# Patient Record
Sex: Female | Born: 1935 | Hispanic: No | Smoking: Never smoker
Health system: Southern US, Community
[De-identification: ages and names within clinical notes are randomized; demographics above are authoritative.]

## PROBLEM LIST (undated history)

## (undated) DIAGNOSIS — T82198A Other mechanical complication of other cardiac electronic device, initial encounter: Secondary | ICD-10-CM

## (undated) DIAGNOSIS — M1990 Unspecified osteoarthritis, unspecified site: Secondary | ICD-10-CM

## (undated) DIAGNOSIS — E785 Hyperlipidemia, unspecified: Secondary | ICD-10-CM

## (undated) DIAGNOSIS — I482 Chronic atrial fibrillation, unspecified: Secondary | ICD-10-CM

## (undated) DIAGNOSIS — Z95810 Presence of automatic (implantable) cardiac defibrillator: Secondary | ICD-10-CM

## (undated) DIAGNOSIS — I428 Other cardiomyopathies: Secondary | ICD-10-CM

## (undated) DIAGNOSIS — I2510 Atherosclerotic heart disease of native coronary artery without angina pectoris: Secondary | ICD-10-CM

## (undated) DIAGNOSIS — I509 Heart failure, unspecified: Secondary | ICD-10-CM

## (undated) DIAGNOSIS — T7840XA Allergy, unspecified, initial encounter: Secondary | ICD-10-CM

## (undated) HISTORY — DX: Presence of automatic (implantable) cardiac defibrillator: Z95.810

## (undated) HISTORY — DX: Hyperlipidemia, unspecified: E78.5

## (undated) HISTORY — PX: OTHER SURGICAL HISTORY: SHX169

## (undated) HISTORY — DX: Allergy, unspecified, initial encounter: T78.40XA

## (undated) HISTORY — DX: Heart failure, unspecified: I50.9

## (undated) HISTORY — DX: Other mechanical complication of other cardiac electronic device, initial encounter: T82.198A

## (undated) HISTORY — DX: Chronic atrial fibrillation, unspecified: I48.20

## (undated) HISTORY — PX: REPLACEMENT TOTAL KNEE: SUR1224

## (undated) HISTORY — DX: Other cardiomyopathies: I42.8

## (undated) HISTORY — DX: Unspecified osteoarthritis, unspecified site: M19.90

## (undated) HISTORY — DX: Atherosclerotic heart disease of native coronary artery without angina pectoris: I25.10

---

## 2005-12-31 ENCOUNTER — Other Ambulatory Visit: Payer: Self-pay

## 2005-12-31 ENCOUNTER — Inpatient Hospital Stay: Payer: Self-pay | Admitting: Internal Medicine

## 2006-01-04 ENCOUNTER — Ambulatory Visit: Payer: Self-pay | Admitting: Internal Medicine

## 2006-01-04 ENCOUNTER — Inpatient Hospital Stay (HOSPITAL_COMMUNITY): Admission: AD | Admit: 2006-01-04 | Discharge: 2006-01-06 | Payer: Self-pay | Admitting: Internal Medicine

## 2006-01-05 HISTORY — PX: OTHER SURGICAL HISTORY: SHX169

## 2006-01-29 ENCOUNTER — Ambulatory Visit: Payer: Self-pay

## 2006-03-28 ENCOUNTER — Other Ambulatory Visit: Payer: Self-pay

## 2006-03-28 ENCOUNTER — Emergency Department: Payer: Self-pay | Admitting: Emergency Medicine

## 2006-04-17 ENCOUNTER — Ambulatory Visit: Payer: Self-pay | Admitting: Internal Medicine

## 2006-07-31 ENCOUNTER — Ambulatory Visit: Payer: Self-pay | Admitting: Internal Medicine

## 2006-10-30 ENCOUNTER — Ambulatory Visit: Payer: Self-pay | Admitting: Internal Medicine

## 2007-01-29 ENCOUNTER — Ambulatory Visit: Payer: Self-pay | Admitting: Internal Medicine

## 2007-04-30 ENCOUNTER — Ambulatory Visit: Payer: Self-pay | Admitting: Internal Medicine

## 2007-06-06 ENCOUNTER — Ambulatory Visit: Payer: Self-pay | Admitting: Internal Medicine

## 2007-06-25 ENCOUNTER — Ambulatory Visit: Payer: Self-pay | Admitting: Gastroenterology

## 2007-07-30 ENCOUNTER — Ambulatory Visit: Payer: Self-pay | Admitting: Internal Medicine

## 2007-07-31 ENCOUNTER — Ambulatory Visit: Payer: Self-pay | Admitting: General Surgery

## 2007-08-08 ENCOUNTER — Ambulatory Visit: Payer: Self-pay | Admitting: Cardiology

## 2007-08-14 ENCOUNTER — Inpatient Hospital Stay: Payer: Self-pay | Admitting: General Surgery

## 2007-10-29 ENCOUNTER — Ambulatory Visit: Payer: Self-pay | Admitting: Internal Medicine

## 2008-01-01 ENCOUNTER — Inpatient Hospital Stay: Payer: Self-pay | Admitting: Cardiology

## 2008-01-01 ENCOUNTER — Other Ambulatory Visit: Payer: Self-pay

## 2008-01-28 ENCOUNTER — Ambulatory Visit: Payer: Self-pay | Admitting: Internal Medicine

## 2008-04-27 ENCOUNTER — Ambulatory Visit: Payer: Self-pay | Admitting: Internal Medicine

## 2008-08-07 ENCOUNTER — Ambulatory Visit: Payer: Self-pay | Admitting: Internal Medicine

## 2008-11-03 ENCOUNTER — Ambulatory Visit: Payer: Self-pay | Admitting: Internal Medicine

## 2008-11-03 ENCOUNTER — Ambulatory Visit: Payer: Self-pay | Admitting: Unknown Physician Specialty

## 2008-11-24 ENCOUNTER — Encounter: Payer: Self-pay | Admitting: Internal Medicine

## 2009-02-02 ENCOUNTER — Ambulatory Visit: Payer: Self-pay | Admitting: Internal Medicine

## 2009-02-09 ENCOUNTER — Ambulatory Visit: Payer: Self-pay | Admitting: General Practice

## 2009-02-12 ENCOUNTER — Encounter: Payer: Self-pay | Admitting: Internal Medicine

## 2009-02-24 ENCOUNTER — Ambulatory Visit: Payer: Self-pay | Admitting: General Practice

## 2009-05-10 ENCOUNTER — Ambulatory Visit: Payer: Self-pay | Admitting: Internal Medicine

## 2009-05-10 DIAGNOSIS — I4891 Unspecified atrial fibrillation: Secondary | ICD-10-CM | POA: Insufficient documentation

## 2009-05-10 DIAGNOSIS — I420 Dilated cardiomyopathy: Secondary | ICD-10-CM

## 2009-05-10 DIAGNOSIS — Z95810 Presence of automatic (implantable) cardiac defibrillator: Secondary | ICD-10-CM | POA: Insufficient documentation

## 2009-05-10 DIAGNOSIS — I5022 Chronic systolic (congestive) heart failure: Secondary | ICD-10-CM

## 2009-05-10 HISTORY — DX: Presence of automatic (implantable) cardiac defibrillator: Z95.810

## 2009-08-10 ENCOUNTER — Encounter: Payer: Self-pay | Admitting: Internal Medicine

## 2009-08-10 ENCOUNTER — Ambulatory Visit: Payer: Self-pay | Admitting: Internal Medicine

## 2009-08-23 ENCOUNTER — Encounter: Payer: Self-pay | Admitting: Internal Medicine

## 2009-11-09 ENCOUNTER — Ambulatory Visit: Payer: Self-pay | Admitting: Internal Medicine

## 2009-11-23 ENCOUNTER — Encounter: Payer: Self-pay | Admitting: Internal Medicine

## 2010-02-08 ENCOUNTER — Ambulatory Visit: Payer: Self-pay | Admitting: Internal Medicine

## 2010-02-24 ENCOUNTER — Encounter: Payer: Self-pay | Admitting: Internal Medicine

## 2010-05-13 ENCOUNTER — Ambulatory Visit: Payer: Self-pay | Admitting: Internal Medicine

## 2010-05-13 ENCOUNTER — Encounter: Payer: Self-pay | Admitting: Internal Medicine

## 2010-08-11 ENCOUNTER — Ambulatory Visit: Payer: Self-pay | Admitting: Internal Medicine

## 2010-09-06 ENCOUNTER — Encounter (INDEPENDENT_AMBULATORY_CARE_PROVIDER_SITE_OTHER): Payer: Self-pay | Admitting: *Deleted

## 2010-09-08 ENCOUNTER — Ambulatory Visit
Admission: RE | Admit: 2010-09-08 | Discharge: 2010-09-08 | Payer: Self-pay | Source: Home / Self Care | Attending: Internal Medicine | Admitting: Internal Medicine

## 2010-09-09 ENCOUNTER — Encounter: Payer: Self-pay | Admitting: Internal Medicine

## 2010-09-27 NOTE — Cardiovascular Report (Signed)
Summary: Office Visit Remote   Office Visit Remote   Imported By: Roderic Ovens 11/25/2009 12:03:00  _____________________________________________________________________  External Attachment:    Type:   Image     Comment:   External Document

## 2010-09-27 NOTE — Letter (Signed)
Summary: Remote Device Check  [facility], Main Office  [address]   [place], [place] [address]   Phone: [phone]  Fax: [phone]     February 24, 2010 MRN: [id number]   Barbara Blackburn [address] [place], [place]  [address]   Dear [name],   Your remote transmission was recieved and reviewed by your physician.  All diagnostics were within normal limits for you.   __X____Your next office visit is scheduled for:  September 2011 with Dr [name].                             Please call our office to schedule an appointment.    Sincerely,  Vella Kohler

## 2010-09-27 NOTE — Cardiovascular Report (Signed)
Summary: Office Visit Remote   Office Visit Remote   Imported By: Roderic Ovens 02/25/2010 16:34:06  _____________________________________________________________________  External Attachment:    Type:   Image     Comment:   External Document

## 2010-09-27 NOTE — Letter (Signed)
Summary: Remote Device Check  [facility], Main Office  [address]   [place], [place] [address]   Phone: [phone]  Fax: [phone]     November 23, 2009 MRN: [id number]   Barbara Blackburn [address] [place], [place]  [address]   Dear [name],   Your remote transmission was recieved and reviewed by your physician.  All diagnostics were within normal limits for you.  __X___Your next transmission is scheduled for:   February 08, 2010.  Please transmit at any time this day.  If you have a wireless device your transmission will be sent automatically.      Sincerely,  Proofreader

## 2010-09-27 NOTE — Assessment & Plan Note (Signed)
Summary: 1 yr   Visit Type:  Follow-up Primary Provider:  Loraine Leriche  Crissman,M.D.  CC:  c/o feel very weak.[name]  History of Present Illness: [name] returns today for ICD followup.  She is a 77 yo woman with an ICM/DCM and EF 25%.  She is s/p PCI stent.  She underwent prophylactic ICD implant in 2007.  She has ongoing pain in her lower back.  She denies c/p, palpitations and sob.  She has had no intercurrent ICD therapies.  She is following a low sodium diet.  Current Medications (verified): 1)  Metformin Hcl 500 Mg Tabs (Metformin Hcl) .... Three Times A Day 2)  Centrum Silver  Tabs (Multiple Vitamins-Minerals) .... Once Daily 3)  Crestor 10 Mg Tabs (Rosuvastatin Calcium) .... Take One Tablet By Mouth Daily. 4)  Exforge 10-320 Mg Tabs (Amlodipine Besylate-Valsartan) .... Once Daily 5)  Coumadin 2 Mg Tabs (Warfarin Sodium) .... Take Tues and Thurs. 6)  Fish Oil 1000 Mg Caps (Omega-3 Fatty Acids) .[name].. 1 Daily 7)  Lopressor 100 Mg Tabs (Metoprolol Tartrate) .... Take 1 By Mouth Two Times A Day 8)  Aspirin 81 Mg Tbec (Aspirin) .... Take One Tablet By Mouth Daily 9)  Hydrochlorothiazide 25 Mg Tabs (Hydrochlorothiazide) .... Take One Tablet By Mouth Daily. 10)  Klor-Con 20 Meq Pack (Potassium Chloride) .... Take 1 By Mouth Once Daily 11)  Coumadin 4 Mg Tabs (Warfarin Sodium) .... Mon. Wed. Fri. Sat. and Sunday  Allergies (verified): 1)  ! * Clonazepam 2)  ! Prozac 3)  ! Lisinopril 4)  ! * Ambien  Past History:  Past Medical History: Last updated: 05/10/2009 1. Medtronic dual-chamber ICD (01/05/2006) 2. Nonischemic cardiomyopathy.  3. Coronary artery disease, now on Plavix.  4. Chronic atrial fibrillation, on Coumadin.  5. Congestive heart failure class II.  6. Severe degenerative joint disease. 7. Dyslipidemia  Review of Systems  The patient denies chest pain, syncope, dyspnea on exertion, and peripheral edema.    Vital Signs:  Patient profile:   77 year old female Height:       62 inches Weight:      202 pounds BMI:     37 .08 Pulse rate:   64 / minute BP sitting:   124 / 76  (left arm) Cuff size:   large  Vitals Entered By: Bishop Dublin, CMA (May 13, 2010 10:01 AM)  Physical Exam  General:  Elderly appearing woman NAD Head:  normocephalic and atraumatic Eyes:  PERRLA/EOM intact; conjunctiva and lids normal. Mouth:  Teeth, gums and palate normal. Oral mucosa normal. Neck:  Neck supple, no JVD. No masses, thyromegaly or abnormal cervical nodes. Chest Wall:  Well healed ICD incision. Lungs:  Clear bilaterally with no wheezes, rales, or rhonchi or increased work of breathing. Heart:  IRIR with normal S1 and S2.  PMI is enlarged and laterally displaced.  No murmurs. Abdomen:  Bowel sounds positive; abdomen soft and non-tender without masses, organomegaly, or hernias noted. No hepatosplenomegaly. Msk:  Back normal, normal gait. Muscle strength and tone decreased in the lower extremities. Pulses:  pulses normal in all 4 extremities Extremities:  No clubbing or cyanosis.  No peripheral edema. Neurologic:  Alert and oriented x 3.    ICD Specifications Following MD:  Lewayne Bunting, MD     ICD Vendor:  Medtronic     ICD Model Number:  7278     ICD Serial Number:  [id number] ICD DOI:  01/05/2006     ICD Implanting MD:  Lewayne Bunting,  MD  Lead 1:    Location: RA     DOI: 01/05/2006     Model #: 2595     Serial #: [id number]     Status: active Lead 2:    Location: RV     DOI: 01/05/2006     Model #: [id number]     Serial #: [id number]     Status: active  Indications::  NICM   ICD Follow Up Remote Check?  No Battery Voltage:  2.65 V     Charge Time:  11.08 seconds     Underlying rhythm:  A-fib ICD Dependent:  No       ICD Device Measurements Atrium:  Amplitude: 0.5 mV, Impedance: 424 ohms,  Right Ventricle:  Amplitude: 3.7 mV, Impedance: 544 ohms, Threshold: 1.0 V at 0.1 msec Shock Impedance: 44/52 ohms   Episodes Percent Mode Switch:  100%     Coumadin:   Yes Shock:  0     ATP:  0     Nonsustained:  0     Atrial Pacing:  0.2%     Ventricular Pacing:  9.0%  Brady Parameters Mode DDI     Lower Rate Limit:  50      Tachy Zones VF:  200     VT:  250     VT1:  171     Next Remote Date:  08/11/2010     Next Cardiology Appt Due:  04/29/2011 Tech Comments:  No parameter changes.  A-fib chronic, + coumadin.  R-waves chronic @ 3.40mV.  6949 lead stable, SIC  0.  Updated letter and magnet given to the patient.  Alert tones demonstrated.   [facility]link transmissions every 3 months.  ROV 1 year with Dr. [name] in [place]. Altha Harm, LPN  May 13, 2010 10:24 AM  MD Comments:  Agree with above.  Impression & Recommendations:  Problem # 1:  AUTOMATIC IMPLANTABLE CARDIAC DEFIBRILLATOR SITU (ICD-V45.02) Her device is working normally and she has had no ICD therapies.  Will follow.  Problem # 2:  CHRONIC SYSTOLIC HEART FAILURE (ICD-428.22) Her symptoms remain class 2.  Continue meds as below and maintain a low sodium diet. Her updated medication list for this problem includes:    Exforge 10-320 Mg Tabs (Amlodipine besylate-valsartan) ..... Once daily    Coumadin 2 Mg Tabs (Warfarin sodium) .[name]... Take tues and thurs.    Lopressor 100 Mg Tabs (Metoprolol tartrate) .[name]... Take 1 by mouth two times a day    Aspirin 81 Mg Tbec (Aspirin) .[name]... Take one tablet by mouth daily    Hydrochlorothiazide 25 Mg Tabs (Hydrochlorothiazide) .[name]... Take one tablet by mouth daily.    Coumadin 4 Mg Tabs (Warfarin sodium) ..... Mon. wed. fri. sat. and sunday  Problem # 3:  CARDIOMYOPATHY, ISCHEMIC (ICD-414.8) She denies anginal symptoms.  Continue meds as below. Her updated medication list for this problem includes:    Exforge 10-320 Mg Tabs (Amlodipine besylate-valsartan) ..... Once daily    Coumadin 2 Mg Tabs (Warfarin sodium) .[name]... Take tues and thurs.    Lopressor 100 Mg Tabs (Metoprolol tartrate) .[name]... Take 1 by mouth two times a day    Aspirin 81 Mg Tbec (Aspirin)  .[name]... Take one tablet by mouth daily    Hydrochlorothiazide 25 Mg Tabs (Hydrochlorothiazide) .[name]... Take one tablet by mouth daily.    Coumadin 4 Mg Tabs (Warfarin sodium) ..... Mon. wed. fri. sat. and sunday

## 2010-09-29 NOTE — Cardiovascular Report (Signed)
Summary: Office Visit Remote   Office Visit Remote   Imported By: Roderic Ovens 09/09/2010 10:53:04  _____________________________________________________________________  External Attachment:    Type:   Image     Comment:   External Document

## 2010-09-29 NOTE — Letter (Signed)
Summary: Remote Device Check  [facility], Main Office  [address]   [place], [place] [address]   Phone: [phone]  Fax: [phone]     September 06, 2010 MRN: [id number]   Barbara Blackburn [address] [place], [place]  [address]   Dear [name],   Your remote transmission was recieved and reviewed by your physician.  All diagnostics were within normal limits for you.  __X___Your next transmission is scheduled for:  09-08-10 .  Please transmit at any time this day.  If you have a wireless device your transmission will be sent automatically.   Sincerely,  Vella Kohler

## 2010-10-09 ENCOUNTER — Encounter (INDEPENDENT_AMBULATORY_CARE_PROVIDER_SITE_OTHER): Payer: Self-pay | Admitting: *Deleted

## 2010-10-17 ENCOUNTER — Encounter: Payer: Self-pay | Admitting: Internal Medicine

## 2010-10-17 ENCOUNTER — Encounter (INDEPENDENT_AMBULATORY_CARE_PROVIDER_SITE_OTHER): Payer: Self-pay | Admitting: *Deleted

## 2010-10-17 ENCOUNTER — Encounter (INDEPENDENT_AMBULATORY_CARE_PROVIDER_SITE_OTHER): Payer: Self-pay

## 2010-10-17 DIAGNOSIS — I428 Other cardiomyopathies: Secondary | ICD-10-CM

## 2010-10-19 ENCOUNTER — Telehealth: Payer: Self-pay | Admitting: Internal Medicine

## 2010-10-19 NOTE — Cardiovascular Report (Signed)
Summary: Office Visit Remote   Office Visit Remote   Imported By: Roderic Ovens 10/11/2010 15:09:28  _____________________________________________________________________  External Attachment:    Type:   Image     Comment:   External Document

## 2010-10-19 NOTE — Letter (Signed)
Summary: Remote Device Check  [facility], Main Office  [address]   [place], [place] [address]   Phone: [phone]  Fax: [phone]     October 09, 2010 MRN: [id number]   Barbara Blackburn [address] [place], [place]  [address]   Dear [name],   Your remote transmission was recieved and reviewed by your physician.  All diagnostics were within normal limits for you.  __X___Your next transmission is scheduled for: 10-13-2010.  Please transmit at any time this day.  If you have a wireless device your transmission will be sent automatically.      Sincerely,  Vella Kohler

## 2010-10-25 NOTE — Letter (Signed)
Summary: Device-Delinquent Phone [facility], [facility], Main Office  [address]   [place], [place] [address]   Phone: [phone]  Fax: [phone]     October 17, 2010 MRN: [id number]   Barbara Blackburn [address] [place], [place]  [address]   Dear [name],  According to our records, you were scheduled for a device phone transmission on 10-13-2010.     We did not receive any results from this check.  If you transmitted on your scheduled day, please call us to help troubleshoot your system.  If you forgot to send your transmission, please send one upon receipt of this letter.  Thank you,   Architectural technologist [facility]

## 2010-10-25 NOTE — Progress Notes (Signed)
Summary: question about transmission  Phone Note Call from Patient Call back at Home Phone [phone]   Caller: Patient Summary of Call: Question about the letter regarding transmission Initial call taken by: Judie Grieve,  October 19, 2010 2:31 PM  Follow-up for Phone Call        spoke w/pt about carelink transmissions. pt's transmission was received. device at [facility].  will schedule ov with gt in [place] office and call pt with appt. Vella Kohler  October 19, 2010 3:20 PM

## 2010-11-08 NOTE — Cardiovascular Report (Signed)
Summary: Office Visit   Office Visit   Imported By: Roderic Ovens 11/04/2010 15:01:12  _____________________________________________________________________  External Attachment:    Type:   Image     Comment:   External Document

## 2010-11-22 ENCOUNTER — Encounter: Payer: Self-pay | Admitting: Internal Medicine

## 2010-11-22 ENCOUNTER — Encounter: Payer: Self-pay | Admitting: *Deleted

## 2010-11-22 ENCOUNTER — Ambulatory Visit (INDEPENDENT_AMBULATORY_CARE_PROVIDER_SITE_OTHER): Payer: PRIVATE HEALTH INSURANCE | Admitting: Internal Medicine

## 2010-11-22 DIAGNOSIS — I2589 Other forms of chronic ischemic heart disease: Secondary | ICD-10-CM

## 2010-11-22 DIAGNOSIS — I428 Other cardiomyopathies: Secondary | ICD-10-CM

## 2010-11-22 DIAGNOSIS — I5022 Chronic systolic (congestive) heart failure: Secondary | ICD-10-CM

## 2010-11-22 DIAGNOSIS — Z95810 Presence of automatic (implantable) cardiac defibrillator: Secondary | ICD-10-CM

## 2010-11-22 DIAGNOSIS — I4891 Unspecified atrial fibrillation: Secondary | ICD-10-CM

## 2010-11-22 MED ORDER — METOPROLOL TARTRATE 100 MG PO TABS
100.0000 mg | ORAL_TABLET | Freq: Two times a day (BID) | ORAL | Status: DC
Start: 2010-11-22 — End: 2012-05-14

## 2010-11-22 NOTE — Progress Notes (Signed)
HPI [name] returns today for ICD followup. She is a 77 yo woman with an ICM/DCM and EF 25%. She is s/p PCI stent. She underwent prophylactic ICD implant in 2007.  She denies c/p, palpitations and sob. She has had no intercurrent ICD therapies. She is following a low sodium diet. She has reached ERI.  Allergies  Allergen Reactions  . Clonazepam   . Fluoxetine Hcl   . Lisinopril   . Zolpidem Tartrate      Current Outpatient Prescriptions  Medication Sig Dispense Refill  . amLODipine-valsartan (EXFORGE) 10-320 MG per tablet Take 1 tablet by mouth daily.        [name] aspirin 81 MG EC tablet Take 81 mg by mouth daily.        . fish oil-omega-3 fatty acids 1000 MG capsule Take 1 g by mouth daily.        . furosemide (LASIX) 40 MG tablet Take 40 mg by mouth 1 dose over 46 hours.        . Multiple Vitamins-Minerals (CENTRUM SILVER ULTRA WOMENS PO) Take 1 tablet by mouth daily.       . potassium chloride SA (K-DUR,KLOR-CON) 20 MEQ tablet Take 20 mEq by mouth daily.        . rosuvastatin (CRESTOR) 10 MG tablet Take 10 mg by mouth daily.        [name] warfarin (COUMADIN) 2 MG tablet Take 2 mg by mouth as directed.        . warfarin (COUMADIN) 4 MG tablet Take 4 mg by mouth as directed.        [name] DISCONTD: metoprolol (LOPRESSOR) 100 MG tablet Take 100 mg by mouth 2 (two) times daily.       . hydrochlorothiazide 25 MG tablet Take 25 mg by mouth daily.       [name] DISCONTD: metFORMIN (GLUCOPHAGE) 500 MG tablet Take 500 mg by mouth 3 (three) times daily.          Past Medical History  Diagnosis Date  . Nonischemic cardiomyopathy   . Coronary artery disease   . Chronic atrial fibrillation   . CHF (congestive heart failure)     class 2  . Degenerative joint disease     severe  . Dyslipidemia     ROS:   All systems reviewed and negative except as noted in the HPI.   Past Surgical History  Procedure Date  . Medtronic dual-chamber icd 01/05/2006  . Pci stent      Family History  Problem  Relation Age of Onset  . Heart attack Father      History   Social History  . Marital Status: Married    Spouse Name: N/A    Number of Children: N/A  . Years of Education: N/A   Occupational History  . Not on file.   Social History Main Topics  . Smoking status: Never Smoker   . Smokeless tobacco: Never Used  . Alcohol Use: No  . Drug Use: No  . Sexually Active: Not on file   Other Topics Concern  . Not on file   Social History Narrative  . No narrative on file     BP 140/80  Pulse 65  Ht 5\' 2"  (1.575 m)  Wt 197 lb (89.359 kg)  BMI 36.03 kg/m2  Physical Exam:  Well appearing NAD HEENT: Unremarkable Neck:  No JVD, no thyromegally Lymphatics:  No adenopathy Back:  No CVA tenderness Lungs:  Clear. Well healed ICD  incision.  HEART:  Regular rate rhythm, no murmurs, no rubs, no clicks Abd:  Flat, positive bowel sounds, no organomegally, no rebound, no guarding Ext:  2 plus pulses, no edema, no cyanosis, no clubbing Skin:  No rashes no nodules Neuro:  CN II through XII intact, motor grossly intact  DEVICE  Normal device function.  See PaceArt for details.ERI.   Assess/Plan:

## 2010-11-22 NOTE — Patient Instructions (Addendum)
Pt was notified that her letter stated Pacemaker Insertion, however, she understands that this is an ICD changeout.

## 2010-11-22 NOTE — Assessment & Plan Note (Signed)
Her symptoms remain class 2. She will continue her current meds and maintain a low sodium diet.  

## 2010-11-22 NOTE — Assessment & Plan Note (Signed)
Her device has reached ERI. I have asked her to return for ICD generator change. This will be scheduled in the next few weeks. As she has a rate/sense PPM lead in place, I plan to use this if working properly in place of the rate/sense portion of her [phone] lead which has an increased risk of breaking. It is also likely that he vein will be occluded.

## 2010-11-22 NOTE — Assessment & Plan Note (Signed)
Her ventricular rates appear to be well controlled. She will continue her current meds.

## 2010-11-29 ENCOUNTER — Encounter: Payer: Self-pay | Admitting: *Deleted

## 2010-12-19 ENCOUNTER — Other Ambulatory Visit: Payer: Self-pay | Admitting: Internal Medicine

## 2010-12-19 DIAGNOSIS — Z01810 Encounter for preprocedural cardiovascular examination: Secondary | ICD-10-CM

## 2010-12-20 ENCOUNTER — Other Ambulatory Visit (INDEPENDENT_AMBULATORY_CARE_PROVIDER_SITE_OTHER): Payer: PRIVATE HEALTH INSURANCE | Admitting: *Deleted

## 2010-12-20 DIAGNOSIS — Z01810 Encounter for preprocedural cardiovascular examination: Secondary | ICD-10-CM

## 2010-12-20 DIAGNOSIS — I509 Heart failure, unspecified: Secondary | ICD-10-CM

## 2010-12-20 LAB — BASIC METABOLIC PANEL
BUN: 12 mg/dL (ref 6–23)
Calcium: 10 mg/dL (ref 8.4–10.5)
Chloride: 104 mEq/L (ref 96–112)
Sodium: 142 mEq/L (ref 135–145)

## 2010-12-21 LAB — CBC WITH DIFFERENTIAL/PLATELET
Basophils Relative: 1 % (ref 0–1)
Eosinophils Absolute: 0.1 10*3/uL (ref 0.0–0.7)
Eosinophils Relative: 1 % (ref 0–5)
HCT: 48.1 % — ABNORMAL HIGH (ref 36.0–46.0)
Lymphocytes Relative: 26 % (ref 12–46)
MCH: 29.5 pg (ref 26.0–34.0)
MCHC: 33.1 g/dL (ref 30.0–36.0)
MCV: 89.2 fL (ref 78.0–100.0)
Neutro Abs: 5.7 10*3/uL (ref 1.7–7.7)
Neutrophils Relative %: 66 % (ref 43–77)
RBC: 5.39 MIL/uL — ABNORMAL HIGH (ref 3.87–5.11)

## 2010-12-21 LAB — APTT: aPTT: 32 seconds (ref 24–37)

## 2010-12-22 ENCOUNTER — Ambulatory Visit (HOSPITAL_COMMUNITY)
Admission: RE | Admit: 2010-12-22 | Discharge: 2010-12-22 | Disposition: A | Discharge status: Home / Self Care | Payer: Medicare Other | Source: Ambulatory Visit | Attending: Internal Medicine | Admitting: Internal Medicine

## 2010-12-22 DIAGNOSIS — I509 Heart failure, unspecified: Secondary | ICD-10-CM | POA: Insufficient documentation

## 2010-12-22 DIAGNOSIS — I4891 Unspecified atrial fibrillation: Secondary | ICD-10-CM | POA: Insufficient documentation

## 2010-12-22 DIAGNOSIS — Z4502 Encounter for adjustment and management of automatic implantable cardiac defibrillator: Secondary | ICD-10-CM | POA: Insufficient documentation

## 2010-12-22 DIAGNOSIS — I2589 Other forms of chronic ischemic heart disease: Secondary | ICD-10-CM

## 2010-12-22 LAB — PROTIME-INR: INR: 1.7 — ABNORMAL HIGH (ref 0.00–1.49)

## 2010-12-22 LAB — SURGICAL PCR SCREEN: MRSA, PCR: NEGATIVE

## 2010-12-26 NOTE — Op Note (Signed)
NAME:  LOREN, VICENS             ACCOUNT NO.:  [id number]  MEDICAL RECORD NO.:  [id number]           PATIENT TYPE:  O  LOCATION:  [facility]                         FACILITY:  [facility]  PHYSICIAN:  Doylene Canning. [name], MD    DATE OF BIRTH:  May 18, 1929  DATE OF PROCEDURE:  12/22/2010 DATE OF DISCHARGE:  12/22/2010                              OPERATIVE REPORT   PROCEDURE PERFORMED:  Removal of previously implanted dual-chamber ICD which had reached ERI and insertion of a new dual-chamber ICD with capping of the rate sense portion of the initial ICD lead and utilization of the previously placed RV pacing lead for rate sensing of the device.  INTRODUCTION:  The patient is a 77 year old woman with ischemic cardiomyopathy and congestive heart failure and atrial fibrillation who is status post ICD insertion.  Her device reached ERI.  She is now referred for ICD generator change.  PROCEDURE:  After informed consent was obtained, the patient was takento the diagnostic EP lab in a fasting state.  After usual preparation and draping, intravenous fentanyl and midazolam was given for sedation. A 30 mL of lidocaine was infiltrated into the left infraclavicular region.  A 6-cm incision was carried out over this region. Electrocautery was utilized to dissect down to the ICD pocket.  After the pocket was entered, electrocautery was utilized to free up the dense fibrous adhesions surrounding the generator and its leads.  At this point, the device was removed with gentle traction.  The leads were evaluated.  The defibrillator lead which was a [phone] Medtronic lead was working normally.  The atrial lead was also working satisfactorily, although the patient's underlying rhythm was atrial fib.  The previously implanted rate sense ventricular lead which was a Medtronic [id number] pass fixation lead was found to have R-waves of approximately 4 mV but stable pacing thresholds and impedances.  This lead was selected to be  used as the rate sense portion of the defibrillator lead and the 6949 rate sense portion was capped.  At this point, the new Medtronic dual-chamber ICD, serial number [id number] was connected to the atrial lead.  The rate sense lead and the SVC and RV coils on the old [phone] defibrillator lead. The patient's INR was found to be less than 2 and for this reason, the device was not tested for defibrillation threshold out of concern that we might shock the patient back into sinus rhythm and put her at risk for thromboembolic event in the setting of her subtherapeutic INR.  At this point, the pocket was irrigated and electrocautery was utilized to assure hemostasis and the  incision was closed with 2-0 and 3-0 Vicryl. Benzoin and Steri-Strips were painted on the skin.  Pressure dressings were applied and the patient was returned to her room in satisfactory condition.  COMPLICATIONS:  There were no immediate procedure complications.  RESULTS:  Demonstrate successful removal of previously implanted dual- chamber ICD insertion of a new dual-chamber device with the old rate sense pacing lead utilized as part of the defibrillator rate sense and circuit.     Doylene Canning. [name], MD     GWT/MEDQ  D:  12/22/2010  T:  12/22/2010  Job:  [id number]  Electronically Signed by Lewayne Bunting MD on 12/26/2010 08:00:04 AM

## 2011-01-02 ENCOUNTER — Ambulatory Visit (INDEPENDENT_AMBULATORY_CARE_PROVIDER_SITE_OTHER): Payer: Medicare Other | Admitting: *Deleted

## 2011-01-02 DIAGNOSIS — I428 Other cardiomyopathies: Secondary | ICD-10-CM

## 2011-01-02 DIAGNOSIS — I4891 Unspecified atrial fibrillation: Secondary | ICD-10-CM

## 2011-01-02 DIAGNOSIS — Z95810 Presence of automatic (implantable) cardiac defibrillator: Secondary | ICD-10-CM

## 2011-01-10 NOTE — Progress Notes (Signed)
[facility] OFFICE NOTE   Barbara Blackburn, EIBEN                      MRN:          [id number]  DATE:04/27/2008                            DOB:          [birth year]    [name] returns today for followup.  She is a very pleasant elderly  woman with the history of nonischemic cardiomyopathy, also with coronary  artery disease who underwent recent stenting of her coronary artery back  in May.  She returns today for followup.  In the interim, she has been  stable.  She has very minimal amounts of chest pain.  She denies  palpitations, but does feel dyspnea with exertion which has been fairly  chronic for her.  A 2D echo demonstrated her EF was now 40%.  This has  improved from her EF of 25% previously.  The patient does have problems  with arthritis and states that most of her limitation is based on pain  at her legs and back.   CURRENT MEDICATIONS:  1. Metformin 500 t.i.d.  2. Multiple vitamins.  3. Calcium.  4. Crestor 10 a day.  5. Coumadin 5 a day.  6. Fish oil supplements.  7. Lopressor 100 twice a day.  8. Plavix 75 a day.  9. Aspirin 81 mg daily.   PHYSICAL EXAMINATION:  GENERAL:  She is a pleasant well-appearing  elderly woman, in no distress.  VITAL SIGNS:  Blood pressure was 160/90, the pulse 70 and irregular,  respirations were 18, the weight was not recorded.  NECK:  No jugular venous distention.  LUNGS:  Clear bilaterally to auscultation.  No wheezes, rales, or  rhonchi are present.  There is no increased work of breathing.  CARDIOVASCULAR:  An irregular rate and rhythm with normal S1 and S2.  ABDOMEN:  Soft and nontender.  EXTREMITIES:  No peripheral edema.   Interrogation of her defibrillator demonstrates a Medtronic maximal, the  fibrillation waves were less than a mV, the R-waves were 3.7 mV  (unchanged from prior), impedance is 432 in the A, 552 in the V.  The  threshold with  volt of 0.2 in the ventricle.  The battery voltage was  3.02 volts.  She was 80% V pacing.  Her 6949 lead was functioning  normally.   IMPRESSION:  1. Nonischemic cardiomyopathy.  2. Coronary artery disease, now on Plavix.  3. Chronic atrial fibrillation, on Coumadin.  4. Congestive heart failure class II.  5. Severe degenerative joint disease.   DISCUSSION:  Ms. Schlauch is stable today, though I am a little concerned  about bleeding risk on the combination of aspirin, Plavix, and Coumadin,  but said she has had no active bleeding.  She does note that she had  recently undergone a resection of a polyp without complication.  I have  recommended her that she follow back up with [place] in 1 year.  She will  continue on [facility] followup.     Doylene Canning. [name], MD  Electronically Signed    GWT/MedQ  DD: 04/27/2008  DT: 04/28/2008  Job #: [id number]   cc:   Marcina Millard, MD  Vonita Moss,  M.D. 

## 2011-01-10 NOTE — Assessment & Plan Note (Signed)
[facility]                         ELECTROPHYSIOLOGY OFFICE NOTE   Barbara, Blackburn                      MRN:          [id number]  DATE:04/30/2007                            DOB:          Dec 08, 1928    Barbara Blackburn returns today for followup.  She is a very pleasant woman  with a history of nonischemic cardiomyopathy and syncope, symptomatic  tachy-brady syndrome, who is status post ICD insertion, now in for over  a year and a half.  She returns today for followup.  Her dizziness has  not been a problem.  She denies chest pain.  She denies shortness of  breath.  She does note some weakness.  She has lost some weight in the  last year and notes that her device has migrated slightly.   PHYSICAL EXAMINATION:  GENERAL:  She is a pleasant, well-appearing woman  in no distress.  VITAL SIGNS:  Blood pressure was 138/82, pulse 71 and regular,  respirations 18, weight 142 pounds.  NECK:  Revealed no jugular venous distention.  LUNGS:  Clear bilaterally to auscultation.  No wheezes, rales, or  rhonchi were present.  CARDIOVASCULAR:  Regular rate and rhythm, with normal S1 and S2.  CHEST:  Examination of her defibrillator site showed that it was healed  nicely.  There were no problems.  EXTREMITIES:  Demonstrated no edema.   Interrogation of the defibrillator demonstrates a Medtronic Maximo, with  P and R waves of 1 and 3, the impedance 456 in the atrium, 584 in the  ventricle, the threshold was 1 V at 0.2 in the ventricle, and the  underlying atrial rhythm was atrial fibrillation.  She was programmed  DDI at 50.  She does have Coumadin on.  She is 30% V-paced.  She does  have a 6949 lead which is working normally.   IMPRESSION:  1. Nonischemic cardiomyopathy.  2. Congestive heart failure.  3. Atrial fibrillation.  4. Hypertension.  5. Dyslipidemia.   DISCUSSION:  Overall, Barbara Blackburn is stable.  I have encouraged that she  restart her walking  program.  I will plan to see her back in the office  in one year in our [place] location.     Doylene Canning. [name], MD  Electronically Signed   GWT/MedQ  DD: 04/30/2007  DT: 04/30/2007  Job #: [id number]   cc:   Vonita Moss, M.D.

## 2011-01-13 NOTE — Assessment & Plan Note (Signed)
[facility]                           ELECTROPHYSIOLOGY OFFICE NOTE   Barbara Blackburn, Barbara Blackburn                      MRN:          [id number]  DATE:04/17/2006                            DOB:          Jan 03, 1929    Barbara Blackburn returns today for followup.  She is a very pleasant elderly  woman with a history of symptomatic tachy-brady syndrome as well as a  history of syncope and a nonischemic cardiomyopathy with an EF of 25%.  She  is status post ICD insertion back in May.  She denies chest pain or  shortness of breath.  She has been having problems with some dizziness and  diagnosed with an inner ear problem.   PHYSICAL EXAMINATION:  NECK:  Revealed no jugular venous distention.  LUNGS:  Clear bilaterally to auscultation.  CARDIOVASCULAR:  Reveals an irregular rate and rhythm with normal S1 and S2.  EXTREMITIES:  Demonstrated no edema.   Interrogation of her defibrillator demonstrates a Medtronic Maximo dual  chamber device with fibrillation waves of a mV and R waves of 5.  The  patient impedance was 520 in the atrium, 672 in the ventricle.  The  ventricular threshold was a volt of 0.2.  The battery voltage was 3.19  volts.  Today, we have turned her from DDD to DDI and decreased her output  down to 2.5 at 0.4 in the ventricle and turned her rate down to 50 to  minimize ventricular pacing.   IMPRESSION:  1. Nonischemic cardiomyopathy.  2. Atrial fibrillation.  3. Bradycardia.   DISCUSSION:  Overall, Barbara Blackburn is stable today.  She has requested some  help with sleep and I have given her a 5 mg prescription of Ambien with no  refills.   We will plan to see her back in our [facility] for ICD followup.                                   Doylene Canning. [name], MD   GWT/MedQ  DD:  04/17/2006  DT:  04/17/2006  Job #:  [id number]   cc:   Vonita Moss, MD  Jamse Mead, MD

## 2011-01-13 NOTE — Op Note (Signed)
NAME:  Barbara Blackburn, Barbara Blackburn             ACCOUNT NO.:  [id number]   MEDICAL RECORD NO.:  [id number]          PATIENT TYPE:  INP   LOCATION:  2037                         FACILITY:  [facility]   PHYSICIAN:  Doylene Canning. [name], M.D.  DATE OF BIRTH:  1928/11/17   DATE OF PROCEDURE:  01/05/2006  DATE OF DISCHARGE:                                 OPERATIVE REPORT   PROCEDURE PERFORMED:  Implantation of a dual-chamber ICD.   INDICATIONS:  Atrial fibrillation and bradycardia, syncope and nonischemic  cardiomyopathy, EF of 25%.   INTRODUCTION:  The patient is a 77 year old woman with a history of  bradycardia and atrial fibrillation and nonischemic cardiomyopathy with  congestive heart failure (class II) who is admitted to the hospital after  having recurrent syncopal episodes with her being on a monitor.  She is now  referred for ICD implantation.   PROCEDURE:  After informed consent was obtained, the patient was taken to  the diagnostic EP lab in a fasting state.  After the usual preparation and  draping, intravenous fentanyl and midazolam were given for sedation.  30 mL  of lidocaine was infiltrated over the left infraclavicular region.  A 7 cm  incision was carried out over this region.  Electrocautery was utilized to  dissect down to the fascial plane.  The left subclavian vein was punctured  x2 and the Medtronic model 6949, 65 cm active fixation defibrillation lead,  serial number [id number], was advanced into the right ventricle.  The  Medtronic model 5076, 52 cm active fixation pacing lead, serial number  [id number], was advanced to the right atrium.  Mapping was carried out in  the right ventricle and the final site on the RV septum, the R-waves  measured 8 millivolts and the pacing threshold was 0.4 volts at 0.5  milliseconds with a pacing impedance of 166 ohms.  With the ventricular lead  actively fixed, 10 volt pacing did not stimulate the diaphragm.  Attention  was then turned to placement  of the atrial lead which was placed in the  lateral wall of the right atrium where fibrillation waves measured 2  millivolts and the pacing threshold in the AOO mode was 930 ohms.  With both  atrial and ventricular leads in satisfactory position, they were secured to  the subpectoralis fascia with a silk suture.  The sewing sleeve was also  secured with a silk suture.  Electrocautery was then utilized to make a  subcutaneous pocket.  At this point, the old pacemaker pocket was entered  and the generator was removed gentle traction and the old ventricular pacing  lead was capped.  The device was secured with silk.  The new Medtronic  Maximow DR was connected to the atrial and defibrillation lead, serial  number was [id number], and the device was placed back in the subcutaneous  pocket and secured with a silk suture.  At this point, defibrillation  threshold testing was carried out.   After the patient was deeply sedated with fentanyl and Versed, VF was  induced with a T-wave shock.  15 joules shock was subsequently delivered  which terminated  ventricular ablation.  Atrial fibrillation was maintained.  Five minutes was allowed to elapse and a second VF test carried out.  Again,  VF was induced with a T-wave shock and, again, a 15 joules shock was  delivered which terminated ventricular fibrillation and restored atrial  fibrillation.  No additional defibrillation threshold test was out and the  incision was closed with a layer of 2-0 Vicryl followed by a layer 3-0  Vicryl followed by a layer of 4-0 Vicryl.  Benzoin and Steri-Strips were  applied, and a pressure dressing was placed.  The patient returned to her  room in satisfactory condition.   COMPLICATIONS:  There were no immediate procedure complications.   RESULTS:  Demonstrate successful implantation of a Medtronic dual-chamber  ICD in a patient with nonischemic cardiomyopathy, syncope, bradycardia  status post pacemaker  insertion.           ______________________________  Doylene Canning. [name], M.D.     GWT/MEDQ  D:  01/05/2006  T:  01/05/2006  Job:  [id number]

## 2011-01-13 NOTE — Discharge Summary (Signed)
NAME:  Barbara Blackburn, [name]             ACCOUNT NO.:  [id number]   MEDICAL RECORD NO.:  [id number]          PATIENT TYPE:  INP   LOCATION:  2037                         FACILITY:  [facility]   PHYSICIAN:  Franklin Bing, M.D. [facility] OF BIRTH:  [birth year]   DATE OF ADMISSION:  01/04/2006  DATE OF DISCHARGE:  01/06/2006                                 DISCHARGE SUMMARY   DISCHARGE DIAGNOSES:  1.  Status post syncopal episode in the setting of nonischemic      cardiomyopathy with an ejection fraction of 20% and rate-controlled      atrial fibrillation.  2.  Urinary tract infection, treated with Cipro.   PROCEDURE THIS ADMISSION:  By Dr. Lewayne Bunting with implantation of dual-  chamber ICD on Jan 05, 2006, using a Medtronic, model 6949, 65 cm active  fixation defibrillator lead, serial #[id number], into the right ventricle.  The Medtronic, model 5076, 52 cm active fixation pacing lead, serial  #[id number], was advanced to the right atrium.  Medtronic MAXIMO BR was  connected to the defibrillation lead, serial [id number].   PAST MEDICAL HISTORY:  1.  Syncope.  2.  Ischemic cardiomyopathy with an EF of 20%.  3.  Atrial fibrillation.  4.  Tachy-brady syndrome.  5.  Diabetes.  6.  Nonobstructive coronary artery disease by cath.  7.  Dyslipidemia.  8.  Hypertension.  9.  TIA in 2001.   Barbara Blackburn is a 77 year old Caucasian female with history as stated above,  status post permanent pacemaker, who was transferred from [facility]  secondary to syncope and nonischemic cardiomyopathy. Apparently Barbara Blackburn  was in church and slumped over with loss of consciousness. EMS was notified  and the patient was taken to [facility] emergency  department where she was treated with Dilantin and amiodarone. EKG there  showed ST-segment depression in V4  and V6. The patient ruled out for MI. CT  and carotid ultrasound were performed and both were negative. Cardiac  catheterization was performed on May 10th revealing normal coronary arteries  with an EF of 20%. The patient was transferred to [facility] for EP  evaluation and ICD placement. The patient is seen by Dr. Lewayne Bunting on Jan 04, 2006, with plans for ICD implant on the following day. The patient was  taken to the cath lab on Jan 05, 2006, tolerated the procedure without  complications. Dr. Dietrich Pates was in to see the patient on the following day.  The patient noted to have a UTI, begun treatment with Cipro.  Pacer site  benign. The patient was ventricular pacing. Blood pressure stable at 125/60.  The patient was afebrile. Portable chest x-ray showed no pulmonary embolism.  Chest x-ray showed no pneumothorax. The patient is being discharged home to  follow up  with Dr. [name] on August 21st at 10:30. The patient is to follow  up with the Pacemaker/ICD clinic on May 30th at 9:20. The patient is to  follow up with her primary care physician in [place] as previously  scheduled. The patient instructed not to drive until seen by Dr. [name] on  August 21st. The patient given post pacemaker/defibrillator instructions. No  driving for three months. Tylenol #3 p.r.n. incisional pain.   MEDICATIONS AT DISCHARGE:  1.  Diovan/hydrochlorothiazide 160/2.5 mg daily.  2.  Norvasc 5 mg daily.  3.  Metformin 500 mg daily.  4.  Digitek 0.25 mg daily.  5.  Coumadin 4 mg daily.  6.  Cellcept 20 mg at bedtime.  7.  Toprol-XL 100 mg daily.  8.  Cipro 500 mg p.o. b.i.d. times three days.   Duration of discharge encounter 35 minutes.      Dorian Pod, NP      Franklin Bing, M.D. [facility]  Electronically Signed    MB/MEDQ  D:  02/05/2006  T:  02/05/2006  Job:  [id number]   cc:   Vonita Moss, M.D.  [place], [place]

## 2011-01-13 NOTE — H&P (Signed)
NAME:  Barbara Blackburn, Barbara Blackburn NO.:  [id number]   MEDICAL RECORD NO.:  [id number]          PATIENT TYPE:  INP   LOCATION:  2037                         FACILITY:  [facility]   PHYSICIAN:  Carolanne Grumbling, M.D.    DATE OF BIRTH:  02/24/1929   DATE OF ADMISSION:  01/04/2006  DATE OF DISCHARGE:                                HISTORY & PHYSICAL   PRIMARY CARE PHYSICIAN:  Dr. Dossie Arbour, Jay.   REASON FOR ADMISSION:  A 77 year old, white female with prior history of  atrial fibrillation and tachybrady syndrome, status post pacemaker  placement, who is transferred from [facility] secondary to syncope  and nonischemic cardiomyopathy.   PROBLEM LIST:  1.  Syncope.  2.  Nonischemic cardiomyopathy.  On Jan 04, 2006, cardiac catheterization      with left main 10%, 30%  proximal, 50% mid.  Left circumflex 10% mid and      RCA normal.  EF 20%.  3.  Hypertension.  4.  Hyperlipidemia.  5.  Type 2 diabetes mellitus.  6.  Chronic atrial fibrillation on chronic anticoagulation.  7.  History of mini-stroke in 2001.  8.  Status post hysterectomy with bilateral oophorectomy.  9.  Tachybrady syndrome, status post permanent pacemaker placement.   HISTORY OF PRESENT ILLNESS:  A 77 year old white female with prior history  of atrial fibrillation on chronic anticoagulation and tachybrady syndrome,  status post permanent pacemaker implant.  She was in her usual state of  health until Dec 31, 2005, when she was at church and slumped over with  loss of consciousness.  EMS was called and she was noted to have agonal  respirations with seizure-like activity which improved after 4 mg of IV  Ativan.  She initially was only responsive to sternal rub, but by the time  she presented to the ED, she became more responsive.  In the [place] ED,  she was treated with Dilantin and amiodarone.  An ECG there showed ST-  segment depression in V4-V6.  She was admitted to [facility] and  ruled out  for MI.  A CT and carotid ultrasound were performed and negative.  Cardiac catheterization was performed this morning, May 10, revealing normal  coronary arteries with an EF of approximately 20%.  Decision was made to  transfer to [facility] for EP evaluation and ICD placement.   ALLERGIES:  PROZAC causes nightmares.   MEDICATIONS:  1.  Norvasc 5 mg daily.  2.  Diovan 160 mg daily.  3.  Hydrochlorothiazide 12.5 mg daily.  4.  Lanoxin 0.25 mg daily.  5.  Metformin 500 mg daily.  6.  Zocor 20 mg nightly.  7.  Toprol XL 100 mg daily.  8.  Aspirin 81 mg daily.  9.  Sliding scale insulin a.c.  10. Coumadin which is currently on hold.   FAMILY HISTORY:  Mother died of cancer at age 42.  Father died of coronary  artery disease at 35.  She has two sisters both with hyperlipidemia.   SOCIAL HISTORY:  She lives in [place] by herself.  She is retired.  She is  widowed.  She never smoked cigarettes or used alcohol or drugs.  She does  not routinely exercise.   REVIEW OF SYSTEMS:  Positive for syncope and diabetes.  All other systems  reviewed negative.   PHYSICAL EXAMINATION:  VITAL SIGNS:  Temperature 97.0, heart rate 53,  respirations 18, blood pressure 156/76.  GENERAL:  A pleasant, white female in no acute distress.  Awake, alert and  oriented x3.  NECK:  Normal carotid upstrokes.  No bruits or JVD.  LUNGS:  Respirations regular and unlabored.  Clear to auscultation.  CARDIAC:  Regular S1, S2.  No S3, S4 or murmurs.  ABDOMEN:  Round, soft, nontender, nondistended.  Bowel sounds presents x4.  EXTREMITIES:  Warm, dry and pink.  No clubbing, cyanosis or edema.  Dorsalis  pedis and posterior tibial pulses 1+ bilaterally.   LABORATORY DATA AND X-RAY FINDINGS:  Chest x-ray on May 6, showed  cardiomegaly with clear lung fields.  Head CT on May 7, showed no acute  intracranial abnormality.  Carotid ultrasound on May 7, showed no  hemodynamically significant stenosis.  EKG shows paced rhythm  at 61.   Lab work with hemoglobin 14.1, hematocrit 41.4, WBC 9.4, platelets 199.  Sodium 141, potassium 3.2, chloride 105, CO2 29, BUN 12, creatinine 0.7,  glucose 113.  Total bilirubin 0.6, Alkaline phosphatase 76, AST 47, ALT 66,  total protein 6.4, albumin 3.4.  PT 20.3, INR 1.6.   ASSESSMENT/PLAN:  1.  Syncope/nonischemic cardiomyopathy.  The patient had a normal      catheterization with reportedly abnormal ejection fraction of      approximately 20%.  She was transferred here for evaluation with plans      for implantable cardioverter-defibrillator placement in the morning with      Dr. [name].  Her Coumadin is on hold.  Will check an international      normalized ratio in the morning.  She appears euvolemic on exam.  Will      continue beta-blocker and ARB.  2.  Hypertension, slightly elevated.  Continue home medicines and follow      trend.  Titrated as necessary.  3.  Hyperlipidemia.  Will check fasting lipids.  She had liver function      tests at the outside hospital which were      normal.  Continue Statin therapy.  4.  Atrial fibrillation.  She is rate controlled/paced.  Continue beta      blocker digoxin.  We will hold Coumadin.  5.  Type 2 diabetes mellitus.  Hold metformin continue.  Continue sliding      scale insulin.      Ok Anis, NP      Carolanne Grumbling, M.D.  Electronically Signed    CRB/MEDQ  D:  01/04/2006  T:  01/04/2006  Job:  [id number]

## 2011-05-08 ENCOUNTER — Encounter: Payer: Self-pay | Admitting: Internal Medicine

## 2011-05-12 ENCOUNTER — Encounter: Payer: Self-pay | Admitting: Internal Medicine

## 2011-05-12 ENCOUNTER — Ambulatory Visit (INDEPENDENT_AMBULATORY_CARE_PROVIDER_SITE_OTHER): Payer: Medicare Other | Admitting: Internal Medicine

## 2011-05-12 DIAGNOSIS — Z95810 Presence of automatic (implantable) cardiac defibrillator: Secondary | ICD-10-CM

## 2011-05-12 DIAGNOSIS — I2589 Other forms of chronic ischemic heart disease: Secondary | ICD-10-CM

## 2011-05-12 DIAGNOSIS — I4891 Unspecified atrial fibrillation: Secondary | ICD-10-CM

## 2011-05-12 DIAGNOSIS — I5022 Chronic systolic (congestive) heart failure: Secondary | ICD-10-CM

## 2011-05-12 LAB — ICD DEVICE OBSERVATION
AL AMPLITUDE: 0.8 mv
AL IMPEDENCE ICD: 456 Ohm
BATTERY VOLTAGE: 3.1986 V
BRDY-0004RV: 120 {beats}/min
PACEART VT: 0
RV LEAD AMPLITUDE: 3.1 mv
TOT-0002: 0
TOT-0006: 20120426000000
TZAT-0001ATACH: 3
TZAT-0002FASTVT: NEGATIVE
TZAT-0004SLOWVT: 8
TZAT-0011SLOWVT: 10 ms
TZAT-0011SLOWVT: 10 ms
TZAT-0012ATACH: 150 ms
TZAT-0012SLOWVT: 200 ms
TZAT-0012SLOWVT: 200 ms
TZAT-0013SLOWVT: 2
TZAT-0018ATACH: NEGATIVE
TZAT-0018ATACH: NEGATIVE
TZAT-0018FASTVT: NEGATIVE
TZAT-0018SLOWVT: NEGATIVE
TZAT-0019ATACH: 6 V
TZAT-0020ATACH: 1.5 ms
TZAT-0020ATACH: 1.5 ms
TZAT-0020ATACH: 1.5 ms
TZAT-0020SLOWVT: 1.5 ms
TZAT-0020SLOWVT: 1.5 ms
TZON-0003ATACH: 350 ms
TZON-0003VSLOWVT: 380 ms
TZON-0004VSLOWVT: 28
TZON-0005SLOWVT: 12
TZST-0001ATACH: 4
TZST-0001ATACH: 6
TZST-0001FASTVT: 2
TZST-0001FASTVT: 3
TZST-0001SLOWVT: 3
TZST-0001SLOWVT: 5
TZST-0001SLOWVT: 6
TZST-0002ATACH: NEGATIVE
TZST-0002FASTVT: NEGATIVE
TZST-0002FASTVT: NEGATIVE
TZST-0002FASTVT: NEGATIVE
VENTRICULAR PACING ICD: 34.05 pct
VF: 0

## 2011-05-12 NOTE — Progress Notes (Signed)
HPI Barbara Blackburn returns today for followup. She is a 77 yo woman with a h/o Non-ischemic CM, Class 2 CHF, atrial fibrillation, and is s/p ICD implant. She had her generator changed several months ago. She denies c/p and sob but does have intermittant peripheral edema. She notes an "ache" under her left breast. No/mimimal peripheral edema. She denies syncope. She remains sedentary and is not exercising despite my recommendation.  Allergies  Allergen Reactions  . Clonazepam   . Fluoxetine Hcl   . Lisinopril   . Zolpidem Tartrate      Current Outpatient Prescriptions  Medication Sig Dispense Refill  . amLODipine-valsartan (EXFORGE) 10-320 MG per tablet Take 1 tablet by mouth daily.        [name] aspirin 81 MG EC tablet Take 81 mg by mouth daily.        . Calcium Carbonate-Vitamin D (CALCIUM-VITAMIN D) 500-200 MG-UNIT per tablet Take 1 tablet by mouth 2 (two) times daily with a meal.        . Cholecalciferol (VITAMIN D3) 2000 UNITS TABS Take 1 tablet by mouth daily.        . fish oil-omega-3 fatty acids 1000 MG capsule Take 1 g by mouth daily.        . furosemide (LASIX) 40 MG tablet Take 40 mg by mouth 1 dose over 46 hours.        . metoprolol (LOPRESSOR) 100 MG tablet Take 1 tablet (100 mg total) by mouth 2 (two) times daily.  30 tablet  6  . Multiple Vitamins-Minerals (CENTRUM SILVER ULTRA WOMENS PO) Take 1 tablet by mouth daily.       . potassium chloride SA (K-DUR,KLOR-CON) 20 MEQ tablet Take 20 mEq by mouth daily.        [name] spironolactone (ALDACTONE) 25 MG tablet Take 25 mg by mouth daily.        [name] warfarin (COUMADIN) 2 MG tablet Take 2 mg by mouth as directed.        . warfarin (COUMADIN) 4 MG tablet Take 4 mg by mouth as directed.           Past Medical History  Diagnosis Date  . Nonischemic cardiomyopathy   . Coronary artery disease   . Chronic atrial fibrillation   . CHF (congestive heart failure)     class 2  . Degenerative joint disease     severe  . Dyslipidemia      ROS:   All systems reviewed and negative except as noted in the HPI.   Past Surgical History  Procedure Date  . Medtronic dual-chamber icd 01/05/2006  . Pci stent      Family History  Problem Relation Age of Onset  . Heart attack Father      History   Social History  . Marital Status: Married    Spouse Name: N/A    Number of Children: N/A  . Years of Education: N/A   Occupational History  . Not on file.   Social History Main Topics  . Smoking status: Never Smoker   . Smokeless tobacco: Never Used  . Alcohol Use: No  . Drug Use: No  . Sexually Active: Not on file   Other Topics Concern  . Not on file   Social History Narrative  . No narrative on file     BP 147/69  Pulse 73  Ht 5\' 2"  (1.575 m)  Wt 194 lb (87.998 kg)  BMI 35.48 kg/m2  Physical Exam:  Well appearing  obese elderly woman NAD HEENT: Unremarkable Neck:  No JVD, no thyromegally Lymphatics:  No adenopathy Back:  No CVA tenderness Lungs:  Clear. Well healed ICD incision. HEART:  Iregular rate rhythm, no murmurs, no rubs, no clicks Abd:  Obes, soft, positive bowel sounds, no organomegally, no rebound, no guarding Ext:  2 plus pulses, no edema, no cyanosis, no clubbing Skin:  No rashes no nodules Neuro:  CN II through XII intact, motor grossly intact  DEVICE  Normal device function.  See PaceArt for details.   Assess/Plan:

## 2011-05-12 NOTE — Assessment & Plan Note (Signed)
Her ventricular rate is well controlled. She will continue her current meds. She does not feel palpitations.

## 2011-05-12 NOTE — Patient Instructions (Signed)
Your physician recommends that you schedule a follow-up appointment in: 1 year with Dr. Kennon Rounds 08/10/11

## 2011-05-12 NOTE — Assessment & Plan Note (Signed)
She has class 2 CHF symptoms. She will continue her current meds and maintain a low sodium diet.

## 2011-05-12 NOTE — Assessment & Plan Note (Signed)
Her device is working normally. Will recheck in several months. 

## 2011-08-02 ENCOUNTER — Telehealth: Payer: Self-pay | Admitting: Internal Medicine

## 2011-08-02 NOTE — Telephone Encounter (Signed)
Per Dr [name] May proceed with surgery  Low-moderate risk  Faxed to Dr Ernest Pine [phone]

## 2011-08-02 NOTE — Telephone Encounter (Signed)
New Msg: Pt calling wanting [place] to fax surgical clearance for pt knee replacement surgery on 08/28/11 to Dr. Ernest Pine at [facility] in [place]. Please return pt call to discuss further.   [phone]--UJ. [employer]'s fax number.

## 2011-08-10 ENCOUNTER — Encounter: Payer: Medicare Other | Admitting: *Deleted

## 2011-08-14 ENCOUNTER — Ambulatory Visit: Payer: Self-pay | Admitting: General Practice

## 2011-08-14 ENCOUNTER — Encounter: Payer: Self-pay | Admitting: *Deleted

## 2011-08-17 ENCOUNTER — Other Ambulatory Visit: Payer: Self-pay | Admitting: Internal Medicine

## 2011-08-17 ENCOUNTER — Ambulatory Visit (INDEPENDENT_AMBULATORY_CARE_PROVIDER_SITE_OTHER): Payer: Medicare Other | Admitting: *Deleted

## 2011-08-17 ENCOUNTER — Encounter: Payer: Self-pay | Admitting: Internal Medicine

## 2011-08-17 DIAGNOSIS — I2589 Other forms of chronic ischemic heart disease: Secondary | ICD-10-CM

## 2011-08-17 DIAGNOSIS — I5022 Chronic systolic (congestive) heart failure: Secondary | ICD-10-CM

## 2011-08-24 LAB — REMOTE ICD DEVICE
AL AMPLITUDE: 0.4 mv
ATRIAL PACING ICD: 2.62 pct
BRDY-0002RV: 50 {beats}/min
BRDY-0004RV: 120 {beats}/min
BRDY-0004RV: 120 {beats}/min
CHARGE TIME: 8.718 s
CHARGE TIME: 8.718 s
FVT: 0
PACEART VT: 0
RV LEAD AMPLITUDE: 1.3 mv
RV LEAD IMPEDENCE ICD: 551 Ohm
RV LEAD THRESHOLD: 1.375 V
TOT-0001: 0
TOT-0001: 0
TZAT-0001ATACH: 1
TZAT-0001ATACH: 1
TZAT-0001ATACH: 2
TZAT-0001ATACH: 3
TZAT-0001FASTVT: 1
TZAT-0001FASTVT: 1
TZAT-0001SLOWVT: 1
TZAT-0001SLOWVT: 2
TZAT-0002ATACH: NEGATIVE
TZAT-0002FASTVT: NEGATIVE
TZAT-0004SLOWVT: 8
TZAT-0004SLOWVT: 8
TZAT-0005SLOWVT: 88 pct
TZAT-0011SLOWVT: 10 ms
TZAT-0011SLOWVT: 10 ms
TZAT-0012ATACH: 150 ms
TZAT-0012ATACH: 150 ms
TZAT-0012ATACH: 150 ms
TZAT-0012ATACH: 150 ms
TZAT-0012FASTVT: 200 ms
TZAT-0012SLOWVT: 200 ms
TZAT-0012SLOWVT: 200 ms
TZAT-0013SLOWVT: 2
TZAT-0013SLOWVT: 2
TZAT-0013SLOWVT: 2
TZAT-0013SLOWVT: 2
TZAT-0018ATACH: NEGATIVE
TZAT-0018ATACH: NEGATIVE
TZAT-0018ATACH: NEGATIVE
TZAT-0018ATACH: NEGATIVE
TZAT-0018FASTVT: NEGATIVE
TZAT-0018SLOWVT: NEGATIVE
TZAT-0018SLOWVT: NEGATIVE
TZAT-0018SLOWVT: NEGATIVE
TZAT-0019ATACH: 6 V
TZAT-0019ATACH: 6 V
TZAT-0019ATACH: 6 V
TZAT-0019SLOWVT: 8 V
TZAT-0019SLOWVT: 8 V
TZAT-0020ATACH: 1.5 ms
TZAT-0020ATACH: 1.5 ms
TZAT-0020ATACH: 1.5 ms
TZAT-0020SLOWVT: 1.5 ms
TZAT-0020SLOWVT: 1.5 ms
TZON-0003VSLOWVT: 380 ms
TZON-0004SLOWVT: 24
TZON-0004VSLOWVT: 28
TZON-0005SLOWVT: 12
TZON-0005SLOWVT: 12
TZST-0001ATACH: 5
TZST-0001ATACH: 6
TZST-0001FASTVT: 2
TZST-0001FASTVT: 4
TZST-0001FASTVT: 4
TZST-0001FASTVT: 5
TZST-0001FASTVT: 6
TZST-0001FASTVT: 6
TZST-0001SLOWVT: 4
TZST-0002ATACH: NEGATIVE
TZST-0002ATACH: NEGATIVE
TZST-0002ATACH: NEGATIVE
TZST-0002FASTVT: NEGATIVE
TZST-0002FASTVT: NEGATIVE
TZST-0002FASTVT: NEGATIVE
TZST-0002FASTVT: NEGATIVE
TZST-0002FASTVT: NEGATIVE
TZST-0003SLOWVT: 15 J
TZST-0003SLOWVT: 15 J
TZST-0003SLOWVT: 25 J
VENTRICULAR PACING ICD: 41.11 pct
VF: 0

## 2011-08-25 NOTE — Progress Notes (Signed)
ICD remote with ICM 

## 2011-08-28 ENCOUNTER — Inpatient Hospital Stay: Payer: Self-pay | Admitting: General Practice

## 2011-08-29 LAB — BASIC METABOLIC PANEL
Anion Gap: 11 (ref 7–16)
BUN: 15 mg/dL (ref 7–18)
Calcium, Total: 8.4 mg/dL — ABNORMAL LOW (ref 8.5–10.1)
Chloride: 109 mmol/L — ABNORMAL HIGH (ref 98–107)
Co2: 24 mmol/L (ref 21–32)
Creatinine: 0.71 mg/dL (ref 0.60–1.30)
EGFR (African American): >60

## 2011-08-29 LAB — PROTIME-INR: INR: 1.2

## 2011-08-29 LAB — PLATELET COUNT: Platelet: 140 10*3/uL — ABNORMAL LOW (ref 150–440)

## 2011-08-30 LAB — URINALYSIS, COMPLETE
Bilirubin,UR: NEGATIVE
Ph: 5 (ref 4.5–8.0)
Protein: NEGATIVE
Squamous Epithelial: 43

## 2011-08-30 LAB — HEMOGLOBIN: HGB: 12 g/dL (ref 12.0–16.0)

## 2011-08-30 LAB — PLATELET COUNT: Platelet: 172 10*3/uL (ref 150–440)

## 2011-08-30 LAB — BASIC METABOLIC PANEL
BUN: 12 mg/dL (ref 7–18)
Calcium, Total: 8.7 mg/dL (ref 8.5–10.1)
Chloride: 108 mmol/L — ABNORMAL HIGH (ref 98–107)
Co2: 24 mmol/L (ref 21–32)
EGFR (African American): >60
Glucose: 116 mg/dL — ABNORMAL HIGH (ref 65–99)
Osmolality: 282 (ref 275–301)
Potassium: 4.3 mmol/L (ref 3.5–5.1)

## 2011-08-30 LAB — PROTIME-INR: Prothrombin Time: 19 secs — ABNORMAL HIGH (ref 11.5–14.7)

## 2011-08-31 LAB — PROTIME-INR: INR: 2.2

## 2011-09-10 ENCOUNTER — Emergency Department: Payer: Self-pay | Admitting: Emergency Medicine

## 2011-09-13 ENCOUNTER — Encounter: Payer: Self-pay | Admitting: *Deleted

## 2011-10-09 ENCOUNTER — Ambulatory Visit: Payer: Self-pay

## 2011-11-23 ENCOUNTER — Ambulatory Visit (INDEPENDENT_AMBULATORY_CARE_PROVIDER_SITE_OTHER): Payer: Medicare Other | Admitting: *Deleted

## 2011-11-23 ENCOUNTER — Encounter: Payer: Self-pay | Admitting: Internal Medicine

## 2011-11-23 DIAGNOSIS — I428 Other cardiomyopathies: Secondary | ICD-10-CM

## 2011-11-23 DIAGNOSIS — I4891 Unspecified atrial fibrillation: Secondary | ICD-10-CM

## 2011-11-23 DIAGNOSIS — I5022 Chronic systolic (congestive) heart failure: Secondary | ICD-10-CM

## 2011-11-28 LAB — REMOTE ICD DEVICE
AL AMPLITUDE: 0.5 mv
ATRIAL PACING ICD: 4.11 pct
BATTERY VOLTAGE: 3.1713 V
CHARGE TIME: 8.718 s
DEV-0020ICD: NEGATIVE
FVT: 0
TOT-0006: 20120426000000
TZAT-0001ATACH: 2
TZAT-0002ATACH: NEGATIVE
TZAT-0002FASTVT: NEGATIVE
TZAT-0004SLOWVT: 8
TZAT-0004SLOWVT: 8
TZAT-0005SLOWVT: 91 pct
TZAT-0011SLOWVT: 10 ms
TZAT-0012ATACH: 150 ms
TZAT-0012ATACH: 150 ms
TZAT-0012SLOWVT: 200 ms
TZAT-0013SLOWVT: 2
TZAT-0018ATACH: NEGATIVE
TZAT-0018SLOWVT: NEGATIVE
TZAT-0019ATACH: 6 V
TZAT-0019FASTVT: 8 V
TZAT-0019SLOWVT: 8 V
TZAT-0019SLOWVT: 8 V
TZAT-0020ATACH: 1.5 ms
TZAT-0020FASTVT: 1.5 ms
TZAT-0020SLOWVT: 1.5 ms
TZAT-0020SLOWVT: 1.5 ms
TZON-0003ATACH: 350 ms
TZON-0004SLOWVT: 24
TZON-0004VSLOWVT: 28
TZST-0001ATACH: 4
TZST-0001ATACH: 5
TZST-0001ATACH: 6
TZST-0001FASTVT: 2
TZST-0001FASTVT: 3
TZST-0001FASTVT: 5
TZST-0001FASTVT: 6
TZST-0001SLOWVT: 6
TZST-0002ATACH: NEGATIVE
TZST-0002ATACH: NEGATIVE
TZST-0002FASTVT: NEGATIVE
TZST-0002FASTVT: NEGATIVE
TZST-0002FASTVT: NEGATIVE
TZST-0003SLOWVT: 35 J
VF: 0

## 2011-11-29 NOTE — Progress Notes (Signed)
Remote icd check  W/icm

## 2011-12-06 ENCOUNTER — Encounter: Payer: Self-pay | Admitting: *Deleted

## 2012-03-01 ENCOUNTER — Ambulatory Visit (INDEPENDENT_AMBULATORY_CARE_PROVIDER_SITE_OTHER): Payer: Medicare Other | Admitting: *Deleted

## 2012-03-01 ENCOUNTER — Encounter: Payer: Self-pay | Admitting: Internal Medicine

## 2012-03-01 DIAGNOSIS — I5022 Chronic systolic (congestive) heart failure: Secondary | ICD-10-CM

## 2012-03-01 DIAGNOSIS — Z95810 Presence of automatic (implantable) cardiac defibrillator: Secondary | ICD-10-CM

## 2012-03-01 LAB — REMOTE ICD DEVICE
AL AMPLITUDE: 0.25 mv
AL IMPEDENCE ICD: 399 Ohm
ATRIAL PACING ICD: 8.21 pct
BRDY-0002RV: 50 {beats}/min
BRDY-0004RV: 120 {beats}/min
PACEART VT: 0
RV LEAD AMPLITUDE: 2.125 mv
RV LEAD THRESHOLD: 1 V
TOT-0002: 0
TOT-0006: 20120426000000
TZAT-0001SLOWVT: 1
TZAT-0002ATACH: NEGATIVE
TZAT-0004SLOWVT: 8
TZAT-0004SLOWVT: 8
TZAT-0011SLOWVT: 10 ms
TZAT-0012ATACH: 150 ms
TZAT-0012ATACH: 150 ms
TZAT-0012ATACH: 150 ms
TZAT-0012SLOWVT: 200 ms
TZAT-0012SLOWVT: 200 ms
TZAT-0013SLOWVT: 2
TZAT-0018ATACH: NEGATIVE
TZAT-0018ATACH: NEGATIVE
TZAT-0018ATACH: NEGATIVE
TZAT-0018SLOWVT: NEGATIVE
TZAT-0019ATACH: 6 V
TZAT-0019ATACH: 6 V
TZAT-0019FASTVT: 8 V
TZAT-0019SLOWVT: 8 V
TZAT-0020ATACH: 1.5 ms
TZAT-0020ATACH: 1.5 ms
TZON-0003ATACH: 350 ms
TZON-0003SLOWVT: 350 ms
TZST-0001ATACH: 5
TZST-0001FASTVT: 2
TZST-0001FASTVT: 3
TZST-0001FASTVT: 5
TZST-0001SLOWVT: 3
TZST-0001SLOWVT: 4
TZST-0001SLOWVT: 5
TZST-0002ATACH: NEGATIVE
TZST-0002FASTVT: NEGATIVE
TZST-0003SLOWVT: 15 J
TZST-0003SLOWVT: 25 J
VENTRICULAR PACING ICD: 51.01 pct

## 2012-03-22 ENCOUNTER — Encounter: Payer: Self-pay | Admitting: *Deleted

## 2012-05-14 ENCOUNTER — Encounter: Payer: Self-pay | Admitting: *Deleted

## 2012-05-14 ENCOUNTER — Ambulatory Visit (INDEPENDENT_AMBULATORY_CARE_PROVIDER_SITE_OTHER): Payer: Medicare Other | Admitting: Internal Medicine

## 2012-05-14 ENCOUNTER — Encounter: Payer: Self-pay | Admitting: Internal Medicine

## 2012-05-14 VITALS — BP 134/82 | HR 76 | Ht 62.0 in | Wt 174.5 lb

## 2012-05-14 DIAGNOSIS — Z95810 Presence of automatic (implantable) cardiac defibrillator: Secondary | ICD-10-CM

## 2012-05-14 DIAGNOSIS — I5022 Chronic systolic (congestive) heart failure: Secondary | ICD-10-CM

## 2012-05-14 DIAGNOSIS — I2589 Other forms of chronic ischemic heart disease: Secondary | ICD-10-CM

## 2012-05-14 DIAGNOSIS — I4891 Unspecified atrial fibrillation: Secondary | ICD-10-CM

## 2012-05-14 LAB — ICD DEVICE OBSERVATION
AL AMPLITUDE: 0.3 mv
ATRIAL PACING ICD: 5.7 pct
BATTERY VOLTAGE: 3.14 V
BRDY-0002RV: 50 {beats}/min
DEV-0020ICD: NEGATIVE
RV LEAD AMPLITUDE: 2.5 mv
TZAT-0001ATACH: 1
TZAT-0002ATACH: NEGATIVE
TZAT-0002ATACH: NEGATIVE
TZAT-0005SLOWVT: 88 pct
TZAT-0005SLOWVT: 91 pct
TZAT-0011SLOWVT: 10 ms
TZAT-0011SLOWVT: 10 ms
TZAT-0012ATACH: 150 ms
TZAT-0012SLOWVT: 200 ms
TZAT-0012SLOWVT: 200 ms
TZAT-0013SLOWVT: 2
TZAT-0018ATACH: NEGATIVE
TZAT-0018SLOWVT: NEGATIVE
TZAT-0018SLOWVT: NEGATIVE
TZAT-0019ATACH: 6 V
TZAT-0019ATACH: 6 V
TZAT-0019FASTVT: 8 V
TZAT-0020ATACH: 1.5 ms
TZAT-0020FASTVT: 1.5 ms
TZON-0003SLOWVT: 350 ms
TZON-0004SLOWVT: 24
TZON-0005SLOWVT: 12
TZST-0001ATACH: 4
TZST-0001ATACH: 5
TZST-0001FASTVT: 2
TZST-0001FASTVT: 3
TZST-0001SLOWVT: 4
TZST-0001SLOWVT: 6
TZST-0002ATACH: NEGATIVE
TZST-0002FASTVT: NEGATIVE
TZST-0002FASTVT: NEGATIVE
TZST-0002FASTVT: NEGATIVE
TZST-0003SLOWVT: 35 J
TZST-0003SLOWVT: 35 J

## 2012-05-14 MED ORDER — METOPROLOL TARTRATE 50 MG PO TABS: 50.0000 mg | ORAL_TABLET | Freq: Two times a day (BID) | ORAL | Status: DC

## 2012-05-14 NOTE — Assessment & Plan Note (Signed)
Her heart failure symptoms appear to be class II. It's unclear to me whether her fatigue is more related to medications or due to heart failure. I've asked the patient to reduce her salt intake. She will continue her diuretic therapy. At this time I will not increase her dose of Lasix but might consider doing so in the future.

## 2012-05-14 NOTE — Patient Instructions (Addendum)
Your physician wants you to follow-up in: 6 months with Dr [name].  You will receive a reminder letter in the mail two months in advance. If you don't receive a letter, please call our office to schedule the follow-up appointment.  Remote monitoring is used to monitor your Pacemaker of ICD from home. This monitoring reduces the number of office visits required to check your device to one time per year. It allows [place] to keep an eye on the functioning of your device to ensure it is working properly. You are scheduled for a device check from home on 08/19/12. You may send your transmission at any time that day. If you have a wireless device, the transmission will be sent automatically. After your physician reviews your transmission, you will receive a postcard with your next transmission date.  Your physician has recommended you make the following change in your medication: DECREASE your Metoprolol to 50mg  twice daily

## 2012-05-14 NOTE — Progress Notes (Signed)
HPI Barbara Blackburn returns today for followup. She is a very pleasant 77 year old woman with a history of chronic atrial fibrillation, symptomatic bradycardia, status post ICD insertion, systolic heart failure, and obesity. The patient has felt poorly. She complains of fatigue and weakness. She denies sodium indiscretion. She does have hypertension. Her blood pressure however has been fairly well-controlled. She notes some mild peripheral edema. She has shortness of breath with exertion. She denies noncompliance. Allergies  Allergen Reactions  . Clonazepam   . Fluoxetine Hcl   . Lisinopril   . Zolpidem Tartrate      Current Outpatient Prescriptions  Medication Sig Dispense Refill  . amLODipine (NORVASC) 10 MG tablet Take 10 mg by mouth daily.      [name] aspirin 81 MG EC tablet Take 81 mg by mouth daily.        . Calcium Carbonate-Vitamin D (CALCIUM-VITAMIN D) 500-200 MG-UNIT per tablet Take 1 tablet by mouth 2 (two) times daily with a meal.        . Cholecalciferol (VITAMIN D3) 2000 UNITS TABS Take 1 tablet by mouth daily.        . furosemide (LASIX) 40 MG tablet Take 40 mg by mouth 1 dose over 46 hours.        [name] glucose blood test strip 1 each by Other route as needed. Use as instructed      . Lancets MISC by Does not apply route every other day.      . losartan (COZAAR) 100 MG tablet Take 100 mg by mouth daily.      . metoprolol (LOPRESSOR) 50 MG tablet Take 1 tablet (50 mg total) by mouth 2 (two) times daily.  30 tablet  6  . Multiple Vitamins-Minerals (CENTRUM SILVER ULTRA WOMENS PO) Take 1 tablet by mouth daily.       . potassium chloride SA (K-DUR,KLOR-CON) 20 MEQ tablet Take 20 mEq by mouth daily.        . rosuvastatin (CRESTOR) 10 MG tablet Take 10 mg by mouth daily.      [name] spironolactone (ALDACTONE) 25 MG tablet Take 25 mg by mouth daily.        [name] warfarin (COUMADIN) 2 MG tablet Take 2 mg by mouth as directed.        . warfarin (COUMADIN) 4 MG tablet Take 4 mg by mouth as directed.         [name] DISCONTD: metoprolol (LOPRESSOR) 100 MG tablet Take 1 tablet (100 mg total) by mouth 2 (two) times daily.  30 tablet  6     Past Medical History  Diagnosis Date  . Nonischemic cardiomyopathy   . Coronary artery disease   . Chronic atrial fibrillation   . CHF (congestive heart failure)     class 2  . Degenerative joint disease     severe  . Dyslipidemia     ROS:   All systems reviewed and negative except as noted in the HPI.   Past Surgical History  Procedure Date  . Medtronic dual-chamber icd 01/05/2006    generator change April 2012  . Pci stent   . Replacement total knee      Family History  Problem Relation Age of Onset  . Heart attack Father      History   Social History  . Marital Status: Married    Spouse Name: N/A    Number of Children: N/A  . Years of Education: N/A   Occupational History  . Not on file.  Social History Main Topics  . Smoking status: Never Smoker   . Smokeless tobacco: Never Used  . Alcohol Use: No  . Drug Use: No  . Sexually Active: Not on file   Other Topics Concern  . Not on file   Social History Narrative  . No narrative on file     BP 134/82  Pulse 76  Ht 5\' 2"  (1.575 m)  Wt 174 lb 8 oz (79.153 kg)  BMI 31.92 kg/m2  Physical Exam:  Well appearing elderly woman, obese, NAD HEENT: Unremarkable Neck:  No JVD, no thyromegally Lungs:  Clear except for rare basilar rales. No wheezes or rhonchi. No increased work of breathing. HEART:  IRegular rate rhythm, no murmurs, no rubs, no clicks Abd:  soft, positive bowel sounds, no organomegally, no rebound, no guarding Ext:  2 plus pulses, no edema, no cyanosis, no clubbing Skin:  No rashes no nodules Neuro:  CN II through XII intact, motor grossly intact  DEVICE  Normal device function.  See PaceArt for details.   Assess/Plan:

## 2012-05-14 NOTE — Assessment & Plan Note (Signed)
Her ventricular rate appears to be well-controlled. Perhaps too well controlled. She is pacing in the ventricle approximately 45% of the time. We will reduce her dose of beta blocker today.

## 2012-05-14 NOTE — Assessment & Plan Note (Signed)
Her ICD is working normally. She has had no sustained ventricular arrhythmias. Her fluid index is fairly well compensated.

## 2012-08-19 ENCOUNTER — Encounter: Payer: Self-pay | Admitting: Internal Medicine

## 2012-08-19 ENCOUNTER — Ambulatory Visit (INDEPENDENT_AMBULATORY_CARE_PROVIDER_SITE_OTHER): Payer: Medicare Other | Admitting: *Deleted

## 2012-08-19 DIAGNOSIS — I2589 Other forms of chronic ischemic heart disease: Secondary | ICD-10-CM

## 2012-08-19 DIAGNOSIS — I5022 Chronic systolic (congestive) heart failure: Secondary | ICD-10-CM

## 2012-08-19 DIAGNOSIS — Z95810 Presence of automatic (implantable) cardiac defibrillator: Secondary | ICD-10-CM

## 2012-08-26 LAB — REMOTE ICD DEVICE
BRDY-0002RV: 50 {beats}/min
BRDY-0004RV: 120 {beats}/min
DEV-0020ICD: NEGATIVE
FVT: 0
PACEART VT: 0
RV LEAD AMPLITUDE: 3.1 mv
TOT-0001: 0
TZAT-0001ATACH: 3
TZAT-0001FASTVT: 1
TZAT-0002ATACH: NEGATIVE
TZAT-0002ATACH: NEGATIVE
TZAT-0002FASTVT: NEGATIVE
TZAT-0012ATACH: 150 ms
TZAT-0012ATACH: 150 ms
TZAT-0012FASTVT: 200 ms
TZAT-0012SLOWVT: 200 ms
TZAT-0018ATACH: NEGATIVE
TZAT-0018SLOWVT: NEGATIVE
TZAT-0018SLOWVT: NEGATIVE
TZAT-0019ATACH: 6 V
TZAT-0019ATACH: 6 V
TZAT-0019SLOWVT: 8 V
TZAT-0019SLOWVT: 8 V
TZAT-0020ATACH: 1.5 ms
TZAT-0020ATACH: 1.5 ms
TZAT-0020FASTVT: 1.5 ms
TZAT-0020SLOWVT: 1.5 ms
TZAT-0020SLOWVT: 1.5 ms
TZON-0003VSLOWVT: 380 ms
TZST-0001ATACH: 5
TZST-0001ATACH: 6
TZST-0001FASTVT: 4
TZST-0001FASTVT: 5
TZST-0001SLOWVT: 5
TZST-0002ATACH: NEGATIVE
TZST-0002ATACH: NEGATIVE
TZST-0002FASTVT: NEGATIVE
TZST-0002FASTVT: NEGATIVE
TZST-0002FASTVT: NEGATIVE
TZST-0003SLOWVT: 15 J
TZST-0003SLOWVT: 35 J

## 2012-08-30 ENCOUNTER — Encounter: Payer: Self-pay | Admitting: *Deleted

## 2012-11-05 ENCOUNTER — Encounter: Payer: Self-pay | Admitting: Internal Medicine

## 2012-11-05 ENCOUNTER — Ambulatory Visit (INDEPENDENT_AMBULATORY_CARE_PROVIDER_SITE_OTHER): Payer: Medicare Other | Admitting: Internal Medicine

## 2012-11-05 VITALS — BP 118/76 | HR 77 | Ht 61.0 in | Wt 187.8 lb

## 2012-11-05 DIAGNOSIS — I2589 Other forms of chronic ischemic heart disease: Secondary | ICD-10-CM

## 2012-11-05 DIAGNOSIS — I5022 Chronic systolic (congestive) heart failure: Secondary | ICD-10-CM

## 2012-11-05 DIAGNOSIS — I4892 Unspecified atrial flutter: Secondary | ICD-10-CM

## 2012-11-05 DIAGNOSIS — I4891 Unspecified atrial fibrillation: Secondary | ICD-10-CM

## 2012-11-05 DIAGNOSIS — Z95810 Presence of automatic (implantable) cardiac defibrillator: Secondary | ICD-10-CM

## 2012-11-05 LAB — ICD DEVICE OBSERVATION
AL AMPLITUDE: 0.625 mv
AL IMPEDENCE ICD: 418 Ohm
ATRIAL PACING ICD: 6.01 pct
BATTERY VOLTAGE: 3.1099 V
CHARGE TIME: 9.309 s
RV LEAD IMPEDENCE ICD: 589 Ohm
RV LEAD THRESHOLD: 0.875 V
TOT-0002: 0
TOT-0006: 20120426000000
TZAT-0001ATACH: 1
TZAT-0002ATACH: NEGATIVE
TZAT-0002ATACH: NEGATIVE
TZAT-0005SLOWVT: 88 pct
TZAT-0005SLOWVT: 91 pct
TZAT-0011SLOWVT: 10 ms
TZAT-0011SLOWVT: 10 ms
TZAT-0012SLOWVT: 200 ms
TZAT-0012SLOWVT: 200 ms
TZAT-0013SLOWVT: 2
TZAT-0013SLOWVT: 2
TZAT-0018ATACH: NEGATIVE
TZAT-0018SLOWVT: NEGATIVE
TZAT-0018SLOWVT: NEGATIVE
TZAT-0019ATACH: 6 V
TZAT-0019ATACH: 6 V
TZAT-0019FASTVT: 8 V
TZAT-0020ATACH: 1.5 ms
TZAT-0020FASTVT: 1.5 ms
TZON-0003SLOWVT: 350 ms
TZON-0004SLOWVT: 24
TZON-0005SLOWVT: 12
TZST-0001ATACH: 5
TZST-0001FASTVT: 3
TZST-0001SLOWVT: 4
TZST-0001SLOWVT: 6
TZST-0002ATACH: NEGATIVE
TZST-0002FASTVT: NEGATIVE
TZST-0002FASTVT: NEGATIVE
TZST-0002FASTVT: NEGATIVE
TZST-0003SLOWVT: 35 J
VENTRICULAR PACING ICD: 52.33 pct
VF: 0

## 2012-11-05 MED ORDER — AMLODIPINE BESYLATE 5 MG PO TABS: 5.0000 mg | ORAL_TABLET | Freq: Every day | ORAL | Status: DC

## 2012-11-05 NOTE — Assessment & Plan Note (Signed)
Her ventricular rate is well controlled. No change in medical therapy at this point.

## 2012-11-05 NOTE — Assessment & Plan Note (Signed)
Her heart failure symptoms are class II. She will continue her current medications except I've asked her to decrease her dose of amlodipine to 5 mg daily.

## 2012-11-05 NOTE — Progress Notes (Signed)
HPI  Mrs. Croweder returns today for followup. She is a very pleasant 79 year old woman with an ischemic cardiomyopathy, chronic systolic heart failure, chronic atrial fibrillation, and bradycardia, status post dual-chamber ICD implantation. When I saw her last several months ago, she was feeling fatigued and weak and we have her decrease her dose of beta blocker. This may have helped a little bit. She admits to being sedentary but she does not like to get out in cold weather. She denies chest pain or peripheral edema. She does have chronic shortness of breath with exertion. No syncope or ICD shock. Allergies  Allergen Reactions  . Clonazepam   . Fluoxetine Hcl   . Lisinopril   . Zolpidem Tartrate      Current Outpatient Prescriptions  Medication Sig Dispense Refill  . amLODipine (NORVASC) 10 MG tablet Take 10 mg by mouth daily.      [name] aspirin 81 MG EC tablet Take 81 mg by mouth daily.        . Calcium Carbonate-Vitamin D (CALCIUM-VITAMIN D) 500-200 MG-UNIT per tablet Take 1 tablet by mouth 2 (two) times daily with a meal.        . Cholecalciferol (VITAMIN D3) 2000 UNITS TABS Take 1 tablet by mouth daily.        . furosemide (LASIX) 40 MG tablet Take 40 mg by mouth 1 dose over 46 hours.        [name] glucose blood test strip 1 each by Other route as needed. Use as instructed      . Lancets MISC by Does not apply route every other day.      . losartan (COZAAR) 100 MG tablet Take 100 mg by mouth daily.      . metoprolol (LOPRESSOR) 50 MG tablet Take 1 tablet (50 mg total) by mouth 2 (two) times daily.  30 tablet  6  . Multiple Vitamins-Minerals (CENTRUM SILVER ULTRA WOMENS PO) Take 1 tablet by mouth daily.       . rosuvastatin (CRESTOR) 10 MG tablet Take 10 mg by mouth daily.      [name] spironolactone (ALDACTONE) 25 MG tablet Take 25 mg by mouth daily.        [name] warfarin (COUMADIN) 2 MG tablet Take 2 mg by mouth as directed.        . warfarin (COUMADIN) 4 MG tablet Take 4 mg by mouth as directed.          No current facility-administered medications for this visit.     Past Medical History  Diagnosis Date  . Nonischemic cardiomyopathy   . Coronary artery disease   . Chronic atrial fibrillation   . CHF (congestive heart failure)     class 2  . Degenerative joint disease     severe  . Dyslipidemia     ROS:   All systems reviewed and negative except as noted in the HPI.   Past Surgical History  Procedure Laterality Date  . Medtronic dual-chamber icd  01/05/2006    generator change April 2012  . Pci stent    . Replacement total knee       Family History  Problem Relation Age of Onset  . Heart attack Father      History   Social History  . Marital Status: Married    Spouse Name: N/A    Number of Children: N/A  . Years of Education: N/A   Occupational History  . Not on file.   Social History Main Topics  .  Smoking status: Never Smoker   . Smokeless tobacco: Never Used  . Alcohol Use: No  . Drug Use: No  . Sexually Active: Not on file   Other Topics Concern  . Not on file   Social History Narrative  . No narrative on file     BP 118/76  Pulse 77  Ht 5\' 1"  (1.549 m)  Wt 187 lb 12 oz (85.163 kg)  BMI 35.49 kg/m2  Physical Exam:  Well appearing 79 year old woman,NAD HEENT: Unremarkable Neck:  No JVD, no thyromegally Lungs:  Clear with no wheezes, rales, or rhonchi. HEART:  Regular rate rhythm, no murmurs, no rubs, no clicks Abd:  soft, positive bowel sounds, no organomegally, no rebound, no guarding Ext:  2 plus pulses, no edema, no cyanosis, no clubbing Skin:  No rashes no nodules Neuro:  CN II through XII intact, motor grossly intact  EKG - atrial fibrillation with ventricular pacing.  DEVICE  Normal device function.  See PaceArt for details.   Assess/Plan:

## 2012-11-05 NOTE — Patient Instructions (Addendum)
Follow up with Dr. [name] in 1 year. Need to send in carelink on February 10, 2013.   Need to decrease the amlodipine to 5 mg one tablet daily.

## 2012-11-05 NOTE — Assessment & Plan Note (Signed)
Her Medtronic dual-chamber ICD is working normally. We'll plan to recheck in several months. I note that she is pacing in the ventricle approximately 50% of the time.

## 2013-02-10 ENCOUNTER — Ambulatory Visit (INDEPENDENT_AMBULATORY_CARE_PROVIDER_SITE_OTHER): Payer: Medicare Other | Admitting: *Deleted

## 2013-02-10 ENCOUNTER — Encounter: Payer: Self-pay | Admitting: Internal Medicine

## 2013-02-10 DIAGNOSIS — I5022 Chronic systolic (congestive) heart failure: Secondary | ICD-10-CM

## 2013-02-10 DIAGNOSIS — Z95810 Presence of automatic (implantable) cardiac defibrillator: Secondary | ICD-10-CM

## 2013-02-10 DIAGNOSIS — I2589 Other forms of chronic ischemic heart disease: Secondary | ICD-10-CM

## 2013-02-25 LAB — REMOTE ICD DEVICE
AL IMPEDENCE ICD: 418 Ohm
BATTERY VOLTAGE: 3.0963 V
DEV-0020ICD: NEGATIVE
FVT: 0
PACEART VT: 0
RV LEAD THRESHOLD: 0.875 V
TOT-0006: 20120426000000
TZAT-0001ATACH: 1
TZAT-0001ATACH: 2
TZAT-0001ATACH: 3
TZAT-0001FASTVT: 1
TZAT-0001SLOWVT: 1
TZAT-0001SLOWVT: 2
TZAT-0002ATACH: NEGATIVE
TZAT-0002FASTVT: NEGATIVE
TZAT-0012ATACH: 150 ms
TZAT-0012ATACH: 150 ms
TZAT-0013SLOWVT: 2
TZAT-0013SLOWVT: 2
TZAT-0018ATACH: NEGATIVE
TZAT-0018ATACH: NEGATIVE
TZAT-0018ATACH: NEGATIVE
TZAT-0018FASTVT: NEGATIVE
TZAT-0018SLOWVT: NEGATIVE
TZAT-0018SLOWVT: NEGATIVE
TZAT-0019ATACH: 6 V
TZAT-0020ATACH: 1.5 ms
TZAT-0020SLOWVT: 1.5 ms
TZAT-0020SLOWVT: 1.5 ms
TZON-0004SLOWVT: 24
TZON-0004VSLOWVT: 28
TZON-0005SLOWVT: 12
TZST-0001ATACH: 4
TZST-0001ATACH: 5
TZST-0001ATACH: 6
TZST-0001FASTVT: 3
TZST-0001FASTVT: 4
TZST-0001SLOWVT: 3
TZST-0001SLOWVT: 5
TZST-0001SLOWVT: 6
TZST-0002ATACH: NEGATIVE
TZST-0002ATACH: NEGATIVE
TZST-0002ATACH: NEGATIVE
TZST-0002FASTVT: NEGATIVE
TZST-0002FASTVT: NEGATIVE
TZST-0003SLOWVT: 25 J
TZST-0003SLOWVT: 35 J
VENTRICULAR PACING ICD: 46.76 pct
VF: 0

## 2013-03-04 ENCOUNTER — Encounter: Payer: Self-pay | Admitting: *Deleted

## 2013-05-26 ENCOUNTER — Ambulatory Visit (INDEPENDENT_AMBULATORY_CARE_PROVIDER_SITE_OTHER): Payer: Medicare Other | Admitting: *Deleted

## 2013-05-26 DIAGNOSIS — I4891 Unspecified atrial fibrillation: Secondary | ICD-10-CM

## 2013-05-26 DIAGNOSIS — I5022 Chronic systolic (congestive) heart failure: Secondary | ICD-10-CM

## 2013-05-26 LAB — REMOTE ICD DEVICE
AL AMPLITUDE: 0.9 mv
AL IMPEDENCE ICD: 456 Ohm
ATRIAL PACING ICD: 1.56 pct
BRDY-0002RV: 50 {beats}/min
RV LEAD IMPEDENCE ICD: 551 Ohm
TOT-0001: 0
TOT-0006: 20120426000000
TZAT-0002ATACH: NEGATIVE
TZAT-0002ATACH: NEGATIVE
TZAT-0004SLOWVT: 8
TZAT-0004SLOWVT: 8
TZAT-0005SLOWVT: 88 pct
TZAT-0005SLOWVT: 91 pct
TZAT-0011SLOWVT: 10 ms
TZAT-0011SLOWVT: 10 ms
TZAT-0012ATACH: 150 ms
TZAT-0012FASTVT: 200 ms
TZAT-0012SLOWVT: 200 ms
TZAT-0012SLOWVT: 200 ms
TZAT-0019ATACH: 6 V
TZAT-0019ATACH: 6 V
TZAT-0019FASTVT: 8 V
TZAT-0019SLOWVT: 8 V
TZAT-0020ATACH: 1.5 ms
TZAT-0020ATACH: 1.5 ms
TZAT-0020FASTVT: 1.5 ms
TZON-0003ATACH: 350 ms
TZON-0003SLOWVT: 350 ms
TZON-0003VSLOWVT: 380 ms
TZST-0001FASTVT: 2
TZST-0001FASTVT: 5
TZST-0001SLOWVT: 4
TZST-0002FASTVT: NEGATIVE
TZST-0002FASTVT: NEGATIVE
TZST-0002FASTVT: NEGATIVE
TZST-0003SLOWVT: 15 J
TZST-0003SLOWVT: 35 J
VENTRICULAR PACING ICD: 43.72 pct
VF: 0

## 2013-05-27 ENCOUNTER — Encounter: Payer: Self-pay | Admitting: *Deleted

## 2013-06-04 ENCOUNTER — Encounter: Payer: Self-pay | Admitting: Internal Medicine

## 2013-08-25 ENCOUNTER — Encounter: Payer: Medicare Other | Admitting: *Deleted

## 2013-09-04 ENCOUNTER — Encounter: Payer: Self-pay | Admitting: *Deleted

## 2013-10-27 ENCOUNTER — Other Ambulatory Visit: Payer: Self-pay | Admitting: Internal Medicine

## 2013-11-11 ENCOUNTER — Ambulatory Visit (INDEPENDENT_AMBULATORY_CARE_PROVIDER_SITE_OTHER): Payer: Medicare Other | Admitting: Internal Medicine

## 2013-11-11 ENCOUNTER — Encounter: Payer: Self-pay | Admitting: Internal Medicine

## 2013-11-11 VITALS — BP 151/89 | HR 75 | Ht 62.0 in | Wt 142.8 lb

## 2013-11-11 DIAGNOSIS — I5022 Chronic systolic (congestive) heart failure: Secondary | ICD-10-CM

## 2013-11-11 DIAGNOSIS — T82198A Other mechanical complication of other cardiac electronic device, initial encounter: Secondary | ICD-10-CM

## 2013-11-11 DIAGNOSIS — I4891 Unspecified atrial fibrillation: Secondary | ICD-10-CM

## 2013-11-11 HISTORY — DX: Other mechanical complication of other cardiac electronic device, initial encounter: T82.198A

## 2013-11-11 LAB — MDC_IDC_ENUM_SESS_TYPE_INCLINIC
Brady Statistic AP VP Percent: 1.76 %
Brady Statistic AS VS Percent: 59.66 %
Brady Statistic RV Percent Paced: 40.07 %
Date Time Interrogation Session: 20150317112135
HIGH POWER IMPEDANCE MEASURED VALUE: 171 Ohm
HIGH POWER IMPEDANCE MEASURED VALUE: 47 Ohm
HIGH POWER IMPEDANCE MEASURED VALUE: 62 Ohm
HighPow Impedance: 475 Ohm
Lead Channel Impedance Value: 551 Ohm
Lead Channel Pacing Threshold Pulse Width: 0.4 ms
Lead Channel Sensing Intrinsic Amplitude: 2.25 mV
MDC IDC MSMT BATTERY VOLTAGE: 3.04 V
MDC IDC MSMT LEADCHNL RA IMPEDANCE VALUE: 456 Ohm
MDC IDC MSMT LEADCHNL RA SENSING INTR AMPL: 0.625 mV
MDC IDC MSMT LEADCHNL RV PACING THRESHOLD AMPLITUDE: 1 V
MDC IDC SET LEADCHNL RA PACING AMPLITUDE: 2.75 V
MDC IDC SET LEADCHNL RV PACING AMPLITUDE: 2.5 V
MDC IDC SET LEADCHNL RV PACING PULSEWIDTH: 0.4 ms
MDC IDC SET LEADCHNL RV SENSING SENSITIVITY: 0.3 mV
MDC IDC SET ZONE DETECTION INTERVAL: 300 ms
MDC IDC SET ZONE DETECTION INTERVAL: 380 ms
MDC IDC STAT BRADY AP VS PERCENT: 0.28 %
MDC IDC STAT BRADY AS VP PERCENT: 38.31 %
MDC IDC STAT BRADY RA PERCENT PACED: 2.04 %
Zone Setting Detection Interval: 350 ms
Zone Setting Detection Interval: 350 ms

## 2013-11-11 NOTE — Patient Instructions (Addendum)
Your physician has recommended you make the following change in your medication:  Stop Aspirin Take Lasix twice a day for 7 days then back to normal   Your physician recommends that you have lab work today: BMP  Magnesium   Remote monitoring is used to monitor your Pacemaker of ICD from home. This monitoring reduces the number of office visits required to check your device to one time per year. It allows [place] to keep an eye on the functioning of your device to ensure it is working properly. You are scheduled for a device check from home on 11/12/13. You may send your transmission at any time that day. If you have a wireless device, the transmission will be sent automatically. After your physician reviews your transmission, you will receive a postcard with your next transmission date.  Call in 1 week with update on dizziness   Your physician wants you to follow-up in: 1 year with Dr. Caryl Comes. You will receive a reminder letter in the mail two months in advance. If you don't receive a letter, please call our office to schedule the follow-up appointment.

## 2013-11-11 NOTE — Progress Notes (Signed)
Patient Care Team: Golden Pop, MD as PCP - General (Unknown Physician Specialty)   HPI  Barbara Blackburn is a 80 y.o. female  Seen in followup for a ICD implanted in the setting of non ischemic cardiomyopathy  and prior congestive heart failure. It was implanted in the wake of a syncopal episode. Her ejection fraction had been noted to be 20%.  She also received a defibrillator with a 6949-lead  Device generator replacement in 2012 she underwent "pirating" of her rate sense pacing lead and capping of the rate sense portion of her ICD lead  She has complaints of fatigue  She also has a history of orthostatic intolerance particularly with standing abruptly. There've been a variety of adjustments to her medications of late.  Past Medical History  Diagnosis Date  . Nonischemic cardiomyopathy   . Coronary artery disease   . Chronic atrial fibrillation   . CHF (congestive heart failure)     class 2  . Degenerative joint disease     severe  . Dyslipidemia     Past Surgical History  Procedure Laterality Date  . Medtronic dual-chamber icd  01/05/2006    generator change April 2012  . Pci stent    . Replacement total knee      Current Outpatient Prescriptions  Medication Sig Dispense Refill  . Acetaminophen (TYLENOL PO) Take by mouth as needed.      [name] amLODipine (NORVASC) 5 MG tablet TAKE 1 TABLET (5 MG TOTAL) BY MOUTH DAILY.  30 tablet  11  . aspirin 81 MG EC tablet Take 81 mg by mouth daily.        . furosemide (LASIX) 40 MG tablet Take 40 mg by mouth 1 dose over 46 hours.        [name] glucose blood test strip 1 each by Other route as needed. Use as instructed      . Lancets MISC by Does not apply route every other day.      . losartan (COZAAR) 50 MG tablet Take 50 mg by mouth daily.      . metoprolol succinate (TOPROL-XL) 100 MG 24 hr tablet Take 100 mg by mouth daily. Take with or immediately following a meal.      . Naproxen Sodium (ALEVE) 220 MG CAPS Take by mouth at  bedtime.      . rosuvastatin (CRESTOR) 10 MG tablet Take 10 mg by mouth daily.      [name] spironolactone (ALDACTONE) 25 MG tablet Take 25 mg by mouth daily.        [name] warfarin (COUMADIN) 2 MG tablet Take 2 mg by mouth as directed.        . warfarin (COUMADIN) 4 MG tablet Take 4 mg by mouth as directed.         No current facility-administered medications for this visit.    Allergies  Allergen Reactions  . Clonazepam   . Fluoxetine Hcl   . Lisinopril   . Zolpidem Tartrate     Review of Systems negative except from HPI and PMH  Physical Exam BP 151/89  Pulse 75  Ht 5\' 2"  (1.575 m)  Wt 142 lb 12 oz (64.751 kg)  BMI 26.10 kg/m2 Well developed and nourished in no acute distress HENT normal Neck supple with JVP 6-8 Clear Regular rate and rhythm, no murmurs or gallops Abd-soft with active BS No Clubbing cyanosis edema Skin-warm and dry A & Oriented  Grossly normal sensory and motor function  Assessment and  Plan  Positive optivol  Nonischemic cardiomyopathy  Congestive heart failure-chronic systolic  Syncope  ICD for secondary prevention  6949-lead with capping of the rate sense portion  The patient's device was interrogated.  The information was reviewed. No changes were made in the programming.     Orthostatic intolerance  She complains of significant fatigue. I wonder whether this is related to her metoprolol. Will anticipate switching of her carvedilol.  However, her optivol is markedly increased over the last number of months. This is in the absence of overt volume overload; hence we will undertake a one-week diuretic crease trial 40 daily to twice a day and see what her optivol does  and see if it makes her symptom  Better.  I worry that this may aggravate orthostatic intolerance which is noted objectively with a 30 beat heart rate increase today.  After that, I would anticipate undertaking a trial to change her beta blocker and see if this is contributing to  her symptoms.

## 2013-11-11 NOTE — Addendum Note (Signed)
Addended by: Tracie Harrier on: 11/11/2013 12:32 PM   Modules accepted: Orders, Medications

## 2013-11-12 LAB — MAGNESIUM: Magnesium: 2.3 mg/dL (ref 1.6–2.6)

## 2013-11-12 LAB — BASIC METABOLIC PANEL
BUN/Creatinine Ratio: 39 — ABNORMAL HIGH (ref 11–26)
BUN: 37 mg/dL — ABNORMAL HIGH (ref 8–27)
CALCIUM: 9.7 mg/dL (ref 8.7–10.3)
CO2: 20 mmol/L (ref 18–29)
Chloride: 103 mmol/L (ref 97–108)
Creatinine, Ser: 0.96 mg/dL (ref 0.57–1.00)
GFR calc Af Amer: 64 mL/min/{1.73_m2} (ref >59)
GFR calc non Af Amer: 55 mL/min/{1.73_m2} — ABNORMAL LOW (ref >59)
Glucose: 85 mg/dL (ref 65–99)
POTASSIUM: 4.6 mmol/L (ref 3.5–5.2)
SODIUM: 143 mmol/L (ref 134–144)

## 2013-11-18 ENCOUNTER — Telehealth: Payer: Self-pay

## 2013-11-18 NOTE — Telephone Encounter (Signed)
Pt called stating that she was instructed to call Dr. Caryl Comes in a week and let him know how she is doing. Pt reports that her dizziness has greatly improved and she just wanted to let him know.  Pt to call w/ further questions or concerns.

## 2013-12-22 ENCOUNTER — Telehealth: Payer: Self-pay | Admitting: *Deleted

## 2013-12-22 DIAGNOSIS — I5022 Chronic systolic (congestive) heart failure: Secondary | ICD-10-CM

## 2013-12-22 NOTE — Telephone Encounter (Signed)
Message copied by Tracie Harrier on Mon Dec 22, 2013  3:54 PM ------      Message from: Deboraha Sprang      Created: Mon Dec 22, 2013 11:26 AM       Please Inform Patient that Bun Cr are elevated  We whould recheck BMET at this point to see if mild abnormality is stable or has worsened      Thanks       ------

## 2013-12-24 ENCOUNTER — Ambulatory Visit (INDEPENDENT_AMBULATORY_CARE_PROVIDER_SITE_OTHER): Payer: Medicare Other

## 2013-12-24 ENCOUNTER — Other Ambulatory Visit: Payer: Self-pay | Admitting: Internal Medicine

## 2013-12-24 DIAGNOSIS — I5022 Chronic systolic (congestive) heart failure: Secondary | ICD-10-CM

## 2014-02-12 ENCOUNTER — Ambulatory Visit (INDEPENDENT_AMBULATORY_CARE_PROVIDER_SITE_OTHER): Payer: Medicare Other | Admitting: *Deleted

## 2014-02-12 ENCOUNTER — Encounter: Payer: Self-pay | Admitting: Internal Medicine

## 2014-02-12 DIAGNOSIS — Z95810 Presence of automatic (implantable) cardiac defibrillator: Secondary | ICD-10-CM

## 2014-02-12 DIAGNOSIS — I2589 Other forms of chronic ischemic heart disease: Secondary | ICD-10-CM

## 2014-02-12 DIAGNOSIS — I5022 Chronic systolic (congestive) heart failure: Secondary | ICD-10-CM

## 2014-02-12 NOTE — Progress Notes (Signed)
Remote ICD transmission.   

## 2014-02-18 LAB — MDC_IDC_ENUM_SESS_TYPE_REMOTE
Battery Voltage: 3.01 V
Brady Statistic RA Percent Paced: 3.95 %
Date Time Interrogation Session: 20150618131348
HIGH POWER IMPEDANCE MEASURED VALUE: 456 Ohm
HighPow Impedance: 190 Ohm
HighPow Impedance: 45 Ohm
HighPow Impedance: 61 Ohm
Lead Channel Impedance Value: 532 Ohm
Lead Channel Sensing Intrinsic Amplitude: 0.75 mV
Lead Channel Sensing Intrinsic Amplitude: 0.75 mV
Lead Channel Sensing Intrinsic Amplitude: 1.125 mV
Lead Channel Setting Pacing Pulse Width: 0.4 ms
MDC IDC MSMT LEADCHNL RA IMPEDANCE VALUE: 456 Ohm
MDC IDC MSMT LEADCHNL RV PACING THRESHOLD AMPLITUDE: 1.25 V
MDC IDC MSMT LEADCHNL RV PACING THRESHOLD PULSEWIDTH: 0.4 ms
MDC IDC MSMT LEADCHNL RV SENSING INTR AMPL: 1.125 mV
MDC IDC SET LEADCHNL RA PACING AMPLITUDE: 2.75 V
MDC IDC SET LEADCHNL RV PACING AMPLITUDE: 2.75 V
MDC IDC SET LEADCHNL RV SENSING SENSITIVITY: 0.3 mV
MDC IDC SET ZONE DETECTION INTERVAL: 380 ms
MDC IDC STAT BRADY AP VP PERCENT: 3.51 %
MDC IDC STAT BRADY AP VS PERCENT: 0.44 %
MDC IDC STAT BRADY AS VP PERCENT: 43.57 %
MDC IDC STAT BRADY AS VS PERCENT: 52.48 %
MDC IDC STAT BRADY RV PERCENT PACED: 47.07 %
Zone Setting Detection Interval: 300 ms
Zone Setting Detection Interval: 350 ms
Zone Setting Detection Interval: 350 ms

## 2014-02-24 ENCOUNTER — Encounter: Payer: Self-pay | Admitting: Cardiology

## 2014-02-26 LAB — BASIC METABOLIC PANEL
BUN / CREAT RATIO: 37 — AB (ref 11–26)
BUN: 39 mg/dL — AB (ref 8–27)
CHLORIDE: 97 mmol/L (ref 97–108)
CO2: 27 mmol/L (ref 18–29)
Calcium: 10.1 mg/dL (ref 8.7–10.3)
Creatinine, Ser: 1.05 mg/dL — ABNORMAL HIGH (ref 0.57–1.00)
GFR calc Af Amer: 57 mL/min/{1.73_m2} — ABNORMAL LOW (ref >59)
GFR calc non Af Amer: 50 mL/min/{1.73_m2} — ABNORMAL LOW (ref >59)
Glucose: 89 mg/dL (ref 65–99)
POTASSIUM: 5 mmol/L (ref 3.5–5.2)
SODIUM: 141 mmol/L (ref 134–144)

## 2014-05-06 DIAGNOSIS — M1990 Unspecified osteoarthritis, unspecified site: Secondary | ICD-10-CM | POA: Insufficient documentation

## 2014-05-19 ENCOUNTER — Telehealth: Payer: Self-pay | Admitting: Cardiology

## 2014-05-19 ENCOUNTER — Encounter: Payer: Self-pay | Admitting: Internal Medicine

## 2014-05-19 ENCOUNTER — Ambulatory Visit (INDEPENDENT_AMBULATORY_CARE_PROVIDER_SITE_OTHER): Payer: Medicare Other | Admitting: *Deleted

## 2014-05-19 DIAGNOSIS — I2589 Other forms of chronic ischemic heart disease: Secondary | ICD-10-CM

## 2014-05-19 DIAGNOSIS — I5022 Chronic systolic (congestive) heart failure: Secondary | ICD-10-CM

## 2014-05-19 NOTE — Progress Notes (Signed)
Remote ICD transmission.   

## 2014-05-19 NOTE — Telephone Encounter (Signed)
Spoke with pt and reminded pt of remote transmission that is due today. Pt verbalized understanding.   

## 2014-05-20 LAB — MDC_IDC_ENUM_SESS_TYPE_REMOTE
Battery Voltage: 3.01 V
Brady Statistic AP VP Percent: 5.4 %
Brady Statistic AS VS Percent: 46.72 %
Brady Statistic RA Percent Paced: 6.18 %
Date Time Interrogation Session: 20150922165622
HIGH POWER IMPEDANCE MEASURED VALUE: 42 Ohm
HIGH POWER IMPEDANCE MEASURED VALUE: 56 Ohm
HighPow Impedance: 171 Ohm
HighPow Impedance: 475 Ohm
Lead Channel Impedance Value: 551 Ohm
Lead Channel Pacing Threshold Amplitude: 0.875 V
Lead Channel Sensing Intrinsic Amplitude: 0.375 mV
Lead Channel Sensing Intrinsic Amplitude: 0.375 mV
Lead Channel Sensing Intrinsic Amplitude: 2 mV
Lead Channel Sensing Intrinsic Amplitude: 2 mV
Lead Channel Setting Pacing Amplitude: 2.75 V
MDC IDC MSMT LEADCHNL RA IMPEDANCE VALUE: 456 Ohm
MDC IDC MSMT LEADCHNL RV PACING THRESHOLD PULSEWIDTH: 0.4 ms
MDC IDC SET LEADCHNL RV PACING AMPLITUDE: 2.5 V
MDC IDC SET LEADCHNL RV PACING PULSEWIDTH: 0.4 ms
MDC IDC SET LEADCHNL RV SENSING SENSITIVITY: 0.3 mV
MDC IDC SET ZONE DETECTION INTERVAL: 350 ms
MDC IDC SET ZONE DETECTION INTERVAL: 380 ms
MDC IDC STAT BRADY AP VS PERCENT: 0.78 %
MDC IDC STAT BRADY AS VP PERCENT: 47.11 %
MDC IDC STAT BRADY RV PERCENT PACED: 52.51 %
Zone Setting Detection Interval: 300 ms
Zone Setting Detection Interval: 350 ms

## 2014-08-19 ENCOUNTER — Encounter: Payer: Self-pay | Admitting: Internal Medicine

## 2014-08-19 ENCOUNTER — Ambulatory Visit (INDEPENDENT_AMBULATORY_CARE_PROVIDER_SITE_OTHER): Payer: Medicare Other | Admitting: *Deleted

## 2014-08-19 DIAGNOSIS — I4891 Unspecified atrial fibrillation: Secondary | ICD-10-CM

## 2014-08-19 DIAGNOSIS — I5022 Chronic systolic (congestive) heart failure: Secondary | ICD-10-CM

## 2014-08-19 LAB — MDC_IDC_ENUM_SESS_TYPE_REMOTE
Brady Statistic AP VS Percent: 1.26 %
Brady Statistic AS VP Percent: 32.51 %
Brady Statistic AS VS Percent: 59.91 %
Brady Statistic RV Percent Paced: 38.83 %
Date Time Interrogation Session: 20151223153959
HIGH POWER IMPEDANCE MEASURED VALUE: 46 Ohm
HIGH POWER IMPEDANCE MEASURED VALUE: 475 Ohm
HighPow Impedance: 60 Ohm
Lead Channel Impedance Value: 456 Ohm
Lead Channel Sensing Intrinsic Amplitude: 0.5 mV
Lead Channel Sensing Intrinsic Amplitude: 0.5 mV
Lead Channel Sensing Intrinsic Amplitude: 2.5 mV
Lead Channel Sensing Intrinsic Amplitude: 2.5 mV
Lead Channel Setting Pacing Amplitude: 2.75 V
MDC IDC MSMT BATTERY VOLTAGE: 2.99 V
MDC IDC MSMT LEADCHNL RV IMPEDANCE VALUE: 589 Ohm
MDC IDC MSMT LEADCHNL RV PACING THRESHOLD AMPLITUDE: 1 V
MDC IDC MSMT LEADCHNL RV PACING THRESHOLD PULSEWIDTH: 0.4 ms
MDC IDC SET LEADCHNL RV PACING AMPLITUDE: 2.5 V
MDC IDC SET LEADCHNL RV PACING PULSEWIDTH: 0.4 ms
MDC IDC SET LEADCHNL RV SENSING SENSITIVITY: 0.3 mV
MDC IDC SET ZONE DETECTION INTERVAL: 380 ms
MDC IDC STAT BRADY AP VP PERCENT: 6.32 %
MDC IDC STAT BRADY RA PERCENT PACED: 7.57 %
Zone Setting Detection Interval: 300 ms
Zone Setting Detection Interval: 350 ms
Zone Setting Detection Interval: 350 ms

## 2014-08-19 NOTE — Progress Notes (Signed)
Remote ICD transmission.   

## 2014-08-25 ENCOUNTER — Encounter: Payer: Self-pay | Admitting: Cardiology

## 2014-09-01 DIAGNOSIS — I4891 Unspecified atrial fibrillation: Secondary | ICD-10-CM | POA: Diagnosis not present

## 2014-09-29 DIAGNOSIS — I4891 Unspecified atrial fibrillation: Secondary | ICD-10-CM | POA: Diagnosis not present

## 2014-10-09 DIAGNOSIS — E785 Hyperlipidemia, unspecified: Secondary | ICD-10-CM | POA: Diagnosis not present

## 2014-10-09 DIAGNOSIS — E119 Type 2 diabetes mellitus without complications: Secondary | ICD-10-CM | POA: Diagnosis not present

## 2014-10-09 DIAGNOSIS — N183 Chronic kidney disease, stage 3 (moderate): Secondary | ICD-10-CM | POA: Diagnosis not present

## 2014-10-09 DIAGNOSIS — I129 Hypertensive chronic kidney disease with stage 1 through stage 4 chronic kidney disease, or unspecified chronic kidney disease: Secondary | ICD-10-CM | POA: Diagnosis not present

## 2014-10-09 DIAGNOSIS — I259 Chronic ischemic heart disease, unspecified: Secondary | ICD-10-CM | POA: Diagnosis not present

## 2014-10-11 ENCOUNTER — Other Ambulatory Visit: Payer: Self-pay | Admitting: Internal Medicine

## 2014-10-12 NOTE — Telephone Encounter (Signed)
PATIENT NEEDS FOLLOW UP WITH DR. Lovena Le. LAST SEEN IN [facility] OF 2015

## 2014-10-27 DIAGNOSIS — I482 Chronic atrial fibrillation: Secondary | ICD-10-CM | POA: Diagnosis not present

## 2014-10-27 DIAGNOSIS — I10 Essential (primary) hypertension: Secondary | ICD-10-CM | POA: Diagnosis not present

## 2014-10-27 DIAGNOSIS — I4891 Unspecified atrial fibrillation: Secondary | ICD-10-CM | POA: Diagnosis not present

## 2014-10-27 DIAGNOSIS — I2510 Atherosclerotic heart disease of native coronary artery without angina pectoris: Secondary | ICD-10-CM | POA: Diagnosis not present

## 2014-10-27 DIAGNOSIS — I5022 Chronic systolic (congestive) heart failure: Secondary | ICD-10-CM | POA: Diagnosis not present

## 2014-11-10 ENCOUNTER — Encounter: Payer: Self-pay | Admitting: Internal Medicine

## 2014-11-10 ENCOUNTER — Ambulatory Visit (INDEPENDENT_AMBULATORY_CARE_PROVIDER_SITE_OTHER): Payer: Medicare Other | Admitting: Internal Medicine

## 2014-11-10 VITALS — BP 148/78 | HR 73 | Ht 62.0 in | Wt 159.5 lb

## 2014-11-10 DIAGNOSIS — I4891 Unspecified atrial fibrillation: Secondary | ICD-10-CM | POA: Diagnosis not present

## 2014-11-10 LAB — MDC_IDC_ENUM_SESS_TYPE_INCLINIC
Brady Statistic AP VP Percent: 4.65 %
Brady Statistic AP VS Percent: 0.81 %
Brady Statistic AS VS Percent: 57.38 %
Brady Statistic RA Percent Paced: 5.46 %
Date Time Interrogation Session: 20160315101013
HIGH POWER IMPEDANCE MEASURED VALUE: 190 Ohm
HighPow Impedance: 456 Ohm
HighPow Impedance: 47 Ohm
HighPow Impedance: 61 Ohm
Lead Channel Impedance Value: 456 Ohm
Lead Channel Impedance Value: 551 Ohm
Lead Channel Pacing Threshold Pulse Width: 0.4 ms
Lead Channel Sensing Intrinsic Amplitude: 0.375 mV
Lead Channel Sensing Intrinsic Amplitude: 0.5 mV
Lead Channel Sensing Intrinsic Amplitude: 2 mV
Lead Channel Sensing Intrinsic Amplitude: 2.75 mV
Lead Channel Setting Pacing Amplitude: 2.5 V
Lead Channel Setting Sensing Sensitivity: 0.3 mV
MDC IDC MSMT BATTERY VOLTAGE: 2.97 V
MDC IDC MSMT LEADCHNL RV PACING THRESHOLD AMPLITUDE: 1.125 V
MDC IDC SET LEADCHNL RA PACING AMPLITUDE: 2.75 V
MDC IDC SET LEADCHNL RV PACING PULSEWIDTH: 0.4 ms
MDC IDC SET ZONE DETECTION INTERVAL: 350 ms
MDC IDC STAT BRADY AS VP PERCENT: 37.16 %
MDC IDC STAT BRADY RV PERCENT PACED: 41.81 %
Zone Setting Detection Interval: 300 ms
Zone Setting Detection Interval: 350 ms
Zone Setting Detection Interval: 380 ms

## 2014-11-10 NOTE — Patient Instructions (Signed)
Remote monitoring is used to monitor your Pacemaker of ICD from home. This monitoring reduces the number of office visits required to check your device to one time per year. It allows [place] to keep an eye on the functioning of your device to ensure it is working properly. You are scheduled for a device check from home on 02/09/15. You may send your transmission at any time that day. If you have a wireless device, the transmission will be sent automatically. After your physician reviews your transmission, you will receive a postcard with your next transmission date.  Your physician wants you to follow-up in: 1 year with Dr. Caryl Comes. You will receive a reminder letter in the mail two months in advance. If you don't receive a letter, please call our office to schedule the follow-up appointment.

## 2014-11-10 NOTE — Progress Notes (Signed)
Patient Care Team: Guadalupe Maple, MD as PCP - General (Unknown Physician Specialty)   HPI  Barbara Blackburn is a 81 y.o. female Seen in followup for a ICD implanted in the setting of  Non ischemic and  ischemic cardiomyopathy with prior STENTING  and prior congestive heart failure. This was done by way of upgrade It was implanted in the wake of a syncopal episode. Her ejection fraction had been noted to be 20%. Myoview per [facility]) notes 2/14>>EF 50% w inferior scar  She also received a defibrillator with a 6949-lead  Device generator replacement in 2012 she underwent "pirating" of her rate sense pacing lead and capping of the rate sense portion of her ICD lead  She has complaints of fatigue  She also has a history of orthostatic intolerance particularly with standing abruptly. There've been a variety of adjustments to her medications of late.  She has hx of Atrial fibrillation now permanent, with prior stroke treated with warfarin  Past Medical History  Diagnosis Date  . Nonischemic cardiomyopathy   . Coronary artery disease   . Chronic atrial fibrillation   . CHF (congestive heart failure)     class 2  . Degenerative joint disease     severe  . Dyslipidemia   . 6949-lead 11/11/2013  . ICD-Medtronic 05/10/2009    Qualifier: Diagnosis of  By: Lovena Le, MD, [facility], Binnie Kand     Past Surgical History  Procedure Laterality Date  . Medtronic dual-chamber icd  01/05/2006    generator change April 2012  . Pci stent    . Replacement total knee      Current Outpatient Prescriptions  Medication Sig Dispense Refill  . Acetaminophen (TYLENOL PO) Take by mouth as needed.    [name] amLODipine (NORVASC) 5 MG tablet TAKE 1 TABLET (5 MG TOTAL) BY MOUTH DAILY. 30 tablet 1  . furosemide (LASIX) 40 MG tablet Take 40 mg by mouth 1 dose over 46 hours.      [name] glucose blood test strip 1 each by Other route as needed. Use as instructed    . Lancets MISC by Does not apply route every  other day.    . losartan (COZAAR) 50 MG tablet Take 50 mg by mouth daily.    . metoprolol succinate (TOPROL-XL) 100 MG 24 hr tablet Take 100 mg by mouth daily. Take with or immediately following a meal.    . Naproxen Sodium (ALEVE) 220 MG CAPS Take by mouth at bedtime.    . Probiotic Product (ALIGN) 4 MG CAPS Take by mouth daily.    . rosuvastatin (CRESTOR) 10 MG tablet Take 10 mg by mouth daily.    [name] spironolactone (ALDACTONE) 25 MG tablet Take 25 mg by mouth daily.      [name] warfarin (COUMADIN) 2 MG tablet Take 2 mg by mouth as directed.      . warfarin (COUMADIN) 4 MG tablet Take 4 mg by mouth as directed.       No current facility-administered medications for this visit.    Allergies  Allergen Reactions  . Clonazepam   . Fluoxetine Hcl   . Lisinopril   . Zolpidem Tartrate     Review of Systems negative except from HPI and PMH  Physical Exam BP 148/78 mmHg  Pulse 73  Ht 5\' 2"  (1.575 m)  Wt 159 lb 8 oz (72.349 kg)  BMI 29.17 kg/m2 Well developed and nourished in no acute distress HENT normal Neck supple  with JVP 6-8 Clear Regular rate and rhythm, no murmurs or gallops Abd-soft with active BS No Clubbing cyanosis edema Skin-warm and dry A & Oriented  Grossly normal sensory and motor function     Assessment and  Plan  Nonischemic cardiomyopathy  Congestive heart failure-chronic systolic  Syncope  ICD for secondary prevention  6949-lead with capping of the rate sense portion  The patient's device was interrogated.  The information was reviewed. No changes were made in the programming.    Orthostatic intolerance  She complains of significant fatigue. I wonder whether this is related to her metoprolol. Will anticipate switching of her carvedilol.  However, her optivol is markedly increased over the last number of months. This is in the absence of overt volume overload; hence we will undertake a one-week diuretic crease trial 40 daily to twice a day and see what her  optivol does  and see if it makes her symptom  Better.  I worry that this may aggravate orthostatic intolerance which is noted objectively with a 30 beat heart rate increase today.  After that, I would anticipate undertaking a trial to change her beta blocker and see if this is contributing to her symptoms.

## 2014-11-24 DIAGNOSIS — I4891 Unspecified atrial fibrillation: Secondary | ICD-10-CM | POA: Diagnosis not present

## 2014-12-06 ENCOUNTER — Other Ambulatory Visit: Payer: Self-pay | Admitting: Adult Health

## 2014-12-20 NOTE — Op Note (Signed)
PATIENT NAME:  Barbara Blackburn, Barbara Blackburn MR#:  [id number] DATE OF BIRTH:  1928/09/24  DATE OF PROCEDURE:  08/28/2011  PREOPERATIVE DIAGNOSIS: Degenerative arthrosis of the left knee.   POSTOPERATIVE DIAGNOSIS: Degenerative arthrosis of the left knee.   PROCEDURE PERFORMED: Left total knee arthroplasty using computer-assisted navigation.   SURGEON: Laurice Record. Holley Bouche., MD   ASSISTANT: Vance Peper, PA-C (required to maintain retraction throughout the procedure)   ANESTHESIA: Femoral nerve block and spinal.   ESTIMATED BLOOD LOSS: 150 mL.   FLUIDS REPLACED: 1500 mL of Crystalloid.   TOURNIQUET TIME: 77 minutes.   DRAINS: Two medium drains to reinfusion system.   SOFT TISSUE RELEASES: Anterior cruciate ligament, posterior cruciate ligament, deep medial collateral ligament, and patellofemoral ligament.   IMPLANTS UTILIZED: DePuy PFC Sigma size 3 posterior stabilized femoral component (cemented), size 2.5 MBT tibial component (cemented), 32 mm three peg oval dome patella (cemented), and 10 mm stabilized rotating platform polyethylene insert. Gentamicin bone cement was utilized.   INDICATIONS FOR SURGERY: The patient is an 81 year old female who has been seen for complaints of progressive left knee pain. X-rays demonstrated significant degenerative changes in tricompartmental fashion. After discussion of the risks and benefits of surgical intervention, the patient expressed her understanding of the risks and benefits and agreed with plans for surgical intervention.   PROCEDURE IN DETAIL: The patient was brought in the operating room and, after adequate femoral nerve block and spinal anesthesia was achieved, a tourniquet was placed on the patient's upper left thigh. The patient's left knee and leg were cleaned and prepped with alcohol and DuraPrep and draped in the usual sterile fashion. A "time-out" was performed as per usual protocol. The left lower extremity was exsanguinated using an Esmarch and the  tourniquet was inflated to 300 mmHg. Anterior longitudinal incision was made followed by a standard mid vastus approach. A moderate effusion was evacuated. The deep fibers of the medial collateral ligament were elevated in a subperiosteal fashion off the medial flare of the tibia so as to maintain a continuous soft tissue sleeve. The patella was subluxed laterally and the patellofemoral ligament was incised. Inspection of the knee demonstrated significant degenerative changes involving both the medial and lateral compartments as well as changes of the patellofemoral articulation. Prominent osteophytes were debrided using a rongeur. Anterior and posterior cruciate ligaments were excised. Two 4.0 mm Schanz pins were inserted into the femur and into the tibia for attachment of the array of spheres used for computer-assisted navigation. Hip center was identified using circumduction technique. Distal landmarks, proximal tibia, and distal femur were mapped using the computer. Distal femoral cutting guide was positioned using computer-assisted navigation so as to achieve a 5 degree distal valgus cut. Cut was performed and verified using the computer. The distal femur was sized and it was felt that size 3 femoral component was appropriate. Size 3 cutting guide was positioned using computer-assisted navigation and the anterior cut was performed and verified using the computer. This was followed by completion of the posterior and chamfer cuts. Femoral cutting guide for central box was then positioned and central box cut was performed.   Attention was then directed to the proximal tibia. Medial and lateral menisci were excised. The extramedullary tibial cutting guide was positioned using computer-assisted navigation so as to achieve 0 degree varus valgus alignment and 0 degree posterior slope. Cut was performed and verified using the computer. Proximal tibia was sized and it was felt that a size 2.5 tibial tray was  appropriate. Tibial  and femoral trials were inserted followed by insertion of a 10 mm polyethylene insert. Excellent mediolateral soft tissue balancing was appreciated both in full extension and in 90 degrees of flexion. Finally, the patella was cut and prepared so as to accommodate a 32 mm three peg oval dome patella. Patellar trial was placed and the knee was placed through a range of motion with excellent patellar tracking appreciated.   The femoral trial was removed. Central post hole for the tibial component was reamed followed by insertion of a keel punch. Femoral trials were then removed. Cut surfaces of bone were irrigated with copious amounts of normal saline with antibiotic solution using pulsatile lavage and then suctioned dry. Polymethyl methacrylate cement with gentamicin was mixed in the usual fashion using a vacuum mixer. Cement was applied to the cut surface of the proximal tibia as well as along the undersurface of a size 2.5 MBT tibial component. Tibial component was positioned and impacted into place. Excess cement was removed using freer elevators. Cement was then applied to the cut surfaces of femur as well as along the posterior flanges of size 3 posterior stabilized femoral component. Femoral component was positioned and impacted into place. Excess cement was removed using freer elevators. A 10 mm polyethylene trial was inserted and the knee was brought into full extension with steady axial compression applied. Finally, cement was applied to the backside of a 32 mm three peg oval dome patella and the patellar component was positioned and patellar clamp applied. Excess cement was removed using freer elevators.   After adequate curing of cement, the tourniquet was deflated after total tourniquet time of 77 minutes. Hemostasis was achieved using electrocautery. The knee was irrigated with copious amounts of normal saline with antibiotic solution using pulsatile lavage and then suctioned dry.  The knee was checked for any residual cement debris. 30 mL of 0.25% Marcaine with epinephrine was injected along the posterior capsule. A 10 mm stabilized rotating platform polyethylene insert was inserted and the knee was placed through a range of motion. Excellent mediolateral soft tissue balancing was appreciated both in full extension and in 90 degrees of flexion. Two medium drains were placed in the wound bed and brought out through a separate stab incision to be attached to a reinfusion system. The medial parapatellar portion of the incision was reapproximated using interrupted sutures of #1 Vicryl. Subcutaneous tissue was approximated in layers using first #0 Vicryl followed by #2-0 Vicryl. Skin was closed with skin staples. Sterile dressing was applied.   The patient tolerated the procedure well. She was transported to the recovery room in stable condition.   ____________________________ Laurice Record. Holley Bouche., MD [email or web address]:[id number] D: 08/29/2011 13:01:23 ET T: 08/30/2011 10:33:41 ET JOB#: [id number]  cc: Jeneen Rinks P. Holley Bouche., MD, <Dictator> Laurice Record Holley Bouche MD ELECTRONICALLY SIGNED 08/30/2011 17:24

## 2014-12-20 NOTE — Discharge Summary (Signed)
PATIENT NAME:  Barbara Blackburn, Barbara Blackburn MR#:  [id number] DATE OF BIRTH:  [birth year]  DATE OF ADMISSION:  08/28/2011 DATE OF DISCHARGE:  09/01/2011  ADMITTING DIAGNOSIS: Degenerative arthrosis of left knee.   DISCHARGE DIAGNOSES:  1. Degenerative arthrosis of left knee.  2. Urinary tract infection treated with Levaquin.   HISTORY: Patient is an 81 year old female who has been followed at [facility] for progression of left knee pain. The pain was noted to be aggravated with weight-bearing activities. She had localized most of the pain along the medial aspect of the knee. She had been having some difficulty with extending the knee. The patient states that the pain had increased to the point that she was having difficulty with ambulation. She states that she had not really noticed any swelling of the knee. She did not see any significant improvement following cortisone injections performed this past April. She also saw very little improvement with her condition following the Synvisc injections. She did try to modify her activities of daily living. The patient states that the pain increased to the point that it was significantly interfering with her activities of daily living. X-rays taken in the clinic showed narrowing of the medial cartilage space with associated varus alignment. Osteophyte formation was noted. She was noted to have subchondral sclerosis as well. After discussion of the risks and benefits of surgical intervention, the patient expressed her understanding of the risks and benefits and agreed for plans for surgical intervention.   PROCEDURE: Left total knee arthroplasty using computer-assisted navigation.   ANESTHESIA: Femoral nerve block with spinal.   SOFT TISSUE RELEASES: Anterior cruciate ligament, posterior cruciate ligament, deep and medial collateral ligaments as well as the patellofemoral ligament.    IMPLANTS UTILIZED: DePuy PFC Sigma size 3 posterior stabilized femoral component  (cemented), size 2.5 MBT tibial component (cemented), 32 mm three pegged oval dome patella (cemented), and a 10 mm stabilized rotating platform polyethylene insert. Gentamicin bone cement was utilized.   HOSPITAL COURSE: Patient tolerated the procedure very well. She had no complications. She was then taken to PAC-U where she was stabilized then transferred to the orthopedic floor. She began receiving anticoagulation therapy of Lovenox 30 mg subcutaneous every 12 hours per anesthesia and pharmacy protocol. She also was started on Coumadin 10 mg. Her INR was checked on a daily basis. Once it became therapeutic at 2.2 the Lovenox was discontinued. She was placed back on her normal dose of Coumadin. Her INR upon discharge was 2.4. Patient was also fitted with the AV-I compression foot pumps bilaterally. These are set at 130 mmHg. She was fitted with TED stockings bilaterally. These were allowed to be removed one hour per eight hour shift. The left one was applied on day two once the Hemovac was removed and the dressing change. She has had no evidence of any deep venous thromboses. Calves have been nontender. Negative Homans sign. She has had no chest pain or any shortness of breath.   Patient's vital signs have been stable. She has been afebrile. Hemodynamically she was stable. No transfusions were given other than the Autovac transfusions given the first six hours postoperatively. She did have urinalysis performed due to a little bit of confusion on day one and subsequently a urinalysis was done with culture sensitivities. This showed 3+ leukocyte esterase and 1+ bacteria. She was placed on Levaquin. The confusion seemed to clear and she has had no other problems.   Patient began receiving physical therapy day one for gait training and  transfers. Upon being discharged was ambulating greater than 100 feet. Was able to go up and down four sets of steps. Was independent with bed to chair transfers. Occupational  therapy was also initiated on day one for activities of daily living and assistive devices.   Patient's IV, Foley and Hemovac were discontinued on day two along with a dressing change. The Polar Care was reapplied to the surgical leg maintaining a temperature of 40 to 50 degrees Fahrenheit. The wound has been free of any drainage or any signs of infection.   DISPOSITION: Patient is being discharged to home in improved stable condition.   DISCHARGE INSTRUCTIONS:  1. She may weight bear as tolerated.  2. Continue with TED stockings. These are to be worn during the day but may be removed at night.  3. Continue Polar Care maintaining a temperature of 40 to 50 degrees Fahrenheit.  4. She is to continue using a walker until cleared by physical therapy to go to a quad cane.  5. She will receive home health physical therapy.  6. She is placed on a regular diet.  7. She is to follow up in the clinic in two weeks. In the meantime physical therapy will change the dressing as needed.  8. She is to resume her regular medication that she was on prior to admission. She was given a prescription for oxycodone 5 to 10 mg every 4 to 6 hours p.r.n. for pain as well as Ultram 1 to 2 tablets every 4 to 6 hours p.r.n. for pain. She is to continue with her Coumadin that she was having at home. No Lovenox was sent home with the patient. She was given a prescription for a bedside commode and patient states that she did have a walker at home and did not a prescription for this.   PAST MEDICAL HISTORY:  1. History of atrial fibrillation. 2. Mini strokes. 3. Coronary artery disease.   4. Cardiomyopathy.  5. Colon cancer.  6. Defibrillator. 7. Depression. 8. Diabetes. 9. Hypertension. 10. Myocardial infarct.   ____________________________ Vance Peper, PA jrw:cms D: 09/01/2011 08:10:56 ET T: 09/04/2011 09:40:41 ET JOB#: [id number]  cc: Vance Peper, PA, <Dictator> JON WOLFE PA ELECTRONICALLY SIGNED 09/05/2011 7:43

## 2014-12-22 DIAGNOSIS — I4891 Unspecified atrial fibrillation: Secondary | ICD-10-CM | POA: Diagnosis not present

## 2014-12-28 DIAGNOSIS — Z08 Encounter for follow-up examination after completed treatment for malignant neoplasm: Secondary | ICD-10-CM | POA: Diagnosis not present

## 2014-12-28 DIAGNOSIS — Z1283 Encounter for screening for malignant neoplasm of skin: Secondary | ICD-10-CM | POA: Diagnosis not present

## 2014-12-28 DIAGNOSIS — L570 Actinic keratosis: Secondary | ICD-10-CM | POA: Diagnosis not present

## 2014-12-28 DIAGNOSIS — Z85828 Personal history of other malignant neoplasm of skin: Secondary | ICD-10-CM | POA: Diagnosis not present

## 2014-12-28 DIAGNOSIS — C4441 Basal cell carcinoma of skin of scalp and neck: Secondary | ICD-10-CM | POA: Diagnosis not present

## 2014-12-28 DIAGNOSIS — C44319 Basal cell carcinoma of skin of other parts of face: Secondary | ICD-10-CM | POA: Diagnosis not present

## 2014-12-28 DIAGNOSIS — D485 Neoplasm of uncertain behavior of skin: Secondary | ICD-10-CM | POA: Diagnosis not present

## 2015-01-07 DIAGNOSIS — E119 Type 2 diabetes mellitus without complications: Secondary | ICD-10-CM | POA: Diagnosis not present

## 2015-01-07 DIAGNOSIS — H2513 Age-related nuclear cataract, bilateral: Secondary | ICD-10-CM | POA: Diagnosis not present

## 2015-01-07 LAB — HM DIABETES EYE EXAM

## 2015-01-13 DIAGNOSIS — C44319 Basal cell carcinoma of skin of other parts of face: Secondary | ICD-10-CM | POA: Diagnosis not present

## 2015-01-13 DIAGNOSIS — C4491 Basal cell carcinoma of skin, unspecified: Secondary | ICD-10-CM | POA: Diagnosis not present

## 2015-01-19 DIAGNOSIS — I482 Chronic atrial fibrillation: Secondary | ICD-10-CM | POA: Diagnosis not present

## 2015-02-02 DIAGNOSIS — I482 Chronic atrial fibrillation: Secondary | ICD-10-CM | POA: Diagnosis not present

## 2015-02-09 ENCOUNTER — Ambulatory Visit (INDEPENDENT_AMBULATORY_CARE_PROVIDER_SITE_OTHER): Payer: Medicare Other | Admitting: *Deleted

## 2015-02-09 DIAGNOSIS — I4891 Unspecified atrial fibrillation: Secondary | ICD-10-CM | POA: Diagnosis not present

## 2015-02-09 DIAGNOSIS — I5022 Chronic systolic (congestive) heart failure: Secondary | ICD-10-CM | POA: Diagnosis not present

## 2015-02-09 NOTE — Progress Notes (Signed)
Remote ICD transmission.   

## 2015-02-10 DIAGNOSIS — C44319 Basal cell carcinoma of skin of other parts of face: Secondary | ICD-10-CM | POA: Diagnosis not present

## 2015-02-13 LAB — CUP PACEART REMOTE DEVICE CHECK
Brady Statistic AP VP Percent: 6.29 %
Brady Statistic AP VS Percent: 1.16 %
Brady Statistic AS VP Percent: 41.03 %
Brady Statistic RA Percent Paced: 7.45 %
Brady Statistic RV Percent Paced: 47.32 %
HIGH POWER IMPEDANCE MEASURED VALUE: 47 Ohm
HighPow Impedance: 475 Ohm
HighPow Impedance: 61 Ohm
Lead Channel Impedance Value: 418 Ohm
Lead Channel Pacing Threshold Amplitude: 1.375 V
Lead Channel Pacing Threshold Pulse Width: 0.4 ms
Lead Channel Sensing Intrinsic Amplitude: 3.25 mV
Lead Channel Sensing Intrinsic Amplitude: 3.25 mV
Lead Channel Setting Pacing Amplitude: 2.75 V
Lead Channel Setting Pacing Pulse Width: 0.4 ms
Lead Channel Setting Sensing Sensitivity: 0.3 mV
MDC IDC MSMT BATTERY VOLTAGE: 2.93 V
MDC IDC MSMT LEADCHNL RA SENSING INTR AMPL: 0.375 mV
MDC IDC MSMT LEADCHNL RA SENSING INTR AMPL: 0.375 mV
MDC IDC MSMT LEADCHNL RV IMPEDANCE VALUE: 551 Ohm
MDC IDC SESS DTM: 20160614052207
MDC IDC SET LEADCHNL RV PACING AMPLITUDE: 2.75 V
MDC IDC SET ZONE DETECTION INTERVAL: 300 ms
MDC IDC STAT BRADY AS VS PERCENT: 51.52 %
Zone Setting Detection Interval: 350 ms
Zone Setting Detection Interval: 350 ms
Zone Setting Detection Interval: 380 ms

## 2015-02-15 ENCOUNTER — Ambulatory Visit (INDEPENDENT_AMBULATORY_CARE_PROVIDER_SITE_OTHER): Payer: Medicare Other | Admitting: Unknown Physician Specialty

## 2015-02-15 ENCOUNTER — Encounter: Payer: Self-pay | Admitting: Unknown Physician Specialty

## 2015-02-15 VITALS — BP 152/79 | HR 77 | Temp 98.2°F | Ht 60.1 in | Wt 163.6 lb

## 2015-02-15 DIAGNOSIS — Z9181 History of falling: Secondary | ICD-10-CM | POA: Diagnosis not present

## 2015-02-15 DIAGNOSIS — I10 Essential (primary) hypertension: Secondary | ICD-10-CM | POA: Diagnosis not present

## 2015-02-15 DIAGNOSIS — R5382 Chronic fatigue, unspecified: Secondary | ICD-10-CM

## 2015-02-15 DIAGNOSIS — E7800 Pure hypercholesterolemia, unspecified: Secondary | ICD-10-CM | POA: Insufficient documentation

## 2015-02-15 NOTE — Progress Notes (Signed)
BP 152/79 mmHg  Pulse 77  Temp(Src) 98.2 F (36.8 C)  Ht 5' 0.1" (1.527 m)  Wt 163 lb 9.6 oz (74.208 kg)  BMI 31.83 kg/m2  SpO2 96%  LMP  (LMP Unknown)   Subjective:    Patient ID: Barbara Blackburn, female    DOB: [birth year], 81 y.o.   MRN: [id number]  HPI: Barbara Blackburn is a 81 y.o. female  Chief Complaint  Patient presents with  . Hyperlipidemia  . Hypertension   1.  HYPERTENSION / [place] Patient is requesting refill(s). Satisfied with current treatment?  yes  H6  Duration of hypertension:  chronic  H4  BP monitoring frequency:  occasional  H5  BP range:  120s / 70s H3 BP medication side effects:  no P1 Duration of hyperlipidemia:  chronic  H4  Cholesterol medication side effects:  no P1  Cholesterol supplements:  none  P1 Medication compliance:  excellent compliance  P1  Aspirin:  Not taking aspirin on coumadin  Recent stressors:  no  H6   Recurrent headaches:  no  R10 Visual changes:  no  R2  Palpitations:  no  R4  Dyspnea:  no  R5  Chest pain:  yes  R4 has defibrillator Lower extremity edema:  yes  R4 , after long periods of standing Dizzy/lightheaded:   no  R10   2.  CORONARY ARTERY DISEASE Per Dr. Josefa Half Angina frequency:  none H5 Chest pain:  yes  R4 with defibrillator Aggravating factors:  none H7 Edema:  yes  H8 after standing long periods Orthopnea:  no  H8 Paroxysmal nocturnal dyspnea:  no  H8 Dyspnea on exertion:  no  H8 Pneumovax:  Up to date  P1 Followed by cardiology  Relevant past medical, surgical, family and social history reviewed and updated as indicated. Interim medical history since our last visit reviewed. Allergies and medications reviewed and updated.  Review of Systems  Constitutional:       "I don't have energy to do anything"    Per HPI unless specifically indicated above     Objective:    BP 152/79 mmHg  Pulse 77  Temp(Src) 98.2 F (36.8 C)  Ht 5' 0.1" (1.527 m)  Wt 163 lb 9.6 oz (74.208 kg)  BMI  31.83 kg/m2  SpO2 96%  LMP  (LMP Unknown)  Wt Readings from Last 3 Encounters:  02/15/15 163 lb 9.6 oz (74.208 kg)  11/10/14 159 lb 8 oz (72.349 kg)  11/11/13 142 lb 12 oz (64.751 kg)    Physical Exam  Constitutional: She is oriented to person, place, and time. She appears well-developed and well-nourished. No distress.  HENT:  Head: Normocephalic and atraumatic.  Eyes: Conjunctivae and lids are normal. Right eye exhibits no discharge. Left eye exhibits no discharge. No scleral icterus.  Cardiovascular: Normal rate and regular rhythm.   Pulmonary/Chest: Effort normal. No respiratory distress.  Abdominal: Normal appearance. There is no splenomegaly or hepatomegaly. There is no tenderness.  Musculoskeletal: Normal range of motion.  Neurological: She is alert and oriented to person, place, and time.  Skin: Skin is intact. No rash noted. No pallor.  Psychiatric: She has a normal mood and affect. Her behavior is normal. Judgment and thought content normal.       Assessment & Plan:   Problem List Items Addressed This Visit      Cardiovascular and Mediastinum   Essential hypertension, benign - Primary   Relevant Orders   Lipid Panel w/o Chol/HDL Ratio  Comprehensive metabolic panel     Other   Hypercholesterolemia   Relevant Orders   Lipid Panel w/o Chol/HDL Ratio    Other Visit Diagnoses    Chronic fatigue        this has been chronic and not new    Risk for falls        Information given.  Daughter lives next door.  Does not want anyone to come to the house for PT        Follow up plan: Return in about 3 months (around 05/18/2015) for medicare pe.

## 2015-02-15 NOTE — Patient Instructions (Signed)

## 2015-02-16 ENCOUNTER — Telehealth: Payer: Self-pay

## 2015-02-16 LAB — COMPREHENSIVE METABOLIC PANEL
ALT: 12 IU/L (ref 0–32)
AST: 23 IU/L (ref 0–40)
Albumin/Globulin Ratio: 2.4 (ref 1.1–2.5)
Albumin: 4.4 g/dL (ref 3.5–4.7)
Alkaline Phosphatase: 78 IU/L (ref 39–117)
BUN / CREAT RATIO: 28 — AB (ref 11–26)
BUN: 24 mg/dL (ref 8–27)
Bilirubin Total: 0.6 mg/dL (ref 0.0–1.2)
CALCIUM: 9.1 mg/dL (ref 8.7–10.3)
CO2: 24 mmol/L (ref 18–29)
Chloride: 101 mmol/L (ref 97–108)
Creatinine, Ser: 0.87 mg/dL (ref 0.57–1.00)
GFR calc Af Amer: 71 mL/min/{1.73_m2} (ref >59)
GFR calc non Af Amer: 62 mL/min/{1.73_m2} (ref >59)
Globulin, Total: 1.8 g/dL (ref 1.5–4.5)
Glucose: 102 mg/dL — ABNORMAL HIGH (ref 65–99)
POTASSIUM: 4.3 mmol/L (ref 3.5–5.2)
SODIUM: 143 mmol/L (ref 134–144)
Total Protein: 6.2 g/dL (ref 6.0–8.5)

## 2015-02-16 LAB — LIPID PANEL W/O CHOL/HDL RATIO
CHOLESTEROL TOTAL: 146 mg/dL (ref 100–199)
HDL: 59 mg/dL (ref >39)
LDL Calculated: 60 mg/dL (ref 0–99)
Triglycerides: 136 mg/dL (ref 0–149)
VLDL Cholesterol Cal: 27 mg/dL (ref 5–40)

## 2015-02-16 NOTE — Telephone Encounter (Signed)
Patient notified of results.

## 2015-02-16 NOTE — Telephone Encounter (Signed)
-----   Message from Kathrine Haddock, NP sent at 02/16/2015  9:21 AM EDT ----- Please call and let her know all her labs look really good.

## 2015-03-02 ENCOUNTER — Other Ambulatory Visit: Payer: Self-pay | Admitting: Unknown Physician Specialty

## 2015-03-02 DIAGNOSIS — I482 Chronic atrial fibrillation: Secondary | ICD-10-CM | POA: Diagnosis not present

## 2015-03-05 ENCOUNTER — Encounter: Payer: Self-pay | Admitting: Cardiology

## 2015-03-05 ENCOUNTER — Encounter: Payer: Self-pay | Admitting: Internal Medicine

## 2015-03-11 ENCOUNTER — Encounter: Payer: Self-pay | Admitting: Internal Medicine

## 2015-03-30 ENCOUNTER — Other Ambulatory Visit: Payer: Self-pay | Admitting: Internal Medicine

## 2015-03-30 DIAGNOSIS — I482 Chronic atrial fibrillation: Secondary | ICD-10-CM | POA: Diagnosis not present

## 2015-04-05 DIAGNOSIS — D485 Neoplasm of uncertain behavior of skin: Secondary | ICD-10-CM | POA: Diagnosis not present

## 2015-04-05 DIAGNOSIS — L98499 Non-pressure chronic ulcer of skin of other sites with unspecified severity: Secondary | ICD-10-CM | POA: Diagnosis not present

## 2015-04-16 DIAGNOSIS — L988 Other specified disorders of the skin and subcutaneous tissue: Secondary | ICD-10-CM | POA: Diagnosis not present

## 2015-04-20 DIAGNOSIS — I482 Chronic atrial fibrillation: Secondary | ICD-10-CM | POA: Diagnosis not present

## 2015-05-11 ENCOUNTER — Encounter: Payer: Self-pay | Admitting: Internal Medicine

## 2015-05-11 ENCOUNTER — Ambulatory Visit (INDEPENDENT_AMBULATORY_CARE_PROVIDER_SITE_OTHER): Payer: Medicare Other | Admitting: *Deleted

## 2015-05-11 DIAGNOSIS — I5022 Chronic systolic (congestive) heart failure: Secondary | ICD-10-CM

## 2015-05-11 DIAGNOSIS — I4891 Unspecified atrial fibrillation: Secondary | ICD-10-CM

## 2015-05-11 NOTE — Progress Notes (Signed)
Remote ICD transmission.   

## 2015-05-18 DIAGNOSIS — L821 Other seborrheic keratosis: Secondary | ICD-10-CM | POA: Diagnosis not present

## 2015-05-18 DIAGNOSIS — L988 Other specified disorders of the skin and subcutaneous tissue: Secondary | ICD-10-CM | POA: Diagnosis not present

## 2015-05-19 DIAGNOSIS — I10 Essential (primary) hypertension: Secondary | ICD-10-CM | POA: Diagnosis not present

## 2015-05-19 DIAGNOSIS — I482 Chronic atrial fibrillation: Secondary | ICD-10-CM | POA: Diagnosis not present

## 2015-05-19 DIAGNOSIS — I495 Sick sinus syndrome: Secondary | ICD-10-CM | POA: Diagnosis not present

## 2015-05-19 DIAGNOSIS — I2510 Atherosclerotic heart disease of native coronary artery without angina pectoris: Secondary | ICD-10-CM | POA: Diagnosis not present

## 2015-05-19 DIAGNOSIS — I259 Chronic ischemic heart disease, unspecified: Secondary | ICD-10-CM | POA: Diagnosis not present

## 2015-05-20 LAB — CUP PACEART REMOTE DEVICE CHECK
Battery Voltage: 2.88 V
Brady Statistic AS VP Percent: 45.22 %
Brady Statistic RA Percent Paced: 10.94 %
Date Time Interrogation Session: 20160913062828
HIGH POWER IMPEDANCE MEASURED VALUE: 456 Ohm
HIGH POWER IMPEDANCE MEASURED VALUE: 50 Ohm
HighPow Impedance: 63 Ohm
Implantable Lead Location: 753859
Implantable Lead Location: 753860
Implantable Lead Model: 5076
Lead Channel Impedance Value: 456 Ohm
Lead Channel Impedance Value: 551 Ohm
Lead Channel Pacing Threshold Amplitude: 0.875 V
Lead Channel Sensing Intrinsic Amplitude: 0.5 mV
Lead Channel Sensing Intrinsic Amplitude: 0.5 mV
Lead Channel Sensing Intrinsic Amplitude: 1.375 mV
Lead Channel Setting Sensing Sensitivity: 0.3 mV
MDC IDC LEAD IMPLANT DT: 20070511
MDC IDC LEAD IMPLANT DT: 20070511
MDC IDC LEAD IMPLANT DT: 20070511
MDC IDC LEAD LOCATION: 753860
MDC IDC LEAD MODEL: 6949
MDC IDC MSMT LEADCHNL RV PACING THRESHOLD PULSEWIDTH: 0.4 ms
MDC IDC MSMT LEADCHNL RV SENSING INTR AMPL: 1.375 mV
MDC IDC SET LEADCHNL RA PACING AMPLITUDE: 2.75 V
MDC IDC SET LEADCHNL RV PACING AMPLITUDE: 2.5 V
MDC IDC SET LEADCHNL RV PACING PULSEWIDTH: 0.4 ms
MDC IDC SET ZONE DETECTION INTERVAL: 300 ms
MDC IDC SET ZONE DETECTION INTERVAL: 350 ms
MDC IDC SET ZONE DETECTION INTERVAL: 350 ms
MDC IDC STAT BRADY AP VP PERCENT: 9.37 %
MDC IDC STAT BRADY AP VS PERCENT: 1.58 %
MDC IDC STAT BRADY AS VS PERCENT: 43.83 %
MDC IDC STAT BRADY RV PERCENT PACED: 54.59 %
Zone Setting Detection Interval: 380 ms

## 2015-05-24 ENCOUNTER — Encounter: Payer: Self-pay | Admitting: Unknown Physician Specialty

## 2015-05-24 ENCOUNTER — Ambulatory Visit (INDEPENDENT_AMBULATORY_CARE_PROVIDER_SITE_OTHER): Payer: Medicare Other | Admitting: Unknown Physician Specialty

## 2015-05-24 VITALS — BP 139/85 | HR 83 | Temp 97.6°F | Ht 60.6 in | Wt 166.2 lb

## 2015-05-24 DIAGNOSIS — E7800 Pure hypercholesterolemia, unspecified: Secondary | ICD-10-CM

## 2015-05-24 DIAGNOSIS — Z23 Encounter for immunization: Secondary | ICD-10-CM | POA: Diagnosis not present

## 2015-05-24 DIAGNOSIS — I10 Essential (primary) hypertension: Secondary | ICD-10-CM | POA: Diagnosis not present

## 2015-05-24 DIAGNOSIS — Z0000 Encounter for general adult medical examination without abnormal findings: Secondary | ICD-10-CM

## 2015-05-24 MED ORDER — ROSUVASTATIN CALCIUM 10 MG PO TABS: 10.0000 mg | ORAL_TABLET | Freq: Every day | ORAL | Status: DC

## 2015-05-24 NOTE — Assessment & Plan Note (Signed)
Stable, continue present medications.   

## 2015-05-24 NOTE — Progress Notes (Signed)
BP 139/85 mmHg  Pulse 83  Temp(Src) 97.6 F (36.4 C)  Ht 5' 0.6" (1.539 m)  Wt 166 lb 3.2 oz (75.388 kg)  BMI 31.83 kg/m2  SpO2 96%   Subjective:    Patient ID: Barbara Blackburn, female    DOB: 03-28-1929, 81 y.o.   MRN: [id number]  HPI: Barbara Blackburn is a 81 y.o. female  Chief Complaint  Patient presents with  . Annual Exam  . Medication Refill    pt states she needs crestor refilled, last time 15 tablets were dispensed and she would like to have 30   Medicare wellness Relevant past medical, surgical, family and social history reviewed and updated as indicated. Interim medical history since our last visit reviewed. Allergies and medications reviewed and updated.  See functional status, depression screen, and fall's risk assessment  under the appropriate section.    Eye exam through optometry this year  Pt is able to recall items, recognize clock face, recognize time and do a 3 item recall.    Hypertension This is a new problem. The current episode started today. The problem is controlled. Pertinent negatives include no anxiety, blurred vision, chest pain, headaches, malaise/fatigue, neck pain, orthopnea, palpitations, peripheral edema, PND, shortness of breath or sweats. There are no known risk factors for coronary artery disease. Past treatments include nothing. There are no compliance problems.  Hypertensive end-organ damage includes CAD/MI.  Hyperlipidemia This is a chronic problem. This is a new diagnosis. The problem is controlled. Recent lipid tests were reviewed and are normal. Pertinent negatives include no chest pain, myalgias or shortness of breath. There are no compliance problems.       Relevant past medical, surgical, family and social history reviewed and updated as indicated. Interim medical history since our last visit reviewed. Allergies and medications reviewed and updated.  Review of Systems  Constitutional: Negative for malaise/fatigue.  Eyes:  Negative for blurred vision.  Respiratory: Negative for shortness of breath.   Cardiovascular: Negative for chest pain, palpitations, orthopnea and PND.  Musculoskeletal: Negative for myalgias and neck pain.  Neurological: Negative for headaches.    Per HPI unless specifically indicated above     Objective:    BP 139/85 mmHg  Pulse 83  Temp(Src) 97.6 F (36.4 C)  Ht 5' 0.6" (1.539 m)  Wt 166 lb 3.2 oz (75.388 kg)  BMI 31.83 kg/m2  SpO2 96%  Wt Readings from Last 3 Encounters:  05/24/15 166 lb 3.2 oz (75.388 kg)  02/15/15 163 lb 9.6 oz (74.208 kg)  11/10/14 159 lb 8 oz (72.349 kg)    Physical Exam  Constitutional: She is oriented to person, place, and time. She appears well-developed and well-nourished. No distress.  HENT:  Head: Normocephalic and atraumatic.  Eyes: Conjunctivae and lids are normal. Right eye exhibits no discharge. Left eye exhibits no discharge. No scleral icterus.  Neck: Normal range of motion. Neck supple.  Cardiovascular: Normal rate, regular rhythm and normal heart sounds.   Pulmonary/Chest: Effort normal and breath sounds normal. No respiratory distress.  Abdominal: Normal appearance. There is no splenomegaly or hepatomegaly.  Musculoskeletal: Normal range of motion.  Neurological: She is alert and oriented to person, place, and time.  Skin: Skin is intact. No rash noted. No pallor.  Psychiatric: She has a normal mood and affect. Her behavior is normal. Judgment and thought content normal.   Does not want to life support but does want to be a full code.    Results for  orders placed or performed in visit on 02/15/15  Lipid Panel w/o Chol/HDL Ratio  Result Value Ref Range   Cholesterol, Total 146 100 - 199 mg/dL   Triglycerides 136 0 - 149 mg/dL   HDL 59 >39 mg/dL   VLDL Cholesterol Cal 27 5 - 40 mg/dL   LDL Calculated 60 0 - 99 mg/dL  Comprehensive metabolic panel  Result Value Ref Range   Glucose 102 (H) 65 - 99 mg/dL   BUN 24 8 - 27 mg/dL    Creatinine, Ser 0.87 0.57 - 1.00 mg/dL   GFR calc non Af Amer 62 >59 mL/min/1.73   GFR calc Af Amer 71 >59 mL/min/1.73   BUN/Creatinine Ratio 28 (H) 11 - 26   Sodium 143 134 - 144 mmol/L   Potassium 4.3 3.5 - 5.2 mmol/L   Chloride 101 97 - 108 mmol/L   CO2 24 18 - 29 mmol/L   Calcium 9.1 8.7 - 10.3 mg/dL   Total Protein 6.2 6.0 - 8.5 g/dL   Albumin 4.4 3.5 - 4.7 g/dL   Globulin, Total 1.8 1.5 - 4.5 g/dL   Albumin/Globulin Ratio 2.4 1.1 - 2.5   Bilirubin Total 0.6 0.0 - 1.2 mg/dL   Alkaline Phosphatase 78 39 - 117 IU/L   AST 23 0 - 40 IU/L   ALT 12 0 - 32 IU/L  HM DIABETES EYE EXAM  Result Value Ref Range   HM Diabetic Eye Exam No Retinopathy No Retinopathy      Assessment & Plan:   Problem List Items Addressed This Visit      Unprioritized   Essential hypertension, benign    Stable, continue present medications.       Relevant Medications   rosuvastatin (CRESTOR) 10 MG tablet   Hypercholesterolemia    Stable, continue present medications.       Relevant Medications   rosuvastatin (CRESTOR) 10 MG tablet    Other Visit Diagnoses    Immunization due    -  Primary    Relevant Orders    Flu Vaccine QUAD 36+ mos PF IM (Fluarix & Fluzone Quad PF) (Completed)    Annual physical exam           Information given on advance directive.    Follow up plan: Return in about 3 months (around 08/23/2015).

## 2015-05-24 NOTE — Patient Instructions (Signed)
Advance Directive Advance directives are the legal documents that allow you to make choices about your health care and medical treatment if you cannot speak for yourself. Advance directives are a way for you to communicate your wishes to family, friends, and health care providers. The specified people can then convey your decisions about end-of-life care to avoid confusion if you should become unable to communicate. Ideally, the process of discussing and writing advance directives should happen over time rather than making decisions all at once. Advance directives can be modified as your situation changes, and you can change your mind at any time, even after you have signed the advance directives. Each state has its own laws regarding advance directives. You may want to check with your health care provider, attorney, or state representative about the law in your state. Below are some examples of advance directives. LIVING WILL A living will is a set of instructions documenting your wishes about medical care when you cannot care for yourself. It is used if you become:  Terminally ill.  Incapacitated.  Unable to communicate.  Unable to make decisions. Items to consider in your living will include:  The use or non-use of life-sustaining equipment, such as dialysis machines and breathing machines (ventilators).  A do not resuscitate (DNR) order, which is the instruction not to use cardiopulmonary resuscitation (CPR) if breathing or heartbeat stops.  Tube feeding.  Withholding of food and fluids.  Comfort (palliative) care when the goal becomes comfort rather than a cure.  Organ and tissue donation. A living will does not give instructions about distribution of your money and property if you should pass away. It is advisable to seek the expert advice of a lawyer in drawing up a will regarding your possessions. Decisions about taxes, beneficiaries, and asset distribution will be legally binding.  This process can relieve your family and friends of any burdens surrounding disputes or questions that may come up about the allocation of your assets. DO NOT RESUSCITATE (DNR) A do not resuscitate (DNR) order is a request to not have CPR in the event that your heart stops beating or you stop breathing. Unless given other instructions, a health care provider will try to help any patient whose heart has stopped or who has stopped breathing.  HEALTH CARE PROXY AND DURABLE POWER OF ATTORNEY FOR HEALTH CARE A health care proxy is a person (agent) appointed to make medical decisions for you if you cannot. Generally, people choose someone they know well and trust to represent their preferences when they can no longer do so. You should be sure to ask this person for agreement to act as your agent. An agent may have to exercise judgment in the event of a medical decision for which your wishes are not known. The durable power of attorney for health care is the legal document that names your health care proxy. Once written, it should be:  Signed.  Notarized.  Dated.  Copied.  Witnessed.  Incorporated into your medical record. You may also want to appoint someone to manage your financial affairs if you cannot. This is called a durable power of attorney for finances. It is a separate legal document from the durable power of attorney for health care. You may choose the same person or someone different from your health care proxy to act as your agent in financial matters. Document Released: 11/21/2007 Document Revised: 08/19/2013 Document Reviewed: 01/01/2013 ExitCare Patient Information 2015 [employer]. This information is not intended to replace advice   to you by your health care provider. Make sure you discuss any questions you have with your health care provider.  

## 2015-05-25 ENCOUNTER — Other Ambulatory Visit: Payer: Self-pay | Admitting: Family Medicine

## 2015-05-25 NOTE — Telephone Encounter (Signed)
Your patient 

## 2015-06-10 ENCOUNTER — Encounter: Payer: Self-pay | Admitting: Cardiology

## 2015-06-17 DIAGNOSIS — I482 Chronic atrial fibrillation: Secondary | ICD-10-CM | POA: Diagnosis not present

## 2015-07-19 ENCOUNTER — Other Ambulatory Visit: Payer: Self-pay | Admitting: Internal Medicine

## 2015-07-19 ENCOUNTER — Telehealth: Payer: Self-pay | Admitting: Internal Medicine

## 2015-07-19 NOTE — Telephone Encounter (Signed)
New Message  Pt daughter calling to speak w/ RN- pt c/o of Weakness and dizziness- progressively worse. Please call back and discuss.

## 2015-07-19 NOTE — Telephone Encounter (Signed)
I spoke with the patient. She reports symptoms of generalized weakness. She reports that she gets dizzy when she gets up in the mornings, but she has problems with vertigo. She reports weakness in her legs, but she does sit quite a bit per her report. The patient has reported this to her PCP back in September, but I am not clear on what the PCP's response to this was. She sees Dr. Josefa Half as her primary cardiologist. She does not check her BP at home. I advised her I am unclear as to what causes her weakness, but this could be medication induced. I have asked her to follow up with her PCP/ Dr. Josefa Half for continued weakness.

## 2015-07-20 DIAGNOSIS — I482 Chronic atrial fibrillation: Secondary | ICD-10-CM | POA: Diagnosis not present

## 2015-08-10 ENCOUNTER — Ambulatory Visit (INDEPENDENT_AMBULATORY_CARE_PROVIDER_SITE_OTHER): Payer: Medicare Other | Admitting: *Deleted

## 2015-08-10 DIAGNOSIS — I5022 Chronic systolic (congestive) heart failure: Secondary | ICD-10-CM | POA: Diagnosis not present

## 2015-08-10 DIAGNOSIS — I4891 Unspecified atrial fibrillation: Secondary | ICD-10-CM

## 2015-08-10 NOTE — Progress Notes (Signed)
Remote ICD transmission.   

## 2015-08-12 DIAGNOSIS — M7542 Impingement syndrome of left shoulder: Secondary | ICD-10-CM | POA: Diagnosis not present

## 2015-08-12 DIAGNOSIS — Z96652 Presence of left artificial knee joint: Secondary | ICD-10-CM | POA: Diagnosis not present

## 2015-08-14 LAB — CUP PACEART REMOTE DEVICE CHECK
Brady Statistic AP VS Percent: 1.86 %
Brady Statistic AS VP Percent: 48.92 %
HIGH POWER IMPEDANCE MEASURED VALUE: 456 Ohm
HighPow Impedance: 47 Ohm
HighPow Impedance: 60 Ohm
Implantable Lead Implant Date: 20070511
Implantable Lead Location: 753860
Implantable Lead Model: 5092
Lead Channel Impedance Value: 418 Ohm
Lead Channel Pacing Threshold Amplitude: 0.875 V
Lead Channel Pacing Threshold Pulse Width: 0.4 ms
Lead Channel Setting Pacing Amplitude: 2.5 V
Lead Channel Setting Pacing Amplitude: 2.75 V
Lead Channel Setting Pacing Pulse Width: 0.4 ms
Lead Channel Setting Sensing Sensitivity: 0.3 mV
MDC IDC LEAD IMPLANT DT: 20070511
MDC IDC LEAD IMPLANT DT: 20070511
MDC IDC LEAD LOCATION: 753859
MDC IDC LEAD LOCATION: 753860
MDC IDC LEAD MODEL: 6949
MDC IDC MSMT BATTERY VOLTAGE: 2.79 V
MDC IDC MSMT LEADCHNL RA SENSING INTR AMPL: 0.375 mV
MDC IDC MSMT LEADCHNL RA SENSING INTR AMPL: 0.375 mV
MDC IDC MSMT LEADCHNL RV IMPEDANCE VALUE: 551 Ohm
MDC IDC MSMT LEADCHNL RV SENSING INTR AMPL: 2 mV
MDC IDC MSMT LEADCHNL RV SENSING INTR AMPL: 2 mV
MDC IDC SESS DTM: 20161213072709
MDC IDC STAT BRADY AP VP PERCENT: 12.15 %
MDC IDC STAT BRADY AS VS PERCENT: 37.07 %
MDC IDC STAT BRADY RA PERCENT PACED: 14.01 %
MDC IDC STAT BRADY RV PERCENT PACED: 61.07 %

## 2015-08-17 DIAGNOSIS — I482 Chronic atrial fibrillation: Secondary | ICD-10-CM | POA: Diagnosis not present

## 2015-08-18 ENCOUNTER — Encounter: Payer: Self-pay | Admitting: Cardiology

## 2015-09-01 ENCOUNTER — Ambulatory Visit (INDEPENDENT_AMBULATORY_CARE_PROVIDER_SITE_OTHER): Payer: Medicare Other | Admitting: Unknown Physician Specialty

## 2015-09-01 ENCOUNTER — Encounter: Payer: Self-pay | Admitting: Unknown Physician Specialty

## 2015-09-01 VITALS — BP 146/81 | HR 73 | Temp 98.2°F | Ht 59.5 in | Wt 176.2 lb

## 2015-09-01 DIAGNOSIS — E7800 Pure hypercholesterolemia, unspecified: Secondary | ICD-10-CM

## 2015-09-01 DIAGNOSIS — I10 Essential (primary) hypertension: Secondary | ICD-10-CM

## 2015-09-01 LAB — LIPID PANEL PICCOLO, WAIVED
Chol/HDL Ratio Piccolo,Waive: 2.6 mg/dL
Cholesterol Piccolo, Waived: 145 mg/dL (ref <200)
HDL CHOL PICCOLO, WAIVED: 57 mg/dL — AB (ref >59)
LDL CHOL CALC PICCOLO WAIVED: 53 mg/dL (ref <100)
Triglycerides Piccolo,Waived: 182 mg/dL — ABNORMAL HIGH (ref <150)
VLDL CHOL CALC PICCOLO,WAIVE: 36 mg/dL — AB (ref <30)

## 2015-09-01 LAB — MICROALBUMIN, URINE WAIVED
Creatinine, Urine Waived: 100 mg/dL (ref 10–300)
MICROALB, UR WAIVED: 150 mg/L — AB (ref 0–19)

## 2015-09-01 NOTE — Assessment & Plan Note (Signed)
Stable, continue present medications.   

## 2015-09-01 NOTE — Progress Notes (Signed)
   BP 146/81 mmHg  Pulse 73  Temp(Src) 98.2 F (36.8 C)  Ht 4' 11.5" (1.511 m)  Wt 176 lb 3.2 oz (79.924 kg)  BMI 35.01 kg/m2  SpO2 94%  LMP  (LMP Unknown)   Subjective:    Patient ID: Barbara Blackburn, female    DOB: 1929-01-20, 82 y.o.   MRN: [id number]  HPI: Barbara Blackburn is a 82 y.o. female  Chief Complaint  Patient presents with  . Hypertension  . Hyperlipidemia   Hypertension Using medications without difficulty Average home BPs   No problems or lightheadedness No chest pain with exertion or shortness of breath No Edema   Hyperlipidemia Using medications without problems: No Muscle aches  Diet compliance: Exercise:   Relevant past medical, surgical, family and social history reviewed and updated as indicated. Interim medical history since our last visit reviewed. Allergies and medications reviewed and updated.  Review of Systems  Constitutional: Positive for fatigue.  HENT: Negative.   Eyes: Negative.   Respiratory: Negative.   Cardiovascular: Negative.   Gastrointestinal: Negative.   Endocrine: Negative.   Genitourinary: Negative.   Musculoskeletal: Negative.   Skin: Negative.   Allergic/Immunologic: Negative.   Neurological: Negative.   Hematological: Negative.   Psychiatric/Behavioral: Negative.     Per HPI unless specifically indicated above     Objective:    BP 146/81 mmHg  Pulse 73  Temp(Src) 98.2 F (36.8 C)  Ht 4' 11.5" (1.511 m)  Wt 176 lb 3.2 oz (79.924 kg)  BMI 35.01 kg/m2  SpO2 94%  LMP  (LMP Unknown)  Wt Readings from Last 3 Encounters:  09/01/15 176 lb 3.2 oz (79.924 kg)  05/24/15 166 lb 3.2 oz (75.388 kg)  02/15/15 163 lb 9.6 oz (74.208 kg)    Physical Exam  Constitutional: She is oriented to person, place, and time. She appears well-developed and well-nourished.  HENT:  Head: Normocephalic and atraumatic.  Eyes: Pupils are equal, round, and reactive to light. Scleral icterus is present.  Neck: Normal range of  motion. Neck supple. Carotid bruit is not present.  Cardiovascular: Normal rate, regular rhythm and normal heart sounds.  Exam reveals no gallop and no friction rub.   No murmur heard. Pulmonary/Chest: Effort normal and breath sounds normal. No respiratory distress. She has no wheezes. She has no rales.  Genitourinary: There is breast discharge.  Musculoskeletal: Normal range of motion.  Neurological: She is alert and oriented to person, place, and time.  Skin: Skin is warm, dry and intact. No rash noted.  Psychiatric: She has a normal mood and affect. Her speech is normal and behavior is normal. Judgment and thought content normal. Cognition and memory are normal.      Assessment & Plan:   Problem List Items Addressed This Visit      Unprioritized   Essential hypertension, benign - Primary    Stable, continue present medications.        Relevant Medications   warfarin (COUMADIN) 5 MG tablet   Other Relevant Orders   Comprehensive metabolic panel   Microalbumin, Urine Waived   Hypercholesterolemia    Stable. Check Lipid panel      Relevant Medications   warfarin (COUMADIN) 5 MG tablet   Other Relevant Orders   Lipid Panel Piccolo, Waived       Follow up plan: Return in about 6 months (around 02/29/2016) for physical.

## 2015-09-01 NOTE — Assessment & Plan Note (Signed)
Stable. Check Lipid panel

## 2015-09-02 LAB — COMPREHENSIVE METABOLIC PANEL
ALT: 14 IU/L (ref 0–32)
AST: 22 IU/L (ref 0–40)
Albumin/Globulin Ratio: 2.2 (ref 1.1–2.5)
Albumin: 4.3 g/dL (ref 3.5–4.7)
Alkaline Phosphatase: 86 IU/L (ref 39–117)
BUN/Creatinine Ratio: 21 (ref 11–26)
BUN: 17 mg/dL (ref 8–27)
Bilirubin Total: 1 mg/dL (ref 0.0–1.2)
CALCIUM: 9.4 mg/dL (ref 8.7–10.3)
CO2: 23 mmol/L (ref 18–29)
Chloride: 102 mmol/L (ref 96–106)
Creatinine, Ser: 0.8 mg/dL (ref 0.57–1.00)
GFR, EST AFRICAN AMERICAN: 78 mL/min/{1.73_m2} (ref >59)
GFR, EST NON AFRICAN AMERICAN: 68 mL/min/{1.73_m2} (ref >59)
GLUCOSE: 101 mg/dL — AB (ref 65–99)
Globulin, Total: 2 g/dL (ref 1.5–4.5)
POTASSIUM: 4 mmol/L (ref 3.5–5.2)
Sodium: 142 mmol/L (ref 134–144)
TOTAL PROTEIN: 6.3 g/dL (ref 6.0–8.5)

## 2015-09-12 ENCOUNTER — Other Ambulatory Visit: Payer: Self-pay | Admitting: Internal Medicine

## 2015-09-12 ENCOUNTER — Other Ambulatory Visit: Payer: Self-pay | Admitting: Unknown Physician Specialty

## 2015-09-14 DIAGNOSIS — I482 Chronic atrial fibrillation: Secondary | ICD-10-CM | POA: Diagnosis not present

## 2015-10-12 DIAGNOSIS — I482 Chronic atrial fibrillation: Secondary | ICD-10-CM | POA: Diagnosis not present

## 2015-11-09 ENCOUNTER — Ambulatory Visit (INDEPENDENT_AMBULATORY_CARE_PROVIDER_SITE_OTHER): Payer: Medicare Other | Admitting: Internal Medicine

## 2015-11-09 ENCOUNTER — Other Ambulatory Visit
Admission: RE | Admit: 2015-11-09 | Discharge: 2015-11-09 | Disposition: A | Discharge status: Home / Self Care | Payer: Medicare Other | Source: Ambulatory Visit | Attending: Internal Medicine | Admitting: Internal Medicine

## 2015-11-09 ENCOUNTER — Encounter: Payer: Self-pay | Admitting: Internal Medicine

## 2015-11-09 VITALS — BP 132/82 | HR 69 | Ht 61.0 in | Wt 177.0 lb

## 2015-11-09 DIAGNOSIS — R5383 Other fatigue: Secondary | ICD-10-CM | POA: Diagnosis not present

## 2015-11-09 DIAGNOSIS — I4891 Unspecified atrial fibrillation: Secondary | ICD-10-CM

## 2015-11-09 DIAGNOSIS — Z95810 Presence of automatic (implantable) cardiac defibrillator: Secondary | ICD-10-CM

## 2015-11-09 DIAGNOSIS — I5022 Chronic systolic (congestive) heart failure: Secondary | ICD-10-CM | POA: Diagnosis not present

## 2015-11-09 DIAGNOSIS — I10 Essential (primary) hypertension: Secondary | ICD-10-CM

## 2015-11-09 DIAGNOSIS — I110 Hypertensive heart disease with heart failure: Secondary | ICD-10-CM | POA: Insufficient documentation

## 2015-11-09 LAB — BASIC METABOLIC PANEL
ANION GAP: 6 (ref 5–15)
BUN: 31 mg/dL — ABNORMAL HIGH (ref 6–20)
CALCIUM: 9.3 mg/dL (ref 8.9–10.3)
CO2: 25 mmol/L (ref 22–32)
Chloride: 108 mmol/L (ref 101–111)
Creatinine, Ser: 0.83 mg/dL (ref 0.44–1.00)
GLUCOSE: 100 mg/dL — AB (ref 65–99)
POTASSIUM: 4 mmol/L (ref 3.5–5.1)
SODIUM: 139 mmol/L (ref 135–145)

## 2015-11-09 LAB — CBC
HEMATOCRIT: 45.8 % (ref 35.0–47.0)
HEMOGLOBIN: 15.7 g/dL (ref 12.0–16.0)
MCH: 30.3 pg (ref 26.0–34.0)
MCHC: 34.2 g/dL (ref 32.0–36.0)
MCV: 88.4 fL (ref 80.0–100.0)
Platelets: 153 10*3/uL (ref 150–440)
RBC: 5.19 MIL/uL (ref 3.80–5.20)
RDW: 14.9 % — AB (ref 11.5–14.5)
WBC: 8.6 10*3/uL (ref 3.6–11.0)

## 2015-11-09 LAB — SEDIMENTATION RATE: SED RATE: 7 mm/h (ref 0–30)

## 2015-11-09 LAB — TSH: TSH: 3.329 u[IU]/mL (ref 0.350–4.500)

## 2015-11-09 MED ORDER — METOPROLOL SUCCINATE ER 50 MG PO TB24: 50.0000 mg | ORAL_TABLET | Freq: Every day | ORAL | Status: DC

## 2015-11-09 NOTE — Patient Instructions (Signed)
Medication Instructions:  DECREASE metoprolol to 50mg  daily  Labwork: BMET, TSH, CBC, sed rate  Testing/Procedures: Remote monitoring is used to monitor your Pacemaker of ICD from home. This monitoring reduces the number of office visits required to check your device to one time per year. It allows [place] to keep an eye on the functioning of your device to ensure it is working properly. You are scheduled for a device check from home on June 13. You may send your transmission at any time that day. If you have a wireless device, the transmission will be sent automatically. After your physician reviews your transmission, you will receive a postcard with your next transmission date.  Your physician has requested that you have an echocardiogram. Echocardiography is a painless test that uses sound waves to create images of your heart. It provides your doctor with information about the size and shape of your heart and how well your heart's chambers and valves are working. This procedure takes approximately one hour. There are no restrictions for this procedure.    Follow-Up: Your physician wants you to follow-up in: one year with Dr. Caryl Comes. You will receive a reminder letter in the mail two months in advance. If you don't receive a letter, please call our office to schedule the follow-up appointment.   Any Other Special Instructions Will Be Listed Below (If Applicable).     If you need a refill on your cardiac medications before your next appointment, please call your pharmacy.

## 2015-11-09 NOTE — Progress Notes (Signed)
Patient Care Team: Kathrine Haddock, NP as PCP - General (Nurse Practitioner)   HPI  Barbara Blackburn is a 82 y.o. female Seen in followup for a dual chamber ICD implanted in the setting of  Non ischemic and  ischemic cardiomyopathy with prior STENTING  and prior congestive heart failure.  It was implanted in the wake of a syncopal episode. Her ejection fraction had been noted to be 20%. Myoview per Rob Hickman [facility]) notes 2/14>>EF 50% w inferior scar  She had  received a defibrillator with a 6949-lead  Device generator replacement in 2012 she underwent "pirating" of her rate sense pacing lead and capping of the rate sense portion of her ICD lead  She has complaints of fatigue and increasingly poor exercise tolerance  She also has a history of orthostatic intolerance particularly with standing abruptly. There've been a variety of adjustments to her medications of late.  She has hx of Atrial fibrillation now permanent, with prior stroke>> treated with warfarin  Past Medical History  Diagnosis Date  . Nonischemic cardiomyopathy ([place])   . Coronary artery disease   . Chronic atrial fibrillation ([place])   . CHF (congestive heart failure) (HCC)     class 2  . Degenerative joint disease     severe  . Dyslipidemia   . 6949-lead 11/11/2013  . ICD-Medtronic 05/10/2009    Qualifier: Diagnosis of  By: Lovena Le, MD, [facility], Binnie Kand     Past Surgical History  Procedure Laterality Date  . Medtronic dual-chamber icd  01/05/2006    generator change April 2012  . Pci stent    . Replacement total knee      Current Outpatient Prescriptions  Medication Sig Dispense Refill  . ibuprofen (ADVIL,MOTRIN) 800 MG tablet Take 800 mg by mouth every 8 (eight) hours as needed.    [name] amLODipine (NORVASC) 5 MG tablet TAKE 1 TABLET (5 MG TOTAL) BY MOUTH DAILY. 30 tablet 3  . furosemide (LASIX) 40 MG tablet Take 40 mg by mouth 1 dose over 46 hours.      [name] losartan (COZAAR) 50 MG tablet TAKE 1 TABLET BY MOUTH  EVERY DAY 30 tablet 3  . Naproxen Sodium (ALEVE) 220 MG CAPS Take by mouth at bedtime.    . Probiotic Product (ALIGN) 4 MG CAPS Take by mouth daily.    . rosuvastatin (CRESTOR) 10 MG tablet Take 1 tablet (10 mg total) by mouth daily. 30 tablet 3  . spironolactone (ALDACTONE) 25 MG tablet Take 25 mg by mouth daily.      [name] warfarin (COUMADIN) 5 MG tablet Take 5 mg by mouth See admin instructions.  6  . [DISCONTINUED] metoprolol succinate (TOPROL-XL) 100 MG 24 hr tablet TAKE 1 TABLET BY MOUTH EVERY DAY 30 tablet 2   No current facility-administered medications for this visit.    Allergies  Allergen Reactions  . Clonazepam   . Fluoxetine Hcl Other (See Comments)    nightmares  . Lisinopril   . Zolpidem Tartrate Other (See Comments)    Ambien    Review of Systems negative except from HPI and PMH  Physical Exam BP 132/82 mmHg  Pulse 69  Ht 5\' 1"  (1.549 m)  Wt 177 lb (80.287 kg)  BMI 33.46 kg/m2  LMP  (LMP Unknown) Well developed and nourished in no acute distress HENT normal Neck supple with JVP 6-8 Clear Regular rate and rhythm, no murmurs or gallops Abd-soft with active BS No Clubbing cyanosis edema Skin-warm and dry  A & Oriented  Grossly normal sensory and motor function she is sitting in a wheelchair   ECG demonstrates atrial fibrillation 69 Intervals-/10/43 T-wave inversions inferiorly and V2-V3  Assessment and  Plan  Nonischemic cardiomyopathy  Atrial fibrillation permanent  Congestive heart failure-chronic systolic  Syncope  ICD for secondary prevention  6949-lead with capping of the rate sense portion  The patient's device was interrogated.  The information was reviewed. No changes were made in the programming.    Orthostatic intolerance    Will check BMET on high risk med--aldactone and renal funciton on Rivaroxaban   With her fatigue, we'll also check a TSH and a CBC. I'm concerned about her shoulder discomfort which I suspect arthritis but given her  age we'll check a sedimentation rate to exclude polymyalgia.  Given the fatigue will have her undertake a exclusion trial by decreasing her metoprolol succinate 100--50 mg to cc Dr. AP in a couple of weeks.  We'll arrange a 2-D echocardiogram.  She is pacing 50% of the time and this has been fairly even. Recent data from [facility] clinic suggests that pacing more than 20% of the time can be associated with cardiomyopathy.

## 2015-11-10 LAB — CUP PACEART INCLINIC DEVICE CHECK
Battery Voltage: 2.71 V
Brady Statistic AP VP Percent: 11.07 %
Brady Statistic AP VS Percent: 1.72 %
Brady Statistic AS VP Percent: 46.02 %
Brady Statistic AS VS Percent: 41.19 %
Brady Statistic RA Percent Paced: 12.79 %
Brady Statistic RV Percent Paced: 57.09 %
Date Time Interrogation Session: 20170314144144
HighPow Impedance: 456 Ohm
HighPow Impedance: 46 Ohm
HighPow Impedance: 58 Ohm
Implantable Lead Implant Date: 20070511
Implantable Lead Implant Date: 20070511
Implantable Lead Implant Date: 20070511
Implantable Lead Location: 753859
Implantable Lead Location: 753860
Implantable Lead Location: 753860
Implantable Lead Model: 5076
Implantable Lead Model: 5092
Implantable Lead Model: 6949
Lead Channel Impedance Value: 418 Ohm
Lead Channel Impedance Value: 551 Ohm
Lead Channel Pacing Threshold Amplitude: 1 V
Lead Channel Pacing Threshold Pulse Width: 0.4 ms
Lead Channel Sensing Intrinsic Amplitude: 0.375 mV
Lead Channel Sensing Intrinsic Amplitude: 2.75 mV
Lead Channel Setting Pacing Amplitude: 2.5 V
Lead Channel Setting Pacing Pulse Width: 0.4 ms
Lead Channel Setting Sensing Sensitivity: 0.3 mV

## 2015-11-11 ENCOUNTER — Encounter: Payer: Medicare Other | Admitting: Internal Medicine

## 2015-11-11 DIAGNOSIS — I482 Chronic atrial fibrillation: Secondary | ICD-10-CM | POA: Diagnosis not present

## 2015-11-23 ENCOUNTER — Encounter: Payer: Self-pay | Admitting: Internal Medicine

## 2015-11-30 ENCOUNTER — Ambulatory Visit (INDEPENDENT_AMBULATORY_CARE_PROVIDER_SITE_OTHER): Payer: Medicare Other

## 2015-11-30 ENCOUNTER — Other Ambulatory Visit: Payer: Self-pay

## 2015-11-30 DIAGNOSIS — I4891 Unspecified atrial fibrillation: Secondary | ICD-10-CM | POA: Diagnosis not present

## 2015-11-30 DIAGNOSIS — I10 Essential (primary) hypertension: Secondary | ICD-10-CM

## 2015-11-30 DIAGNOSIS — I2510 Atherosclerotic heart disease of native coronary artery without angina pectoris: Secondary | ICD-10-CM | POA: Diagnosis not present

## 2015-11-30 DIAGNOSIS — I259 Chronic ischemic heart disease, unspecified: Secondary | ICD-10-CM | POA: Diagnosis not present

## 2015-11-30 DIAGNOSIS — I5022 Chronic systolic (congestive) heart failure: Secondary | ICD-10-CM | POA: Diagnosis not present

## 2015-11-30 DIAGNOSIS — R5383 Other fatigue: Secondary | ICD-10-CM

## 2015-11-30 DIAGNOSIS — I480 Paroxysmal atrial fibrillation: Secondary | ICD-10-CM | POA: Diagnosis not present

## 2015-12-09 DIAGNOSIS — I482 Chronic atrial fibrillation: Secondary | ICD-10-CM | POA: Diagnosis not present

## 2015-12-14 ENCOUNTER — Other Ambulatory Visit: Payer: Self-pay | Admitting: *Deleted

## 2015-12-14 MED ORDER — AMLODIPINE BESYLATE 5 MG PO TABS: ORAL_TABLET | ORAL | Status: DC

## 2016-01-06 ENCOUNTER — Ambulatory Visit (INDEPENDENT_AMBULATORY_CARE_PROVIDER_SITE_OTHER): Payer: Medicare Other

## 2016-01-06 ENCOUNTER — Encounter: Payer: Self-pay | Admitting: *Deleted

## 2016-01-06 ENCOUNTER — Ambulatory Visit
Admission: EM | Admit: 2016-01-06 | Discharge: 2016-01-06 | Disposition: A | Discharge status: Home / Self Care | Payer: Medicare Other | Attending: Family Medicine | Admitting: Family Medicine

## 2016-01-06 DIAGNOSIS — R0602 Shortness of breath: Secondary | ICD-10-CM

## 2016-01-06 DIAGNOSIS — J029 Acute pharyngitis, unspecified: Secondary | ICD-10-CM

## 2016-01-06 DIAGNOSIS — R05 Cough: Secondary | ICD-10-CM | POA: Diagnosis not present

## 2016-01-06 DIAGNOSIS — I482 Chronic atrial fibrillation: Secondary | ICD-10-CM | POA: Diagnosis not present

## 2016-01-06 DIAGNOSIS — J209 Acute bronchitis, unspecified: Secondary | ICD-10-CM

## 2016-01-06 LAB — RAPID STREP SCREEN (MED CTR MEBANE ONLY): Streptococcus, Group A Screen (Direct): NEGATIVE

## 2016-01-06 MED ORDER — IPRATROPIUM-ALBUTEROL 0.5-2.5 (3) MG/3ML IN SOLN
3.0000 mL | Freq: Once | RESPIRATORY_TRACT | Status: AC
  Administered 2016-01-06: 3 mL via RESPIRATORY_TRACT

## 2016-01-06 MED ORDER — BENZONATATE 200 MG PO CAPS: 200.0000 mg | ORAL_CAPSULE | Freq: Three times a day (TID) | ORAL | Status: DC | PRN

## 2016-01-06 MED ORDER — CEFUROXIME AXETIL 500 MG PO TABS: 500.0000 mg | ORAL_TABLET | Freq: Two times a day (BID) | ORAL | Status: DC

## 2016-01-06 MED ORDER — ALBUTEROL SULFATE HFA 108 (90 BASE) MCG/ACT IN AERS: 2.0000 | INHALATION_SPRAY | RESPIRATORY_TRACT | Status: DC | PRN

## 2016-01-06 NOTE — ED Provider Notes (Signed)
CSN: [id number]     Arrival date & time 01/06/16  0844 History   First MD Initiated Contact with Patient 01/06/16 0915    Nurses notes were reviewed. Chief Complaint  Patient presents with  . Cough  . Fever  . Sore Throat  . Generalized Body Aches  . Weakness    Patient is brought here by her daughter because of congestion and coughing and some shortness of breath. She states that she started complaining of a sore throat on Monday. Monday they were outside and she was washing obtained down over our the daughter thought that may just been allergies to the pollen. However distally started developing cough and this morning noticed that she was markedly congested and one the patient didn't acknowledge any wheezing the daughter states she heard wheezing in her lungs this morning and she brought her here to be seen and evaluated. No fever. Patient reports occasional tinge chest pain but she trips from coming the defibrillator and pacemaker area but she states she has a regular basis and discuss that with her cardiologist. She's had the pacemaker and defibrillator evaluated by cardiologist in the last 6 months and things are functioning well. According to the daughter returned off the pacemaker part of the machine not abrupt battery since she stays in chronic A. Fib.  Past history is quite extensive she has cardiomyopathy coronary artery disease A. fib and CHF as well as discharge joint disease. She's had a's office in the well no significant family medical history pertinent today's visit at this time. She never smoked and she is allergic to lisinopril, zolpidem fluoxetine and clonazepam.    (Consider location/radiation/quality/duration/timing/severity/associated sxs/prior Treatment) Patient is a 82 y.o. female presenting with cough, fever, pharyngitis, and weakness. The history is provided by the patient and a relative. No language interpreter was used.  Cough Cough characteristics:   Non-productive Severity:  Moderate Onset quality:  Sudden Duration:  2 days Timing:  Constant Chronicity:  New Smoker: no   Context: upper respiratory infection   Relieved by:  Nothing Worsened by:  Activity Associated symptoms: chest pain, fever, shortness of breath and wheezing   Fever Associated symptoms: chest pain and cough   Sore Throat Associated symptoms include chest pain and shortness of breath.  Weakness Associated symptoms include chest pain and shortness of breath.    Past Medical History  Diagnosis Date  . Nonischemic cardiomyopathy ([place])   . Coronary artery disease   . Chronic atrial fibrillation ([place])   . CHF (congestive heart failure) (HCC)     class 2  . Degenerative joint disease     severe  . Dyslipidemia   . 6949-lead 11/11/2013  . ICD-Medtronic 05/10/2009    Qualifier: Diagnosis of  By: Lovena Le, MD, [facility], Binnie Kand    Past Surgical History  Procedure Laterality Date  . Medtronic dual-chamber icd  01/05/2006    generator change April 2012  . Pci stent    . Replacement total knee     Family History  Problem Relation Age of Onset  . Heart attack Father   . Cancer Mother     colon  . Hypertension Sister   . Aneurysm Sister   . Aneurysm Maternal Grandmother   . Cancer Sister     rectal   Social History  Substance Use Topics  . Smoking status: Never Smoker   . Smokeless tobacco: Never Used  . Alcohol Use: No   OB History    No data available  Review of Systems  Constitutional: Positive for fever.  Respiratory: Positive for cough, shortness of breath and wheezing.   Cardiovascular: Positive for chest pain.  Neurological: Positive for weakness.    Allergies  Clonazepam; Fluoxetine hcl; Lisinopril; and Zolpidem tartrate  Home Medications   Prior to Admission medications   Medication Sig Start Date End Date Taking? Authorizing Provider  amLODipine (NORVASC) 5 MG tablet TAKE 1 TABLET (5 MG TOTAL) BY MOUTH DAILY. 12/14/15  Yes  Deboraha Sprang, MD  furosemide (LASIX) 40 MG tablet Take 40 mg by mouth 1 dose over 46 hours.     Yes Historical Provider, MD  ibuprofen (ADVIL,MOTRIN) 800 MG tablet Take 800 mg by mouth every 8 (eight) hours as needed.   Yes Historical Provider, MD  losartan (COZAAR) 50 MG tablet TAKE 1 TABLET BY MOUTH EVERY DAY 09/13/15  Yes Deboraha Sprang, MD  metoprolol succinate (TOPROL-XL) 50 MG 24 hr tablet Take 1 tablet (50 mg total) by mouth daily. Take with or immediately following a meal. 11/09/15  Yes Deboraha Sprang, MD  Probiotic Product (ALIGN) 4 MG CAPS Take by mouth daily.   Yes Historical Provider, MD  rosuvastatin (CRESTOR) 10 MG tablet Take 1 tablet (10 mg total) by mouth daily. 05/24/15  Yes Kathrine Haddock, NP  spironolactone (ALDACTONE) 25 MG tablet Take 25 mg by mouth daily.     Yes Historical Provider, MD  warfarin (COUMADIN) 5 MG tablet Take 5 mg by mouth See admin instructions. 06/20/15  Yes Historical Provider, MD  albuterol (PROVENTIL HFA;VENTOLIN HFA) 108 (90 Base) MCG/ACT inhaler Inhale 2 puffs into the lungs every 4 (four) hours as needed for wheezing or shortness of breath. 01/06/16   Frederich Cha, MD  benzonatate (TESSALON) 200 MG capsule Take 1 capsule (200 mg total) by mouth 3 (three) times daily as needed for cough. 01/06/16   Frederich Cha, MD  cefUROXime (CEFTIN) 500 MG tablet Take 1 tablet (500 mg total) by mouth 2 (two) times daily. 01/06/16   Frederich Cha, MD  Naproxen Sodium (ALEVE) 220 MG CAPS Take by mouth at bedtime.    Historical Provider, MD   Meds Ordered and Administered this Visit   Medications  ipratropium-albuterol (DUONEB) 0.5-2.5 (3) MG/3ML nebulizer solution 3 mL (3 mLs Nebulization Given 01/06/16 1008)    BP 152/55 mmHg  Pulse 84  Temp(Src) 99.1 F (37.3 C)  Resp 18  Ht 5\' 2"  (1.575 m)  Wt 176 lb (79.833 kg)  BMI 32.18 kg/m2  SpO2 97%  LMP  (LMP Unknown) No data found.   Physical Exam  Constitutional: She is oriented to person, place, and time. She appears  well-developed and well-nourished.  HENT:  Head: Normocephalic and atraumatic.  Eyes: Conjunctivae are normal. Pupils are equal, round, and reactive to light.  Neck: Normal range of motion. Neck supple.  Cardiovascular: Regular rhythm and normal heart sounds.   Pulmonary/Chest: She has wheezes.  Musculoskeletal: Normal range of motion.  Patient uses a wheelchair relation  Neurological: She is oriented to person, place, and time.  Skin: Skin is warm.  Psychiatric: She has a normal mood and affect.  Vitals reviewed.   ED Course  Procedures (including critical care time)  Labs Review Labs Reviewed  RAPID STREP SCREEN (NOT AT [facility])  CULTURE, GROUP A STREP [facility])    Imaging Review Dg Chest 2 View  01/06/2016  CLINICAL DATA:  Productive cough for 1 week, fatigue, shortness of breath with exertion EXAM: CHEST  2 VIEW COMPARISON:  Portable chest x-ray of 03/28/2006 FINDINGS: No active infiltrate or effusion is seen. The heart is mildly enlarged and stable. AICD leads remain. There are degenerative changes throughout the thoracic spine diffusely. IMPRESSION: Stable mild cardiomegaly with AICD leads. No definite active process. Electronically Signed   By: Ivar Drape M.D.   On: 01/06/2016 10:02     Visual Acuity Review  Right Eye Distance:   Left Eye Distance:   Bilateral Distance:    Right Eye Near:   Left Eye Near:    Bilateral Near:      Results for orders placed or performed during the hospital encounter of 01/06/16  Rapid strep screen  Result Value Ref Range   Streptococcus, Group A Screen (Direct) NEGATIVE NEGATIVE     MDM   1. Acute pharyngitis, unspecified etiology   2. Acute bronchitis with bronchospasm   3. SOB (shortness of breath)    Chest x-ray was negative for acute changes. Patient is coughing will place and Tessalon Perles 200 mg 2 times a day and Ceftin 500 mg 1 tablet twice a day for infection and use albuterol inhaler. Patient has a history of A. fib but  because particular rate stable nothing else be done as far as that is concerned. Should be noted that patient does have pulse ox of 97 on presentation is still at 97 now despite DuoNeb treatment.. Will start patient if the shortness of breath gets worse she'll need to go and be seen and evaluated in the ED of her choice. Otherwise follow-up PCP provider in 3-7 days as needed. At this point time patient stable she appears to be in no distress and wheezing his diminished since this morning in the husband about the same in the office.      Note: This dictation was prepared with Dragon dictation along with smaller phrase technology. Any transcriptional errors that result from this process are unintentional.    Frederich Cha, MD 01/06/16 1036

## 2016-01-06 NOTE — Discharge Instructions (Signed)
Acute Bronchitis Bronchitis is when the airways that extend from the windpipe into the lungs get red, puffy, and painful (inflamed). Bronchitis often causes thick spit (mucus) to develop. This leads to a cough. A cough is the most common symptom of bronchitis. In acute bronchitis, the condition usually begins suddenly and goes away over time (usually in 2 weeks). Smoking, allergies, and asthma can make bronchitis worse. Repeated episodes of bronchitis may cause more lung problems. HOME CARE  Rest.  Drink enough fluids to keep your pee (urine) clear or pale yellow (unless you need to limit fluids as told by your doctor).  Only take over-the-counter or prescription medicines as told by your doctor.  Avoid smoking and secondhand smoke. These can make bronchitis worse. If you are a smoker, think about using nicotine gum or skin patches. Quitting smoking will help your lungs heal faster.  Reduce the chance of getting bronchitis again by:  Washing your hands often.  Avoiding people with cold symptoms.  Trying not to touch your hands to your mouth, nose, or eyes.  Follow up with your doctor as told. GET HELP IF: Your symptoms do not improve after 1 week of treatment. Symptoms include:  Cough.  Fever.  Coughing up thick spit.  Body aches.  Chest congestion.  Chills.  Shortness of breath.  Sore throat. GET HELP RIGHT AWAY IF:   You have an increased fever.  You have chills.  You have severe shortness of breath.  You have bloody thick spit (sputum).  You throw up (vomit) often.  You lose too much body fluid (dehydration).  You have a severe headache.  You faint. MAKE SURE YOU:   Understand these instructions.  Will watch your condition.  Will get help right away if you are not doing well or get worse.   This information is not intended to replace advice given to you by your health care provider. Make sure you discuss any questions you have with your health care  provider.   Document Released: 01/31/2008 Document Revised: 04/16/2013 Document Reviewed: 02/04/2013 Elsevier Interactive Patient Education 2016 Else[employer] Inc[employer]  Upper Respiratory Infection, Adult Most upper respiratory infections (URIs) are caused by a virus. A URI affects the nose, throat, and upper air passages. The most common type of URI is often called "the common cold." HOME CARE   Take medicines only as told by your doctor.  Gargle warm saltwater or take cough drops to comfort your throat as told by your doctor.  Use a warm mist humidifier or inhale steam from a shower to increase air moisture. This may make it easier to breathe.  Drink enough fluid to keep your pee (urine) clear or pale yellow.  Eat soups and other clear broths.  Have a healthy diet.  Rest as needed.  Go back to work when your fever is gone or your doctor says it is okay.  You may need to stay home longer to avoid giving your URI to others.  You can also wear a face mask and wash your hands often to prevent spread of the virus.  Use your inhaler more if you have asthma.  Do not use any tobacco products, including cigarettes, chewing tobacco, or electronic cigarettes. If you need help quitting, ask your doctor. GET HELP IF:  You are getting worse, not better.  Your symptoms are not helped by medicine.  You have chills.  You are getting more short of breath.  You have brown or red mucus.  You have  yellow or brown discharge from your nose.  You have pain in your face, especially when you bend forward.  You have a fever.  You have puffy (swollen) neck glands.  You have pain while swallowing.  You have white areas in the back of your throat. GET HELP RIGHT AWAY IF:   You have very bad or constant:  Headache.  Ear pain.  Pain in your forehead, behind your eyes, and over your cheekbones (sinus pain).  Chest pain.  You have long-lasting (chronic) lung disease and any of the  following:  Wheezing.  Long-lasting cough.  Coughing up blood.  A change in your usual mucus.  You have a stiff neck.  You have changes in your:  Vision.  Hearing.  Thinking.  Mood. MAKE SURE YOU:   Understand these instructions.  Will watch your condition.  Will get help right away if you are not doing well or get worse.   This information is not intended to replace advice given to you by your health care provider. Make sure you discuss any questions you have with your health care provider.   Document Released: 01/31/2008 Document Revised: 12/29/2014 Document Reviewed: 11/19/2013 Elsevier Interactive Patient Education 2016 Elsevier Inc.  Sore Throat A sore throat is a painful, burning, sore, or scratchy feeling of the throat. There may be pain or tenderness when swallowing or talking. You may have other symptoms with a sore throat. These include coughing, sneezing, fever, or a swollen neck. A sore throat is often the first sign of another sickness. These sicknesses may include a cold, flu, strep throat, or an infection called mono. Most sore throats go away without medical treatment.  HOME CARE   Only take medicine as told by your doctor.  Drink enough fluids to keep your pee (urine) clear or pale yellow.  Rest as needed.  Try using throat sprays, lozenges, or suck on hard candy (if older than 4 years or as told).  Sip warm liquids, such as broth, herbal tea, or warm water with honey. Try sucking on frozen ice pops or drinking cold liquids.  Rinse the mouth (gargle) with salt water. Mix 1 teaspoon salt with 8 ounces of water.  Do not smoke. Avoid being around others when they are smoking.  Put a humidifier in your bedroom at night to moisten the air. You can also turn on a hot shower and sit in the bathroom for 5-10 minutes. Be sure the bathroom door is closed. GET HELP RIGHT AWAY IF:   You have trouble breathing.  You cannot swallow fluids, soft foods, or  your spit (saliva).  You have more puffiness (swelling) in the throat.  Your sore throat does not get better in 7 days.  You feel sick to your stomach (nauseous) and throw up (vomit).  You have a fever or lasting symptoms for more than 2-3 days.  You have a fever and your symptoms suddenly get worse. MAKE SURE YOU:   Understand these instructions.  Will watch your condition.  Will get help right away if you are not doing well or get worse.   This information is not intended to replace advice given to you by your health care provider. Make sure you discuss any questions you have with your health care provider.   Document Released: 05/23/2008 Document Revised: 05/08/2012 Document Reviewed: 04/21/2012 Elsevier Interactive Patient Education [employer].

## 2016-01-06 NOTE — ED Notes (Signed)
Productive cough, fever, sore throat, body aches, weakness, onset yesterday.

## 2016-01-07 ENCOUNTER — Encounter: Payer: Self-pay | Admitting: Unknown Physician Specialty

## 2016-01-07 ENCOUNTER — Emergency Department
Admission: EM | Admit: 2016-01-07 | Discharge: 2016-01-07 | Disposition: A | Discharge status: Home / Self Care | Payer: Medicare Other | Attending: Emergency Medicine | Admitting: Emergency Medicine

## 2016-01-07 ENCOUNTER — Emergency Department: Payer: Medicare Other

## 2016-01-07 ENCOUNTER — Encounter: Payer: Self-pay | Admitting: Emergency Medicine

## 2016-01-07 ENCOUNTER — Ambulatory Visit (INDEPENDENT_AMBULATORY_CARE_PROVIDER_SITE_OTHER): Payer: Medicare Other | Admitting: Unknown Physician Specialty

## 2016-01-07 VITALS — BP 128/75 | HR 78 | Temp 98.2°F | Ht 61.0 in | Wt 181.8 lb

## 2016-01-07 DIAGNOSIS — Z7901 Long term (current) use of anticoagulants: Secondary | ICD-10-CM | POA: Insufficient documentation

## 2016-01-07 DIAGNOSIS — I2510 Atherosclerotic heart disease of native coronary artery without angina pectoris: Secondary | ICD-10-CM | POA: Insufficient documentation

## 2016-01-07 DIAGNOSIS — I4891 Unspecified atrial fibrillation: Secondary | ICD-10-CM

## 2016-01-07 DIAGNOSIS — I482 Chronic atrial fibrillation: Secondary | ICD-10-CM | POA: Insufficient documentation

## 2016-01-07 DIAGNOSIS — I255 Ischemic cardiomyopathy: Secondary | ICD-10-CM | POA: Diagnosis not present

## 2016-01-07 DIAGNOSIS — I5022 Chronic systolic (congestive) heart failure: Secondary | ICD-10-CM | POA: Diagnosis not present

## 2016-01-07 DIAGNOSIS — M1990 Unspecified osteoarthritis, unspecified site: Secondary | ICD-10-CM | POA: Insufficient documentation

## 2016-01-07 DIAGNOSIS — R0602 Shortness of breath: Secondary | ICD-10-CM | POA: Diagnosis not present

## 2016-01-07 DIAGNOSIS — Z79899 Other long term (current) drug therapy: Secondary | ICD-10-CM | POA: Diagnosis not present

## 2016-01-07 DIAGNOSIS — R05 Cough: Secondary | ICD-10-CM | POA: Diagnosis not present

## 2016-01-07 DIAGNOSIS — I110 Hypertensive heart disease with heart failure: Secondary | ICD-10-CM | POA: Insufficient documentation

## 2016-01-07 DIAGNOSIS — Z7952 Long term (current) use of systemic steroids: Secondary | ICD-10-CM | POA: Insufficient documentation

## 2016-01-07 LAB — PROTIME-INR
INR: 1.98
Prothrombin Time: 22.4 seconds — ABNORMAL HIGH (ref 11.4–15.0)

## 2016-01-07 LAB — CBC
HCT: 40.5 % (ref 35.0–47.0)
Hemoglobin: 14.3 g/dL (ref 12.0–16.0)
MCH: 30.5 pg (ref 26.0–34.0)
MCHC: 35.2 g/dL (ref 32.0–36.0)
MCV: 86.7 fL (ref 80.0–100.0)
PLATELETS: 142 10*3/uL — AB (ref 150–440)
RBC: 4.67 MIL/uL (ref 3.80–5.20)
RDW: 14.9 % — AB (ref 11.5–14.5)
WBC: 6 10*3/uL (ref 3.6–11.0)

## 2016-01-07 LAB — BASIC METABOLIC PANEL
Anion gap: 10 (ref 5–15)
BUN: 22 mg/dL — AB (ref 6–20)
CALCIUM: 9 mg/dL (ref 8.9–10.3)
CHLORIDE: 104 mmol/L (ref 101–111)
CO2: 27 mmol/L (ref 22–32)
CREATININE: 0.79 mg/dL (ref 0.44–1.00)
GFR calc non Af Amer: >60 mL/min (ref >60)
Glucose, Bld: 118 mg/dL — ABNORMAL HIGH (ref 65–99)
Potassium: 2.9 mmol/L — CL (ref 3.5–5.1)
SODIUM: 141 mmol/L (ref 135–145)

## 2016-01-07 LAB — TROPONIN I: TROPONIN I: 0.03 ng/mL (ref <0.031)

## 2016-01-07 LAB — BRAIN NATRIURETIC PEPTIDE: B NATRIURETIC PEPTIDE 5: 218 pg/mL — AB (ref 0.0–100.0)

## 2016-01-07 MED ORDER — SPACER/AERO CHAMBER MOUTHPIECE MISC: Status: DC

## 2016-01-07 MED ORDER — POTASSIUM CHLORIDE CRYS ER 20 MEQ PO TBCR
40.0000 meq | EXTENDED_RELEASE_TABLET | Freq: Once | ORAL | Status: AC
  Administered 2016-01-07: 40 meq via ORAL
  Filled 2016-01-07: qty 2

## 2016-01-07 MED ORDER — POTASSIUM CHLORIDE ER 10 MEQ PO TBCR: 10.0000 meq | EXTENDED_RELEASE_TABLET | Freq: Every day | ORAL | Status: DC

## 2016-01-07 MED ORDER — OPTICHAMBER DIAMOND MISC
Status: AC
  Administered 2016-01-07: 1
  Filled 2016-01-07: qty 1

## 2016-01-07 MED ORDER — PREDNISONE 20 MG PO TABS: 40.0000 mg | ORAL_TABLET | Freq: Every day | ORAL | Status: DC

## 2016-01-07 MED ORDER — OPTICHAMBER DIAMOND MISC
1.0000 | Freq: Once | Status: AC
  Administered 2016-01-07: 1

## 2016-01-07 NOTE — ED Provider Notes (Signed)
[facility] Emergency Department Provider Note    ____________________________________________  Time seen: ~1825  I have reviewed the triage vital signs and the nursing notes.   HISTORY  Chief Complaint Shortness of Breath   History limited by: Not Limited   HPI Barbara Blackburn is a 82 y.o. female who presents to the emergency department today from primary care doctor's office because of concerns for shortness of breath. Patient states that the shortness of breath has gotten worse over the course of the past 4 days. She does describe requiring more pillows than normal at night. Additionally she has had some increased swelling in her legs. Patient states she does have a history of heart failure. She has had a cough. This is been productive of clear phlegm. She is nondistended blood in them. She denies any associated chest pain. She has not had any fevers. She saw urgent care yesterday and had a negative x-ray. They put her on antibiotics and breathing treatments.   Past Medical History  Diagnosis Date  . Nonischemic cardiomyopathy ([place])   . Coronary artery disease   . Chronic atrial fibrillation ([place])   . CHF (congestive heart failure) ([facility])     class 2  . Degenerative joint disease     severe  . Dyslipidemia   . 6949-lead 11/11/2013  . ICD-Medtronic 05/10/2009    Qualifier: Diagnosis of  By: Lovena Le, MD, Martyn Malay     Patient Active Problem List   Diagnosis Date Noted  . SOB (shortness of breath) 01/07/2016  . Essential hypertension, benign 02/15/2015  . Hypercholesterolemia 02/15/2015  . Arthritis, degenerative 05/06/2014  . 6949-lead 11/11/2013  . CARDIOMYOPATHY, ISCHEMIC 05/10/2009  . ATRIAL FIBRILLATION 05/10/2009  . Chronic systolic heart failure ([place]) 05/10/2009  . ICD-Medtronic 05/10/2009  . Automatic implantable cardioverter-defibrillator in situ 05/10/2009    Past Surgical History  Procedure Laterality Date  . Medtronic  dual-chamber icd  01/05/2006    generator change April 2012  . Pci stent    . Replacement total knee      Current Outpatient Rx  Name  Route  Sig  Dispense  Refill  . albuterol (PROVENTIL HFA;VENTOLIN HFA) 108 (90 Base) MCG/ACT inhaler   Inhalation   Inhale 2 puffs into the lungs every 4 (four) hours as needed for wheezing or shortness of breath.   1 Inhaler   0   . amLODipine (NORVASC) 5 MG tablet      TAKE 1 TABLET (5 MG TOTAL) BY MOUTH DAILY.   30 tablet   3   . benzonatate (TESSALON) 200 MG capsule   Oral   Take 1 capsule (200 mg total) by mouth 3 (three) times daily as needed for cough.   20 capsule   0   . cefUROXime (CEFTIN) 500 MG tablet   Oral   Take 1 tablet (500 mg total) by mouth 2 (two) times daily.   20 tablet   0   . furosemide (LASIX) 40 MG tablet   Oral   Take 40 mg by mouth 1 dose over 46 hours.           [name] ibuprofen (ADVIL,MOTRIN) 800 MG tablet   Oral   Take 800 mg by mouth every 8 (eight) hours as needed.         [name] losartan (COZAAR) 50 MG tablet      TAKE 1 TABLET BY MOUTH EVERY DAY   30 tablet   3   . metoprolol succinate (TOPROL-XL)  50 MG 24 hr tablet   Oral   Take 1 tablet (50 mg total) by mouth daily. Take with or immediately following a meal.   30 tablet   11   . Naproxen Sodium (ALEVE) 220 MG CAPS   Oral   Take by mouth at bedtime.         . predniSONE (DELTASONE) 20 MG tablet   Oral   Take 2 tablets (40 mg total) by mouth daily with breakfast.   10 tablet   0   . Probiotic Product (ALIGN) 4 MG CAPS   Oral   Take by mouth daily.         . rosuvastatin (CRESTOR) 10 MG tablet   Oral   Take 1 tablet (10 mg total) by mouth daily.   30 tablet   3   . Spacer/Aero Chamber [employer]      Use on inhaler.  OK to use available brand   1 each   0   . spironolactone (ALDACTONE) 25 MG tablet   Oral   Take 25 mg by mouth daily.           [name] warfarin (COUMADIN) 5 MG tablet   Oral   Take 5 mg by mouth See admin  instructions.      6     Allergies Clonazepam; Fluoxetine hcl; Lisinopril; and Zolpidem tartrate  Family History  Problem Relation Age of Onset  . Heart attack Father   . Cancer Mother     colon  . Hypertension Sister   . Aneurysm Sister   . Aneurysm Maternal Grandmother   . Cancer Sister     rectal    Social History Social History  Substance Use Topics  . Smoking status: Never Smoker   . Smokeless tobacco: Never Used  . Alcohol Use: No    Review of Systems  Constitutional: Negative for fever. Cardiovascular: Negative for chest pain. Respiratory: Positive for shortness of breath. Gastrointestinal: Negative for abdominal pain, vomiting and diarrhea. Neurological: Negative for headaches, focal weakness or numbness.  10-point ROS otherwise negative.  ____________________________________________   PHYSICAL EXAM:  VITAL SIGNS: ED Triage Vitals  Enc Vitals Group     BP 01/07/16 1729 119/84 mmHg     Pulse Rate 01/07/16 1729 74     Resp 01/07/16 1729 20     Temp 01/07/16 1729 98.3 F (36.8 C)     Temp Source 01/07/16 1729 Oral     SpO2 01/07/16 1729 95 %     Weight 01/07/16 1729 181 lb (82.101 kg)     Height 01/07/16 1729 5\' 1"  (1.549 m)   Constitutional: Alert and oriented. Well appearing and in no distress. Eyes: Conjunctivae are normal. PERRL. Normal extraocular movements. ENT   Head: Normocephalic and atraumatic.   Nose: No congestion/rhinnorhea.   Mouth/Throat: Mucous membranes are moist.   Neck: No stridor. Hematological/Lymphatic/Immunilogical: No cervical lymphadenopathy. Cardiovascular: Irregularly regular rhythm.  No murmurs, rubs, or gallops. Respiratory: Slightly increased work of breathing. Prolonged expiratory phase. Diffuse bilateral wheezing. No crackles appreciated. Gastrointestinal: Soft and nontender. No distention. Genitourinary: Deferred Musculoskeletal: Normal range of motion in all extremities. No joint effusions.  1+  pitting edema bilateral lower extremities. Neurologic:  Normal speech and language. No gross focal neurologic deficits are appreciated.  Skin:  Skin is warm, dry and intact. No rash noted. Psychiatric: Mood and affect are normal. Speech and behavior are normal. Patient exhibits appropriate insight and judgment.  ____________________________________________    LABS (pertinent  positives/negatives)  Labs Reviewed  BASIC METABOLIC PANEL - Abnormal; Notable for the following:    Potassium 2.9 (*)    Glucose, Bld 118 (*)    BUN 22 (*)    All other components within normal limits  CBC - Abnormal; Notable for the following:    RDW 14.9 (*)    Platelets 142 (*)    All other components within normal limits  PROTIME-INR - Abnormal; Notable for the following:    Prothrombin Time 22.4 (*)    All other components within normal limits  BRAIN NATRIURETIC PEPTIDE - Abnormal; Notable for the following:    B Natriuretic Peptide 218.0 (*)    All other components within normal limits  TROPONIN I    ____________________________________________   EKG  I, Nance Pear, attending physician, personally viewed and interpreted this EKG  EKG Time: 1728 Rate: 78 Rhythm: atrial fibrillation with occasional PVC and paced complex Axis: normal Intervals: qtc 449 QRS: q waves V1, V2, V3 ST changes: no st elevation Impression: abnormal ekg  ____________________________________________    RADIOLOGY  CXR IMPRESSION: Stable cardiomegaly and mild right lung scarring. No acute findings.  ____________________________________________   PROCEDURES  Procedure(s) performed: None  Critical Care performed: No  ____________________________________________   INITIAL IMPRESSION / ASSESSMENT AND PLAN / ED COURSE  Pertinent labs & imaging results that were available during my care of the patient were reviewed by me and considered in my medical decision making (see chart for details).  Patient  presented to the emergency department today from a primary care doctor's office because of concerns for shortness breath. Does appear that this is been getting worse over the past 4-5 days. On exam here patient does have a prolonged expiratory phase. There is some swelling and orthopnea reported. Will add on a BNP. Chest x-ray today does not show any concerning findings. EKG without concerning findings.  ----------------------------------------- 8:06 PM on 01/07/2016 -----------------------------------------  Potassium was low and patient was given potassium here. BNP was mildly elevated. No edema seen on the chest x-ray. The patient was stood up and ambulated. The patient maintained his sats in the mid to high 90s during ambulation. Patient states she would like to go home. At this point I think this is reasonable given lack of overly concerning findings. I discussed that they could increase the Lasix dose for the next couple of days. I did however discuss the fact that this might drive the potassium lower. Will give the patient prescription for potassium supplements. Additionally will give patient information on potassium-rich foods. Instructed patient follow-up with primary care early next week. ____________________________________________   FINAL CLINICAL IMPRESSION(S) / ED DIAGNOSES  Final diagnoses:  Shortness of breath     Nance Pear, MD 01/07/16 2007

## 2016-01-07 NOTE — Assessment & Plan Note (Addendum)
Well controlled but needs to r/o acute on chronic systolic heart failure

## 2016-01-07 NOTE — Discharge Instructions (Signed)
Please seek medical attention for any high fevers, chest pain, shortness of breath, change in behavior, persistent vomiting, bloody stool or any other new or concerning symptoms.   Shortness of Breath Shortness of breath means you have trouble breathing. Shortness of breath needs medical care right away. HOME CARE   Do not smoke.  Avoid being around chemicals or things (paint fumes, dust) that may bother your breathing.  Rest as needed. Slowly begin your normal activities.  Only take medicines as told by your doctor.  Keep all doctor visits as told. GET HELP RIGHT AWAY IF:   Your shortness of breath gets worse.  You feel lightheaded, pass out (faint), or have a cough that is not helped by medicine.  You cough up blood.  You have pain with breathing.  You have pain in your chest, arms, shoulders, or belly (abdomen).  You have a fever.  You cannot walk up stairs or exercise the way you normally do.  You do not get better in the time expected.  You have a hard time doing normal activities even with rest.  You have problems with your medicines.  You have any new symptoms. MAKE SURE YOU:  Understand these instructions.  Will watch your condition.  Will get help right away if you are not doing well or get worse.   This information is not intended to replace advice given to you by your health care provider. Make sure you discuss any questions you have with your health care provider.   Document Released: 01/31/2008 Document Revised: 08/19/2013 Document Reviewed: 10/30/2011 Elsevier Interactive Patient Education 2016 [place]. Potassium Content of Foods Potassium is a mineral found in many foods and drinks. It helps keep fluids and minerals balanced in your body and affects how steadily your heart beats. Potassium also helps control your blood pressure and keep your muscles and nervous system healthy. Certain health conditions and medicines may change the balance of  potassium in your body. When this happens, you can help balance your level of potassium through the foods that you do or do not eat. Your health care provider or dietitian may recommend an amount of potassium that you should have each day. The following lists of foods provide the amount of potassium (in parentheses) per serving in each item. HIGH IN POTASSIUM  The following foods and beverages have 200 mg or more of potassium per serving:  Apricots, 2 raw or 5 dry (200 mg).  Artichoke, 1 medium (345 mg).  Avocado, raw,  each (245 mg).  Banana, 1 medium (425 mg).  Beans, lima, or baked beans, canned,  cup (280 mg).  Beans, white, canned,  cup (595 mg).  Beef roast, 3 oz (320 mg).  Beef, ground, 3 oz (270 mg).  Beets, raw or cooked,  cup (260 mg).  Bran muffin, 2 oz (300 mg).  Broccoli,  cup (230 mg).  Brussels sprouts,  cup (250 mg).  Cantaloupe,  cup (215 mg).  Cereal, 100% bran,  cup (200-400 mg).  Cheeseburger, single, fast food, 1 each (225-400 mg).  Chicken, 3 oz (220 mg).  Clams, canned, 3 oz (535 mg).  Crab, 3 oz (225 mg).  Dates, 5 each (270 mg).  Dried beans and peas,  cup (300-475 mg).  Figs, dried, 2 each (260 mg).  Fish: halibut, tuna, cod, snapper, 3 oz (480 mg).  Fish: salmon, haddock, swordfish, perch, 3 oz (300 mg).  Fish, tuna, canned 3 oz (200 mg).  [place] fries, fast food, 3  oz (470 mg).  Granola with fruit and nuts,  cup (200 mg).  Grapefruit juice,  cup (200 mg).  Greens, beet,  cup (655 mg).  Honeydew melon,  cup (200 mg).  Kale, raw, 1 cup (300 mg).  Kiwi, 1 medium (240 mg).  Kohlrabi, rutabaga, parsnips,  cup (280 mg).  Lentils,  cup (365 mg).  Mango, 1 each (325 mg).  Milk, chocolate, 1 cup (420 mg).  Milk: nonfat, low-fat, whole, buttermilk, 1 cup (350-380 mg).  Molasses, 1 Tbsp (295 mg).  Mushrooms,  cup (280) mg.  Nectarine, 1 each (275 mg).  Nuts: almonds, peanuts, hazelnuts, [place], cashew,  mixed, 1 oz (200 mg).  Nuts, pistachios, 1 oz (295 mg).  Orange, 1 each (240 mg).  Orange juice,  cup (235 mg).  Papaya, medium,  fruit (390 mg).  Peanut butter, chunky, 2 Tbsp (240 mg).  Peanut butter, smooth, 2 Tbsp (210 mg).  Pear, 1 medium (200 mg).  Pomegranate, 1 whole (400 mg).  Pomegranate juice,  cup (215 mg).  Pork, 3 oz (350 mg).  Potato chips, salted, 1 oz (465 mg).  Potato, baked with skin, 1 medium (925 mg).  Potatoes, boiled,  cup (255 mg).  Potatoes, mashed,  cup (330 mg).  Prune juice,  cup (370 mg).  Prunes, 5 each (305 mg).  Pudding, chocolate,  cup (230 mg).  Pumpkin, canned,  cup (250 mg).  Raisins, seedless,  cup (270 mg).  Seeds, sunflower or pumpkin, 1 oz (240 mg).  Soy milk, 1 cup (300 mg).  Spinach,  cup (420 mg).  Spinach, canned,  cup (370 mg).  Sweet potato, baked with skin, 1 medium (450 mg).  Swiss chard,  cup (480 mg).  Tomato or vegetable juice,  cup (275 mg).  Tomato sauce or puree,  cup (400-550 mg).  Tomato, raw, 1 medium (290 mg).  Tomatoes, canned,  cup (200-300 mg).  [place], 3 oz (250 mg).  Wheat germ, 1 oz (250 mg).  Winter squash,  cup (250 mg).  Yogurt, plain or fruited, 6 oz (260-435 mg).  Zucchini,  cup (220 mg). MODERATE IN POTASSIUM The following foods and beverages have 50-200 mg of potassium per serving:  Apple, 1 each (150 mg).  Apple juice,  cup (150 mg).  Applesauce,  cup (90 mg).  Apricot nectar,  cup (140 mg).  Asparagus, small spears,  cup or 6 spears (155 mg).  Bagel, cinnamon raisin, 1 each (130 mg).  Bagel, egg or plain, 4 in., 1 each (70 mg).  Beans, green,  cup (90 mg).  Beans, yellow,  cup (190 mg).  Beer, regular, 12 oz (100 mg).  Beets, canned,  cup (125 mg).  Blackberries,  cup (115 mg).  Blueberries,  cup (60 mg).  Bread, whole wheat, 1 slice (70 mg).  Broccoli, raw,  cup (145 mg).  Cabbage,  cup (150 mg).  Carrots, cooked  or raw,  cup (180 mg).  Cauliflower, raw,  cup (150 mg).  Celery, raw,  cup (155 mg).  Cereal, bran flakes, cup (120-150 mg).  Cheese, cottage,  cup (110 mg).  Cherries, 10 each (150 mg).  Chocolate, 1 oz bar (165 mg).  Coffee, brewed 6 oz (90 mg).  Corn,  cup or 1 ear (195 mg).  Cucumbers,  cup (80 mg).  Egg, large, 1 each (60 mg).  Eggplant,  cup (60 mg).  Endive, raw, cup (80 mg).  English muffin, 1 each (65 mg).  Fish, orange roughy, 3 oz (150 mg).  Frankfurter, beef or pork, 1 each (75 mg).  Fruit cocktail,  cup (115 mg).  Grape juice,  cup (170 mg).  Grapefruit,  fruit (175 mg).  Grapes,  cup (155 mg).  Greens: kale, turnip, collard,  cup (110-150 mg).  Ice cream or frozen yogurt, chocolate,  cup (175 mg).  Ice cream or frozen yogurt, vanilla,  cup (120-150 mg).  Lemons, limes, 1 each (80 mg).  Lettuce, all types, 1 cup (100 mg).  Mixed vegetables,  cup (150 mg).  Mushrooms, raw,  cup (110 mg).  Nuts: walnuts, pecans, or macadamia, 1 oz (125 mg).  Oatmeal,  cup (80 mg).  Okra,  cup (110 mg).  Onions, raw,  cup (120 mg).  Peach, 1 each (185 mg).  Peaches, canned,  cup (120 mg).  Pears, canned,  cup (120 mg).  Peas, green, frozen,  cup (90 mg).  Peppers, green,  cup (130 mg).  Peppers, red,  cup (160 mg).  Pineapple juice,  cup (165 mg).  Pineapple, fresh or canned,  cup (100 mg).  Plums, 1 each (105 mg).  Pudding, vanilla,  cup (150 mg).  Raspberries,  cup (90 mg).  Rhubarb,  cup (115 mg).  Rice, wild,  cup (80 mg).  Shrimp, 3 oz (155 mg).  Spinach, raw, 1 cup (170 mg).  Strawberries,  cup (125 mg).  Summer squash  cup (175-200 mg).  Swiss chard, raw, 1 cup (135 mg).  Tangerines, 1 each (140 mg).  Tea, brewed, 6 oz (65 mg).  Turnips,  cup (140 mg).  Watermelon,  cup (85 mg).  Wine, red, table, 5 oz (180 mg).  Wine, white, table, 5 oz (100 mg). LOW IN POTASSIUM The  following foods and beverages have less than 50 mg of potassium per serving.  Bread, white, 1 slice (30 mg).  Carbonated beverages, 12 oz (less than 5 mg).  Cheese, 1 oz (20-30 mg).  Cranberries,  cup (45 mg).  Cranberry juice cocktail,  cup (20 mg).  Fats and oils, 1 Tbsp (less than 5 mg).  Hummus, 1 Tbsp (32 mg).  Nectar: papaya, mango, or pear,  cup (35 mg).  Rice, white or brown,  cup (50 mg).  Spaghetti or macaroni,  cup cooked (30 mg).  Tortilla, flour or corn, 1 each (50 mg).  Waffle, 4 in., 1 each (50 mg).  Water chestnuts,  cup (40 mg).   This information is not intended to replace advice given to you by your health care provider. Make sure you discuss any questions you have with your health care provider.   Document Released: 03/28/2005 Document Revised: 08/19/2013 Document Reviewed: 07/11/2013 Elsevier Interactive Patient Education [employer].

## 2016-01-07 NOTE — ED Notes (Signed)
Patient seen at [facility] yesterday and diagnosed with Bronchitis, per patient daughter today her Shortness of breath got worse today and when she tried to take her cough medication she started coughing so had she vomited.

## 2016-01-07 NOTE — ED Notes (Signed)
RN had to break potassium tablets in half and give to patient in apple sauce. Patient was able to get down 3 of the tablets but was unable to get down the last piece of tablet.

## 2016-01-07 NOTE — ED Notes (Signed)
Pt provided education on how to use MDI spacer with her albuterol inhaler.

## 2016-01-07 NOTE — ED Notes (Signed)
Report given to David, RN.

## 2016-01-07 NOTE — Assessment & Plan Note (Signed)
Pt with significant SOB with ambulation and deoxygenation to mid 80's. Unable to walk a couple of steps without struggle.  Discussion with family.  I recommend ER evaluation.  I put in orders for BNP and CBC which I will cancel.  I will leave the orders in for spacer and Prednisone in case pt decides AMA to not be seen in the ER.

## 2016-01-07 NOTE — Progress Notes (Signed)
BP 128/75 mmHg  Pulse 78  Temp(Src) 98.2 F (36.8 C)  Ht 5\' 1"  (1.549 m)  Wt 181 lb 12.8 oz (82.464 kg)  BMI 34.37 kg/m2  SpO2 94%  LMP  (LMP Unknown)   Subjective:    Patient ID: Barbara Blackburn, female    DOB: 13-Jul-1929, 82 y.o.   MRN: [id number]  HPI: Barbara Blackburn is a 82 y.o. female  Chief Complaint  Patient presents with  . Follow-up    pt was seen at urgent care yesterday   Went to urgent care yesterday following 4 days of respiratory symptoms.  I reviewed these notes.  She was given Ceftin, had a normal chest x-ray, and given an inhaler that she is having trouble using.  She has an inhaler and unable to use it.  They called EMS today due to such a bad cough and given a nebulizer treatment which helped quite a bit and also given one at urgent care.    Her daughter is concerned about a humidifier at home full of mold.  She does have a new humidifier.    Relevant past medical, surgical, family and social history reviewed and updated as indicated. Interim medical history since our last visit reviewed. Allergies and medications reviewed and updated.  Review of Systems  Constitutional: Positive for activity change and fatigue.  HENT: Positive for congestion.   Respiratory: Positive for cough, choking, chest tightness, shortness of breath and wheezing.   Gastrointestinal: Negative.   Genitourinary: Negative.   Neurological: Positive for weakness and light-headedness.  Psychiatric/Behavioral: Negative.     Per HPI unless specifically indicated above     Objective:    BP 128/75 mmHg  Pulse 78  Temp(Src) 98.2 F (36.8 C)  Ht 5\' 1"  (1.549 m)  Wt 181 lb 12.8 oz (82.464 kg)  BMI 34.37 kg/m2  SpO2 94%  LMP  (LMP Unknown)  Wt Readings from Last 3 Encounters:  01/07/16 181 lb 12.8 oz (82.464 kg)  01/06/16 176 lb (79.833 kg)  11/09/15 177 lb (80.287 kg)    Physical Exam  Constitutional: She is oriented to person, place, and time. She appears well-developed  and well-nourished. She is cooperative. She has a sickly appearance. She appears ill. No distress.  HENT:  Head: Normocephalic and atraumatic.  Eyes: Conjunctivae and lids are normal. Right eye exhibits no discharge. Left eye exhibits no discharge. No scleral icterus.  Neck: Normal range of motion. Neck supple. No JVD present. Carotid bruit is not present.  Cardiovascular: An irregularly irregular rhythm present.  Goood rate control a-fib  Pulmonary/Chest: Effort normal. She has decreased breath sounds in the right lower field and the left lower field. She has wheezes in the right lower field and the left lower field. She has rhonchi in the right lower field and the left lower field.  Abdominal: Normal appearance. There is no splenomegaly or hepatomegaly.  Musculoskeletal: Normal range of motion.  Neurological: She is alert and oriented to person, place, and time.  Skin: Skin is warm, dry and intact. No rash noted. No pallor.  Psychiatric: She has a normal mood and affect. Her behavior is normal. Judgment and thought content normal.       Assessment & Plan:   Problem List Items Addressed This Visit      Unprioritized   ATRIAL FIBRILLATION    Well controlled but needs to r/o acute on chronic systolic heart failure      SOB (shortness of breath) - Primary  Pt with significant SOB with ambulation and deoxygenation to mid 80's. Unable to walk a couple of steps without struggle.  Discussion with family.  I recommend ER evaluation.  I put in orders for BNP and CBC which I will cancel.  I will leave the orders in for spacer and Prednisone in case pt decides AMA to not be seen in the ER.       Relevant Orders   B Nat Peptide   CBC with Differential/Platelet      Called ER and gave a report  Follow up plan: Return for In the ER.

## 2016-01-07 NOTE — ED Notes (Signed)
Barbara Balboa, MD with [facility] to give patient a space for her MDI. Patient to be instructed on use and it is to be sent home with per. Order to be entered and carried by nursing staff,

## 2016-01-07 NOTE — ED Notes (Signed)
Patient to ER for c/o shortness of breath. Was diagnosed with acute bronchitis yesterday at urgent care yesterday. Called EMS today, received breathing treatment. Saw PCP earlier today and was sent here for further eval.

## 2016-01-08 ENCOUNTER — Other Ambulatory Visit: Payer: Self-pay | Admitting: Internal Medicine

## 2016-01-08 LAB — CULTURE, GROUP A STREP (THRC)

## 2016-01-10 ENCOUNTER — Encounter: Payer: Self-pay | Admitting: Unknown Physician Specialty

## 2016-01-10 ENCOUNTER — Ambulatory Visit (INDEPENDENT_AMBULATORY_CARE_PROVIDER_SITE_OTHER): Payer: Medicare Other | Admitting: Unknown Physician Specialty

## 2016-01-10 ENCOUNTER — Telehealth: Payer: Self-pay | Admitting: Unknown Physician Specialty

## 2016-01-10 VITALS — BP 150/71 | HR 66 | Temp 97.9°F | Ht 61.0 in | Wt 176.8 lb

## 2016-01-10 DIAGNOSIS — E876 Hypokalemia: Secondary | ICD-10-CM | POA: Diagnosis not present

## 2016-01-10 DIAGNOSIS — Z66 Do not resuscitate: Secondary | ICD-10-CM

## 2016-01-10 DIAGNOSIS — R0602 Shortness of breath: Secondary | ICD-10-CM

## 2016-01-10 DIAGNOSIS — I5022 Chronic systolic (congestive) heart failure: Secondary | ICD-10-CM

## 2016-01-10 MED ORDER — ALBUTEROL SULFATE (2.5 MG/3ML) 0.083% IN NEBU: 2.5000 mg | INHALATION_SOLUTION | RESPIRATORY_TRACT | Status: DC | PRN

## 2016-01-10 MED ORDER — POTASSIUM CHLORIDE 20 MEQ PO PACK: 20.0000 meq | PACK | Freq: Every day | ORAL | Status: DC

## 2016-01-10 NOTE — Assessment & Plan Note (Addendum)
They would like a second opinion and will refer to [facility] cardiology.  Continue 40 mg of Lasix daily.

## 2016-01-10 NOTE — Assessment & Plan Note (Addendum)
Discussed with pt and family.  She is a DNR.

## 2016-01-10 NOTE — Telephone Encounter (Signed)
Patient scheduled at 11 today

## 2016-01-10 NOTE — Telephone Encounter (Signed)
Pt's daughter called stating pt needs to be seen for ER FU today.  No openings today so scheduled for tomorrow.  Are we able to work her in today anywhere?  Please call daughter.

## 2016-01-10 NOTE — Assessment & Plan Note (Addendum)
Seems to be improving.  Continue present treatment.  Continue nebulizer prn

## 2016-01-10 NOTE — Progress Notes (Signed)
BP 150/71 mmHg  Pulse 66  Temp(Src) 97.9 F (36.6 C)  Ht 5\' 1"  (1.549 m)  Wt 176 lb 12.8 oz (80.196 kg)  BMI 33.42 kg/m2  SpO2 95%  LMP  (LMP Unknown)   Subjective:    Patient ID: Barbara Blackburn, female    DOB: 1929-02-03, 82 y.o.   MRN: [id number]  HPI: Barbara Blackburn is a 82 y.o. female  Chief Complaint  Patient presents with  . ER follow up    pt was at ER for SOB on Friday  . Hemorrhoids    pt states her hemorrhoids have been bleeding a little   SOB Went to the ER Friday at my recommendation.  She was sent home without any really cange in treatment besides treating hypokalemia of 2.9  Some discrepency of Lasix which she is taking 40 mg daily but according to prescription should only be taking .5/day but last note from Dr. Josefa Half I just reviewed suggest he knows she is taking it daily.  .  Sounds like she has been on 40 mg all along.  GFR is greater than 40 mg.  She is using a nebulizer every 4 hours.    Discussed long term care planning.  She and her family wish her to be a DNR.    Relevant past medical, surgical, family and social history reviewed and updated as indicated. Interim medical history since our last visit reviewed. Allergies and medications reviewed and updated.  Review of Systems  Per HPI unless specifically indicated above     Objective:    BP 150/71 mmHg  Pulse 66  Temp(Src) 97.9 F (36.6 C)  Ht 5\' 1"  (1.549 m)  Wt 176 lb 12.8 oz (80.196 kg)  BMI 33.42 kg/m2  SpO2 95%  LMP  (LMP Unknown)  Wt Readings from Last 3 Encounters:  01/10/16 176 lb 12.8 oz (80.196 kg)  01/07/16 181 lb (82.101 kg)  01/07/16 181 lb 12.8 oz (82.464 kg)    Physical Exam  Constitutional: She is oriented to person, place, and time. She appears well-developed and well-nourished. No distress.  HENT:  Head: Normocephalic and atraumatic.  Eyes: Conjunctivae and lids are normal. Right eye exhibits no discharge. Left eye exhibits no discharge. No scleral icterus.   Neck: Normal range of motion. Neck supple. No JVD present. Carotid bruit is not present.  Cardiovascular: Normal rate, regular rhythm and normal heart sounds.   Pulmonary/Chest: Effort normal. She has decreased breath sounds in the right lower field and the left lower field. She has wheezes in the left lower field.  Abdominal: Normal appearance. There is no splenomegaly or hepatomegaly.  Musculoskeletal: Normal range of motion.  Neurological: She is alert and oriented to person, place, and time.  Skin: Skin is warm, dry and intact. No rash noted. No pallor.  Psychiatric: She has a normal mood and affect. Her behavior is normal. Judgment and thought content normal.    Results for orders placed or performed during the hospital encounter of [id number]  Basic metabolic panel  Result Value Ref Range   Sodium 141 135 - 145 mmol/L   Potassium 2.9 (LL) 3.5 - 5.1 mmol/L   Chloride 104 101 - 111 mmol/L   CO2 27 22 - 32 mmol/L   Glucose, Bld 118 (H) 65 - 99 mg/dL   BUN 22 (H) 6 - 20 mg/dL   Creatinine, Ser 0.79 0.44 - 1.00 mg/dL   Calcium 9.0 8.9 - 10.3 mg/dL   GFR calc non  Af Amer >60 >60 mL/min   GFR calc Af Amer >60 >60 mL/min   Anion gap 10 5 - 15  CBC  Result Value Ref Range   WBC 6.0 3.6 - 11.0 K/uL   RBC 4.67 3.80 - 5.20 MIL/uL   Hemoglobin 14.3 12.0 - 16.0 g/dL   HCT 40.5 35.0 - 47.0 %   MCV 86.7 80.0 - 100.0 fL   MCH 30.5 26.0 - 34.0 pg   MCHC 35.2 32.0 - 36.0 g/dL   RDW 14.9 (H) 11.5 - 14.5 %   Platelets 142 (L) 150 - 440 K/uL  Troponin I  Result Value Ref Range   Troponin I 0.03 <0.031 ng/mL  Protime-INR (order if Patient is taking Coumadin / Warfarin)  Result Value Ref Range   Prothrombin Time 22.4 (H) 11.4 - 15.0 seconds   INR 1.98   Brain natriuretic peptide  Result Value Ref Range   B Natriuretic Peptide 218.0 (H) 0.0 - 100.0 pg/mL      Assessment & Plan:   Problem List Items Addressed This Visit      Unprioritized   Chronic systolic heart failure ([place]) -  Primary    They would like a second opinion and will refer to [facility] cardiology.  Continue 40 mg of Lasix daily.        Relevant Orders   Ambulatory referral to Cardiology   DNR (do not resuscitate)    Discussed with pt and family.  She is a DNR.       SOB (shortness of breath)    Seems to be improving.  Continue present treatment.  Continue nebulizer prn       Other Visit Diagnoses    Hypokalemia        Not tolerating K Dur given at the hospital but doing OK with [facility].  Will continue this indefinately    Relevant Orders    Comprehensive metabolic panel        Follow up plan: Return in about 2 weeks (around 01/24/2016).

## 2016-01-11 ENCOUNTER — Telehealth: Payer: Self-pay

## 2016-01-11 ENCOUNTER — Ambulatory Visit: Payer: Medicare Other | Admitting: Unknown Physician Specialty

## 2016-01-11 LAB — COMPREHENSIVE METABOLIC PANEL
ALT: 19 IU/L (ref 0–32)
AST: 33 IU/L (ref 0–40)
Albumin/Globulin Ratio: 2.4 — ABNORMAL HIGH (ref 1.2–2.2)
Albumin: 4.4 g/dL (ref 3.5–4.7)
Alkaline Phosphatase: 69 IU/L (ref 39–117)
BUN/Creatinine Ratio: 30 — ABNORMAL HIGH (ref 12–28)
BUN: 21 mg/dL (ref 8–27)
Bilirubin Total: 0.8 mg/dL (ref 0.0–1.2)
CALCIUM: 9.3 mg/dL (ref 8.7–10.3)
CO2: 28 mmol/L (ref 18–29)
CREATININE: 0.69 mg/dL (ref 0.57–1.00)
Chloride: 98 mmol/L (ref 96–106)
GFR calc Af Amer: 92 mL/min/{1.73_m2} (ref >59)
GFR, EST NON AFRICAN AMERICAN: 80 mL/min/{1.73_m2} (ref >59)
GLUCOSE: 108 mg/dL — AB (ref 65–99)
Globulin, Total: 1.8 g/dL (ref 1.5–4.5)
Potassium: 3.7 mmol/L (ref 3.5–5.2)
Sodium: 142 mmol/L (ref 134–144)
Total Protein: 6.2 g/dL (ref 6.0–8.5)

## 2016-01-11 NOTE — Telephone Encounter (Signed)
I called and spoke to the referral intake lady with [facility]. She stated that the patient had to be terminally ill or have a diagnosis with a life expectancy of less than 12 months. I spoke with Malachy Mood about this and she stated that she did not believe this would be a good fit and suggested [facility]. I called the daughter and let her know all of this information and she stated that [facility] would be fine. I filled out and faxed the referral form to [facility].

## 2016-01-11 NOTE — Telephone Encounter (Signed)
Called and let patient's daughter know that Malachy Mood gave the OK for home health. I told the daughter that I was going to contact [facility] now.

## 2016-01-11 NOTE — Telephone Encounter (Signed)
-----   Message from Kathrine Haddock, NP sent at 01/11/2016 10:13 AM EDT ----- Yes, please see if home health can help her.  I'm not sure who [facility] is.  Home health agency? ----- Message -----    From: Moss Mc, CMA    Sent: 01/11/2016   9:44 AM      To: Kathrine Haddock, NP  Called and let daughter know about labs. She wanted to know if they could get somebody to come in temporarily to help the patient with medications. She stated that she mentioned [facility] to [place] at the appointment yesterday.

## 2016-01-21 ENCOUNTER — Ambulatory Visit: Payer: Medicare Other | Admitting: Cardiovascular Disease

## 2016-01-25 ENCOUNTER — Ambulatory Visit (INDEPENDENT_AMBULATORY_CARE_PROVIDER_SITE_OTHER): Payer: Medicare Other | Admitting: Unknown Physician Specialty

## 2016-01-25 ENCOUNTER — Encounter: Payer: Self-pay | Admitting: Unknown Physician Specialty

## 2016-01-25 ENCOUNTER — Telehealth: Payer: Self-pay

## 2016-01-25 VITALS — BP 122/85 | HR 86 | Temp 97.6°F | Ht 61.0 in | Wt 171.2 lb

## 2016-01-25 DIAGNOSIS — R0602 Shortness of breath: Secondary | ICD-10-CM | POA: Diagnosis not present

## 2016-01-25 DIAGNOSIS — R531 Weakness: Secondary | ICD-10-CM | POA: Diagnosis not present

## 2016-01-25 DIAGNOSIS — Z66 Do not resuscitate: Secondary | ICD-10-CM

## 2016-01-25 DIAGNOSIS — E7800 Pure hypercholesterolemia, unspecified: Secondary | ICD-10-CM | POA: Diagnosis not present

## 2016-01-25 DIAGNOSIS — E876 Hypokalemia: Secondary | ICD-10-CM | POA: Diagnosis not present

## 2016-01-25 MED ORDER — POTASSIUM BICARBONATE 25 MEQ PO TBEF: 25.0000 meq | EFFERVESCENT_TABLET | Freq: Every day | ORAL | Status: DC

## 2016-01-25 NOTE — Progress Notes (Signed)
BP 122/85 mmHg  Pulse 86  Temp(Src) 97.6 F (36.4 C)  Ht 5\' 1"  (1.549 m)  Wt 171 lb 3.2 oz (77.656 kg)  BMI 32.36 kg/m2  SpO2 98%  LMP  (LMP Unknown)   Subjective:    Patient ID: Barbara Blackburn, female    DOB: [birth year], 82 y.o.   MRN: [id number]  HPI: Barbara Blackburn is a 82 y.o. female  Chief Complaint  Patient presents with  . Follow-up    2 week f/u   SOB Pt is coughing.  This got better with decreasing nebulizer treatment.  She no longer has "a rattle."  She states she is continuing to feel fatigued.   States she is afraid she will fall.    Hypokalemia Needs to be checked today.    Hyperlipidemia Needs Crestor.  She is planning on seeing a new Cardiologist and has an appointment on 6/20.     Relevant past medical, surgical, family and social history reviewed and updated as indicated. Interim medical history since our last visit reviewed. Allergies and medications reviewed and updated.  Review of Systems  Per HPI unless specifically indicated above     Objective:    BP 122/85 mmHg  Pulse 86  Temp(Src) 97.6 F (36.4 C)  Ht 5\' 1"  (1.549 m)  Wt 171 lb 3.2 oz (77.656 kg)  BMI 32.36 kg/m2  SpO2 98%  LMP  (LMP Unknown)  Wt Readings from Last 3 Encounters:  01/25/16 171 lb 3.2 oz (77.656 kg)  01/10/16 176 lb 12.8 oz (80.196 kg)  01/07/16 181 lb (82.101 kg)    Physical Exam  Constitutional: She is oriented to person, place, and time. She appears well-developed and well-nourished. No distress.  HENT:  Head: Normocephalic and atraumatic.  Eyes: Conjunctivae and lids are normal. Right eye exhibits no discharge. Left eye exhibits no discharge. No scleral icterus.  Neck: Normal range of motion. Neck supple. No JVD present. Carotid bruit is not present.  Cardiovascular: S1 normal, S2 normal and normal heart sounds.  An irregularly irregular rhythm present.  Pulmonary/Chest: Effort normal and breath sounds normal.  Abdominal: Normal appearance. There is  no splenomegaly or hepatomegaly.  Musculoskeletal: Normal range of motion.  Neurological: She is alert and oriented to person, place, and time.  Skin: Skin is warm, dry and intact. No rash noted. No pallor.  Psychiatric: She has a normal mood and affect. Her behavior is normal. Judgment and thought content normal.    Results for orders placed or performed in visit on 01/10/16  Comprehensive metabolic panel  Result Value Ref Range   Glucose 108 (H) 65 - 99 mg/dL   BUN 21 8 - 27 mg/dL   Creatinine, Ser 0.69 0.57 - 1.00 mg/dL   GFR calc non Af Amer 80 >59 mL/min/1.73   GFR calc Af Amer 92 >59 mL/min/1.73   BUN/Creatinine Ratio 30 (H) 12 - 28   Sodium 142 134 - 144 mmol/L   Potassium 3.7 3.5 - 5.2 mmol/L   Chloride 98 96 - 106 mmol/L   CO2 28 18 - 29 mmol/L   Calcium 9.3 8.7 - 10.3 mg/dL   Total Protein 6.2 6.0 - 8.5 g/dL   Albumin 4.4 3.5 - 4.7 g/dL   Globulin, Total 1.8 1.5 - 4.5 g/dL   Albumin/Globulin Ratio 2.4 (H) 1.2 - 2.2   Bilirubin Total 0.8 0.0 - 1.2 mg/dL   Alkaline Phosphatase 69 39 - 117 IU/L   AST 33 0 - 40 IU/L  ALT 19 0 - 32 IU/L      Assessment & Plan:   Problem List Items Addressed This Visit      Unprioritized   DNR (do not resuscitate) - Primary    Information given      Relevant Orders   DNR (Do Not Resuscitate)   Hypercholesterolemia   Relevant Medications   rosuvastatin (CRESTOR) 20 MG tablet   Other Relevant Orders   Lipid Panel w/o Chol/HDL Ratio   SOB (shortness of breath)    Improved at rest      Relevant Orders   Ambulatory referral to Home Health   Weakness    Order home pt      Relevant Orders   Ambulatory referral to [facility]    Other Visit Diagnoses    Hypokalemia        Check CMP today    Relevant Medications    potassium bicarbonate (KLOR-CON/EF) 25 MEQ disintegrating tablet    Other Relevant Orders    Comprehensive metabolic panel        Follow up plan: Return in about 3 months (around 04/26/2016).

## 2016-01-25 NOTE — Assessment & Plan Note (Signed)
Information given

## 2016-01-25 NOTE — Assessment & Plan Note (Signed)
Improved at rest

## 2016-01-25 NOTE — Telephone Encounter (Signed)
I had filled out and faxed a referral form to [facility] for this patient. At her appointment today, patient's daughter stated that she had not heard anything. So I tried to call to find out about this referral but I did not get an answer and I left a voicemail for [facility] asking for her to please return my call.

## 2016-01-25 NOTE — Assessment & Plan Note (Signed)
Order home pt

## 2016-01-26 ENCOUNTER — Encounter: Payer: Self-pay | Admitting: Unknown Physician Specialty

## 2016-01-26 LAB — COMPREHENSIVE METABOLIC PANEL
ALK PHOS: 74 IU/L (ref 39–117)
ALT: 19 IU/L (ref 0–32)
AST: 23 IU/L (ref 0–40)
Albumin/Globulin Ratio: 2.1 (ref 1.2–2.2)
Albumin: 4.5 g/dL (ref 3.5–4.7)
BUN/Creatinine Ratio: 29 — ABNORMAL HIGH (ref 12–28)
BUN: 25 mg/dL (ref 8–27)
Bilirubin Total: 1.2 mg/dL (ref 0.0–1.2)
CALCIUM: 9.5 mg/dL (ref 8.7–10.3)
CO2: 20 mmol/L (ref 18–29)
CREATININE: 0.87 mg/dL (ref 0.57–1.00)
Chloride: 99 mmol/L (ref 96–106)
GFR calc Af Amer: 71 mL/min/{1.73_m2} (ref >59)
GFR, EST NON AFRICAN AMERICAN: 61 mL/min/{1.73_m2} (ref >59)
GLUCOSE: 98 mg/dL (ref 65–99)
Globulin, Total: 2.1 g/dL (ref 1.5–4.5)
Potassium: 5 mmol/L (ref 3.5–5.2)
SODIUM: 139 mmol/L (ref 134–144)
Total Protein: 6.6 g/dL (ref 6.0–8.5)

## 2016-01-26 LAB — LIPID PANEL W/O CHOL/HDL RATIO
CHOLESTEROL TOTAL: 147 mg/dL (ref 100–199)
HDL: 56 mg/dL (ref >39)
LDL CALC: 51 mg/dL (ref 0–99)
TRIGLYCERIDES: 201 mg/dL — AB (ref 0–149)
VLDL Cholesterol Cal: 40 mg/dL (ref 5–40)

## 2016-01-26 NOTE — Telephone Encounter (Signed)
Barbara Blackburn from [facility] returned my call. She stated the the nurse assigned to this patient was Sonda Rumble. She stated that [name] has been out for a week but she was going to call call her to see where she stands with this patient. She stated she would call me back and let me know. I told Barbara Blackburn that she could leave me a voicemail if I was not able to answer when she calls back.

## 2016-01-26 NOTE — Telephone Encounter (Signed)
Called and left [facility] Care with [facility] a voicemail asking for her to please return my call.

## 2016-01-27 ENCOUNTER — Other Ambulatory Visit: Payer: Self-pay | Admitting: *Deleted

## 2016-01-27 DIAGNOSIS — I5022 Chronic systolic (congestive) heart failure: Secondary | ICD-10-CM

## 2016-01-27 NOTE — Patient Outreach (Addendum)
[place]) Care Management  01/27/2016  Barbara Blackburn December 15, 1928 [id number]   Subjective: Telephone call to patient's home, spoke with patient, and HIPAA verified.   Patient states she is doing ok and is very hard of hearing.   Discussed [facility] Care Management services and patient requested RNCM to speak with her daughter Barbara Blackburn regarding services.  Patient gave [facility] verbal authorization to speak with daughter Barbara Blackburn) regarding healthcare needs as needed.  RNCM verified patient's daughters contact information.  Telephone call to patient's daughter Barbara Blackburn, spoke with [name], patient's name, date of birth, and address verified.  Discussed [facility] Care Management services and patient's daughter in agreement for patient to receive services.  Daughter states patient lives in a mother-in-law apartment attached to daughter's house.   States patient apartment is fully handicapped accessible and has a ramp to entry.   Patient has quad cane, rollator, ramp, bedside commode seat, rolling walker, grab bars in bathroom, and accessible shower.   Daughter states patient does not get in the shower because she is aware of falling.   States patient slide off the toilet approximately 3 months ago and bruised her knee.   Daughter states she is concerned patient is showing increased signs of forgetfulness, confusion, and mixing up her medications.  States patient has not been feeling well over the last couple of weeks, has increase shortness of breath, increase fatigue, decreased activity, has poor appetite,  and has lost approximately 10 pounds.   Daughter states she has started using a pill box for patient and is monitoring medication closely.  Daughter states she will assist patient as needed, wants [facility] Care Management to advise her on anything that she can assist with and  to improve patient's quality of life.  Patient's daughter in agreement to a referral to [place] for home safety evaluation, medication review, congestive heart failure disease education, congestive heart failure disease monitoring, and possible home care coordination.   Daughter given RNCM's contact number, [facility] Care Management main number, and 24 hour Nurse Advice line number for future reference.  Telephone call to primary care providers office Kathrine Haddock FNP), left HIPAA compliant voicemail message for [place] (Referral source) and requested call back for update on referral.    Objective: Per chart review: Patient has ED visit on 01/07/16 for shortness of breath.  Patient has a history of: Nonischemic cardiomyopathy, Coronary artery disease, Chronic atrial fibrillation, Essential hypertension benign, chronic systolic congestive heart failure, Dyslipidemia, Automatic implantable cardioverter-defibrillator in situand Degenerative joint disease.    Assessment: Received MD referral on 01/12/16.   Referral source: [place] at [facility] FNP's office.   Telephone screen completion with  patient and patient's daughter per patient's request.  Patient will receive [place] services.    No Telephonic RNCM services needed at this time.   Plan: RNCM will refer patient to [place] for home safety evaluation, medication review, congestive heart failure disease education, congestive heart failure disease monitoring, and possible home care coordination.      Steph Cheadle H. Annia Friendly, BSN, [place] Management [facility] Telephonic CM Phone: [phone] Fax: [phone]

## 2016-01-28 ENCOUNTER — Other Ambulatory Visit: Payer: Self-pay | Admitting: *Deleted

## 2016-01-28 DIAGNOSIS — M6281 Muscle weakness (generalized): Secondary | ICD-10-CM | POA: Diagnosis not present

## 2016-01-28 DIAGNOSIS — E7800 Pure hypercholesterolemia, unspecified: Secondary | ICD-10-CM | POA: Diagnosis not present

## 2016-01-28 DIAGNOSIS — Z7901 Long term (current) use of anticoagulants: Secondary | ICD-10-CM | POA: Diagnosis not present

## 2016-01-28 DIAGNOSIS — G8929 Other chronic pain: Secondary | ICD-10-CM | POA: Diagnosis not present

## 2016-01-28 DIAGNOSIS — M25552 Pain in left hip: Secondary | ICD-10-CM | POA: Diagnosis not present

## 2016-01-28 DIAGNOSIS — M549 Dorsalgia, unspecified: Secondary | ICD-10-CM | POA: Diagnosis not present

## 2016-01-28 NOTE — Patient Outreach (Addendum)
[facility] [facility]) Care Management  01/28/2016  Barbara Blackburn 08-14-1929 [id number]  Subjective: Received voicemail message from Doctor Phillips at [facility] ([place] office), states she is returning call and request call back.  Telephone call to provider's office, spoke with Colletta Maryland, and transferred to [employer].   Left HIPAA compliant voicemail for [place] and requested call back.  Telephone call to provider's office, spoke with Colletta Maryland, transferred to [place], and HIPAA verified.   RNCM advised of referral status and follow up plan.  Patient will continue to receive [place] Management services.   Objective: Per chart review: Patient has ED visit on 01/07/16 for shortness of breath. Patient has a history of: Nonischemic cardiomyopathy, Coronary artery disease, Chronic atrial fibrillation, Essential hypertension benign, chronic systolic congestive heart failure, Dyslipidemia, Automatic implantable cardioverter-defibrillator in situand Degenerative joint disease.Last office visit with primary provider Kathrine Haddock FNP on 01/25/16.    Assessment: Received MD referral on 01/12/16. Referral source: [place] at [facility] FNP's office. Telephone screen completion with patient and patient's daughter per patient's request. Patient will receive [place] services. No Telephonic RNCM services needed at this time.   Plan: RNCM has referred patient to [place] for home safety evaluation, medication review, congestive heart failure disease education, congestive heart failure disease monitoring, and possible home care coordination.    Lamarkus Nebel H. Annia Friendly, BSN, [place] Management [facility] Telephonic CM Phone: [phone] Fax: [phone]

## 2016-01-28 NOTE — Telephone Encounter (Signed)
Darlene from [facility] called, they have been in contact with patient, someone will be out for an initial assessment of patient within ten days.

## 2016-01-31 NOTE — Telephone Encounter (Signed)
Called patient's daughter and let her know about [facility]. She stated that they had already called but thanked me for calling her back about this.

## 2016-02-01 DIAGNOSIS — Z7901 Long term (current) use of anticoagulants: Secondary | ICD-10-CM | POA: Diagnosis not present

## 2016-02-01 DIAGNOSIS — E7800 Pure hypercholesterolemia, unspecified: Secondary | ICD-10-CM | POA: Diagnosis not present

## 2016-02-01 DIAGNOSIS — M6281 Muscle weakness (generalized): Secondary | ICD-10-CM | POA: Diagnosis not present

## 2016-02-01 DIAGNOSIS — G8929 Other chronic pain: Secondary | ICD-10-CM | POA: Diagnosis not present

## 2016-02-01 DIAGNOSIS — M549 Dorsalgia, unspecified: Secondary | ICD-10-CM | POA: Diagnosis not present

## 2016-02-01 DIAGNOSIS — M25552 Pain in left hip: Secondary | ICD-10-CM | POA: Diagnosis not present

## 2016-02-02 ENCOUNTER — Encounter: Payer: Self-pay | Admitting: *Deleted

## 2016-02-02 ENCOUNTER — Other Ambulatory Visit: Payer: Self-pay | Admitting: *Deleted

## 2016-02-02 NOTE — Patient Outreach (Signed)
[facility] [facility]) Care Management  02/02/2016  Barbara Blackburn Jan 26, 1929 [id number]  Subjective Initial telephone call to Barbara Blackburn , daughter of Barbara Blackburn, which has given verbal consent for [facility] to speak with her daughter on her behalf.  Barbara Blackburn discussed concern regarding Barbara Blackburn getting weaker, patient is currently  followed by [facility], home health physical therapy and request has been made for occupational therapy to follow and Barbara Blackburn states she can see some difference.  Barbara Blackburn discussed that she has recently hired someone to stay with her mother when they have to be away for the day to make sure she eats properly not just snacks.  Assessment Barbara Blackburn is agreeable to arranging a home visit for RN to assess for further needs.  Plan Initial home visit has been scheduled based on caregivers schedule need to meet patient at home in the next 2 weeks. Will send MD barrier letter of involvement and patient welcome letter.  Barbara Draft, RN, [place] Management [phone]- Mobile [phone]- Toll Free Main Office

## 2016-02-03 ENCOUNTER — Other Ambulatory Visit: Payer: Self-pay | Admitting: Unknown Physician Specialty

## 2016-02-03 DIAGNOSIS — M549 Dorsalgia, unspecified: Secondary | ICD-10-CM | POA: Diagnosis not present

## 2016-02-03 DIAGNOSIS — M25552 Pain in left hip: Secondary | ICD-10-CM | POA: Diagnosis not present

## 2016-02-03 DIAGNOSIS — E7800 Pure hypercholesterolemia, unspecified: Secondary | ICD-10-CM | POA: Diagnosis not present

## 2016-02-03 DIAGNOSIS — G8929 Other chronic pain: Secondary | ICD-10-CM | POA: Diagnosis not present

## 2016-02-03 DIAGNOSIS — M6281 Muscle weakness (generalized): Secondary | ICD-10-CM | POA: Diagnosis not present

## 2016-02-03 DIAGNOSIS — Z7901 Long term (current) use of anticoagulants: Secondary | ICD-10-CM | POA: Diagnosis not present

## 2016-02-08 ENCOUNTER — Ambulatory Visit (INDEPENDENT_AMBULATORY_CARE_PROVIDER_SITE_OTHER): Payer: Medicare Other | Admitting: *Deleted

## 2016-02-08 DIAGNOSIS — Z7901 Long term (current) use of anticoagulants: Secondary | ICD-10-CM | POA: Diagnosis not present

## 2016-02-08 DIAGNOSIS — Z95810 Presence of automatic (implantable) cardiac defibrillator: Secondary | ICD-10-CM

## 2016-02-08 DIAGNOSIS — M549 Dorsalgia, unspecified: Secondary | ICD-10-CM | POA: Diagnosis not present

## 2016-02-08 DIAGNOSIS — I5022 Chronic systolic (congestive) heart failure: Secondary | ICD-10-CM | POA: Diagnosis not present

## 2016-02-08 DIAGNOSIS — I4891 Unspecified atrial fibrillation: Secondary | ICD-10-CM

## 2016-02-08 DIAGNOSIS — E7800 Pure hypercholesterolemia, unspecified: Secondary | ICD-10-CM | POA: Diagnosis not present

## 2016-02-08 DIAGNOSIS — M25552 Pain in left hip: Secondary | ICD-10-CM | POA: Diagnosis not present

## 2016-02-08 DIAGNOSIS — G8929 Other chronic pain: Secondary | ICD-10-CM | POA: Diagnosis not present

## 2016-02-08 DIAGNOSIS — M6281 Muscle weakness (generalized): Secondary | ICD-10-CM | POA: Diagnosis not present

## 2016-02-08 NOTE — Progress Notes (Signed)
Remote ICD transmission.   

## 2016-02-11 DIAGNOSIS — G8929 Other chronic pain: Secondary | ICD-10-CM | POA: Diagnosis not present

## 2016-02-11 DIAGNOSIS — E7800 Pure hypercholesterolemia, unspecified: Secondary | ICD-10-CM | POA: Diagnosis not present

## 2016-02-11 DIAGNOSIS — Z7901 Long term (current) use of anticoagulants: Secondary | ICD-10-CM | POA: Diagnosis not present

## 2016-02-11 DIAGNOSIS — M25552 Pain in left hip: Secondary | ICD-10-CM | POA: Diagnosis not present

## 2016-02-11 DIAGNOSIS — M549 Dorsalgia, unspecified: Secondary | ICD-10-CM | POA: Diagnosis not present

## 2016-02-11 DIAGNOSIS — M6281 Muscle weakness (generalized): Secondary | ICD-10-CM | POA: Diagnosis not present

## 2016-02-14 ENCOUNTER — Encounter: Payer: Self-pay | Admitting: *Deleted

## 2016-02-14 ENCOUNTER — Other Ambulatory Visit: Payer: Self-pay | Admitting: *Deleted

## 2016-02-14 NOTE — Patient Outreach (Signed)
[place] [facility]) Care Management   02/14/2016  Barbara Blackburn 1929-07-23 [id number]  Barbara Blackburn is an 82 y.o. female  Subjective:  Barbara Blackburn report she is doing okay on today, except for her back discomfort, she discussed her visit to chiropractor today for back treatment and reports feeling some better since visit , discussed how she used to be more active but is now is unable to some of the things she did in the past.  Patient discussed that she receives home health physical therapy twice weekly and occupational therapy is begin services also through [facility] home health. Patient discussed her fear of falling and does not use her walk in shower. Barbara Blackburn daughter has arranged for patient to have personal care worker to stay with patient 3 hours 2 days a week.  Patient discussed her upcoming appointment with new cardiologist.   Objective: BP 112/60 mmHg  Pulse 62  Resp 18  SpO2 97%  LMP  (LMP Unknown) Patient resting in her recliner chair , with a  pillow around back of her neck.  Review of Systems  Constitutional: Negative.   HENT: Negative.   Eyes: Negative.   Respiratory: Negative.   Cardiovascular: Positive for leg swelling.       Slight ankle edmea  Gastrointestinal: Negative.   Genitourinary: Negative.   Musculoskeletal: Positive for back pain. Negative for falls.  Skin: Negative.   Neurological: Negative.        Patient reports vertigo episodes at times, denies today  Endo/Heme/Allergies: Negative.   Psychiatric/Behavioral: Positive for depression.       Reports memory problems at times    Physical Exam  Constitutional: She is oriented to person, place, and time. She appears well-developed and well-nourished.  Cardiovascular: Normal rate.   Respiratory: Effort normal.  GI: Soft.  Musculoskeletal:  Lower back discomfort, uses a cane  Neurological: She is alert and oriented to person, place, and time.  Skin: Skin is warm and dry.   Psychiatric: She has a normal mood and affect. Her behavior is normal. Judgment and thought content normal.    Encounter Medications:   Outpatient Encounter Prescriptions as of 02/14/2016  Medication Sig Note  . acetaminophen (TYLENOL) 500 MG tablet Take 500 mg by mouth every 6 (six) hours as needed.   [name] albuterol (PROVENTIL) (2.5 MG/3ML) 0.083% nebulizer solution Take 3 mLs (2.5 mg total) by nebulization every 4 (four) hours as needed for wheezing or shortness of breath.   [name] amLODipine (NORVASC) 5 MG tablet TAKE 1 TABLET (5 MG TOTAL) BY MOUTH DAILY.   [name] CRESTOR 20 MG tablet TAKE 1/2 TAB DAILY   . furosemide (LASIX) 40 MG tablet Take 40 mg by mouth 1 dose over 46 hours.     [name] ibuprofen (ADVIL,MOTRIN) 800 MG tablet Take 800 mg by mouth every 8 (eight) hours as needed. Reported on 01/10/2016   . lidocaine-prilocaine (EMLA) cream Apply 1 application topically as needed. To left shoulder   . losartan (COZAAR) 50 MG tablet TAKE 1 TABLET BY MOUTH EVERY DAY   . metoprolol succinate (TOPROL-XL) 50 MG 24 hr tablet Take 1 tablet (50 mg total) by mouth daily. Take with or immediately following a meal.   . potassium bicarbonate (KLOR-CON/EF) 25 MEQ disintegrating tablet Take 1 tablet (25 mEq total) by mouth daily.   . Probiotic Product (ALIGN) 4 MG CAPS Take by mouth daily.   [name] spironolactone (ALDACTONE) 25 MG tablet Take 25 mg by mouth daily.     [name]  warfarin (COUMADIN) 5 MG tablet Take 5 mg by mouth See admin instructions. 5 mg on Su, M, Tu, and W. 2.5 mg on Th, F, Sa 09/01/2015: Received from: External Pharmacy   No facility-administered encounter medications on file as of 02/14/2016.    Functional Status:   In your present state of health, do you have any difficulty performing the following activities: 02/14/2016 05/24/2015  Hearing? Tempie Donning  Vision? N N  Difficulty concentrating or making decisions? Tempie Donning  Walking or climbing stairs? Y N  Dressing or bathing? N N  Doing errands, shopping? Y N  Preparing  Food and eating ? Y -  Using the Toilet? N -  In the past six months, have you accidently leaked urine? Y -  Do you have problems with loss of bowel control? Y -  Managing your Medications? Y -  Managing your Finances? Y -  Housekeeping or managing your Housekeeping? Y -   Fall Risk  02/14/2016 01/27/2016 01/27/2016 05/24/2015 05/24/2015  Falls in the past year? Yes (No Data) Yes Yes Yes  Number falls in past yr: 1 (No Data) 1 1 1   Injury with Fall? Yes - Yes No No  Risk Factor Category  High Fall Risk - - - -  Risk for fall due to : History of fall(s) - History of fall(s) - -  Follow up Falls evaluation completed;Falls prevention discussed;Education provided - Falls prevention discussed;Education provided - -   Fall/Depression Screening:    PHQ 2/9 Scores 02/14/2016 05/24/2015 05/24/2015 02/15/2015  PHQ - 2 Score 4 2 2  0  PHQ- 9 Score 9 - 6 -    Assessment:  Initial home visit patient 's daughter Barbara Blackburn present.  Heart Failure: Patient will benefit from care management services for education and management of chronic disease.Patient currently does not weigh on a regular basis, has scales, available at home.  Hypertension- Patient's daughter prepares patient medication in a pill organizer, this system is working well for them, denied any difficulty with cost of purchasing medication. Patient has blood pressure monitor in home for use.  Falls- Will benefit from continued education and management of fall prevention , denies  recent fall, will benefit from occupational therapy, due to patient's fear of having a fall limiting her from using her shower.  Will continue to assess for further education and care management needs.  Plan:  RN care management will collaborate with patient/caregiver to establish patient centered  care goals. Will Provide education and management of heart failure. Provide and review EMMI handouts on Heart failure, being salt smart, weighing daily, and preventing  falls.   [facility] CM Care Plan Problem One        Most Recent Value   Care Plan Problem One  Knowledge Deficit related to heart failure   Role Documenting the Problem One  Care Management Coordinator   Care Plan for Problem One  Active   [facility] Long Term Goal (31-90 days)  Patient will verbalize increased knowledge related to Heart Failure education and management strategies in the next 90 days   [facility] Long Term Goal Start Date  02/14/16   Interventions for Problem One Long Term Goal  Educated on [facility] care managment role in providing education and management    [facility] CM Short Term Goal #1 (0-30 days)  Patient report weighing at least weekly in the next 31 days   [facility] CM Short Term Goal #1 Start Date  02/14/16   Interventions for Short Term Goal #  1  Educated on importance of tracking weighs, to identify small increments in weighs sooner, Reviewed EMMI hanout on weighing daily   [facility] CM Short Term Goal #2 (0-30 days)  Patient/caregiver will report monitoring blood pressure at least weekly in the next 30 days   [facility] CM Short Term Goal #2 Start Date  02/14/16   Interventions for Short Term Goal #2  Verified patient has blood pressure monitor at home, and importance of keeping a record and notifying MD of reading outside her normal readings    [facility] CM Short Term Goal #3 (0-30 days)  Patient will be able to state at least 2 symptoms of heart failure and action to take in the next 30 days   [facility] CM Short Term Goal #3 Start Date  02/14/16   Interventions for Short Tern Goal #3  Educated patient on heart failure zones,reviewed all zones, provided heart failure packet and magnet    [facility] CM Care Plan Problem Two        Most Recent Value   Care Plan Problem Two  fall prevention    Role Documenting the Problem Two  Care Management Coordinator   Care Plan for Problem Two  Active   Interventions for Problem Two Long Term Goal   Educated patient on general fall prevention measures, provided and reviewed EMMI handout on  preventing falls    [facility] Long Term Goal (31-90) days  Patient will report increased knowledge of fall prevention strategies in the next 31 days   [facility] Long Term Goal Start Date  02/14/16   [facility] CM Short Term Goal #1 (0-30 days)  Patient walk in home at least 2 days per week in the next 30 days   [facility] CM Short Term Goal #1 Start Date  02/14/16   Interventions for Short Term Goal #2   Educated patient on the importance of exercise to improve balance and stamina to decrease fall risk   [facility] CM Short Term Goal #2 (0-30 days)  Patient will report not having a fall in the next 30 days   [facility] CM Short Term Goal #2 Start Date  02/14/16   Interventions for Short Term Goal #2  Educated on importance of using assist device at all times to prevent falls.      [name], RN, [place] Management [phone]- Mobile [phone]- Toll Free Main Office

## 2016-02-15 ENCOUNTER — Ambulatory Visit (INDEPENDENT_AMBULATORY_CARE_PROVIDER_SITE_OTHER): Payer: Medicare Other | Admitting: Cardiology

## 2016-02-15 ENCOUNTER — Encounter: Payer: Self-pay | Admitting: Cardiology

## 2016-02-15 VITALS — BP 137/75 | HR 64 | Ht 62.0 in | Wt 173.0 lb

## 2016-02-15 DIAGNOSIS — I2510 Atherosclerotic heart disease of native coronary artery without angina pectoris: Secondary | ICD-10-CM

## 2016-02-15 DIAGNOSIS — I10 Essential (primary) hypertension: Secondary | ICD-10-CM | POA: Diagnosis not present

## 2016-02-15 DIAGNOSIS — I5022 Chronic systolic (congestive) heart failure: Secondary | ICD-10-CM

## 2016-02-15 DIAGNOSIS — I4891 Unspecified atrial fibrillation: Secondary | ICD-10-CM | POA: Diagnosis not present

## 2016-02-15 DIAGNOSIS — E785 Hyperlipidemia, unspecified: Secondary | ICD-10-CM

## 2016-02-15 LAB — CUP PACEART REMOTE DEVICE CHECK
Brady Statistic AP VS Percent: 0 %
Brady Statistic RA Percent Paced: 0 %
Brady Statistic RV Percent Paced: 29.6 %
HIGH POWER IMPEDANCE MEASURED VALUE: 456 Ohm
HIGH POWER IMPEDANCE MEASURED VALUE: 48 Ohm
HighPow Impedance: 58 Ohm
Implantable Lead Implant Date: 20070511
Implantable Lead Location: 753860
Implantable Lead Model: 5076
Implantable Lead Model: 5092
Lead Channel Impedance Value: 418 Ohm
Lead Channel Impedance Value: 589 Ohm
Lead Channel Sensing Intrinsic Amplitude: 0.25 mV
Lead Channel Sensing Intrinsic Amplitude: 0.25 mV
Lead Channel Sensing Intrinsic Amplitude: 3.25 mV
Lead Channel Sensing Intrinsic Amplitude: 3.25 mV
Lead Channel Setting Pacing Pulse Width: 0.4 ms
MDC IDC LEAD IMPLANT DT: 20070511
MDC IDC LEAD IMPLANT DT: 20070511
MDC IDC LEAD LOCATION: 753859
MDC IDC LEAD LOCATION: 753860
MDC IDC LEAD MODEL: 6949
MDC IDC MSMT BATTERY VOLTAGE: 2.65 V
MDC IDC MSMT LEADCHNL RV PACING THRESHOLD AMPLITUDE: 1.125 V
MDC IDC MSMT LEADCHNL RV PACING THRESHOLD PULSEWIDTH: 0.4 ms
MDC IDC SESS DTM: 20170613041811
MDC IDC SET LEADCHNL RV PACING AMPLITUDE: 2.5 V
MDC IDC SET LEADCHNL RV SENSING SENSITIVITY: 0.3 mV
MDC IDC STAT BRADY AP VP PERCENT: 0 %
MDC IDC STAT BRADY AS VP PERCENT: 29.6 %
MDC IDC STAT BRADY AS VS PERCENT: 70.4 %

## 2016-02-15 NOTE — Progress Notes (Signed)
Cardiology Office Note   Date:  02/15/2016   ID:  Barbara Blackburn, DOB [birth year], MRN [id number]  Referring Doctor:  Kathrine Haddock, NP   Cardiologist:   Wende Bushy, MD   Reason for consultation:  Chief Complaint  Patient presents with  . Other    Est. Primary Cardiologist c/o sob. Meds reviewed verbally with pt.      History of Present Illness: Barbara Blackburn is a 82 y.o. female who presents for Establishing cardiology care. Patient has history of A. fib, CHF, CAD.  In terms of A. fib, she's been in A. fib for a long time. Her heart rate is controlled with the current medication she's on. Also sees Dr. Caryl Comes for ICD device. She has been on Coumadin. She remembers a discussion about the no or anticoagulants. She has not decided whether she would like to switch.  In terms of CAD, she had a stent in the past she has no chest pain.  In terms of CHF, she has chronic shortness of breath with minimal exertion. This has been going on for many years now, no recent worsening or progression of her symptoms. There was also some confusion about the Lasix dose that she was supposed to take. She is now taking it on an everyday basis as well as potassium replacement.  Patient denies fever, cough, colds, abdominal pain. No PND, orthopnea, edema. She has been trying to watch her salt intake.   ROS:  Please see the history of present illness. Aside from mentioned under HPI, all other systems are reviewed and negative.     Past Medical History  Diagnosis Date  . Nonischemic cardiomyopathy ([place])   . Coronary artery disease   . Chronic atrial fibrillation ([place])   . CHF (congestive heart failure) (H[facility])     class 2  . Degenerative joint disease     severe  . Dyslipidemia   . 6949-lead 11/11/2013  . ICD-Medtronic 05/10/2009    Qualifier: Diagnosis of  By: Lovena Le, MD, [facility], Binnie Kand     Past Surgical History  Procedure Laterality Date  . Medtronic dual-chamber icd  01/05/2006      generator change April 2012  . Pci stent    . Replacement total knee       reports that she has never smoked. She has never used smokeless tobacco. She reports that she does not drink alcohol or use illicit drugs.   family history includes Aneurysm in her maternal grandmother and sister; Cancer in her mother and sister; Heart attack in her father; Hypertension in her sister.   Current Outpatient Prescriptions  Medication Sig Dispense Refill  . acetaminophen (TYLENOL) 500 MG tablet Take 500 mg by mouth every 6 (six) hours as needed.    [name] albuterol (PROVENTIL) (2.5 MG/3ML) 0.083% nebulizer solution Take 3 mLs (2.5 mg total) by nebulization every 4 (four) hours as needed for wheezing or shortness of breath. 150 vial 12  . amLODipine (NORVASC) 5 MG tablet TAKE 1 TABLET (5 MG TOTAL) BY MOUTH DAILY. 30 tablet 3  . CRESTOR 20 MG tablet TAKE 1/2 TAB DAILY 30 tablet 5  . furosemide (LASIX) 40 MG tablet Take 40 mg by mouth daily.     [name] ibuprofen (ADVIL,MOTRIN) 800 MG tablet Take 800 mg by mouth every 8 (eight) hours as needed. Reported on 01/10/2016    . lidocaine-prilocaine (EMLA) cream Apply 1 application topically as needed. To left shoulder    . losartan (COZAAR) 50 MG  tablet TAKE 1 TABLET BY MOUTH EVERY DAY 30 tablet 3  . metoprolol succinate (TOPROL-XL) 50 MG 24 hr tablet Take 1 tablet (50 mg total) by mouth daily. Take with or immediately following a meal. 30 tablet 11  . potassium bicarbonate (KLOR-CON/EF) 25 MEQ disintegrating tablet Take 1 tablet (25 mEq total) by mouth daily. 90 tablet 3  . Probiotic Product (ALIGN) 4 MG CAPS Take by mouth daily.    [name] warfarin (COUMADIN) 5 MG tablet Take 5 mg by mouth See admin instructions. 5 mg on Su, M, Tu, and W. 2.5 mg on Th, F, Sa  6   No current facility-administered medications for this visit.    Allergies: Clonazepam; Fluoxetine hcl; Lisinopril; and Zolpidem tartrate    PHYSICAL EXAM: VS:  BP 137/75 mmHg  Pulse 64  Ht 5\' 2"  (1.575 m)   Wt 173 lb (78.472 kg)  BMI 31.63 kg/m2  LMP  (LMP Unknown) , Body mass index is 31.63 kg/(m^2). Wt Readings from Last 3 Encounters:  02/15/16 173 lb (78.472 kg)  01/25/16 171 lb 3.2 oz (77.656 kg)  01/10/16 176 lb 12.8 oz (80.196 kg)    GENERAL:  well developed, well nourished, obese, not in acute distress HEENT: normocephalic, pink conjunctivae, anicteric sclerae, no xanthelasma, normal dentition, oropharynx clear NECK:  no neck vein engorgement, JVP normal, no hepatojugular reflux, carotid upstroke brisk and symmetric, no bruit, no thyromegaly, no lymphadenopathy LUNGS:  good respiratory effort, clear to auscultation bilaterally CV:  PMI not displaced, no thrills, no lifts, Irregular rhythm, S2 within normal limits, no palpable S3 or S4, no murmurs, no rubs, no gallops ABD:  Soft, nontender, nondistended, normoactive bowel sounds, no abdominal aortic bruit, no hepatomegaly, no splenomegaly MS: nontender back, no kyphosis, no scoliosis, no joint deformities EXT:  2+ DP/PT pulses, no edema, no varicosities, no cyanosis, no clubbing SKIN: warm, nondiaphoretic, normal turgor, no ulcers NEUROPSYCH: alert, oriented to person, place, and time, sensory/motor grossly intact, normal mood, appropriate affect  Recent Labs: 11/09/2015: TSH 3.329 01/07/2016: B Natriuretic Peptide 218.0*; Hemoglobin 14.3; Platelets 142* 01/25/2016: ALT 19; BUN 25; Creatinine, Ser 0.87; Potassium 5.0; Sodium 139   Lipid Panel    Component Value Date/Time   CHOL 147 01/25/2016 1341   CHOL 145 09/01/2015 1101   TRIG 201* 01/25/2016 1341   HDL 56 01/25/2016 1341   LDLCALC 51 01/25/2016 1341     Other studies Reviewed:  EKG:  The ekg from 02/15/2016 was personally reviewed by me and it revealed likely demand pacemaker with underlying rhythm to be atrial fibrillation. Nonspecific ST-T wave abnormalities. Ventricular rate 64 BPM.  Additional studies/ records that were reviewed personally reviewed by me today include:   From [place]: Lexi scan sestamibi study 10/16/2012 revealed LVEF of 50%, fixed inferior wall defect consistent with scar  Echo 11/30/2015: Left ventricle: The cavity size was normal. There was mild  concentric hypertrophy. Systolic function was normal. The  estimated ejection fraction was in the range of 60% to 65%. Wall  motion was normal; there were no regional wall motion  abnormalities. The study is not technically sufficient to allow  evaluation of LV diastolic function. - Mitral valve: There was mild regurgitation. - Left atrium: The atrium was moderately dilated. - Right ventricle: Pacer wire or catheter noted in right ventricle.  Systolic function was normal. - Tricuspid valve: There was mild-moderate regurgitation. - Pulmonary arteries: Systolic pressure was within the normal  range.   ASSESSMENT AND PLAN:  CAD s/p Xience stent to  LAD 12/2007 No chest pain. No ischemia from stress test 2014. Continue medical therapy.  CHF, systolic, based on echocardiogram from April 2017, EF has recovered Ischemic Cardiomyopathy S/p ICD - patient follows up with EP clinic Patient does not appear to be in acute congestive heart failure. Recommend to continue optimal medical therapy: Lasix, potassium, losartan, metoprolol. Trial with discontinuing Aldactone, and repeat BMP in 1 week. Daily weights and low sodium diet. Follow-up in one month.  Paroxysmal atrial fibrillation Ventricular rate is controlled. Patient recalls having a discussion with Dr. Caryl Comes about switching to NOACS. discussed benefits and risks of NOACS.  patient will like to think about this and determine which one is more affordable based on her insurance.   we will set her up with the Coumadin clinic.  Hypertension BP is well controlled. Continue monitoring BP. Continue current medical therapy and lifestyle changes.  Hyperlipidemia  patient on statin therapy, PCP following labs. LDL goal is less than 70.    Current medicines are reviewed at length with the patient today.  The patient does not have concerns regarding medicines.  Labs/ tests ordered today include:  Orders Placed This Encounter  Procedures  . Basic Metabolic Panel (BMET)  . EKG 12-Lead    I had a lengthy and detailed discussion with the patient regarding diagnoses, prognosis, diagnostic options, treatment options , and side effects of medications.   I counseled the patient on importance of lifestyle modification including heart healthy diet, regular physical activity .   Disposition:   FU with undersignedIn one month  I spent at least 45 minutes with the patient today and more than 50% of the time was spent counseling the patient and coordinating care.    Signed, Wende Bushy, MD  02/15/2016 11:26 AM    [facility]

## 2016-02-15 NOTE — Patient Instructions (Signed)
Medication Instructions:  Your physician has recommended you make the following change in your medication:  1. STOP taking spironolactone  2. Check with your insurance or pharmacy on pricing for xarelto and eliquis.   Labwork: Your physician recommends that you return for lab work in: 1 week for BMP.  Date & Time: ________________________________________________________   Testing/Procedures: None ordered  Follow-Up: Your physician recommends that you schedule a follow-up appointment in: 1 month with Dr. Yvone Neu.  Date & Time: __________________________________________________________  Your last visit with Dr. Caryl Comes was 11/09/15 with follow up in 1 year. We will send you a reminder letter in the mail to call and schedule that appointment.   It was a pleasure seeing you today here in the office. Please do not hesitate to give [place] a call back if you have any further questions. [place], BSN      Any Other Special Instructions Will Be Listed Below (If Applicable).     If you need a refill on your cardiac medications before your next appointment, please call your pharmacy.

## 2016-02-16 ENCOUNTER — Encounter: Payer: Self-pay | Admitting: Cardiology

## 2016-02-18 DIAGNOSIS — Z7901 Long term (current) use of anticoagulants: Secondary | ICD-10-CM | POA: Diagnosis not present

## 2016-02-18 DIAGNOSIS — M6281 Muscle weakness (generalized): Secondary | ICD-10-CM | POA: Diagnosis not present

## 2016-02-18 DIAGNOSIS — M549 Dorsalgia, unspecified: Secondary | ICD-10-CM | POA: Diagnosis not present

## 2016-02-18 DIAGNOSIS — M25552 Pain in left hip: Secondary | ICD-10-CM | POA: Diagnosis not present

## 2016-02-18 DIAGNOSIS — E7800 Pure hypercholesterolemia, unspecified: Secondary | ICD-10-CM | POA: Diagnosis not present

## 2016-02-18 DIAGNOSIS — G8929 Other chronic pain: Secondary | ICD-10-CM | POA: Diagnosis not present

## 2016-02-22 ENCOUNTER — Telehealth: Payer: Self-pay | Admitting: Cardiology

## 2016-02-22 ENCOUNTER — Other Ambulatory Visit (INDEPENDENT_AMBULATORY_CARE_PROVIDER_SITE_OTHER): Payer: Medicare Other | Admitting: *Deleted

## 2016-02-22 DIAGNOSIS — I4891 Unspecified atrial fibrillation: Secondary | ICD-10-CM | POA: Diagnosis not present

## 2016-02-22 NOTE — Telephone Encounter (Signed)
Pt daughter would like to get Xarelto or Eliquis  samples. States pt is coming in this a.m. For labwork. States both of these medications are the same price.

## 2016-02-22 NOTE — Telephone Encounter (Signed)
Patients daughter stated that both would cost the same based on her insurance. Her mother is coming for labwork today and she was wanting some samples. Let her know that I would have to send this to Dr. Yvone Neu for her to prescribe dosage and frequency before I could actually give her any samples. Patients daughter states that they would like for her to start on the Xarelto. Will forward to Dr. Yvone Neu for dosing. Verified pharmacy with her and let her know that Dr. Yvone Neu would be back on 03/01/16 and I would give her a call back at that time with information. She verbalized agreement with plan, understanding of our conversation, and had no further questions at this time.

## 2016-02-23 DIAGNOSIS — M6281 Muscle weakness (generalized): Secondary | ICD-10-CM | POA: Diagnosis not present

## 2016-02-23 DIAGNOSIS — G8929 Other chronic pain: Secondary | ICD-10-CM | POA: Diagnosis not present

## 2016-02-23 DIAGNOSIS — M25552 Pain in left hip: Secondary | ICD-10-CM | POA: Diagnosis not present

## 2016-02-23 DIAGNOSIS — Z7901 Long term (current) use of anticoagulants: Secondary | ICD-10-CM | POA: Diagnosis not present

## 2016-02-23 DIAGNOSIS — M549 Dorsalgia, unspecified: Secondary | ICD-10-CM | POA: Diagnosis not present

## 2016-02-23 DIAGNOSIS — E7800 Pure hypercholesterolemia, unspecified: Secondary | ICD-10-CM | POA: Diagnosis not present

## 2016-02-23 LAB — BASIC METABOLIC PANEL
BUN/Creatinine Ratio: 33 — ABNORMAL HIGH (ref 12–28)
BUN: 27 mg/dL (ref 8–27)
CO2: 22 mmol/L (ref 18–29)
CREATININE: 0.82 mg/dL (ref 0.57–1.00)
Calcium: 9.5 mg/dL (ref 8.7–10.3)
Chloride: 101 mmol/L (ref 96–106)
GFR calc Af Amer: 76 mL/min/{1.73_m2} (ref >59)
GFR, EST NON AFRICAN AMERICAN: 66 mL/min/{1.73_m2} (ref >59)
GLUCOSE: 78 mg/dL (ref 65–99)
Potassium: 4.6 mmol/L (ref 3.5–5.2)
Sodium: 144 mmol/L (ref 134–144)

## 2016-02-24 ENCOUNTER — Telehealth: Payer: Self-pay | Admitting: Unknown Physician Specialty

## 2016-02-24 DIAGNOSIS — H2513 Age-related nuclear cataract, bilateral: Secondary | ICD-10-CM | POA: Diagnosis not present

## 2016-02-24 DIAGNOSIS — G8929 Other chronic pain: Secondary | ICD-10-CM | POA: Diagnosis not present

## 2016-02-24 DIAGNOSIS — E7800 Pure hypercholesterolemia, unspecified: Secondary | ICD-10-CM | POA: Diagnosis not present

## 2016-02-24 DIAGNOSIS — M25552 Pain in left hip: Secondary | ICD-10-CM | POA: Diagnosis not present

## 2016-02-24 DIAGNOSIS — M549 Dorsalgia, unspecified: Secondary | ICD-10-CM | POA: Diagnosis not present

## 2016-02-24 DIAGNOSIS — Z7901 Long term (current) use of anticoagulants: Secondary | ICD-10-CM | POA: Diagnosis not present

## 2016-02-24 DIAGNOSIS — E119 Type 2 diabetes mellitus without complications: Secondary | ICD-10-CM | POA: Diagnosis not present

## 2016-02-24 DIAGNOSIS — M6281 Muscle weakness (generalized): Secondary | ICD-10-CM | POA: Diagnosis not present

## 2016-02-24 LAB — HM DIABETES EYE EXAM

## 2016-02-24 NOTE — Telephone Encounter (Signed)
Barbara Blackburn called and would like to request verbal orders to continue physical therapy 2 times a week for 3 weeks

## 2016-02-24 NOTE — Telephone Encounter (Signed)
Yours, came to me in error

## 2016-02-24 NOTE — Telephone Encounter (Signed)
Forwarded to CW 

## 2016-02-25 NOTE — Telephone Encounter (Signed)
Verbal order called in.

## 2016-02-25 NOTE — Telephone Encounter (Signed)
Verbal order given  

## 2016-02-29 NOTE — Telephone Encounter (Signed)
Can switch to Xarelto 20mg  po qd. Pls coordinate with coumadin clinic if she goes there.

## 2016-03-01 DIAGNOSIS — M6281 Muscle weakness (generalized): Secondary | ICD-10-CM | POA: Diagnosis not present

## 2016-03-01 DIAGNOSIS — M25552 Pain in left hip: Secondary | ICD-10-CM | POA: Diagnosis not present

## 2016-03-01 DIAGNOSIS — E7800 Pure hypercholesterolemia, unspecified: Secondary | ICD-10-CM | POA: Diagnosis not present

## 2016-03-01 DIAGNOSIS — G8929 Other chronic pain: Secondary | ICD-10-CM | POA: Diagnosis not present

## 2016-03-01 DIAGNOSIS — Z7901 Long term (current) use of anticoagulants: Secondary | ICD-10-CM | POA: Diagnosis not present

## 2016-03-01 DIAGNOSIS — M549 Dorsalgia, unspecified: Secondary | ICD-10-CM | POA: Diagnosis not present

## 2016-03-01 NOTE — Addendum Note (Signed)
Addended by: Valora Corporal on: 03/01/2016 04:45 PM   Modules accepted: Orders

## 2016-03-01 NOTE — Telephone Encounter (Signed)
Spoke with patient to let her know that we needed to check her INR and she said that she has not had that done in over a month. Let her know that we would need to check that first and if that number is less than 3 then we would be able to start the xarelto 20 mg once daily and then we would need to repeat labs in a month. In one month we would need to check CMP and CBC. She wanted me to call her daughter and discuss this information with her because she states that she wouldn't remember all of this. Will call and discuss this with her daughter before moving forward.

## 2016-03-01 NOTE — Telephone Encounter (Signed)
Pt's CrCl- 63 mL/min so agree with Xarelto 20mg  once daily dosing.  Pt will need INR check prior to switching to Xarelto.  INR must be below 3.0 in order to switch.

## 2016-03-01 NOTE — Telephone Encounter (Addendum)
Spoke with patients daughter and let her know that her mother needed a INR check before we could switch her to [employer]. Since patient is no longer seeing this doctor and has not had her coumadin checked in over a month I will order a one time check and we scheduled for it to be done 7/12 at 11:20AM. At that time if INR is less than 3 then we could start the xarelto 20 mg once daily per Dr. Yvone Neu and then schedule her for follow up labs CBC and CMP to be done a month later after starting dose. She verbalized understanding of instructions and appointment date and time.

## 2016-03-03 ENCOUNTER — Encounter: Payer: Self-pay | Admitting: Unknown Physician Specialty

## 2016-03-03 ENCOUNTER — Ambulatory Visit (INDEPENDENT_AMBULATORY_CARE_PROVIDER_SITE_OTHER): Payer: Medicare Other | Admitting: Unknown Physician Specialty

## 2016-03-03 VITALS — BP 127/80 | HR 66 | Temp 97.5°F | Ht 59.7 in | Wt 177.2 lb

## 2016-03-03 DIAGNOSIS — F0390 Unspecified dementia without behavioral disturbance: Secondary | ICD-10-CM | POA: Diagnosis not present

## 2016-03-03 DIAGNOSIS — I5022 Chronic systolic (congestive) heart failure: Secondary | ICD-10-CM | POA: Diagnosis not present

## 2016-03-03 DIAGNOSIS — Z0000 Encounter for general adult medical examination without abnormal findings: Secondary | ICD-10-CM

## 2016-03-03 DIAGNOSIS — E7800 Pure hypercholesterolemia, unspecified: Secondary | ICD-10-CM | POA: Diagnosis not present

## 2016-03-03 DIAGNOSIS — R600 Localized edema: Secondary | ICD-10-CM | POA: Insufficient documentation

## 2016-03-03 DIAGNOSIS — M25552 Pain in left hip: Secondary | ICD-10-CM | POA: Diagnosis not present

## 2016-03-03 DIAGNOSIS — Z7901 Long term (current) use of anticoagulants: Secondary | ICD-10-CM | POA: Diagnosis not present

## 2016-03-03 DIAGNOSIS — I10 Essential (primary) hypertension: Secondary | ICD-10-CM | POA: Diagnosis not present

## 2016-03-03 DIAGNOSIS — L989 Disorder of the skin and subcutaneous tissue, unspecified: Secondary | ICD-10-CM | POA: Diagnosis not present

## 2016-03-03 DIAGNOSIS — G8929 Other chronic pain: Secondary | ICD-10-CM | POA: Diagnosis not present

## 2016-03-03 DIAGNOSIS — M549 Dorsalgia, unspecified: Secondary | ICD-10-CM | POA: Diagnosis not present

## 2016-03-03 DIAGNOSIS — M6281 Muscle weakness (generalized): Secondary | ICD-10-CM | POA: Diagnosis not present

## 2016-03-03 NOTE — Assessment & Plan Note (Signed)
Stable, continue present medications.   

## 2016-03-03 NOTE — Assessment & Plan Note (Signed)
Mild with memory loss.  Pt with 24 hour supervision.  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

## 2016-03-03 NOTE — Assessment & Plan Note (Signed)
Per cardiology 

## 2016-03-03 NOTE — Progress Notes (Signed)
BP 127/80 mmHg  Pulse 66  Temp(Src) 97.5 F (36.4 C)  Ht 4' 11.7" (1.516 m)  Wt 177 lb 3.2 oz (80.377 kg)  BMI 34.97 kg/m2  SpO2 93%  LMP  (LMP Unknown)   Subjective:    Patient ID: Barbara Blackburn, female    DOB: [birth year], 82 y.o.   MRN: [id number]  HPI: Barbara Blackburn is a 82 y.o. female  Chief Complaint  Patient presents with  . Medicare Wellness   Depression screen [facility] 2/9 03/03/2016 02/14/2016 01/27/2016 05/24/2015 05/24/2015  Decreased Interest 0 1 (No Data) 1 1  Down, Depressed, Hopeless 1 3 (No Data) 1 1  PHQ - 2 Score 1 4 - 2 2  Altered sleeping - 0 - - 1  Tired, decreased energy - 3 - - 3  Change in appetite - 1 - - 0  Feeling bad or failure about yourself  - 1 - - 0  Trouble concentrating - 0 - - 0  Moving slowly or fidgety/restless - 0 - - 0  Suicidal thoughts - 0 - - 0  PHQ-9 Score - 9 - - 6  Difficult doing work/chores - Somewhat difficult - - Not difficult at all    Fall Risk  03/03/2016 02/14/2016 01/27/2016 01/27/2016 05/24/2015  Falls in the past year? No Yes (No Data) Yes Yes  Number falls in past yr: - 1 (No Data) 1 1  Injury with Fall? - Yes - Yes No  Risk Factor Category  - High Fall Risk - - -  Risk for fall due to : - History of fall(s) - History of fall(s) -  Follow up - Falls evaluation completed;Falls prevention discussed;Education provided - Falls prevention discussed;Education provided -   Functional Status Survey: Is the patient deaf or have difficulty hearing?: Yes Does the patient have difficulty seeing, even when wearing glasses/contacts?: No Does the patient have difficulty concentrating, remembering, or making decisions?: Yes Does the patient have difficulty walking or climbing stairs?: Yes Does the patient have difficulty dressing or bathing?: Yes Does the patient have difficulty doing errands alone such as visiting a doctor's office or shopping?: Yes Pt is living with her daughter who is able to meet her needs.    Advance care  planning has been done.    Memory:  States it is not as good as it used to be.  Daughter feels it is "something to watch."  Has some aphasia.   She could not recall 3 objects.  Refusing mini-mental.    Lower leg swelling She is weighing daily.  Feels her legs are beginning to get red.   Relevant past medical, surgical, family and social history reviewed and updated as indicated. Interim medical history since our last visit reviewed. Allergies and medications reviewed and updated.  Review of Systems  Constitutional: Positive for fatigue.  HENT: Negative.   Eyes: Negative.   Respiratory: Negative.   Cardiovascular: Negative.   Gastrointestinal: Negative.   Genitourinary: Negative.   Musculoskeletal: Negative.   Skin:       Thumb lesion  Allergic/Immunologic: Negative.   Neurological: Positive for speech difficulty.  Psychiatric/Behavioral: Positive for confusion and decreased concentration. Negative for behavioral problems.    Per HPI unless specifically indicated above     Objective:    BP 127/80 mmHg  Pulse 66  Temp(Src) 97.5 F (36.4 C)  Ht 4' 11.7" (1.516 m)  Wt 177 lb 3.2 oz (80.377 kg)  BMI 34.97 kg/m2  SpO2 93%  LMP  (  LMP Unknown)  Wt Readings from Last 3 Encounters:  03/03/16 177 lb 3.2 oz (80.377 kg)  02/15/16 173 lb (78.472 kg)  01/25/16 171 lb 3.2 oz (77.656 kg)    Physical Exam  Constitutional: She is oriented to person, place, and time. She appears well-developed and well-nourished. No distress.  HENT:  Head: Normocephalic and atraumatic.  Eyes: Conjunctivae and lids are normal. Right eye exhibits no discharge. Left eye exhibits no discharge. No scleral icterus.  Neck: Normal range of motion. Neck supple. No JVD present. Carotid bruit is not present.  Cardiovascular: Normal heart sounds.   Pulmonary/Chest: Effort normal.  Abdominal: Normal appearance. There is no splenomegaly or hepatomegaly.  Musculoskeletal: Normal range of motion.  Neurological:  She is alert and oriented to person, place, and time.  Skin: Skin is warm, dry and intact. No rash noted. No pallor.  Thumb lesion won't heal  Psychiatric: She has a normal mood and affect. Her behavior is normal. Judgment and thought content normal.      Assessment & Plan:   Problem List Items Addressed This Visit      Unprioritized   Chronic systolic heart failure [facility])    Per cardiology      Dementia    Mild with memory loss.  Pt with 24 hour supervision.  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        Lower leg edema    Suspect related to cardiologist removing Spironalactone.  Risks of medication may outweigh problems with swelling.  Consider vascular referral      Skin lesion of hand    Further evaluatin with dermatology.  Pt has an appointment       Other Visit Diagnoses    Annual physical exam            Follow up plan: Return in about 6 months (around 09/03/2016).

## 2016-03-03 NOTE — Assessment & Plan Note (Addendum)
Suspect related to cardiologist removing Spironalactone.  Risks of medication may outweigh problems with swelling.  Consider vascular referral

## 2016-03-03 NOTE — Assessment & Plan Note (Signed)
Further evaluatin with dermatology.  Pt has an appointment

## 2016-03-07 DIAGNOSIS — G8929 Other chronic pain: Secondary | ICD-10-CM | POA: Diagnosis not present

## 2016-03-07 DIAGNOSIS — M549 Dorsalgia, unspecified: Secondary | ICD-10-CM | POA: Diagnosis not present

## 2016-03-07 DIAGNOSIS — Z7901 Long term (current) use of anticoagulants: Secondary | ICD-10-CM | POA: Diagnosis not present

## 2016-03-07 DIAGNOSIS — E7800 Pure hypercholesterolemia, unspecified: Secondary | ICD-10-CM | POA: Diagnosis not present

## 2016-03-07 DIAGNOSIS — M6281 Muscle weakness (generalized): Secondary | ICD-10-CM | POA: Diagnosis not present

## 2016-03-07 DIAGNOSIS — M25552 Pain in left hip: Secondary | ICD-10-CM | POA: Diagnosis not present

## 2016-03-08 ENCOUNTER — Encounter: Payer: Self-pay | Admitting: Cardiology

## 2016-03-08 ENCOUNTER — Ambulatory Visit (INDEPENDENT_AMBULATORY_CARE_PROVIDER_SITE_OTHER): Payer: Medicare Other | Admitting: *Deleted

## 2016-03-08 DIAGNOSIS — I4891 Unspecified atrial fibrillation: Secondary | ICD-10-CM

## 2016-03-08 LAB — POCT INR: INR: 2.7

## 2016-03-08 MED ORDER — RIVAROXABAN 20 MG PO TABS: 20.0000 mg | ORAL_TABLET | Freq: Every day | ORAL | Status: DC

## 2016-03-08 NOTE — Progress Notes (Addendum)
Patient was here today to have her coumadin level checked and to be switched to xarelto.   Medication Samples have been provided to the patient.  Drug name: Xarelto       Strength: 20 mg        Qty: 6 bottles  LOT: 16MG [id number]  Exp.Date: 09/19  Dosing instructions: Take once daily with supper meal.     Barbara Blackburn 11:59 AM 03/08/2016  Confirmed return appointment for labs to be done in one month. Appointment scheduled and patients daughter verbalized understanding.

## 2016-03-10 DIAGNOSIS — M25552 Pain in left hip: Secondary | ICD-10-CM | POA: Diagnosis not present

## 2016-03-10 DIAGNOSIS — Z7901 Long term (current) use of anticoagulants: Secondary | ICD-10-CM | POA: Diagnosis not present

## 2016-03-10 DIAGNOSIS — G8929 Other chronic pain: Secondary | ICD-10-CM | POA: Diagnosis not present

## 2016-03-10 DIAGNOSIS — M6281 Muscle weakness (generalized): Secondary | ICD-10-CM | POA: Diagnosis not present

## 2016-03-10 DIAGNOSIS — M549 Dorsalgia, unspecified: Secondary | ICD-10-CM | POA: Diagnosis not present

## 2016-03-10 DIAGNOSIS — E7800 Pure hypercholesterolemia, unspecified: Secondary | ICD-10-CM | POA: Diagnosis not present

## 2016-03-13 ENCOUNTER — Other Ambulatory Visit: Payer: Self-pay | Admitting: *Deleted

## 2016-03-13 ENCOUNTER — Ambulatory Visit: Payer: Self-pay | Admitting: *Deleted

## 2016-03-13 NOTE — Patient Outreach (Addendum)
[facility]) Care Management   03/13/2016  Barbara Blackburn 07/30/1929 [id number]  Barbara Blackburn is an 82 y.o. female  Subjective:  I feel better today than yesterday, some days are like that, patient discussed her chronic back pain and follow up with chiropractor  Patient and caregiver discussed her visit with cardiologist , recent changes in medication including transition from coumadin to xarelto, and spironolactone being discontinued. Patient has received samples for xarelto from MD office and is aware of cost difference. Barbara Blackburn discussed that she is trying to weigh more, but sometimes forgets what weight is by the time she comes from bathroom to writing weight down . Barbara Blackburn discussed that her home health care physical therapy will be completed on this week.  Objective:  BP 138/60 mmHg  Pulse 68  Resp 18  SpO2 96%  LMP  (LMP Unknown) Review of Systems  Constitutional: Negative.   HENT: Negative.   Eyes: Negative.   Respiratory: Negative.   Cardiovascular: Positive for leg swelling.       Bilateral lower leg swelling  Gastrointestinal: Negative.   Genitourinary: Negative.   Musculoskeletal: Positive for back pain. Negative for falls.  Skin: Negative.   Neurological: Negative.   Endo/Heme/Allergies: Bruises/bleeds easily.  Psychiatric/Behavioral: Positive for memory loss.       Brain just goes blank at times, when I am trying to say something    Physical Exam  Constitutional: She is oriented to person, place, and time. She appears well-developed and well-nourished.  Cardiovascular: Normal rate, normal heart sounds and intact distal pulses.   Respiratory: Effort normal and breath sounds normal. No respiratory distress.  GI: Soft.  Neurological: She is alert and oriented to person, place, and time.  Skin: Skin is warm and dry.  bandaid with neosporin  to right thumb, blister that forms than burst,. Bilateral lower legs swelling and redness  bilateral calf area  Psychiatric: She has a normal mood and affect. Her behavior is normal. Judgment and thought content normal.    Encounter Medications:   Outpatient Encounter Prescriptions as of 03/13/2016  Medication Sig  . acetaminophen (TYLENOL) 500 MG tablet Take 500 mg by mouth every 6 (six) hours as needed.  [name] albuterol (PROVENTIL) (2.5 MG/3ML) 0.083% nebulizer solution Take 3 mLs (2.5 mg total) by nebulization every 4 (four) hours as needed for wheezing or shortness of breath.  [name] amLODipine (NORVASC) 5 MG tablet TAKE 1 TABLET (5 MG TOTAL) BY MOUTH DAILY.  [name] CRESTOR 20 MG tablet TAKE 1/2 TAB DAILY  . furosemide (LASIX) 40 MG tablet Take 40 mg by mouth daily.   [name] lidocaine-prilocaine (EMLA) cream Apply 1 application topically as needed. To left shoulder  . losartan (COZAAR) 50 MG tablet TAKE 1 TABLET BY MOUTH EVERY DAY  . metoprolol succinate (TOPROL-XL) 50 MG 24 hr tablet Take 1 tablet (50 mg total) by mouth daily. Take with or immediately following a meal.  . potassium bicarbonate (KLOR-CON/EF) 25 MEQ disintegrating tablet Take 1 tablet (25 mEq total) by mouth daily.  . Probiotic Product (ALIGN) 4 MG CAPS Take by mouth daily.  . rivaroxaban (XARELTO) 20 MG TABS tablet Take 1 tablet (20 mg total) by mouth daily with supper.  [name] ibuprofen (ADVIL,MOTRIN) 800 MG tablet Take 800 mg by mouth every 8 (eight) hours as needed. Reported on 03/13/2016   No facility-administered encounter medications on file as of 03/13/2016.    Functional Status:   In your present state of health, do you have any  difficulty performing the following activities: 03/03/2016 02/14/2016  Hearing? Tempie Donning  Vision? N N  Difficulty concentrating or making decisions? Tempie Donning  Walking or climbing stairs? Y Y  Dressing or bathing? Y N  Doing errands, shopping? Tempie Donning  Preparing Food and eating ? - Y  Using the Toilet? - N  In the past six months, have you accidently leaked urine? - Y  Do you have problems with loss of bowel  control? - Y  Managing your Medications? - Y  Managing your Finances? - Y  Housekeeping or managing your Housekeeping? - Y    Fall/Depression Screening:    PHQ 2/9 Scores 03/03/2016 02/14/2016 05/24/2015 05/24/2015 02/15/2015  PHQ - 2 Score '1 4 2 2 ' 0  PHQ- 9 Score - 9 - 6 -    Assessment:  Routine home visit, patient's daughter Barbara Blackburn present during visit. Barbara Blackburn discussed she loves to read and has started back to reading and doing her crossword puzzles.  Chronic Back Pain- improved, patient states treatment will decrease to weekly rather than twice weekly, gets relief with prn tylenol, she is not longer taking ibuprofen since she has started on xarelto.  Heart Failure- Reinforced heart failure monitoring and activating action plan for yellow zone symptoms Patient without weight increases, she does having swelling in her lower legs discussed use of lower leg support hose.   Fall Risk - No recent falls, completing physical therapy goals, and continues with tolerating activity at home. Patient has personal care assistant 2 days a week and more as family arranges care when they are not at home.  Discussed [facility] health coach program and benefits of continued education and management of chronic diseases, heart failure.  [name] and her daughter Barbara Blackburn denies any new concerns at this time Plan:  Will plan return home visit within a month Review and update progression on patient centered care goals.   THN CM Care Plan Problem One        Most Recent Value   Care Plan Problem One  Knowledge Deficit related to heart failure   Role Documenting the Problem One  Care Management Coordinator   Care Plan for Problem One  Active   THN Long Term Goal (31-90 days)  Patient will verbalize increased knowledge related to Heart Failure education and management strategies in the next 90 days   THN Long Term Goal Start Date  02/14/16   Interventions for Problem One Long Term Goal   Reinforced importance of monitoring weights , adhering to low salt diet, taking medications as prescribed, and notifying MD of concerns   THN CM Short Term Goal #1 (0-30 days)  Patient report weighing  at least 3 days weekly in the next 31 days [goal adjusted]   THN CM Short Term Goal #1 Start Date  03/13/16   Interventions for Short Term Goal #1  Reinforced with patient weighing on more consistent basis to track changes sooner, patient will take sticky pad to bathroom to record weight so she won't foget     THN CM Short Term Goal #2 (0-30 days)  Patient/caregiver will report monitoring blood pressure at least weekly in the next 30 days   THN CM Short Term Goal #2 Start Date  03/13/16   Interventions for Short Term Goal #2  Reinforced with caregiver regarding checking blood pressures weekly, related to recent medication changes   THN CM Short Term Goal #3 (0-30 days)  Patient will be able to state at  least 2 symptoms of heart failure and action to take in the next 30 days   THN CM Short Term Goal #3 Start Date  03/13/16   Interventions for Short Tern Goal #3  Reinforced yellow zones symptoms of heart failure and importance of notifying MD as part of the action plan for symptoms    [facility] CM Care Plan Problem Two        Most Recent Value   Care Plan Problem Two  fall prevention    Role Documenting the Problem Two  Care Management [place] for Problem Two  Active   Interventions for Problem Two Long Term Goal   Reviewed measures for fall prevention, continuing exercises after home health therapy services completed.   THN Long Term Goal (31-90) days  Patient will report increased knowledge of fall prevention strategies in the next 31 days   THN Long Term Goal Start Date  03/13/16   THN CM Short Term Goal #1 (0-30 days)  Patient walk in home at least 2 days per week in the next 30 days   THN CM Short Term Goal #1 Start Date  02/14/16   [facility] CM Short Term Goal #1 Met Date   03/13/16   THN  CM Short Term Goal #2 (0-30 days)  Patient will report not having a fall in the next 30 days   THN CM Short Term Goal #2 Start Date  02/14/16   Interventions for Short Term Goal #2  Caregiver will get bedside commode from storage to place over commode to increase safety ,     [name], RN, [place] [facility] [phone]- Mobile [phone]- [place]

## 2016-03-14 DIAGNOSIS — M25552 Pain in left hip: Secondary | ICD-10-CM | POA: Diagnosis not present

## 2016-03-14 DIAGNOSIS — Z7901 Long term (current) use of anticoagulants: Secondary | ICD-10-CM | POA: Diagnosis not present

## 2016-03-14 DIAGNOSIS — G8929 Other chronic pain: Secondary | ICD-10-CM | POA: Diagnosis not present

## 2016-03-14 DIAGNOSIS — M549 Dorsalgia, unspecified: Secondary | ICD-10-CM | POA: Diagnosis not present

## 2016-03-14 DIAGNOSIS — E7800 Pure hypercholesterolemia, unspecified: Secondary | ICD-10-CM | POA: Diagnosis not present

## 2016-03-14 DIAGNOSIS — M6281 Muscle weakness (generalized): Secondary | ICD-10-CM | POA: Diagnosis not present

## 2016-03-16 ENCOUNTER — Telehealth: Payer: Self-pay | Admitting: Cardiology

## 2016-03-16 DIAGNOSIS — Z7901 Long term (current) use of anticoagulants: Secondary | ICD-10-CM | POA: Diagnosis not present

## 2016-03-16 DIAGNOSIS — M549 Dorsalgia, unspecified: Secondary | ICD-10-CM | POA: Diagnosis not present

## 2016-03-16 DIAGNOSIS — M6281 Muscle weakness (generalized): Secondary | ICD-10-CM | POA: Diagnosis not present

## 2016-03-16 DIAGNOSIS — E7800 Pure hypercholesterolemia, unspecified: Secondary | ICD-10-CM | POA: Diagnosis not present

## 2016-03-16 DIAGNOSIS — M25552 Pain in left hip: Secondary | ICD-10-CM | POA: Diagnosis not present

## 2016-03-16 DIAGNOSIS — G8929 Other chronic pain: Secondary | ICD-10-CM | POA: Diagnosis not present

## 2016-03-16 NOTE — Telephone Encounter (Signed)
S/w pt daughter, [name], (on [place]) who reports pt had xray one month ago at Dr. Orinda Kenner chiropractor office which showed abdominal aneurysm. States Dr. Olen Cordial sent to Dr. Tamala Julian in [facility] for second opinion. Pt was then instructed to f/u with an abdominal ultrasound. Daughter calls asking if Dr. Yvone Neu can order this.  [name] states they were not told it was urgent.  Pt has appointment 7/25 with Dr. Yvone Neu. Advised daughter to bring records and test results to appointment for MD to review.  Will forward to Dr. Yvone Neu to make aware. Daughter verbalized understanding and is agreeable w/plan.

## 2016-03-16 NOTE — Telephone Encounter (Signed)
Patient's daughter [name] called. Her mother had a xray at her chiropractor's office and he discovered a abdominal aneurysm and needs a ultrasound. Can Dr. Yvone Neu please order a ultrasound. Please call daughter.

## 2016-03-21 ENCOUNTER — Ambulatory Visit (INDEPENDENT_AMBULATORY_CARE_PROVIDER_SITE_OTHER): Payer: Medicare Other | Admitting: Cardiology

## 2016-03-21 ENCOUNTER — Encounter: Payer: Self-pay | Admitting: Cardiology

## 2016-03-21 VITALS — BP 148/70 | HR 72 | Ht 61.0 in | Wt 176.0 lb

## 2016-03-21 DIAGNOSIS — I2510 Atherosclerotic heart disease of native coronary artery without angina pectoris: Secondary | ICD-10-CM | POA: Diagnosis not present

## 2016-03-21 DIAGNOSIS — I4891 Unspecified atrial fibrillation: Secondary | ICD-10-CM

## 2016-03-21 DIAGNOSIS — I10 Essential (primary) hypertension: Secondary | ICD-10-CM

## 2016-03-21 DIAGNOSIS — E785 Hyperlipidemia, unspecified: Secondary | ICD-10-CM

## 2016-03-21 DIAGNOSIS — I700 Atherosclerosis of aorta: Secondary | ICD-10-CM

## 2016-03-21 DIAGNOSIS — I509 Heart failure, unspecified: Secondary | ICD-10-CM

## 2016-03-21 MED ORDER — ASPIRIN EC 81 MG PO TBEC: 81.0000 mg | DELAYED_RELEASE_TABLET | Freq: Every day | ORAL | 3 refills | Status: DC

## 2016-03-21 NOTE — Progress Notes (Signed)
Cardiology Office Note   Date:  03/21/2016   ID:  Barbara Blackburn, DOB September 21, 1928, MRN [id number]  Referring Doctor:  Barbara Haddock, NP   Cardiologist:   Barbara Bushy, MD   Reason for consultation:  Chief Complaint  Patient presents with  . Other    1 month f/u discuss xray from chiropractor [facility]. Meds reviewed verbally with pt.      History of Present Illness: Barbara Blackburn is a 82 y.o. female who presents for Establishing cardiology care. Patient has history of A. fib, CHF, CAD.  In terms of A. fib, she's been in A. fib for a long time. Her heart rate is controlled with the current medication she's on. Also sees Dr. Caryl Blackburn for ICD device. She has been on Coumadin. She remembers a discussion about the no or anticoagulants. She has not decided whether she would like to switch.  In terms of CAD, she had a stent in the past she has no chest pain.  In terms of CHF, she has chronic shortness of breath with minimal exertion. This has been going on for many years now, no recent worsening or progression of her symptoms. There was also some confusion about the Lasix dose that she was supposed to take. She is now taking it on an everyday basis as well as potassium replacement.  Patient denies fever, cough, colds, abdominal pain. No PND, orthopnea, edema. She has been trying to watch her salt intake.   ROS:  Please see the history of present illness. Aside from mentioned under HPI, all other systems are reviewed and negative.     Past Medical History:  Diagnosis Date  . 6949-lead 11/11/2013  . CHF (congestive heart failure) (HCC)    class 2  . Chronic atrial fibrillation ([place])   . Coronary artery disease   . Degenerative joint disease    severe  . Dyslipidemia   . ICD-Medtronic 05/10/2009   Qualifier: Diagnosis of  By: Lovena Le, MD, [facility], Binnie Kand   . Nonischemic cardiomyopathy [facility])     Past Surgical History:  Procedure Laterality Date  . Medtronic dual-chamber ICD   01/05/2006   generator change April 2012  . PCI stent    . REPLACEMENT TOTAL KNEE       reports that she has never smoked. She has never used smokeless tobacco. She reports that she does not drink alcohol or use drugs.   family history includes Aneurysm in her maternal grandmother and sister; Cancer in her mother and sister; Heart attack in her father; Hypertension in her sister.   Current Outpatient Prescriptions  Medication Sig Dispense Refill  . acetaminophen (TYLENOL) 500 MG tablet Take 500 mg by mouth every 6 (six) hours as needed.    [name] albuterol (PROVENTIL) (2.5 MG/3ML) 0.083% nebulizer solution Take 3 mLs (2.5 mg total) by nebulization every 4 (four) hours as needed for wheezing or shortness of breath. 150 vial 12  . amLODipine (NORVASC) 5 MG tablet TAKE 1 TABLET (5 MG TOTAL) BY MOUTH DAILY. 30 tablet 3  . CRESTOR 20 MG tablet TAKE 1/2 TAB DAILY 30 tablet 5  . furosemide (LASIX) 40 MG tablet Take 40 mg by mouth daily.     [name] ibuprofen (ADVIL,MOTRIN) 800 MG tablet Take 800 mg by mouth every 8 (eight) hours as needed. Reported on 03/13/2016    . lidocaine-prilocaine (EMLA) cream Apply 1 application topically as needed. To left shoulder    . losartan (COZAAR) 50 MG tablet TAKE 1  TABLET BY MOUTH EVERY DAY 30 tablet 3  . metoprolol succinate (TOPROL-XL) 50 MG 24 hr tablet Take 1 tablet (50 mg total) by mouth daily. Take with or immediately following a meal. 30 tablet 11  . potassium bicarbonate (KLOR-CON/EF) 25 MEQ disintegrating tablet Take 1 tablet (25 mEq total) by mouth daily. (Patient taking differently: Take 12.5 mEq by mouth daily. ) 90 tablet 3  . Probiotic Product (ALIGN) 4 MG CAPS Take by mouth daily.    . rivaroxaban (XARELTO) 20 MG TABS tablet Take 1 tablet (20 mg total) by mouth daily with supper. 30 tablet 6  . aspirin EC 81 MG tablet Take 1 tablet (81 mg total) by mouth daily. 90 tablet 3   No current facility-administered medications for this visit.     Allergies:  Clonazepam; Fluoxetine hcl; Lisinopril; and Zolpidem tartrate    PHYSICAL EXAM: VS:  BP (!) 148/70 (BP Location: Left Arm, Patient Position: Sitting, Cuff Size: Normal)   Pulse 72   Ht 5\' 1"  (1.549 m)   Wt 176 lb (79.8 kg)   LMP  (LMP Unknown)   BMI 33.25 kg/m  , Body mass index is 33.25 kg/m. Wt Readings from Last 3 Encounters:  03/21/16 176 lb (79.8 kg)  03/03/16 177 lb 3.2 oz (80.4 kg)  02/15/16 173 lb (78.5 kg)    GENERAL:  well developed, well nourished, obese, not in acute distress HEENT: normocephalic, pink conjunctivae, anicteric sclerae, no xanthelasma, normal dentition, oropharynx clear NECK:  no neck vein engorgement, JVP normal, no hepatojugular reflux, carotid upstroke brisk and symmetric, no bruit, no thyromegaly, no lymphadenopathy LUNGS:  good respiratory effort, clear to auscultation bilaterally CV:  PMI not displaced, no thrills, no lifts, Irregular rhythm, S2 within normal limits, no palpable S3 or S4, no murmurs, no rubs, no gallops ABD:  Soft, nontender, nondistended, normoactive bowel sounds, no abdominal aortic bruit, no hepatomegaly, no splenomegaly MS: nontender back, no kyphosis, no scoliosis, no joint deformities EXT:  2+ DP/PT pulses, no edema, no varicosities, no cyanosis, no clubbing SKIN: warm, nondiaphoretic, normal turgor, no ulcers NEUROPSYCH: alert, oriented to person, place, and time, sensory/motor grossly intact, normal mood, appropriate affect  Recent Labs: 11/09/2015: TSH 3.329 01/07/2016: B Natriuretic Peptide 218.0; Hemoglobin 14.3; Platelets 142 01/25/2016: ALT 19 02/22/2016: BUN 27; Creatinine, Ser 0.82; Potassium 4.6; Sodium 144   Lipid Panel    Component Value Date/Time   CHOL 147 01/25/2016 1341   CHOL 145 09/01/2015 1101   TRIG 201 (H) 01/25/2016 1341   HDL 56 01/25/2016 1341   LDLCALC 51 01/25/2016 1341     Other studies Reviewed:  EKG:  The ekg from 02/15/2016 was personally reviewed by me and it revealed likely demand  pacemaker with underlying rhythm to be atrial fibrillation. Nonspecific ST-T wave abnormalities. Ventricular rate 64 BPM.  EKG from 03/21/2016 was personally reviewed by me and it revealed atrial fibrillation, ventricular rate of 72 BPM. Nonspecific ST-T wave changes. Aberrantly conducted beats versus PVCs.  Additional studies/ records that were reviewed personally reviewed by me today include:  From [place]: Lexi scan sestamibi study 10/16/2012 revealed LVEF of 50%, fixed inferior wall defect consistent with scar  Echo 11/30/2015: Left ventricle: The cavity size was normal. There was mild  concentric hypertrophy. Systolic function was normal. The  estimated ejection fraction was in the range of 60% to 65%. Wall  motion was normal; there were no regional wall motion  abnormalities. The study is not technically sufficient to allow  evaluation of LV  diastolic function. - Mitral valve: There was mild regurgitation. - Left atrium: The atrium was moderately dilated. - Right ventricle: Pacer wire or catheter noted in right ventricle.  Systolic function was normal. - Tricuspid valve: There was mild-moderate regurgitation. - Pulmonary arteries: Systolic pressure was within the normal  range.   ASSESSMENT AND PLAN:  CAD s/p Xience stent to LAD 12/2007 No chest pain. No ischemia from stress test 2014. Continue medical therapy. Trial of ASA 81mg  qd.   CHF, systolic, based on echocardiogram from April 2017, EF has recovered Ischemic Cardiomyopathy S/p ICD - patient follows up with EP clinic Patient does not appear to be in acute congestive heart failure. Recommend to continue optimal medical therapy: Lasix, potassium, losartan, metoprolol. May take half a tablet of the furosemide 40 mg when necessary for increased swelling. Patient advised to let our office know if she takes this more than 3 times in a week. Discontinued Aldactone on last visit. Daily weights and low sodium diet.  Paroxysmal  atrial fibrillation Ventricular rate is controlled. Patient on Xarelto. Patient tolerating this well. Recommend avoidance of NSAIDs.  Hypertension BP is well controlled. Continue monitoring BP. Continue current medical therapy and lifestyle changes.  Hyperlipidemia  patient on statin therapy, PCP following labs. LDL goal is less than 70.   Abdominal aorta atherosclerosis Incidental finding on lumbar x-ray. Recommend abdominal aorta ultrasound.  Current medicines are reviewed at length with the patient today.  The patient does not have concerns regarding medicines.  Labs/ tests ordered today include:  No orders of the defined types were placed in this encounter.   I had a lengthy and detailed discussion with the patient regarding diagnoses, prognosis, diagnostic options, treatment options , and side effects of medications.   I counseled the patient on importance of lifestyle modification including heart healthy diet, regular physical activity .   Disposition:   FU with undersigned in 6 months   Signed, Barbara Bushy, MD  03/21/2016 11:29 AM    [facility]

## 2016-03-21 NOTE — Patient Instructions (Signed)
Medication Instructions:  Your physician has recommended you make the following change in your medication:  1. Start Aspirin 81 mg every other day and then increase to once daily if tolerated 2. Take extra dose of lasix for swelling. If you take more than 3 in one week please call our office.   Testing/Procedures: Your physician has requested that you have an abdominal aorta duplex. During this test, an ultrasound is used to evaluate the aorta. Allow 30 minutes for this exam. Do not eat after midnight the day before and avoid carbonated beverages   Follow-Up: Your physician wants you to follow-up in: 6 months with Dr. Yvone Neu. You will receive a reminder letter in the mail two months in advance. If you don't receive a letter, please call our office to schedule the follow-up appointment.  It was a pleasure seeing you today here in the office. Please do not hesitate to give [place] a call back if you have any further questions. [place], BSN     If you need a refill on your cardiac medications before your next appointment, please call your pharmacy.

## 2016-03-31 ENCOUNTER — Other Ambulatory Visit: Payer: Self-pay | Admitting: Cardiology

## 2016-03-31 ENCOUNTER — Other Ambulatory Visit: Payer: Self-pay | Admitting: Internal Medicine

## 2016-03-31 ENCOUNTER — Other Ambulatory Visit: Payer: Self-pay

## 2016-03-31 DIAGNOSIS — I2510 Atherosclerotic heart disease of native coronary artery without angina pectoris: Secondary | ICD-10-CM

## 2016-03-31 DIAGNOSIS — Z8249 Family history of ischemic heart disease and other diseases of the circulatory system: Secondary | ICD-10-CM

## 2016-03-31 DIAGNOSIS — I700 Atherosclerosis of aorta: Secondary | ICD-10-CM

## 2016-03-31 MED ORDER — AMLODIPINE BESYLATE 5 MG PO TABS: ORAL_TABLET | ORAL | 11 refills | Status: DC

## 2016-04-03 ENCOUNTER — Telehealth: Payer: Self-pay

## 2016-04-03 DIAGNOSIS — M71341 Other bursal cyst, right hand: Secondary | ICD-10-CM | POA: Diagnosis not present

## 2016-04-03 DIAGNOSIS — Z08 Encounter for follow-up examination after completed treatment for malignant neoplasm: Secondary | ICD-10-CM | POA: Diagnosis not present

## 2016-04-03 DIAGNOSIS — L570 Actinic keratosis: Secondary | ICD-10-CM | POA: Diagnosis not present

## 2016-04-03 DIAGNOSIS — C44329 Squamous cell carcinoma of skin of other parts of face: Secondary | ICD-10-CM | POA: Diagnosis not present

## 2016-04-03 DIAGNOSIS — Z1283 Encounter for screening for malignant neoplasm of skin: Secondary | ICD-10-CM | POA: Diagnosis not present

## 2016-04-03 NOTE — Telephone Encounter (Signed)
Left message on the patient's answering machine the Xarelto 20 mg has been approved through 08/27/2016. [facility] in [place] also notified of the approval of Xarelto.

## 2016-04-05 ENCOUNTER — Other Ambulatory Visit: Payer: Medicare Other

## 2016-04-06 ENCOUNTER — Other Ambulatory Visit: Payer: Medicare Other

## 2016-04-17 ENCOUNTER — Encounter: Payer: Self-pay | Admitting: *Deleted

## 2016-04-17 ENCOUNTER — Other Ambulatory Visit: Payer: Self-pay | Admitting: *Deleted

## 2016-04-17 NOTE — Patient Outreach (Addendum)
[facility]) Care Management   04/17/2016  Barbara Blackburn 21-Jul-1929 [id number]  Barbara Blackburn is an 82 y.o. female  Subjective:  Patient discussed that a few weeks ago she  pulled groin muscle while getting out of car. Patient states that it is gradually getting better, and she takes tylenol daily.  Patient states she is tired of going to doctors.  Patient reports having a fall as her toe got caught on the rug going into her daughters home, and " I went down on my bottom". Denies injury from fall.   Barbara Blackburn reports weighing on most days, no increase in weights. Patient reports swelling in her legs because its harder for her to lift up bottom of recliner and manually  put it back down due to discomfort in groin.  Barbara Blackburn voiced being a little nervous about the ultrasound to evaluate her for possible aneurysm , she  discussed her sister had a aneurysm and chose not to have surgery.     Objective:  BP 132/74   Pulse 66   Resp 18   Wt 176 lb (79.8 kg)   LMP  (LMP Unknown)   BMI 33.25 kg/m   Patient resting in recliner chair, able to ambulate on level service in her home.  Review of Systems  Constitutional: Negative.   HENT: Negative.   Eyes: Negative.   Respiratory: Negative.   Cardiovascular: Negative.   Gastrointestinal: Negative.   Genitourinary: Negative.   Musculoskeletal:       Right groin pain  Skin: Negative.   Neurological: Negative.   Endo/Heme/Allergies: Negative.   Psychiatric/Behavioral: Negative.        Voiced being nervous about results of upcoming ultrasound to evaluate for possible aneurysm    Physical Exam  Constitutional: She is oriented to person, place, and time. She appears well-developed and well-nourished.  Eyes: Pupils are equal, round, and reactive to light.  Cardiovascular: Normal rate, normal heart sounds and intact distal pulses.   Respiratory: Effort normal.  GI: Soft.  Neurological: She is alert and oriented  to person, place, and time.  Skin: Skin is warm and dry.  Basal cell lesions on face, left arm  Psychiatric: She has a normal mood and affect. Her behavior is normal. Judgment and thought content normal.    Encounter Medications:   Outpatient Encounter Prescriptions as of 04/17/2016  Medication Sig Note  . acetaminophen (TYLENOL) 500 MG tablet Take 500 mg by mouth every 6 (six) hours as needed.   [name] amLODipine (NORVASC) 5 MG tablet TAKE 1 TABLET (5 MG TOTAL) BY MOUTH DAILY.   [name] aspirin EC 81 MG tablet Take 1 tablet (81 mg total) by mouth daily.   . CRESTOR 20 MG tablet TAKE 1/2 TAB DAILY   . fluorouracil (EFUDEX) 5 % cream Apply topically once a week. To basal cell lesion on face   . furosemide (LASIX) 40 MG tablet Take 40 mg by mouth daily.    [name] losartan (COZAAR) 50 MG tablet TAKE 1 TABLET BY MOUTH EVERY DAY   . metoprolol succinate (TOPROL-XL) 50 MG 24 hr tablet Take 1 tablet (50 mg total) by mouth daily. Take with or immediately following a meal.   . potassium bicarbonate (KLOR-CON/EF) 25 MEQ disintegrating tablet Take 1 tablet (25 mEq total) by mouth daily. (Patient taking differently: Take 12.5 mEq by mouth daily. )   . Probiotic Product (ALIGN) 4 MG CAPS Take by mouth daily.   . rivaroxaban (XARELTO) 20 MG TABS tablet  Take 1 tablet (20 mg total) by mouth daily with supper.   [name] albuterol (PROVENTIL) (2.5 MG/3ML) 0.083% nebulizer solution Take 3 mLs (2.5 mg total) by nebulization every 4 (four) hours as needed for wheezing or shortness of breath. (Patient not taking: Reported on 04/17/2016)   . ibuprofen (ADVIL,MOTRIN) 800 MG tablet Take 800 mg by mouth every 8 (eight) hours as needed. Reported on 03/13/2016 04/17/2016: Not taking  . lidocaine-prilocaine (EMLA) cream Apply 1 application topically as needed. To left shoulder    No facility-administered encounter medications on file as of 04/17/2016.     Functional Status:   In your present state of health, do you have any difficulty  performing the following activities: 03/03/2016 02/14/2016  Hearing? Tempie Donning  Vision? N N  Difficulty concentrating or making decisions? Tempie Donning  Walking or climbing stairs? Y Y  Dressing or bathing? Y N  Doing errands, shopping? Tempie Donning  Preparing Food and eating ? - Y  Using the Toilet? - N  In the past six months, have you accidently leaked urine? - Y  Do you have problems with loss of bowel control? - Y  Managing your Medications? - Y  Managing your Finances? - Y  Housekeeping or managing your Housekeeping? - Y  Some recent data might be hidden    Fall/Depression Screening:    PHQ 2/9 Scores 03/13/2016 03/03/2016 02/14/2016 05/24/2015 05/24/2015 02/15/2015  PHQ - 2 Score _0 0  PHQ- 9 Score - - 9 - 6 -    Assessment:  Routine home visit, her daughter Barbara Blackburn is present.  Right Groin discomfort  -  Patient has declined PCP visit regarding groin discomfort , wants to wait until after her Abdominal ultra sound. Patient 's daughter to continue to encourage, I have reinforced importance of MD follow up.  Fall Risk- Reviewed fall prevention measures, rug has been removed at daughters home. Reviewed fall prevention measures. Difficult for patient to go down stairs to garage to do her normal walking plan.   Heart Failure- weights have remain stable, patient states she limits her salt. Patient attributes swelling in her legs to not being able to elevate them as much due to problems with letting the recliner chair up and down. Patient's daughter is looking into getting a lift chair.   Reassurance to patient regarding her upcoming test, encouraged patient and daughter to call for concerns or worsening symptoms.  Plan:  Will follow up with patient by phone in 2 weeks, and scheduled home visit for September. Will update patient centered care goals.  THN CM Care Plan Problem One   Flowsheet Row Most Recent Value  Care Plan Problem One  Knowledge Deficit related to heart failure  Role Documenting the  Problem One  Care Management Coordinator  Care Plan for Problem One  Active  THN Long Term Goal (31-90 days)  Patient will verbalize increased knowledge related to Heart Failure education and management strategies in the next 90 days  THN Long Term Goal Start Date  02/14/16  Interventions for Problem One Long Term Goal  Encouraged to continue to weigh daily and notify MD of continued weight gains or swelling in legs. , follow protocol for adminstrating extra lasix per MD ordres.   [facility]N CM Short Term Goal #1 (0-30 days)  Patient report weighing  at least 3 days weekly in the next 31 days [goal adjusted]  THN CM Short Term Goal #1 Start Date  03/13/16  [facility] CM  Short Term Goal #1 Met Date  04/17/16  [facility]N CM Short Term Goal #2 (0-30 days)  Patient/caregiver will report monitoring blood pressure at least weekly in the next 30 days  THN CM Short Term Goal #2 Start Date  03/13/16  [facility] CM Short Term Goal #2 Met Date  04/17/16  [facility]N CM Short Term Goal #3 (0-30 days)  Patient will be able to state at least 2 symptoms of heart failure and action to take in the next 30 days  THN CM Short Term Goal #3 Start Date  03/13/16  [facility] CM Short Term Goal #3 Met Date  04/17/16    [facility] CM Care Plan Problem Two   Flowsheet Row Most Recent Value  Care Plan Problem Two  fall prevention   Role Documenting the Problem Two  Care Management Coordinator  Care Plan for Problem Two  Active  Interventions for Problem Two Long Term Goal   Reinforced with patient continuing to walk in home as tolerated and notify MD if unable to tolerate   THN Long Term Goal (31-90) days  Patient will report increased knowledge of fall prevention strategies in the next 31 days  THN Long Term Goal Start Date  04/17/16 Billy Fischer goal date]  THN CM Short Term Goal #1 (0-30 days)  Patient walk in home at least 2 days per week in the next 30 days  THN CM Short Term Goal #1 Start Date  02/14/16  [facility] CM Short Term Goal #1 Met Date   03/13/16  THN CM Short  Term Goal #2 (0-30 days)  Patient will report not having a fall in the next 30 days  THN CM Short Term Goal #2 Start Date  04/17/16 [goal date restarted]  Interventions for Short Term Goal #2  Reinforced fall prevention measures and notifying MD of falls occurences to evaluate if concern related to injury       [name], RN, [facility] [phone]- Mobile [phone]- [place]

## 2016-04-21 ENCOUNTER — Ambulatory Visit
Admission: RE | Admit: 2016-04-21 | Discharge: 2016-04-21 | Disposition: A | Discharge status: Home / Self Care | Payer: Medicare Other | Source: Ambulatory Visit | Attending: Cardiovascular Disease | Admitting: Cardiovascular Disease

## 2016-04-21 ENCOUNTER — Other Ambulatory Visit: Payer: Self-pay

## 2016-04-21 ENCOUNTER — Ambulatory Visit: Payer: Medicare Other

## 2016-04-21 ENCOUNTER — Other Ambulatory Visit (INDEPENDENT_AMBULATORY_CARE_PROVIDER_SITE_OTHER): Payer: Medicare Other

## 2016-04-21 DIAGNOSIS — N2889 Other specified disorders of kidney and ureter: Secondary | ICD-10-CM | POA: Insufficient documentation

## 2016-04-21 DIAGNOSIS — I700 Atherosclerosis of aorta: Secondary | ICD-10-CM | POA: Diagnosis not present

## 2016-04-21 DIAGNOSIS — I4891 Unspecified atrial fibrillation: Secondary | ICD-10-CM | POA: Diagnosis not present

## 2016-04-22 LAB — SPECIMEN STATUS

## 2016-04-24 ENCOUNTER — Ambulatory Visit
Admission: RE | Admit: 2016-04-24 | Discharge: 2016-04-24 | Disposition: A | Discharge status: Home / Self Care | Payer: Medicare Other | Source: Ambulatory Visit | Attending: Unknown Physician Specialty | Admitting: Unknown Physician Specialty

## 2016-04-24 ENCOUNTER — Telehealth: Payer: Self-pay | Admitting: Unknown Physician Specialty

## 2016-04-24 ENCOUNTER — Telehealth: Payer: Self-pay

## 2016-04-24 ENCOUNTER — Encounter: Payer: Self-pay | Admitting: Unknown Physician Specialty

## 2016-04-24 ENCOUNTER — Ambulatory Visit (INDEPENDENT_AMBULATORY_CARE_PROVIDER_SITE_OTHER): Payer: Medicare Other | Admitting: Unknown Physician Specialty

## 2016-04-24 VITALS — BP 133/82 | HR 76 | Temp 97.9°F | Ht 61.7 in | Wt 180.0 lb

## 2016-04-24 DIAGNOSIS — R1030 Lower abdominal pain, unspecified: Secondary | ICD-10-CM

## 2016-04-24 DIAGNOSIS — M160 Bilateral primary osteoarthritis of hip: Secondary | ICD-10-CM | POA: Insufficient documentation

## 2016-04-24 DIAGNOSIS — M25551 Pain in right hip: Secondary | ICD-10-CM | POA: Diagnosis present

## 2016-04-24 DIAGNOSIS — R1031 Right lower quadrant pain: Secondary | ICD-10-CM

## 2016-04-24 DIAGNOSIS — M1611 Unilateral primary osteoarthritis, right hip: Secondary | ICD-10-CM | POA: Diagnosis not present

## 2016-04-24 DIAGNOSIS — S329XXA Fracture of unspecified parts of lumbosacral spine and pelvis, initial encounter for closed fracture: Secondary | ICD-10-CM

## 2016-04-24 MED ORDER — MELOXICAM 15 MG PO TABS: 15.0000 mg | ORAL_TABLET | Freq: Every day | ORAL | 1 refills | Status: DC

## 2016-04-24 NOTE — Progress Notes (Signed)
BP 133/82 (BP Location: Left Arm, Cuff Size: Large)   Pulse 76   Temp 97.9 F (36.6 C)   Ht 5' 1.7" (1.567 m) Comment: pt had shoes on  Wt 180 lb (81.6 kg) Comment: pt had shoes on  LMP  (LMP Unknown)   SpO2 97%   BMI 33.24 kg/m    Subjective:    Patient ID: Barbara Blackburn, female    DOB: 1928-11-29, 82 y.o.   MRN: [id number]  HPI: Barbara Blackburn is a 82 y.o. female  Chief Complaint  Patient presents with  . Groin Pain    pt states Barbara has been having pain in right groin area for about a month now, states Barbara felt the pain getting out of her car one day    States Barbara has an "injured groin."  States Barbara felt it one day when Barbara got out of the car.  No masses.  States it bothers her most when Barbara gets up in the AM.  Uses heat and walks around which helps.  Someone needs to lift her leg for her.  Tylenol helps  Relevant past medical, surgical, family and social history reviewed and updated as indicated. Interim medical history since our last visit reviewed. Allergies and medications reviewed and updated.  Review of Systems  Per HPI unless specifically indicated above     Objective:    BP 133/82 (BP Location: Left Arm, Cuff Size: Large)   Pulse 76   Temp 97.9 F (36.6 C)   Ht 5' 1.7" (1.567 m) Comment: pt had shoes on  Wt 180 lb (81.6 kg) Comment: pt had shoes on  LMP  (LMP Unknown)   SpO2 97%   BMI 33.24 kg/m   Wt Readings from Last 3 Encounters:  04/24/16 180 lb (81.6 kg)  04/17/16 176 lb (79.8 kg)  03/21/16 176 lb (79.8 kg)    Physical Exam  Constitutional: Barbara is oriented to person, place, and time. Barbara appears well-developed and well-nourished. No distress.  HENT:  Head: Normocephalic and atraumatic.  Eyes: Conjunctivae and lids are normal. Right eye exhibits no discharge. Left eye exhibits no discharge. No scleral icterus.  Cardiovascular: Normal rate.   Pulmonary/Chest: Effort normal.  Abdominal: Normal appearance. There is no splenomegaly or  hepatomegaly.  Musculoskeletal: Normal range of motion.  Unable to assess groin due to immobility  Neurological: Barbara is alert and oriented to person, place, and time.  Skin: Skin is intact. No rash noted. No pallor.  Psychiatric: Barbara has a normal mood and affect. Her behavior is normal. Judgment and thought content normal.    Results for orders placed or performed in visit on 04/21/16  CBC  Result Value Ref Range   WBC 8.7 3.4 - 10.8 x10E3/uL   RBC 5.07 3.77 - 5.28 x10E6/uL   Hemoglobin 16.0 (H) 11.1 - 15.9 g/dL   Hematocrit 47.2 (H) 34.0 - 46.6 %   MCV 93 79 - 97 fL   MCH 31.6 26.6 - 33.0 pg   MCHC 33.9 31.5 - 35.7 g/dL   RDW 14.1 12.3 - 15.4 %   Platelets 184 150 - 379 [id number]  Basic Metabolic Panel (BMET)  Result Value Ref Range   Glucose 102 (H) 65 - 99 mg/dL   BUN 18 8 - 27 mg/dL   Creatinine, Ser 0.79 0.57 - 1.00 mg/dL   GFR calc non Af Amer 69 >59 mL/min/1.73   GFR calc Af Amer 79 >59 mL/min/1.73   BUN/Creatinine Ratio 23  12 - 28   Sodium 142 134 - 144 mmol/L   Potassium 4.6 3.5 - 5.2 mmol/L   Chloride 101 96 - 106 mmol/L   CO2 22 18 - 29 mmol/L   Calcium 9.6 8.7 - 10.3 mg/dL  Specimen Status  Result Value Ref Range   INR WILL FOLLOW    Prothrombin Time WILL FOLLOW    aPTT WILL FOLLOW       Assessment & Plan:   Problem List Items Addressed This Visit    None    Visit Diagnoses    Right groin pain    -  Primary   Relevant Orders   DG HIP UNILAT WITH PELVIS 2-3 VIEWS RIGHT       Follow up plan: Return if symptoms worsen or fail to improve, for and x-ray results.

## 2016-04-24 NOTE — Telephone Encounter (Signed)
Discussed with pt about her x-ray.  Will refer to orthopedics

## 2016-04-24 NOTE — Telephone Encounter (Signed)
[facility] called to give results on patient's x-ray. They stated that it was a possible non-displaced parasymphyseal fracture in right pubic bone. Further evaluation can be done with non contrast CT or MRI of pelvis. Stated patient also has bilateral hip joint osteoarthritis. They also stated that the results are in the chart.

## 2016-04-25 LAB — SPECIMEN STATUS REPORT

## 2016-04-25 LAB — CBC
HEMATOCRIT: 47.2 % — AB (ref 34.0–46.6)
HEMOGLOBIN: 16 g/dL — AB (ref 11.1–15.9)
MCH: 31.6 pg (ref 26.6–33.0)
MCHC: 33.9 g/dL (ref 31.5–35.7)
MCV: 93 fL (ref 79–97)
Platelets: 184 10*3/uL (ref 150–379)
RBC: 5.07 x10E6/uL (ref 3.77–5.28)
RDW: 14.1 % (ref 12.3–15.4)
WBC: 8.7 10*3/uL (ref 3.4–10.8)

## 2016-04-25 LAB — BASIC METABOLIC PANEL
BUN/Creatinine Ratio: 23 (ref 12–28)
BUN: 18 mg/dL (ref 8–27)
CALCIUM: 9.6 mg/dL (ref 8.7–10.3)
CO2: 22 mmol/L (ref 18–29)
CREATININE: 0.79 mg/dL (ref 0.57–1.00)
Chloride: 101 mmol/L (ref 96–106)
GFR calc Af Amer: 79 mL/min/{1.73_m2} (ref >59)
GFR, EST NON AFRICAN AMERICAN: 69 mL/min/{1.73_m2} (ref >59)
GLUCOSE: 102 mg/dL — AB (ref 65–99)
POTASSIUM: 4.6 mmol/L (ref 3.5–5.2)
Sodium: 142 mmol/L (ref 134–144)

## 2016-04-26 DIAGNOSIS — M1611 Unilateral primary osteoarthritis, right hip: Secondary | ICD-10-CM | POA: Diagnosis not present

## 2016-04-29 ENCOUNTER — Other Ambulatory Visit: Payer: Self-pay | Admitting: Internal Medicine

## 2016-05-03 ENCOUNTER — Other Ambulatory Visit: Payer: Self-pay | Admitting: *Deleted

## 2016-05-03 NOTE — Patient Outreach (Signed)
[facility]) Care Management  05/03/2016  Barbara Blackburn 19-Sep-1928 [id number]   Telephone assessment  Unsuccessful call to Barbara Blackburn , I was able to leave a HIPAA compliant message with my return contact number.   Plan Will await return call, if no response will place on schedule for within a week.   [name], RN, [place] Management [phone]- Mobile [phone]- Toll Free Main Office

## 2016-05-04 ENCOUNTER — Other Ambulatory Visit: Payer: Self-pay | Admitting: *Deleted

## 2016-05-04 NOTE — Patient Outreach (Addendum)
[facility]) Care Management  05/04/2016  JACKALINE SCONCE [birth year] [id number]   Return Incoming call from Mrs.Karis's daughter, [name], she reports that the patient is doing much better, some improvement with mobility, improved discomfort at right groin area, states she was recently started on meloxicam,for arthritis. [name] reports patient recent visit to PCP and  to orthopedic doctor, to follow up on right groin area discomfort with  xray's, negative for fracture.  [name] discussed that patient has completed Ultrasound to rule to out AAA, but she has not received  results, encouraged to call PCP office to follow up on results.  Mrs.Rothschild is currently still being followed by personal care service provider. [name] denies new concerns at this time.  Plan Will follow up with patient at home visit in the next 2 weeks.   [name], RN, [place] Management [phone]- Mobile [phone]- Toll Free Main Office

## 2016-05-08 ENCOUNTER — Telehealth: Payer: Self-pay | Admitting: Unknown Physician Specialty

## 2016-05-08 ENCOUNTER — Ambulatory Visit: Payer: Self-pay | Admitting: *Deleted

## 2016-05-08 MED ORDER — MELOXICAM 15 MG PO TABS: 15.0000 mg | ORAL_TABLET | Freq: Every day | ORAL | 1 refills | Status: DC

## 2016-05-08 NOTE — Telephone Encounter (Signed)
Rx sent to her pharmacy 

## 2016-05-08 NOTE — Telephone Encounter (Signed)
Called and spoke to patient's daughter. She states that the patient was seen by Ellard Artis, PA at [facility] on 04/26/16 for her hip. Stated that it was not broken but that the patient does have arthritis. Patient has been taking the 15 mg of meloxicam and it has been helping a lot. Daughter states that they were told at [facility] that the patient could take 30 mg of meloxicam for 2 months. They would like a rx sent in for this if possible. Daughter advised that Malachy Mood was out of the office until Friday. Daughter stated that they have enough medication to get through Sunday so if this needs to wait for Malachy Mood, then that would be OK.

## 2016-05-09 ENCOUNTER — Ambulatory Visit (INDEPENDENT_AMBULATORY_CARE_PROVIDER_SITE_OTHER): Payer: Medicare Other | Admitting: *Deleted

## 2016-05-09 DIAGNOSIS — I4891 Unspecified atrial fibrillation: Secondary | ICD-10-CM

## 2016-05-09 DIAGNOSIS — I509 Heart failure, unspecified: Secondary | ICD-10-CM

## 2016-05-09 DIAGNOSIS — Z95810 Presence of automatic (implantable) cardiac defibrillator: Secondary | ICD-10-CM

## 2016-05-09 NOTE — Progress Notes (Signed)
Remote ICD transmission.   

## 2016-05-11 ENCOUNTER — Encounter: Payer: Self-pay | Admitting: Cardiology

## 2016-05-15 ENCOUNTER — Encounter: Payer: Self-pay | Admitting: *Deleted

## 2016-05-15 ENCOUNTER — Other Ambulatory Visit: Payer: Self-pay | Admitting: *Deleted

## 2016-05-15 VITALS — BP 124/68 | HR 63 | Resp 18

## 2016-05-15 DIAGNOSIS — I5022 Chronic systolic (congestive) heart failure: Secondary | ICD-10-CM

## 2016-05-15 DIAGNOSIS — I482 Chronic atrial fibrillation, unspecified: Secondary | ICD-10-CM

## 2016-05-15 NOTE — Patient Outreach (Addendum)
[facility]) Care Management   05/15/2016  Barbara Blackburn [birth year] [id number]  Barbara Blackburn is an 82 y.o. female  Subjective:  I am doing much better, patient relieved after having follow up appointment about her right groin discomfort and not having  fracture just arthritis . Patient reports improvement of discomfort. Barbara Blackburn voiced relief after having her scan done also to rule out aneurysm.  Barbara Blackburn reports improvement in swelling in her legs since she has the new chair she can keep her legs elevated. Denies increase in weight and not shortness of breath. Patient states she continues to check her blood pressure about every week and is not weighing on a regular basis, but she is taking her medications as prescribed.   Barbara Blackburn denies having a fall, she states she has been able to get back to walking in the garage area as she was doing earlier. Barbara Blackburn's daughter [name] discussed that the personal care provider services they had hired  would end this week and they will only use help on a as needed basis.     Objective:  BP 124/68 (BP Location: Left Arm, Patient Position: Sitting)   Pulse 63   Resp 18   LMP  (LMP Unknown)   SpO2 98%  Patient sitting in her new lift chair, demonstrating how it works. Patient has cane and all frequently used items next on table next to chair.  Review of Systems  Constitutional: Negative.   HENT: Negative.   Eyes: Negative.   Respiratory: Negative.   Cardiovascular: Negative.   Gastrointestinal: Negative.   Genitourinary: Negative.   Musculoskeletal: Negative.   Skin: Negative.   Neurological: Negative.   Endo/Heme/Allergies: Bruises/bleeds easily.  Psychiatric/Behavioral: Negative.     Physical Exam  Constitutional: She is oriented to person, place, and time. She appears well-developed and well-nourished.  Cardiovascular: Normal rate and normal heart sounds.   Respiratory: Effort normal.  GI: Soft.   Neurological: She is alert and oriented to person, place, and time.  Skin: Skin is warm and dry.  Psychiatric: She has a normal mood and affect. Her behavior is normal. Judgment and thought content normal.    Encounter Medications:   Outpatient Encounter Prescriptions as of 05/15/2016  Medication Sig  . acetaminophen (TYLENOL) 500 MG tablet Take 500 mg by mouth every 6 (six) hours as needed.  [name] amLODipine (NORVASC) 5 MG tablet TAKE 1 TABLET (5 MG TOTAL) BY MOUTH DAILY.  [name] aspirin EC 81 MG tablet Take 1 tablet (81 mg total) by mouth daily.  . CRESTOR 20 MG tablet TAKE 1/2 TAB DAILY  . fluorouracil (EFUDEX) 5 % cream Apply topically once a week. To basal cell lesion on face  . furosemide (LASIX) 40 MG tablet Take 40 mg by mouth daily.   [name] losartan (COZAAR) 50 MG tablet TAKE 1 TABLET BY MOUTH EVERY DAY  . meloxicam (MOBIC) 15 MG tablet Take 1 tablet (15 mg total) by mouth daily.  . metoprolol succinate (TOPROL-XL) 50 MG 24 hr tablet Take 1 tablet (50 mg total) by mouth daily. Take with or immediately following a meal.  . potassium bicarbonate (KLOR-CON/EF) 25 MEQ disintegrating tablet Take 1 tablet (25 mEq total) by mouth daily. (Patient taking differently: Take 12.5 mEq by mouth daily. )  . Probiotic Product (ALIGN) 4 MG CAPS Take by mouth daily.  . rivaroxaban (XARELTO) 20 MG TABS tablet Take 1 tablet (20 mg total) by mouth daily with supper.  . lidocaine-prilocaine (EMLA) cream Apply 1 application  topically as needed. To left shoulder   No facility-administered encounter medications on file as of 05/15/2016.     Functional Status:   In your present state of health, do you have any difficulty performing the following activities: 03/03/2016 02/14/2016  Hearing? Barbara Blackburn  Vision? N N  Difficulty concentrating or making decisions? Barbara Blackburn  Walking or climbing stairs? Y Y  Dressing or bathing? Y N  Doing errands, shopping? Barbara Blackburn  Preparing Food and eating ? - Y  Using the Toilet? - N  In the past six  months, have you accidently leaked urine? - Y  Do you have problems with loss of bowel control? - Y  Managing your Medications? - Y  Managing your Finances? - Y  Housekeeping or managing your Housekeeping? - Y  Some recent data might be hidden    Fall/Depression Screening:    PHQ 2/9 Scores 03/13/2016 03/03/2016 02/14/2016 05/24/2015 05/24/2015 02/15/2015  PHQ - 2 Score '1 1 4 2 2 ' 0  PHQ- 9 Score - - 9 - 6 -   Fall Risk  03/13/2016 03/03/2016 02/14/2016 01/27/2016 01/27/2016  Falls in the past year? Yes No Yes (No Data) Yes  Number falls in past yr: 1 - 1 (No Data) 1  Injury with Fall? Yes - Yes - Yes  Risk Factor Category  High Fall Risk - High Fall Risk - -  Risk for fall due to : History of fall(s) - History of fall(s) - History of fall(s)  Follow up Falls prevention discussed - Falls evaluation completed;Falls prevention discussed;Education provided - Falls prevention discussed;Education provided   Assessment:  Routine home visit  Heart Failure - stable , no swelling, no increased symptoms. Patient agreeable to starting back to weighing on a regular basis.   Hypertension - Patient watches her salt, understands importance of monitoring her blood pressure.   Fall Risk - Patient is a fall risk, she has started back on daily walking exercise and uses her cane.   Patient has not experienced a hospital admission in the last 3 months, she has consistent MD follow up, she has made progress with home health therapy, increased strength and balance and improved pain control.  Community care coordinator goals have been met, patient and her daughter [name] interested and will benefit from  the health coach program for continued education and management to heart failure.  Patient has contact numbers if community care needs arise.  Plan:  Will close case to community care coordinator, and transition to health coach.   [name], RN, [place] Management [phone]- Mobile [phone]- Toll  Free Main Office

## 2016-05-18 LAB — CUP PACEART REMOTE DEVICE CHECK
Battery Voltage: 2.63 V
Brady Statistic AP VP Percent: 0 %
Brady Statistic AP VS Percent: 0 %
Brady Statistic AS VP Percent: 28.44 %
Brady Statistic AS VS Percent: 71.56 %
Date Time Interrogation Session: 20170912041807
HIGH POWER IMPEDANCE MEASURED VALUE: 418 Ohm
HIGH POWER IMPEDANCE MEASURED VALUE: 44 Ohm
HighPow Impedance: 56 Ohm
Implantable Lead Implant Date: 20070511
Implantable Lead Location: 753860
Implantable Lead Model: 5076
Implantable Lead Model: 6949
Lead Channel Impedance Value: 399 Ohm
Lead Channel Pacing Threshold Pulse Width: 0.4 ms
Lead Channel Sensing Intrinsic Amplitude: 0.375 mV
Lead Channel Sensing Intrinsic Amplitude: 0.375 mV
Lead Channel Sensing Intrinsic Amplitude: 1.375 mV
Lead Channel Setting Sensing Sensitivity: 0.3 mV
MDC IDC LEAD IMPLANT DT: 20070511
MDC IDC LEAD IMPLANT DT: 20070511
MDC IDC LEAD LOCATION: 753859
MDC IDC LEAD LOCATION: 753860
MDC IDC MSMT LEADCHNL RV IMPEDANCE VALUE: 532 Ohm
MDC IDC MSMT LEADCHNL RV PACING THRESHOLD AMPLITUDE: 0.75 V
MDC IDC MSMT LEADCHNL RV SENSING INTR AMPL: 1.375 mV
MDC IDC SET LEADCHNL RV PACING AMPLITUDE: 2.5 V
MDC IDC SET LEADCHNL RV PACING PULSEWIDTH: 0.4 ms
MDC IDC STAT BRADY RA PERCENT PACED: 0 %
MDC IDC STAT BRADY RV PERCENT PACED: 28.44 %

## 2016-05-19 ENCOUNTER — Encounter: Payer: Self-pay | Admitting: *Deleted

## 2016-05-31 ENCOUNTER — Ambulatory Visit (INDEPENDENT_AMBULATORY_CARE_PROVIDER_SITE_OTHER): Payer: Medicare Other

## 2016-05-31 DIAGNOSIS — Z23 Encounter for immunization: Secondary | ICD-10-CM

## 2016-06-13 ENCOUNTER — Encounter: Payer: Self-pay | Admitting: *Deleted

## 2016-06-13 ENCOUNTER — Other Ambulatory Visit: Payer: Self-pay | Admitting: *Deleted

## 2016-06-13 NOTE — Patient Outreach (Signed)
[facility]) Care Management  06/13/2016   Barbara Blackburn 1929/01/09 [id number]  Subjective: RN Health Coach telephone call to patient.  Hipaa compliance verified. Per patient her weight yesterday was 178.5 pounds. Per patient she weighs every other day. Patient is not having swelling and is in the green zone. Per patient she is taking her medications as prescribed. Per patient her daughter fixes her pill boxes. Patient does use a walker when ambulating and is able to do most of her self care. Patient states she walks for about 15 minutes each day. Patient does have a emergency button if needed that she wears all the time. Per patient she has a history of mini strokes.  Patient stated she had fallen once within the past year but per patient her legs are weak and she just slid down with no injuries. Patient does wear bilateral hearing aids.  Patient has agreed to follow up outreach calls.   Objective:   Current Medications:  Current Outpatient Prescriptions  Medication Sig Dispense Refill  . acetaminophen (TYLENOL) 500 MG tablet Take 500 mg by mouth every 6 (six) hours as needed.    [name] amLODipine (NORVASC) 5 MG tablet TAKE 1 TABLET (5 MG TOTAL) BY MOUTH DAILY. 30 tablet 11  . aspirin EC 81 MG tablet Take 1 tablet (81 mg total) by mouth daily. 90 tablet 3  . CRESTOR 20 MG tablet TAKE 1/2 TAB DAILY 30 tablet 5  . fluorouracil (EFUDEX) 5 % cream Apply topically once a week. To basal cell lesion on face    . furosemide (LASIX) 40 MG tablet Take 40 mg by mouth daily.     [name] lidocaine-prilocaine (EMLA) cream Apply 1 application topically as needed. To left shoulder    . losartan (COZAAR) 50 MG tablet TAKE 1 TABLET BY MOUTH EVERY DAY 30 tablet 6  . meloxicam (MOBIC) 15 MG tablet Take 1 tablet (15 mg total) by mouth daily. 60 tablet 1  . metoprolol succinate (TOPROL-XL) 50 MG 24 hr tablet Take 1 tablet (50 mg total) by mouth daily. Take with or immediately following a meal. 30  tablet 11  . potassium bicarbonate (KLOR-CON/EF) 25 MEQ disintegrating tablet Take 1 tablet (25 mEq total) by mouth daily. (Patient taking differently: Take 12.5 mEq by mouth daily. ) 90 tablet 3  . Probiotic Product (ALIGN) 4 MG CAPS Take by mouth daily.    . rivaroxaban (XARELTO) 20 MG TABS tablet Take 1 tablet (20 mg total) by mouth daily with supper. 30 tablet 6   No current facility-administered medications for this visit.     Functional Status:  In your present state of health, do you have any difficulty performing the following activities: 06/13/2016 03/03/2016  Hearing? Tempie Donning  Vision? N N  Difficulty concentrating or making decisions? N Y  Walking or climbing stairs? Y Y  Dressing or bathing? N Y  Doing errands, shopping? Tempie Donning  Preparing Food and eating ? Y -  Using the Toilet? N -  In the past six months, have you accidently leaked urine? N -  Do you have problems with loss of bowel control? N -  Managing your Medications? Y -  Managing your Finances? N -  Housekeeping or managing your Housekeeping? N -  Some recent data might be hidden    Fall/Depression Screening: PHQ 2/9 Scores 06/13/2016 03/13/2016 03/03/2016 02/14/2016 05/24/2015 05/24/2015 02/15/2015  PHQ - 2 Score 1 1 1 4 2 2  0  PHQ- 9 Score - - -  9 - 6 -   THN CM Care Plan Problem One   Flowsheet Row Most Recent Value  Care Plan Problem One  Knowledge Deficit in Self Management of Congestive Heart Failure  Role Documenting the Problem One  [place] for Problem One  Active  THN Long Term Goal (31-90 days)  Patient will have any readmissions for Congestive Hweart Failure within the next 90 days  THN Long Term Goal Start Date  06/13/16  Interventions for Problem One Long Term Goal  RN reminded patient to keep appointments with primary medical Dr and cardiologist. RN reminded patient the importance of taking medication as per physician order. RN will follow up patient monthly  THN CM Short Term Goal #1 (0-30  days)  Patient will report weighing every other day and documenting  THN CM Short Term Goal #1 Start Date  06/13/16  Interventions for Short Term Goal #1  RN sent educational information on living better with heart RN sent calendar book for documentation.RN will follow up with discussion teach back  THN CM Short Term Goal #2 (0-30 days)  Patient will be able to recognize symptoms of peripheral edma within the next 30 days  THN CM Short Term Goal #2 Start Date  06/13/16  Interventions for Short Term Goal #2  RN sent picture chart with educational material on peripheral edema. RN will follow up with discussion and teach back.      Assessment:  Patient weighs every day Patient has a very good support system Patient will benefit from [facility] telephonic outreach for education and support for Congestive Heart failure self management.  Plan:  RN will sent a living  better with heart failure booklet RN sent a picture chart of peripheral edema and educational material RN sent additional information on CHF Zones and action plan  RN sent EMMI on Heart Failure when to call 911 RN sent EMMI on What to do to prevent a second stroke. RN will follow up with discussion and teach back within the month on November  Barbara Blackburn [place] Management [phone]

## 2016-07-12 ENCOUNTER — Ambulatory Visit: Payer: Self-pay | Admitting: *Deleted

## 2016-07-18 ENCOUNTER — Other Ambulatory Visit: Payer: Self-pay | Admitting: *Deleted

## 2016-07-18 ENCOUNTER — Encounter: Payer: Self-pay | Admitting: *Deleted

## 2016-07-18 NOTE — Patient Outreach (Signed)
[facility]) Care Management  07/18/2016   Barbara Blackburn 1929/03/01 [id number]  Subjective: RN Health Coach telephone call to patient.  Hipaa compliance verified. Per patient she is not feeling good today. Per patient she has some good days and bad days. Patient stated when she first got up she felt a little dizzy. She took her medication for the dizziness. She feels better. Patient does not have any swelling in extremities. Her weight today was 176.5. She weighs every other day. Her weight on 11/19 was 177.5. Patient blood pressure today is 103/81. Patient is not having any coughing or wheezing. No increase in shortness of breath. Patient has received her flu shot for this year.  Patient stated her daughter does all the cooking. RN Health Coach discussed low sodium diet. Patient has agreed to follow up outreach calls.    Objective:   Current Medications:  Current Outpatient Prescriptions  Medication Sig Dispense Refill  . acetaminophen (TYLENOL) 500 MG tablet Take 500 mg by mouth every 6 (six) hours as needed.    [name] amLODipine (NORVASC) 5 MG tablet TAKE 1 TABLET (5 MG TOTAL) BY MOUTH DAILY. 30 tablet 11  . aspirin EC 81 MG tablet Take 1 tablet (81 mg total) by mouth daily. 90 tablet 3  . CRESTOR 20 MG tablet TAKE 1/2 TAB DAILY 30 tablet 5  . fluorouracil (EFUDEX) 5 % cream Apply topically once a week. To basal cell lesion on face    . furosemide (LASIX) 40 MG tablet Take 40 mg by mouth daily.     [name] lidocaine-prilocaine (EMLA) cream Apply 1 application topically as needed. To left shoulder    . losartan (COZAAR) 50 MG tablet TAKE 1 TABLET BY MOUTH EVERY DAY 30 tablet 6  . meloxicam (MOBIC) 15 MG tablet Take 1 tablet (15 mg total) by mouth daily. 60 tablet 1  . metoprolol succinate (TOPROL-XL) 50 MG 24 hr tablet Take 1 tablet (50 mg total) by mouth daily. Take with or immediately following a meal. 30 tablet 11  . potassium bicarbonate (KLOR-CON/EF) 25 MEQ disintegrating  tablet Take 1 tablet (25 mEq total) by mouth daily. (Patient taking differently: Take 12.5 mEq by mouth daily. ) 90 tablet 3  . Probiotic Product (ALIGN) 4 MG CAPS Take by mouth daily.    . rivaroxaban (XARELTO) 20 MG TABS tablet Take 1 tablet (20 mg total) by mouth daily with supper. 30 tablet 6   No current facility-administered medications for this visit.     Functional Status:  In your present state of health, do you have any difficulty performing the following activities: 07/18/2016 06/13/2016  Hearing? Tempie Donning  Vision? N N  Difficulty concentrating or making decisions? N N  Walking or climbing stairs? Y Y  Dressing or bathing? N N  Doing errands, shopping? Tempie Donning  Preparing Food and eating ? Y Y  Using the Toilet? N N  In the past six months, have you accidently leaked urine? N N  Do you have problems with loss of bowel control? N N  Managing your Medications? Y Y  Managing your Finances? N N  Housekeeping or managing your Housekeeping? Y N  Some recent data might be hidden    Fall/Depression Screening: PHQ 2/9 Scores 07/18/2016 06/13/2016 03/13/2016 03/03/2016 02/14/2016 05/24/2015 05/24/2015  PHQ - 2 Score '1 1 1 1 4 2 2  ' PHQ- 9 Score - - - - 9 - 6   THN CM Care Plan Problem One  Flowsheet Row Most Recent Value  Care Plan Problem One  Knowledge Deficit in Self Management of Congestive Heart Failure  Role Documenting the Problem One  [place] for Problem One  Active  THN Long Term Goal (31-90 days)  Patient will have any readmissions for Congestive Hweart Failure within the next 90 days  Interventions for Problem One Long Term Goal  RN reminded patient to keep appointments with primary medical Dr and cardiologist. RN reminded patient the importance of taking medication as per physician order. RN will follow up patient monthly  THN CM Short Term Goal #1 (0-30 days)  Patient will report weighing every other day and documenting  THN CM Short Term Goal #1 Met Date  07/18/16   Interventions for Short Term Goal #1  RN sent educational information on living better with heart RN sent calendar book for documentation.RN will follow up with discussion teach back  THN CM Short Term Goal #2 (0-30 days)  Patient will be able to recognize symptoms of peripheral edma within the next 30 days  THN CM Short Term Goal #2 Start Date  07/18/16  THN CM Short Term Goal #4 (0-30 days)  Patient will be able to verbalize foods low in sodium within the next 30 days  THN CM Short Term Goal #4 Start Date  07/18/16  Interventions for Short Term Goal #4  RN sent pictures chart of foods low in sodium. RN sent EMMI edcuational material on low sodium diet. RN sent educational material on dash diet with samples. RN will follow up with discussion and teach back.      Assessment:  Patient is in the green zone. Patient is weighing every other day and documenting Patient will continue to  benefit from [place] telephonic outreach for education and support for Congestive Heart Failure self management.  Plan:   RN sent EMMI educational material on low sodium diet RN sent picture chart of low sodium foods RN send educational material on dash diet and samples RN will follow up within the month of December.  [place] [facility] Management [phone]

## 2016-08-08 ENCOUNTER — Ambulatory Visit (INDEPENDENT_AMBULATORY_CARE_PROVIDER_SITE_OTHER): Payer: Medicare Other | Admitting: *Deleted

## 2016-08-08 DIAGNOSIS — Z96652 Presence of left artificial knee joint: Secondary | ICD-10-CM | POA: Diagnosis not present

## 2016-08-08 DIAGNOSIS — I509 Heart failure, unspecified: Secondary | ICD-10-CM | POA: Diagnosis not present

## 2016-08-08 DIAGNOSIS — Z95810 Presence of automatic (implantable) cardiac defibrillator: Secondary | ICD-10-CM

## 2016-08-08 NOTE — Progress Notes (Signed)
Remote ICD transmission.   

## 2016-08-16 ENCOUNTER — Encounter: Payer: Self-pay | Admitting: Cardiology

## 2016-08-16 ENCOUNTER — Other Ambulatory Visit: Payer: Self-pay | Admitting: *Deleted

## 2016-08-16 NOTE — Patient Outreach (Signed)
[facility]) Care Management  08/16/2016  Barbara Blackburn 1929/01/20 [id number]   RN Health Coach telephone call to patient.  Hipaa compliance verified.Per patient she has company at this time and is unable to talk. RN made patient she would cll her back again within 14 days. Per patient that would be good.  Plan: RN will call patient again within 14 days.  [place] Care Management [phone]

## 2016-08-22 LAB — CUP PACEART REMOTE DEVICE CHECK
Brady Statistic AP VS Percent: 0 %
Brady Statistic AS VP Percent: 34.32 %
Date Time Interrogation Session: 20171212072727
HIGH POWER IMPEDANCE MEASURED VALUE: 475 Ohm
HighPow Impedance: 47 Ohm
HighPow Impedance: 61 Ohm
Implantable Lead Implant Date: 20070511
Implantable Lead Implant Date: 20070511
Implantable Lead Location: 753860
Implantable Lead Location: 753860
Implantable Lead Model: 6949
Lead Channel Impedance Value: 418 Ohm
Lead Channel Pacing Threshold Amplitude: 0.875 V
Lead Channel Pacing Threshold Pulse Width: 0.4 ms
Lead Channel Sensing Intrinsic Amplitude: 0.25 mV
Lead Channel Sensing Intrinsic Amplitude: 0.25 mV
Lead Channel Setting Pacing Amplitude: 2.5 V
MDC IDC LEAD IMPLANT DT: 20070511
MDC IDC LEAD LOCATION: 753859
MDC IDC MSMT BATTERY VOLTAGE: 2.62 V
MDC IDC MSMT LEADCHNL RV IMPEDANCE VALUE: 551 Ohm
MDC IDC MSMT LEADCHNL RV SENSING INTR AMPL: 3.75 mV
MDC IDC MSMT LEADCHNL RV SENSING INTR AMPL: 3.75 mV
MDC IDC PG IMPLANT DT: 20120426
MDC IDC SET LEADCHNL RV PACING PULSEWIDTH: 0.4 ms
MDC IDC SET LEADCHNL RV SENSING SENSITIVITY: 0.3 mV
MDC IDC STAT BRADY AP VP PERCENT: 0 %
MDC IDC STAT BRADY AS VS PERCENT: 65.68 %
MDC IDC STAT BRADY RA PERCENT PACED: 0 %
MDC IDC STAT BRADY RV PERCENT PACED: 36.59 %

## 2016-08-30 ENCOUNTER — Ambulatory Visit: Payer: Self-pay | Admitting: *Deleted

## 2016-08-31 ENCOUNTER — Other Ambulatory Visit: Payer: Self-pay | Admitting: *Deleted

## 2016-09-05 ENCOUNTER — Ambulatory Visit: Payer: Medicare Other | Admitting: Unknown Physician Specialty

## 2016-09-12 ENCOUNTER — Ambulatory Visit (INDEPENDENT_AMBULATORY_CARE_PROVIDER_SITE_OTHER): Payer: Medicare Other | Admitting: Unknown Physician Specialty

## 2016-09-12 ENCOUNTER — Encounter: Payer: Self-pay | Admitting: Unknown Physician Specialty

## 2016-09-12 VITALS — BP 131/77 | HR 71 | Temp 97.7°F | Ht 61.0 in | Wt 183.8 lb

## 2016-09-12 DIAGNOSIS — F0280 Dementia in other diseases classified elsewhere without behavioral disturbance: Secondary | ICD-10-CM

## 2016-09-12 DIAGNOSIS — I10 Essential (primary) hypertension: Secondary | ICD-10-CM | POA: Diagnosis not present

## 2016-09-12 DIAGNOSIS — E7800 Pure hypercholesterolemia, unspecified: Secondary | ICD-10-CM | POA: Diagnosis not present

## 2016-09-12 DIAGNOSIS — G301 Alzheimer's disease with late onset: Secondary | ICD-10-CM | POA: Diagnosis not present

## 2016-09-12 DIAGNOSIS — Z23 Encounter for immunization: Secondary | ICD-10-CM

## 2016-09-12 DIAGNOSIS — F419 Anxiety disorder, unspecified: Secondary | ICD-10-CM

## 2016-09-12 MED ORDER — MELOXICAM 15 MG PO TABS: 15.0000 mg | ORAL_TABLET | Freq: Every day | ORAL | 2 refills | Status: DC

## 2016-09-12 MED ORDER — SERTRALINE HCL 50 MG PO TABS: 50.0000 mg | ORAL_TABLET | Freq: Every day | ORAL | 1 refills | Status: DC

## 2016-09-12 NOTE — Assessment & Plan Note (Signed)
Stable. Continue with present treatment.

## 2016-09-12 NOTE — Patient Instructions (Signed)
Td Vaccine (Tetanus and Diphtheria): What You Need to Know 1. Why get vaccinated? Tetanus  and diphtheria are very serious diseases. They are rare in the [place] today, but people who do become infected often have severe complications. Td vaccine is used to protect adolescents and adults from both of these diseases. Both tetanus and diphtheria are infections caused by bacteria. Diphtheria spreads from person to person through coughing or sneezing. Tetanus-causing bacteria enter the body through cuts, scratches, or wounds. TETANUS (lockjaw) causes painful muscle tightening and stiffness, usually all over the body.  It can lead to tightening of muscles in the head and neck so you can't open your mouth, swallow, or sometimes even breathe. Tetanus kills about 1 out of every 10 people who are infected even after receiving the best medical care.  DIPHTHERIA can cause a thick coating to form in the back of the throat.  It can lead to breathing problems, paralysis, heart failure, and death.  Before vaccines, as many as 200,000 cases of diphtheria and hundreds of cases of tetanus were reported in the [place] each year. Since vaccination began, reports of cases for both diseases have dropped by about 99%. 2. Td vaccine Td vaccine can protect adolescents and adults from tetanus and diphtheria. Td is usually given as a booster dose every 10 years but it can also be given earlier after a severe and dirty wound or burn. Another vaccine, called Tdap, which protects against pertussis in addition to tetanus and diphtheria, is sometimes recommended instead of Td vaccine. Your doctor or the person giving you the vaccine can give you more information. Td may safely be given at the same time as other vaccines. 3. Some people should not get this vaccine  A person who has ever had a life-threatening allergic reaction after a previous dose of any tetanus or diphtheria containing vaccine, OR has a severe  allergy to any part of this vaccine, should not get Td vaccine. Tell the person giving the vaccine about any severe allergies.  Talk to your doctor if you: ? had severe pain or swelling after any vaccine containing diphtheria or tetanus, ? ever had a condition called Guillain Barre Syndrome (GBS), ? aren't feeling well on the day the shot is scheduled. 4. What are the risks from Td vaccine? With any medicine, including vaccines, there is a chance of side effects. These are usually mild and go away on their own. Serious reactions are also possible but are rare. Most people who get Td vaccine do not have any problems with it. Mild problems following Td vaccine: (Did not interfere with activities)  Pain where the shot was given (about 8 people in 10)  Redness or swelling where the shot was given (about 1 person in 4)  Mild fever (rare)  Headache (about 1 person in 4)  Tiredness (about 1 person in 4)  Moderate problems following Td vaccine: (Interfered with activities, but did not require medical attention)  Fever over 102F (rare)  Severe problems following Td vaccine: (Unable to perform usual activities; required medical attention)  Swelling, severe pain, bleeding and/or redness in the arm where the shot was given (rare).  Problems that could happen after any vaccine:  People sometimes faint after a medical procedure, including vaccination. Sitting or lying down for about 15 minutes can help prevent fainting, and injuries caused by a fall. Tell your doctor if you feel dizzy, or have vision changes or ringing in the ears.  Some people get   severe pain in the shoulder and have difficulty moving the arm where a shot was given. This happens very rarely.  Any medication can cause a severe allergic reaction. Such reactions from a vaccine are very rare, estimated at fewer than 1 in a million doses, and would happen within a few minutes to a few hours after the vaccination. As with any  medicine, there is a very remote chance of a vaccine causing a serious injury or death. The safety of vaccines is always being monitored. For more information, visit: www.cdc.gov/vaccinesafety/ 5. What if there is a serious reaction? What should I look for? Look for anything that concerns you, such as signs of a severe allergic reaction, very high fever, or unusual behavior. Signs of a severe allergic reaction can include hives, swelling of the face and throat, difficulty breathing, a fast heartbeat, dizziness, and weakness. These would usually start a few minutes to a few hours after the vaccination. What should I do?  If you think it is a severe allergic reaction or other emergency that can't wait, call 9-1-1 or get the person to the nearest hospital. Otherwise, call your doctor.  Afterward, the reaction should be reported to the Vaccine Adverse Event Reporting System (VAERS). Your doctor might file this report, or you can do it yourself through the VAERS web site at www.vaers.hhs.gov, or by calling 1-800-822-7967. ? VAERS does not give medical advice. 6. The National Vaccine Injury Compensation Program The National Vaccine Injury Compensation Program (VICP) is a federal program that was created to compensate people who may have been injured by certain vaccines. Persons who believe they may have been injured by a vaccine can learn about the program and about filing a claim by calling 1-800-338-2382 or visiting the VICP website at www.hrsa.gov/vaccinecompensation. There is a time limit to file a claim for compensation. 7. How can I learn more?  Ask your doctor. He or she can give you the vaccine package insert or suggest other sources of information.  Call your local or state health department.  Contact the Centers for Disease Control and Prevention (CDC): ? Call 1-800-232-4636 (1-800-CDC-INFO) ? Visit CDC's website at www.cdc.gov/vaccines CDC Td Vaccine VIS (12/07/15) This information is  not intended to replace advice given to you by your health care provider. Make sure you discuss any questions you have with your health care provider. Document Released: 06/11/2006 Document Revised: 05/04/2016 Document Reviewed: 05/04/2016 Elsevier Interactive Patient Education  2017 Elsevier Inc.  

## 2016-09-12 NOTE — Progress Notes (Signed)
BP 131/77 (BP Location: Left Arm, Patient Position: Sitting, Cuff Size: Large)   Pulse 71   Temp 97.7 F (36.5 C)   Ht 5\' 1"  (1.549 m)   Wt 183 lb 12.8 oz (83.4 kg)   LMP  (LMP Unknown)   SpO2 96%   BMI 34.73 kg/m    Subjective:    Patient ID: Barbara Blackburn, female    DOB: 02/28/1929, 83 y.o.   MRN: [id number]  HPI: Barbara Blackburn is a 83 y.o. female    Chief Complaint  Patient presents with  . Hyperlipidemia  . Hypertension  . Medication Refill    pt states she would like a refill on meloxicam for her hip   Hyperlipidemia Patient states that she has been taking her Crestor as prescribed. She notes she also walks around her apartment and does 4-5 laps of her daughter's garage daily, as well as some squats when her hip is feeling well. Lipid panel will be checked today.   Hypertension Reports to be taking metoprolol, losarten and furosemide as prescribed and checking blood pressure "every other day or so" with measurements around 122/80 mmHg. Today's blood pressure is 131/77 mmHg.   Hip Pain Experiencing some left hip pain due to arthritis. She has been taking meloxicam 15 mg once a day prescribed by orthopedics, which she notes to be helping. She has also been taking 400 mg ibuprofen 3 times a day.  Anxiety Patient has been experiencing some anxiety and agitation since the death of her sister las week. Daughter reports giving her "a quarter of a xanax" to help with anxiety.   Memory Patient and daughter note that she "has some good days and some bad days." Daughter has been encouraging the patient to read more, which she believes has been helpful. Enquired about donepezil. Discussed pros and cons of medication.  Relevant past medical, surgical, family and social history reviewed and updated as indicated. Interim medical history since our last visit reviewed. Allergies and medications reviewed and updated.  Review of Systems  Constitutional: Negative for appetite  change, fatigue and unexpected weight change.  HENT: Negative.  Negative for congestion and rhinorrhea.   Eyes: Negative.   Respiratory: Negative for cough and chest tightness.   Cardiovascular: Negative.  Negative for chest pain, palpitations and leg swelling.  Gastrointestinal: Negative.   Endocrine: Negative.   Genitourinary: Negative.   Musculoskeletal: Positive for arthralgias.  Allergic/Immunologic: Negative.   Neurological: Positive for light-headedness. Negative for numbness and headaches.  Hematological: Negative.   Psychiatric/Behavioral: The patient is nervous/anxious.        Notes some memory problems.    Per HPI unless specifically indicated above     Objective:    BP 131/77 (BP Location: Left Arm, Patient Position: Sitting, Cuff Size: Large)   Pulse 71   Temp 97.7 F (36.5 C)   Ht 5\' 1"  (1.549 m)   Wt 183 lb 12.8 oz (83.4 kg)   LMP  (LMP Unknown)   SpO2 96%   BMI 34.73 kg/m   Wt Readings from Last 3 Encounters:  09/12/16 183 lb 12.8 oz (83.4 kg)  07/18/16 177 lb 8 oz (80.5 kg)  06/13/16 178 lb 8 oz (81 kg)    Physical Exam  Constitutional: She is oriented to person, place, and time. She appears well-developed and well-nourished. No distress.  HENT:  Head: Normocephalic.  Eyes: Conjunctivae are normal. No scleral icterus.  Cardiovascular: S1 normal, S2 normal and normal heart sounds.  An  irregularly irregular rhythm present.  Pulmonary/Chest: Effort normal and breath sounds normal. No respiratory distress.  Neurological: She is alert and oriented to person, place, and time.  Skin: Skin is warm and dry. No rash noted.  Psychiatric: She has a normal mood and affect. Her behavior is normal. Judgment and thought content normal.       Assessment & Plan:   Problem List Items Addressed This Visit      Cardiovascular and Mediastinum   Essential hypertension, benign    Stable. Continue with present treatment.      Relevant Orders   Comprehensive  metabolic panel     Nervous and Auditory   Dementia    Patient decided against starting donepezil at this time. She will continue reading and possibly start jigsaw and/or crossword puzzles.       Relevant Medications   sertraline (ZOLOFT) 50 MG tablet     Other   Hypercholesterolemia    Stable. Check Lipid Panel.      Relevant Orders   Lipid Panel w/o Chol/HDL Ratio   Acute anxiety    Starting on sertraline 50 mg.      Relevant Medications   sertraline (ZOLOFT) 50 MG tablet    Other Visit Diagnoses    Need for vaccine for TD (tetanus-diphtheria)    -  Primary   Relevant Orders   Td vaccine greater than or equal to 7yo preservative free IM (Completed)       Follow up plan: Return in about 6 months (around 03/12/2017).

## 2016-09-12 NOTE — Assessment & Plan Note (Signed)
Patient decided against starting donepezil at this time. She will continue reading and possibly start jigsaw and/or crossword puzzles.

## 2016-09-12 NOTE — Assessment & Plan Note (Signed)
Stable. Check Lipid Panel.

## 2016-09-12 NOTE — Assessment & Plan Note (Addendum)
Starting on sertraline 50 mg. Recommended against Xanax

## 2016-09-13 LAB — COMPREHENSIVE METABOLIC PANEL
ALK PHOS: 85 IU/L (ref 39–117)
ALT: 8 IU/L (ref 0–32)
AST: 14 IU/L (ref 0–40)
Albumin/Globulin Ratio: 1.9 (ref 1.2–2.2)
Albumin: 4.3 g/dL (ref 3.5–4.7)
BILIRUBIN TOTAL: 0.7 mg/dL (ref 0.0–1.2)
BUN / CREAT RATIO: 31 — AB (ref 12–28)
BUN: 26 mg/dL (ref 8–27)
CHLORIDE: 100 mmol/L (ref 96–106)
CO2: 23 mmol/L (ref 18–29)
CREATININE: 0.83 mg/dL (ref 0.57–1.00)
Calcium: 9.3 mg/dL (ref 8.7–10.3)
GFR calc Af Amer: 74 mL/min/{1.73_m2} (ref >59)
GFR calc non Af Amer: 65 mL/min/{1.73_m2} (ref >59)
GLOBULIN, TOTAL: 2.3 g/dL (ref 1.5–4.5)
Glucose: 94 mg/dL (ref 65–99)
Potassium: 4.7 mmol/L (ref 3.5–5.2)
SODIUM: 142 mmol/L (ref 134–144)
Total Protein: 6.6 g/dL (ref 6.0–8.5)

## 2016-09-13 LAB — LIPID PANEL W/O CHOL/HDL RATIO
CHOLESTEROL TOTAL: 149 mg/dL (ref 100–199)
HDL: 57 mg/dL (ref >39)
LDL Calculated: 37 mg/dL (ref 0–99)
TRIGLYCERIDES: 276 mg/dL — AB (ref 0–149)
VLDL Cholesterol Cal: 55 mg/dL — ABNORMAL HIGH (ref 5–40)

## 2016-09-14 NOTE — Patient Outreach (Incomplete)
[facility]) Care Management  09/14/2016 Late note entry  MOE ROHLFS 1929-02-17 [id number]   [place] telephone call to patient.  Hipaa compliance verified.Per patient she is in the green zone. Per patient She is weighing daily . Patient daughter purchased a lift chair so that she can keep her feet elevated when to decrease edema   Current Medications:  Current Outpatient Prescriptions  Medication Sig Dispense Refill  . acetaminophen (TYLENOL) 500 MG tablet Take 500 mg by mouth every 6 (six) hours as needed.    [name] amLODipine (NORVASC) 5 MG tablet TAKE 1 TABLET (5 MG TOTAL) BY MOUTH DAILY. 30 tablet 11  . aspirin EC 81 MG tablet Take 1 tablet (81 mg total) by mouth daily. 90 tablet 3  . CRESTOR 20 MG tablet TAKE 1/2 TAB DAILY 30 tablet 5  . furosemide (LASIX) 40 MG tablet Take 40 mg by mouth daily.     [name] losartan (COZAAR) 50 MG tablet TAKE 1 TABLET BY MOUTH EVERY DAY 30 tablet 6  . metoprolol succinate (TOPROL-XL) 50 MG 24 hr tablet Take 1 tablet (50 mg total) by mouth daily. Take with or immediately following a meal. 30 tablet 11  . potassium bicarbonate (KLOR-CON/EF) 25 MEQ disintegrating tablet Take 1 tablet (25 mEq total) by mouth daily. (Patient taking differently: Take 12.5 mEq by mouth daily. ) 90 tablet 3  . Probiotic Product (ALIGN) 4 MG CAPS Take by mouth daily.    . rivaroxaban (XARELTO) 20 MG TABS tablet Take 1 tablet (20 mg total) by mouth daily with supper. 30 tablet 6  . ibuprofen (ADVIL,MOTRIN) 400 MG tablet Take 400 mg by mouth every 8 (eight) hours as needed.    . meloxicam (MOBIC) 15 MG tablet Take 1 tablet (15 mg total) by mouth daily. 60 tablet 2  . sertraline (ZOLOFT) 50 MG tablet Take 1 tablet (50 mg total) by mouth daily. 30 tablet 1   No current facility-administered medications for this visit.     Functional Status:  In your present state of health, do you have any difficulty performing the following activities: 08/31/2016  07/18/2016  Hearing? Tempie Donning  Vision? N N  Difficulty concentrating or making decisions? N N  Walking or climbing stairs? Y Y  Dressing or bathing? N N  Doing errands, shopping? Tempie Donning  Preparing Food and eating ? Y Y  Using the Toilet? N N  In the past six months, have you accidently leaked urine? N N  Do you have problems with loss of bowel control? N N  Managing your Medications? Y Y  Managing your Finances? N N  Housekeeping or managing your Housekeeping? N Y  Some recent data might be hidden    Fall/Depression Screening: PHQ 2/9 Scores 08/31/2016 07/18/2016 06/13/2016 03/13/2016 03/03/2016 02/14/2016 05/24/2015  PHQ - 2 Score 1 1 1 1 1 4 2   PHQ- 9 Score - - - - - 9 -    Assessment:   Plan:

## 2016-09-15 ENCOUNTER — Encounter: Payer: Self-pay | Admitting: Unknown Physician Specialty

## 2016-09-19 ENCOUNTER — Ambulatory Visit (INDEPENDENT_AMBULATORY_CARE_PROVIDER_SITE_OTHER): Payer: Medicare Other | Admitting: Cardiology

## 2016-09-19 ENCOUNTER — Encounter: Payer: Self-pay | Admitting: Cardiology

## 2016-09-19 VITALS — BP 140/80 | HR 66 | Ht 61.0 in | Wt 182.8 lb

## 2016-09-19 DIAGNOSIS — E784 Other hyperlipidemia: Secondary | ICD-10-CM

## 2016-09-19 DIAGNOSIS — I481 Persistent atrial fibrillation: Secondary | ICD-10-CM | POA: Diagnosis not present

## 2016-09-19 DIAGNOSIS — I10 Essential (primary) hypertension: Secondary | ICD-10-CM

## 2016-09-19 DIAGNOSIS — I5032 Chronic diastolic (congestive) heart failure: Secondary | ICD-10-CM | POA: Diagnosis not present

## 2016-09-19 DIAGNOSIS — I2510 Atherosclerotic heart disease of native coronary artery without angina pectoris: Secondary | ICD-10-CM | POA: Diagnosis not present

## 2016-09-19 DIAGNOSIS — E7849 Other hyperlipidemia: Secondary | ICD-10-CM

## 2016-09-19 DIAGNOSIS — I4819 Other persistent atrial fibrillation: Secondary | ICD-10-CM

## 2016-09-19 NOTE — Progress Notes (Signed)
Cardiology Office Note   Date:  09/19/2016   ID:  Barbara Blackburn, DOB 06/09/1929, MRN [id number]  Referring Doctor:  Kathrine Haddock, NP   Cardiologist:   Wende Bushy, MD   Reason for consultation:  Chief Complaint  Patient presents with  . Follow-up    no complaints.      History of Present Illness: Barbara Blackburn is a 83 y.o. female who presents for Follow-up for history of CAD, atrial fibrillation  In terms of atrial fibrillation, her heart rate seems to be controlled on the current dose of metoprolol. When she last had Dr. Caryl Comes March 2017, he reduced her metoprolol from 100 250 daily due to concerns with fatigue. She has been tolerating Xarelto.  In terms of CAD, she had a stent in the past. She denies chest pain, shortness of breath is the same as before. She has jaw pain but this is related to opening the mouth and is associated with sound or a click.  In terms of CHF, as mentioned, her shortness of breath is chronic and stable. She denies PND, orthopnea, edema.   Patient denies chest pain, PND, orthopnea, edema, fever, cough, colds, abdominal pain. She has chronic fatigue and feeling tired. She thinks this is a little worse or probably the same as before. Recently dealing with the loss of her sister the last 2 weeks. Also started on Zoloft recently.   ROS:  Please see the history of present illness. Aside from mentioned under HPI, all other systems are reviewed and negative.    Past Medical History:  Diagnosis Date  . 6949-lead 11/11/2013  . Allergy   . CHF (congestive heart failure) (HCC)    class 2  . Chronic atrial fibrillation ([place])   . Coronary artery disease   . Degenerative joint disease    severe  . Dyslipidemia   . Hyperlipidemia   . ICD-Medtronic 05/10/2009   Qualifier: Diagnosis of  By: Lovena Le, MD, [facility], Binnie Kand   . Nonischemic cardiomyopathy [facility])     Past Surgical History:  Procedure Laterality Date  . Medtronic dual-chamber ICD   01/05/2006   generator change April 2012  . PCI stent    . REPLACEMENT TOTAL KNEE       reports that she has never smoked. She has never used smokeless tobacco. She reports that she does not drink alcohol or use drugs.   family history includes Aneurysm in her maternal grandmother and sister; Cancer in her mother and sister; Heart attack in her father; Hypertension in her sister.   Current Outpatient Prescriptions  Medication Sig Dispense Refill  . acetaminophen (TYLENOL) 500 MG tablet Take 500 mg by mouth every 6 (six) hours as needed.    [name] amLODipine (NORVASC) 5 MG tablet TAKE 1 TABLET (5 MG TOTAL) BY MOUTH DAILY. 30 tablet 11  . aspirin EC 81 MG tablet Take 1 tablet (81 mg total) by mouth daily. 90 tablet 3  . CRESTOR 20 MG tablet TAKE 1/2 TAB DAILY 30 tablet 5  . furosemide (LASIX) 40 MG tablet Take 40 mg by mouth daily.     [name] losartan (COZAAR) 50 MG tablet TAKE 1 TABLET BY MOUTH EVERY DAY 30 tablet 6  . meloxicam (MOBIC) 15 MG tablet Take 1 tablet (15 mg total) by mouth daily. 60 tablet 2  . metoprolol succinate (TOPROL-XL) 50 MG 24 hr tablet Take 1 tablet (50 mg total) by mouth daily. Take with or immediately following a meal. 30 tablet  11  . potassium bicarbonate (KLOR-CON/EF) 25 MEQ disintegrating tablet Take 1 tablet (25 mEq total) by mouth daily. (Patient taking differently: Take 12.5 mEq by mouth daily. ) 90 tablet 3  . Probiotic Product (ALIGN) 4 MG CAPS Take by mouth daily.    . rivaroxaban (XARELTO) 20 MG TABS tablet Take 1 tablet (20 mg total) by mouth daily with supper. 30 tablet 6  . sertraline (ZOLOFT) 50 MG tablet Take 1 tablet (50 mg total) by mouth daily. 30 tablet 1   No current facility-administered medications for this visit.     Allergies: Clonazepam; Fluoxetine hcl; Lisinopril; and Zolpidem tartrate    PHYSICAL EXAM: VS:  BP 140/80 (BP Location: Left Arm, Patient Position: Sitting, Cuff Size: Normal)   Pulse 66   Ht 5\' 1"  (1.549 m)   Wt 182 lb 12.8 oz (82.9  kg)   LMP  (LMP Unknown)   BMI 34.54 kg/m  , Body mass index is 34.54 kg/m. Wt Readings from Last 3 Encounters:  09/19/16 182 lb 12.8 oz (82.9 kg)  09/12/16 183 lb 12.8 oz (83.4 kg)  07/18/16 177 lb 8 oz (80.5 kg)     GENERAL:  well developed, well nourished, obese, not in acute distress HEENT: normocephalic, pink conjunctivae, anicteric sclerae, no xanthelasma, normal dentition, oropharynx clear NECK:  no neck vein engorgement, JVP normal, no hepatojugular reflux, carotid upstroke brisk and symmetric, no bruit, no thyromegaly, no lymphadenopathy LUNGS:  good respiratory effort, clear to auscultation bilaterally CV:  PMI not displaced, no thrills, no lifts, S1 and S2 within normal limits, no palpable S3 or S4, no murmurs, no rubs, no gallops ABD:  Soft, nontender, nondistended, normoactive bowel sounds, no abdominal aortic bruit, no hepatomegaly, no splenomegaly MS: nontender back, no kyphosis, no scoliosis, no joint deformities EXT:  2+ DP/PT pulses, no edema, no varicosities, no cyanosis, no clubbing SKIN: warm, nondiaphoretic, normal turgor, no ulcers NEUROPSYCH: alert, oriented to person, place, and time, sensory/motor grossly intact, normal mood, appropriate affect   Recent Labs: 11/09/2015: TSH 3.329 01/07/2016: B Natriuretic Peptide 218.0; Hemoglobin 14.3 04/21/2016: Platelets 184 09/12/2016: ALT 8; BUN 26; Creatinine, Ser 0.83; Potassium 4.7; Sodium 142   Lipid Panel    Component Value Date/Time   CHOL 149 09/12/2016 1119   CHOL 145 09/01/2015 1101   TRIG 276 (H) 09/12/2016 1119   TRIG 182 (H) 09/01/2015 1101   HDL 57 09/12/2016 1119   VLDL 36 (H) 09/01/2015 1101   LDLCALC 37 09/12/2016 1119     Other studies Reviewed:  EKG:  The ekg from 02/15/2016 was personally reviewed by me and it revealed likely demand pacemaker with underlying rhythm to be atrial fibrillation. Nonspecific ST-T wave abnormalities. Ventricular rate 64 BPM.  EKG from 03/21/2016 was personally  reviewed by me and it revealed atrial fibrillation, ventricular rate of 72 BPM. Nonspecific ST-T wave changes. Aberrantly conducted beats versus PVCs.  EKG from 03/19/2017 was personally reviewed by me and showed atrial fibrillation, aberrantly conducted beats, ventricular rate is controlled at 66 BPM. Nonspecific ST-T wave changes.  Additional studies/ records that were reviewed personally reviewed by me today include:  From [name]: Lexi scan sestamibi study 10/16/2012 revealed LVEF of 50%, fixed inferior wall defect consistent with scar  Echo 11/30/2015: Left ventricle: The cavity size was normal. There was mild  concentric hypertrophy. Systolic function was normal. The  estimated ejection fraction was in the range of 60% to 65%. Wall  motion was normal; there were no regional wall motion  abnormalities.  The study is not technically sufficient to allow  evaluation of LV diastolic function. - Mitral valve: There was mild regurgitation. - Left atrium: The atrium was moderately dilated. - Right ventricle: Pacer wire or catheter noted in right ventricle.  Systolic function was normal. - Tricuspid valve: There was mild-moderate regurgitation. - Pulmonary arteries: Systolic pressure was within the normal  range.   ASSESSMENT AND PLAN:  CAD s/p Xience stent to LAD 12/2007 No angina. Shortness of breath is to same as before. Per office note 03/21/2016, No ischemia from stress test 2014. Continue with medical therapy: Low-dose aspirin as she does have established CAD, beta blocker, ARB, Crestor. We discussed possibly doing a trial of further reducing them metoprolol succinate from 50 daily to 25 daily if her fatigue does not get better in the next one or 2 months.  CHF, systolic, based on echocardiogram from April 2017, EF has recovered, likely diastolic now Ischemic Cardiomyopathy S/p ICD - patient follows up with EP clinic No evidence of acute decompensation of CHF.  Continue medical  therapy: Per office visit 03/21/2016, Lasix, potassium, losartan, metoprolol. May take half a tablet of the furosemide 40 mg when necessary for increased swelling. Patient advised to let our office know if she takes this more than 3 times in a week. Daily weights and low sodium diet.   atrial fibrillation, persistent Ventricular rate control. Continue Xarelto.  Hypertension BP is well controlled. Continue monitoring BP. Continue current medical therapy and lifestyle changes.  Hyperlipidemia Lipid levels at goal (LDL < 70). Continue current medical therapy and lifestyle changes.  Abdominal aorta atherosclerosis Incidental finding on lumbar x-ray. Recommended abdominal ultrasound last time. We'll rediscuss again on follow-up visit.    Current medicines are reviewed at length with the patient today.  The patient does not have concerns regarding medicines.  Labs/ tests ordered today include:  Orders Placed This Encounter  Procedures  . EKG 12-Lead    I had a lengthy and detailed discussion with the patient regarding diagnoses, prognosis, diagnostic options, treatment options , and side effects of medications.   I counseled the patient on importance of lifestyle modification including heart healthy diet, regular physical activity .    Disposition:   FU with undersigned in 6 months   Signed, Wende Bushy, MD  09/19/2016 11:20 AM    [place]

## 2016-09-19 NOTE — Patient Instructions (Addendum)
Follow-Up: Your physician wants you to follow-up in: 6 months with Dr. Yvone Neu. You will receive a reminder letter in the mail two months in advance. If you don't receive a letter, please call our office to schedule the follow-up appointment.  It was a pleasure seeing you today here in the office. Please do not hesitate to give [place] a call back if you have any further questions. [place], BSN     Follow up appointment with Dr. Caryl Comes 11/07/16 at 11:15AM

## 2016-10-23 ENCOUNTER — Telehealth: Payer: Self-pay | Admitting: Cardiology

## 2016-10-23 NOTE — Telephone Encounter (Signed)
Spoke w/ pt and informed her that her device has reached ERI. Informed her that MD will talk w/ her about procedure at her appt on 11-07-16. Pt verbalized understanding.

## 2016-10-25 ENCOUNTER — Other Ambulatory Visit: Payer: Self-pay | Admitting: Internal Medicine

## 2016-10-25 ENCOUNTER — Other Ambulatory Visit: Payer: Self-pay | Admitting: Cardiology

## 2016-10-25 DIAGNOSIS — R5383 Other fatigue: Secondary | ICD-10-CM

## 2016-11-07 ENCOUNTER — Ambulatory Visit (INDEPENDENT_AMBULATORY_CARE_PROVIDER_SITE_OTHER): Payer: Medicare Other | Admitting: Internal Medicine

## 2016-11-07 ENCOUNTER — Encounter: Payer: Self-pay | Admitting: Internal Medicine

## 2016-11-07 VITALS — BP 146/72 | HR 62 | Ht 61.0 in | Wt 181.5 lb

## 2016-11-07 DIAGNOSIS — Z01812 Encounter for preprocedural laboratory examination: Secondary | ICD-10-CM | POA: Diagnosis not present

## 2016-11-07 DIAGNOSIS — I5022 Chronic systolic (congestive) heart failure: Secondary | ICD-10-CM | POA: Diagnosis not present

## 2016-11-07 DIAGNOSIS — I4819 Other persistent atrial fibrillation: Secondary | ICD-10-CM

## 2016-11-07 DIAGNOSIS — Z95810 Presence of automatic (implantable) cardiac defibrillator: Secondary | ICD-10-CM | POA: Diagnosis not present

## 2016-11-07 DIAGNOSIS — I481 Persistent atrial fibrillation: Secondary | ICD-10-CM | POA: Diagnosis not present

## 2016-11-07 DIAGNOSIS — I428 Other cardiomyopathies: Secondary | ICD-10-CM | POA: Diagnosis not present

## 2016-11-07 LAB — CUP PACEART INCLINIC DEVICE CHECK
Battery Voltage: 2.61 V
Brady Statistic AP VS Percent: 0 %
Brady Statistic RA Percent Paced: 0 %
Date Time Interrogation Session: 20180313154555
HIGH POWER IMPEDANCE MEASURED VALUE: 399 Ohm
HighPow Impedance: 41 Ohm
HighPow Impedance: 52 Ohm
Implantable Lead Implant Date: 20070511
Implantable Lead Location: 753860
Implantable Lead Model: 5076
Lead Channel Sensing Intrinsic Amplitude: 0.375 mV
Lead Channel Setting Pacing Amplitude: 2.5 V
Lead Channel Setting Pacing Pulse Width: 0.4 ms
Lead Channel Setting Sensing Sensitivity: 0.3 mV
MDC IDC LEAD IMPLANT DT: 20070511
MDC IDC LEAD IMPLANT DT: 20070511
MDC IDC LEAD LOCATION: 753859
MDC IDC LEAD LOCATION: 753860
MDC IDC MSMT LEADCHNL RA IMPEDANCE VALUE: 399 Ohm
MDC IDC MSMT LEADCHNL RV IMPEDANCE VALUE: 513 Ohm
MDC IDC MSMT LEADCHNL RV PACING THRESHOLD AMPLITUDE: 0.75 V
MDC IDC MSMT LEADCHNL RV PACING THRESHOLD PULSEWIDTH: 0.4 ms
MDC IDC MSMT LEADCHNL RV SENSING INTR AMPL: 1.625 mV
MDC IDC PG IMPLANT DT: 20120426
MDC IDC STAT BRADY AP VP PERCENT: 0 %
MDC IDC STAT BRADY AS VP PERCENT: 32.09 %
MDC IDC STAT BRADY AS VS PERCENT: 67.91 %
MDC IDC STAT BRADY RV PERCENT PACED: 35.17 %

## 2016-11-07 NOTE — Progress Notes (Signed)
Patient Care Team: Kathrine Haddock, NP as PCP - General (Nurse Practitioner) Wende Bushy, MD as Consulting Physician (Cardiology) Winfield Rast, DC as Referring Physician (Chiropractic Medicine) Deboraha Sprang, MD as Consulting Physician (Cardiology) Verlin Grills, RN as [place] Management   HPI  Barbara Blackburn is a 83 y.o. female Seen in followup for a dual chamber ICD implanted for syncope in the setting of  Nonischemic and  ischemic cardiomyopathy with prior STENTING  and congestive heart failure.  Her ejection fraction had been noted to be 20%  Myoview per [facility]) notes 2/14>>EF 50% w inferior scar  She had  received a defibrillator with a 6949-lead At  Device generator replacement in 2012 she underwent "pirating" of her rate sense pacing lead and capping of the rate sense portion of her ICD lead   At last visit she was complaining of fatigue and weakness decreased her beta blocker.  This was no better on lower dose beta blockers  She has struggled with orthostatic intolerance none of late  She has hx of Atrial fibrillation now permanent, with prior stroke>> now on Rivaroxaban without bleeding issues  Past Medical History:  Diagnosis Date  . 6949-lead 11/11/2013  . Allergy   . CHF (congestive heart failure) (HCC)    class 2  . Chronic atrial fibrillation ([place])   . Coronary artery disease   . Degenerative joint disease    severe  . Dyslipidemia   . Hyperlipidemia   . ICD-Medtronic 05/10/2009   Qualifier: Diagnosis of  By: Lovena Le, MD, [facility], Binnie Kand   . Nonischemic cardiomyopathy [facility])     Past Surgical History:  Procedure Laterality Date  . Medtronic dual-chamber ICD  01/05/2006   generator change April 2012  . PCI stent    . REPLACEMENT TOTAL KNEE      Current Outpatient Prescriptions  Medication Sig Dispense Refill  . amLODipine (NORVASC) 5 MG tablet TAKE 1 TABLET (5 MG TOTAL) BY MOUTH DAILY. 30 tablet 11  . aspirin EC  81 MG tablet Take 1 tablet (81 mg total) by mouth daily. 90 tablet 3  . CRESTOR 20 MG tablet TAKE 1/2 TAB DAILY 30 tablet 5  . furosemide (LASIX) 40 MG tablet Take 40 mg by mouth daily.     [name] ibuprofen (ADVIL,MOTRIN) 400 MG tablet Take 400 mg by mouth 3 (three) times daily.    [name] losartan (COZAAR) 50 MG tablet TAKE 1 TABLET BY MOUTH EVERY DAY 30 tablet 6  . metoprolol succinate (TOPROL-XL) 50 MG 24 hr tablet TAKE 1 TABLET (50 MG TOTAL) BY MOUTH DAILY. TAKE WITH OR IMMEDIATELY FOLLOWING A MEAL. 30 tablet 6  . potassium bicarbonate (KLOR-CON/EF) 25 MEQ disintegrating tablet Take 1 tablet (25 mEq total) by mouth daily. (Patient taking differently: Take 12.5 mEq by mouth daily. ) 90 tablet 3  . Probiotic Product (ALIGN) 4 MG CAPS Take by mouth daily.    . sertraline (ZOLOFT) 50 MG tablet Take 1 tablet (50 mg total) by mouth daily. 30 tablet 1  . XARELTO 20 MG TABS tablet TAKE 1 TABLET (20 MG TOTAL) BY MOUTH DAILY WITH SUPPER. 30 tablet 5   No current facility-administered medications for this visit.     Allergies  Allergen Reactions  . Clonazepam   . Fluoxetine Hcl Other (See Comments)    nightmares  . Lisinopril   . Zolpidem Tartrate Other (See Comments)    Ambien    Review of Systems  negative except from HPI and PMH  Physical Exam BP (!) 146/72 (BP Location: Left Arm, Patient Position: Sitting, Cuff Size: Normal)   Pulse 62   Ht 5\' 1"  (1.549 m)   Wt 181 lb 8 oz (82.3 kg)   LMP  (LMP Unknown)   BMI 34.29 kg/m  Well developed and nourished in no acute distress HENT normal Neck supple with JVP 6-8 Clear Device pocket well healed; without hematoma or erythema.  There is no tethering  all other etiology and her  Regular rate and rhythm, no murmurs or gallops Abd-soft with active BS No Clubbing cyanosis edema Skin-warm and dry A & Oriented  Grossly normal sensory and motor function she is sitting in a wheelchair   ECG demonstrates atrial fibrillation 62 Intervals-/09/44 T-wave  inversions inferiorly and V4-6  Assessment and  Plan  Nonischemic cardiomyopathy  Atrial fibrillation permanent  Congestive heart failure-chronic systolic  Syncope  ICD for secondary prevention  6949-lead with capping of the rate sense portion  The patient's device was interrogated.  The information was reviewed. No changes were made in the programming.    Orthostatic intolerance    Her device has reached ERI. Given her age, we've discussed whether we replace with high-voltage or low voltage. She ventricularly paces 33% of the time.   Given his fatigue, we will discontinue the metoprolol altogether and see how she feels.  Echocardiogram last year demonstrated normal LV function despite ventricular pacing greater than 20%. No interval syncope  On Anticoagulation;  No bleeding issues   More than 50% of 45 min was spent in counseling related to the above

## 2016-11-07 NOTE — Patient Instructions (Addendum)
Medication Instructions: - Your physician has recommended you make the following change in your medication:  1) Stop metoprolol 2) Stop aspirin  Labwork: - Your physician recommends that you return for lab work: Wednesday 11/29/16 at 9:00 am (BMP/CBC)  Procedures/Testing: - Your physician has recommended that you have a defibrillator generator (battery) change.  ** This is scheduled for Wednesday 12/06/16 at 10:30 am (you will need to [facility] at [facility] at 8:30 am)   ** Dr. Olin Pia nurse, Nira Conn, will mail you a detailed letter of instructions  Follow-Up: - Your physician recommends that you schedule a follow-up appointment in: about 14 days (from 12/06/16)   Any Additional Special Instructions Will Be Listed Below (If Applicable).     If you need a refill on your cardiac medications before your next appointment, please call your pharmacy.

## 2016-11-08 DIAGNOSIS — C4441 Basal cell carcinoma of skin of scalp and neck: Secondary | ICD-10-CM | POA: Diagnosis not present

## 2016-11-08 DIAGNOSIS — C44319 Basal cell carcinoma of skin of other parts of face: Secondary | ICD-10-CM | POA: Diagnosis not present

## 2016-11-08 DIAGNOSIS — D485 Neoplasm of uncertain behavior of skin: Secondary | ICD-10-CM | POA: Diagnosis not present

## 2016-11-13 ENCOUNTER — Other Ambulatory Visit: Payer: Self-pay | Admitting: Unknown Physician Specialty

## 2016-11-23 ENCOUNTER — Other Ambulatory Visit: Payer: Self-pay | Admitting: Unknown Physician Specialty

## 2016-11-23 ENCOUNTER — Other Ambulatory Visit: Payer: Self-pay | Admitting: Internal Medicine

## 2016-11-29 ENCOUNTER — Other Ambulatory Visit: Payer: Self-pay | Admitting: *Deleted

## 2016-11-29 ENCOUNTER — Other Ambulatory Visit (INDEPENDENT_AMBULATORY_CARE_PROVIDER_SITE_OTHER): Payer: Medicare Other

## 2016-11-29 ENCOUNTER — Encounter: Payer: Self-pay | Admitting: *Deleted

## 2016-11-29 DIAGNOSIS — Z01818 Encounter for other preprocedural examination: Secondary | ICD-10-CM

## 2016-11-29 DIAGNOSIS — I4819 Other persistent atrial fibrillation: Secondary | ICD-10-CM

## 2016-11-29 DIAGNOSIS — I10 Essential (primary) hypertension: Secondary | ICD-10-CM | POA: Diagnosis not present

## 2016-11-29 DIAGNOSIS — I481 Persistent atrial fibrillation: Secondary | ICD-10-CM | POA: Diagnosis not present

## 2016-11-29 MED ORDER — ALPRAZOLAM 0.25 MG PO TABS
ORAL_TABLET | ORAL | 0 refills | Status: DC
Start: 2016-11-29 — End: 2017-03-14

## 2016-11-29 NOTE — Patient Outreach (Signed)
[facility]) Care Management  11/29/2016   Barbara Blackburn 1929/05/05 [id number]  RN Health Coach telephone call to patient.  Hipaa compliance verified.  Per patient she is weighing every other day. Per patient her weight is remaining the same. Patient is also checking blood pressure at home every other day. Per patient she is eating good. She is trying to eat low sodium and low fat diet. Her daughter who lives behind her prepares her meals mostly. Patient wears hearing aids but still has a little difficulty hearing. Patient exercises by walking around the house and thru the garage several times a day and does squats before going to bed. Per patient she is able to afford her medications and has transportation for Dr visits. Patient uses a cane around the house and walker when she goes out. Patient is having her battery changed in her ICD on 04/11. She is anxious. Per patient just going to the hospital makes her anxious. RN allowed patient time to allay her feelings. Patient has agreed to follow up outreach call.    Current Medications:  Current Outpatient Prescriptions  Medication Sig Dispense Refill  . amLODipine (NORVASC) 5 MG tablet TAKE 1 TABLET (5 MG TOTAL) BY MOUTH DAILY. 30 tablet 11  . furosemide (LASIX) 40 MG tablet Take 40 mg by mouth daily.     [name] ibuprofen (ADVIL,MOTRIN) 400 MG tablet Take 400 mg by mouth 3 (three) times daily.    [name] losartan (COZAAR) 50 MG tablet TAKE 1 TABLET BY MOUTH EVERY DAY 30 tablet 3  . potassium bicarbonate (KLOR-CON/EF) 25 MEQ disintegrating tablet Take 1 tablet (25 mEq total) by mouth daily. (Patient taking differently: Take 12.5 mEq by mouth daily. ) 90 tablet 3  . Probiotic Product (ALIGN) 4 MG CAPS Take by mouth daily.    . rosuvastatin (CRESTOR) 20 MG tablet TAKE 1/2 TABLET DAILY 30 tablet 2  . sertraline (ZOLOFT) 50 MG tablet TAKE 1 TABLET (50 MG TOTAL) BY MOUTH DAILY. 30 tablet 1  . XARELTO 20 MG TABS tablet TAKE 1 TABLET (20 MG  TOTAL) BY MOUTH DAILY WITH SUPPER. 30 tablet 5   No current facility-administered medications for this visit.     Functional Status:  In your present state of health, do you have any difficulty performing the following activities: 11/29/2016 08/31/2016  Hearing? Tempie Donning  Vision? N N  Difficulty concentrating or making decisions? N N  Walking or climbing stairs? Y Y  Dressing or bathing? N N  Doing errands, shopping? Tempie Donning  Preparing Food and eating ? Y Y  Using the Toilet? N N  In the past six months, have you accidently leaked urine? N N  Do you have problems with loss of bowel control? N N  Managing your Medications? Y Y  Managing your Finances? N N  Housekeeping or managing your Housekeeping? Y N  Some recent data might be hidden    Fall/Depression Screening: PHQ 2/9 Scores 11/29/2016 08/31/2016 07/18/2016 06/13/2016 03/13/2016 03/03/2016 02/14/2016  PHQ - 2 Score 0 1 1 1 1 1 4   PHQ- 9 Score - - - - - - 9   THN CM Care Plan Problem One     Most Recent Value  Care Plan Problem One  Knowledge Deficit in Self Management of Congestive Heart Failure  Role Documenting the Problem One  [place] for Problem One  Active  THN Long Term Goal (31-90 days)  Patient will have any readmissions for  Congestive Heart Failure within the next 90 days  THN Long Term Goal Start Date  11/29/16  Interventions for Problem One Long Term Goal  RN reminded patient to keep appointments with primary medical Dr and cardiologist. RN reminded patient the importance of taking medication as per physician order. RN will follow up patient monthly  THN CM Short Term Goal #4 (0-30 days)  Patient will be able to verbalize foods low in sodium within the next 30 days  THN CM Short Term Goal #4 Start Date  11/29/16  Interventions for Short Term Goal #4  RN sent pictures chart of foods low in sodium. RN sent EMMI edcuational material on low sodium diet. RN sent educational material on dash diet with samples. RN will  follow up with discussion and teach back.      Assessment:  Patient is having ICD battery changed 12/06/2016 Patient will continue to benefit from [place] telephonic outreach for education and support for CHF self management. Plan:  RN allowed patient to allay feelings on ICD battery change RN discussed eating healthy RN discussed overall maintenance care RN will send patient educational information on Atrial Fib RN will follow up within the month of May  Ranette Luckadoo [place] Management [phone]

## 2016-11-29 NOTE — Progress Notes (Signed)
Patient had inquired this morning when she had her pre- procedure lab work done, if she could have something for anxiety the night before and morning of her procedure.   Per Dr. Caryl Comes- ok for Xanax 0.25 mg the night prior, but none the morning of as she will need to sign consent for her procedure- he will give her something through her IV the morning of after consent is signed.  The patient and her daughter are aware.

## 2016-11-30 LAB — CBC WITH DIFFERENTIAL/PLATELET
BASOS ABS: 0 10*3/uL (ref 0.0–0.2)
Basos: 0 %
EOS (ABSOLUTE): 0.1 10*3/uL (ref 0.0–0.4)
Eos: 1 %
Hematocrit: 45.5 % (ref 34.0–46.6)
Hemoglobin: 15.3 g/dL (ref 11.1–15.9)
IMMATURE GRANULOCYTES: 0 %
Immature Grans (Abs): 0 10*3/uL (ref 0.0–0.1)
Lymphocytes Absolute: 1.6 10*3/uL (ref 0.7–3.1)
Lymphs: 18 %
MCH: 30.1 pg (ref 26.6–33.0)
MCHC: 33.6 g/dL (ref 31.5–35.7)
MCV: 90 fL (ref 79–97)
MONOS ABS: 0.6 10*3/uL (ref 0.1–0.9)
Monocytes: 6 %
NEUTROS ABS: 6.7 10*3/uL (ref 1.4–7.0)
NEUTROS PCT: 75 %
PLATELETS: 191 10*3/uL (ref 150–379)
RBC: 5.08 x10E6/uL (ref 3.77–5.28)
RDW: 14.3 % (ref 12.3–15.4)
WBC: 9 10*3/uL (ref 3.4–10.8)

## 2016-11-30 LAB — BASIC METABOLIC PANEL
BUN / CREAT RATIO: 29 — AB (ref 12–28)
BUN: 22 mg/dL (ref 8–27)
CALCIUM: 9.7 mg/dL (ref 8.7–10.3)
CHLORIDE: 101 mmol/L (ref 96–106)
CO2: 22 mmol/L (ref 18–29)
Creatinine, Ser: 0.75 mg/dL (ref 0.57–1.00)
GFR calc Af Amer: 84 mL/min/{1.73_m2} (ref >59)
GFR calc non Af Amer: 73 mL/min/{1.73_m2} (ref >59)
Glucose: 107 mg/dL — ABNORMAL HIGH (ref 65–99)
Potassium: 4.5 mmol/L (ref 3.5–5.2)
Sodium: 144 mmol/L (ref 134–144)

## 2016-12-06 ENCOUNTER — Ambulatory Visit (HOSPITAL_COMMUNITY)
Admission: RE | Admit: 2016-12-06 | Discharge: 2016-12-06 | Disposition: A | Discharge status: Home / Self Care | Payer: Medicare Other | Source: Ambulatory Visit | Attending: Internal Medicine | Admitting: Internal Medicine

## 2016-12-06 ENCOUNTER — Encounter (HOSPITAL_COMMUNITY)
Admission: RE | Disposition: A | Discharge status: Home / Self Care | Payer: Self-pay | Source: Ambulatory Visit | Attending: Internal Medicine

## 2016-12-06 DIAGNOSIS — R55 Syncope and collapse: Secondary | ICD-10-CM | POA: Diagnosis not present

## 2016-12-06 DIAGNOSIS — Z7982 Long term (current) use of aspirin: Secondary | ICD-10-CM | POA: Diagnosis not present

## 2016-12-06 DIAGNOSIS — I482 Chronic atrial fibrillation: Secondary | ICD-10-CM | POA: Diagnosis not present

## 2016-12-06 DIAGNOSIS — I5022 Chronic systolic (congestive) heart failure: Secondary | ICD-10-CM | POA: Insufficient documentation

## 2016-12-06 DIAGNOSIS — Z8673 Personal history of transient ischemic attack (TIA), and cerebral infarction without residual deficits: Secondary | ICD-10-CM | POA: Insufficient documentation

## 2016-12-06 DIAGNOSIS — I255 Ischemic cardiomyopathy: Secondary | ICD-10-CM | POA: Insufficient documentation

## 2016-12-06 DIAGNOSIS — Z4502 Encounter for adjustment and management of automatic implantable cardiac defibrillator: Secondary | ICD-10-CM | POA: Diagnosis not present

## 2016-12-06 DIAGNOSIS — I2510 Atherosclerotic heart disease of native coronary artery without angina pectoris: Secondary | ICD-10-CM | POA: Diagnosis not present

## 2016-12-06 DIAGNOSIS — Z95810 Presence of automatic (implantable) cardiac defibrillator: Secondary | ICD-10-CM | POA: Diagnosis present

## 2016-12-06 DIAGNOSIS — Z7901 Long term (current) use of anticoagulants: Secondary | ICD-10-CM | POA: Diagnosis not present

## 2016-12-06 DIAGNOSIS — I428 Other cardiomyopathies: Secondary | ICD-10-CM | POA: Diagnosis not present

## 2016-12-06 DIAGNOSIS — E785 Hyperlipidemia, unspecified: Secondary | ICD-10-CM | POA: Insufficient documentation

## 2016-12-06 DIAGNOSIS — Z01812 Encounter for preprocedural laboratory examination: Secondary | ICD-10-CM

## 2016-12-06 DIAGNOSIS — M1990 Unspecified osteoarthritis, unspecified site: Secondary | ICD-10-CM | POA: Diagnosis not present

## 2016-12-06 HISTORY — PX: ICD GENERATOR CHANGEOUT: EP1231

## 2016-12-06 LAB — SURGICAL PCR SCREEN
MRSA, PCR: NEGATIVE
Staphylococcus aureus: POSITIVE — AB

## 2016-12-06 SURGERY — ICD GENERATOR CHANGEOUT
Anesthesia: LOCAL

## 2016-12-06 MED ORDER — FENTANYL CITRATE (PF) 100 MCG/2ML IJ SOLN
INTRAMUSCULAR | Status: DC | PRN
  Administered 2016-12-06 (×2): 25 ug via INTRAVENOUS

## 2016-12-06 MED ORDER — MUPIROCIN 2 % EX OINT
TOPICAL_OINTMENT | CUTANEOUS | Status: AC
  Administered 2016-12-06: 1 via NASAL
  Filled 2016-12-06: qty 22

## 2016-12-06 MED ORDER — LIDOCAINE HCL (PF) 1 % IJ SOLN
INTRAMUSCULAR | Status: DC | PRN
  Administered 2016-12-06: 53 mL

## 2016-12-06 MED ORDER — MUPIROCIN 2 % EX OINT
TOPICAL_OINTMENT | Freq: Once | CUTANEOUS | Status: AC
  Administered 2016-12-06: 1 via NASAL

## 2016-12-06 MED ORDER — SODIUM CHLORIDE 0.9 % IV SOLN
INTRAVENOUS | Status: DC
  Administered 2016-12-06: 11:00:00 via INTRAVENOUS

## 2016-12-06 MED ORDER — SODIUM CHLORIDE 0.9 % IV SOLN: INTRAVENOUS | Status: AC

## 2016-12-06 MED ORDER — SODIUM CHLORIDE 0.9 % IR SOLN
Status: AC
  Filled 2016-12-06: qty 2

## 2016-12-06 MED ORDER — CEFAZOLIN SODIUM-DEXTROSE 2-4 GM/100ML-% IV SOLN
INTRAVENOUS | Status: AC
  Filled 2016-12-06: qty 100

## 2016-12-06 MED ORDER — SODIUM CHLORIDE 0.9 % IR SOLN
Status: DC | PRN
  Administered 2016-12-06: 13:00:00

## 2016-12-06 MED ORDER — FENTANYL CITRATE (PF) 100 MCG/2ML IJ SOLN
INTRAMUSCULAR | Status: AC
  Filled 2016-12-06: qty 2

## 2016-12-06 MED ORDER — SODIUM CHLORIDE 0.9 % IR SOLN: 80.0000 mg | Status: DC

## 2016-12-06 MED ORDER — ONDANSETRON HCL 4 MG/2ML IJ SOLN
4.0000 mg | Freq: Four times a day (QID) | INTRAMUSCULAR | Status: DC | PRN
Start: 2016-12-06 — End: 2016-12-06

## 2016-12-06 MED ORDER — MIDAZOLAM HCL 5 MG/5ML IJ SOLN
INTRAMUSCULAR | Status: AC
  Filled 2016-12-06: qty 5

## 2016-12-06 MED ORDER — MIDAZOLAM HCL 5 MG/ML IJ SOLN
INTRAMUSCULAR | Status: DC | PRN
  Administered 2016-12-06 (×2): 1 mg via INTRAVENOUS

## 2016-12-06 MED ORDER — CHLORHEXIDINE GLUCONATE 4 % EX LIQD: 60.0000 mL | Freq: Once | CUTANEOUS | Status: DC

## 2016-12-06 MED ORDER — ACETAMINOPHEN 325 MG PO TABS: 325.0000 mg | ORAL_TABLET | ORAL | Status: DC | PRN

## 2016-12-06 MED ORDER — CEFAZOLIN SODIUM-DEXTROSE 2-4 GM/100ML-% IV SOLN
2.0000 g | INTRAVENOUS | Status: AC
  Administered 2016-12-06: 2 g via INTRAVENOUS
  Filled 2016-12-06: qty 100

## 2016-12-06 MED ORDER — LIDOCAINE HCL (PF) 1 % IJ SOLN
INTRAMUSCULAR | Status: AC
  Filled 2016-12-06: qty 60

## 2016-12-06 SURGICAL SUPPLY — 7 items
CABLE SURGICAL S-101-97-12 (CABLE) ×1 IMPLANT
DEVICE DISSECT PLASMABLAD 3.0S (MISCELLANEOUS) IMPLANT
HEMOSTAT SURGICEL 2X4 FIBR (HEMOSTASIS) ×2 IMPLANT
ICD EVERA XT DR DDBB1D1 (ICD Generator) ×1 IMPLANT
PAD DEFIB LIFELINK (PAD) ×1 IMPLANT
PLASMABLADE 3.0S (MISCELLANEOUS) ×2
TRAY PACEMAKER INSERTION (CUSTOM PROCEDURE TRAY) ×1 IMPLANT

## 2016-12-06 NOTE — H&P (View-Only) (Signed)
Patient Care Team: Kathrine Haddock, NP as PCP - General (Nurse Practitioner) Wende Bushy, MD as Consulting Physician (Cardiology) Winfield Rast, DC as Referring Physician (Chiropractic Medicine) Deboraha Sprang, MD as Consulting Physician (Cardiology) Verlin Grills, RN as [place] [facility]   HPI  Barbara Blackburn is a 83 y.o. female Seen in followup for a dual chamber ICD implanted for syncope in the setting of  Nonischemic and  ischemic cardiomyopathy with prior STENTING  and congestive heart failure.  Her ejection fraction had been noted to be 20%  Myoview per Rob Hick[facility]) notes 2/14>>EF 50% w inferior scar  She had  received a defibrillator with a 6949-lead At  Device generator replacement in 2012 she underwent "pirating" of her rate sense pacing lead and capping of the rate sense portion of her ICD lead   At last visit she was complaining of fatigue and weakness decreased her beta blocker.  This was no better on lower dose beta blockers  She has struggled with orthostatic intolerance none of late  She has hx of Atrial fibrillation now permanent, with prior stroke>> now on Rivaroxaban without bleeding issues  Past Medical History:  Diagnosis Date  . 6949-lead 11/11/2013  . Allergy   . CHF (congestive heart failure) (HCC)    class 2  . Chronic atrial fibrillation ([place])   . Coronary artery disease   . Degenerative joint disease    severe  . Dyslipidemia   . Hyperlipidemia   . ICD-Medtronic 05/10/2009   Qualifier: Diagnosis of  By: Lovena Le, MD, [facility], Binnie Kand   . Nonischemic cardiomyopathy [facility])     Past Surgical History:  Procedure Laterality Date  . Medtronic dual-chamber ICD  01/05/2006   generator change April 2012  . PCI stent    . REPLACEMENT TOTAL KNEE      Current Outpatient Prescriptions  Medication Sig Dispense Refill  . amLODipine (NORVASC) 5 MG tablet TAKE 1 TABLET (5 MG TOTAL) BY MOUTH DAILY. 30 tablet 11  . aspirin EC  81 MG tablet Take 1 tablet (81 mg total) by mouth daily. 90 tablet 3  . CRESTOR 20 MG tablet TAKE 1/2 TAB DAILY 30 tablet 5  . furosemide (LASIX) 40 MG tablet Take 40 mg by mouth daily.     [name] ibuprofen (ADVIL,MOTRIN) 400 MG tablet Take 400 mg by mouth 3 (three) times daily.    [name] losartan (COZAAR) 50 MG tablet TAKE 1 TABLET BY MOUTH EVERY DAY 30 tablet 6  . metoprolol succinate (TOPROL-XL) 50 MG 24 hr tablet TAKE 1 TABLET (50 MG TOTAL) BY MOUTH DAILY. TAKE WITH OR IMMEDIATELY FOLLOWING A MEAL. 30 tablet 6  . potassium bicarbonate (KLOR-CON/EF) 25 MEQ disintegrating tablet Take 1 tablet (25 mEq total) by mouth daily. (Patient taking differently: Take 12.5 mEq by mouth daily. ) 90 tablet 3  . Probiotic Product (ALIGN) 4 MG CAPS Take by mouth daily.    . sertraline (ZOLOFT) 50 MG tablet Take 1 tablet (50 mg total) by mouth daily. 30 tablet 1  . XARELTO 20 MG TABS tablet TAKE 1 TABLET (20 MG TOTAL) BY MOUTH DAILY WITH SUPPER. 30 tablet 5   No current facility-administered medications for this visit.     Allergies  Allergen Reactions  . Clonazepam   . Fluoxetine Hcl Other (See Comments)    nightmares  . Lisinopril   . Zolpidem Tartrate Other (See Comments)    Ambien    Review of Systems  negative except from HPI and PMH  Physical Exam BP (!) 146/72 (BP Location: Left Arm, Patient Position: Sitting, Cuff Size: Normal)   Pulse 62   Ht 5\' 1"  (1.549 m)   Wt 181 lb 8 oz (82.3 kg)   LMP  (LMP Unknown)   BMI 34.29 kg/m  Well developed and nourished in no acute distress HENT normal Neck supple with JVP 6-8 Clear Device pocket well healed; without hematoma or erythema.  There is no tethering  all other etiology and her  Regular rate and rhythm, no murmurs or gallops Abd-soft with active BS No Clubbing cyanosis edema Skin-warm and dry A & Oriented  Grossly normal sensory and motor function she is sitting in a wheelchair   ECG demonstrates atrial fibrillation 62 Intervals-/09/44 T-wave  inversions inferiorly and V4-6  Assessment and  Plan  Nonischemic cardiomyopathy  Atrial fibrillation permanent  Congestive heart failure-chronic systolic  Syncope  ICD for secondary prevention  6949-lead with capping of the rate sense portion  The patient's device was interrogated.  The information was reviewed. No changes were made in the programming.    Orthostatic intolerance    Her device has reached ERI. Given her age, we've discussed whether we replace with high-voltage or low voltage. She ventricularly paces 33% of the time.   Given his fatigue, we will discontinue the metoprolol altogether and see how she feels.  Echocardiogram last year demonstrated normal LV function despite ventricular pacing greater than 20%. No interval syncope  On Anticoagulation;  No bleeding issues   More than 50% of 45 min was spent in counseling related to the above

## 2016-12-06 NOTE — Discharge Instructions (Signed)

## 2016-12-06 NOTE — Interval H&P Note (Signed)
ICD Criteria  Current LVEF:50%. Within 12 months prior to implant: No   Heart failure history: Yes, Class II  Cardiomyopathy history: Yes, Mixed Ischemic and Non-Ischemic Cardiomyopathies.  Atrial Fibrillation/Atrial Flutter: Yes, Permanent.  Ventricular tachycardia history: Yes, Hemodynamic instability present. VT Type is unknown.  Cardiac arrest history: No.  History of syndromes with risk of sudden death: No.  Previous ICD: Yes, Reason for ICD:  Secondary prevention.  Current ICD indication: Secondary  PPM indication: No.   Class I or II Bradycardia indication present: No  Beta Blocker therapy for 3 or more months: Yes, prescribed.   Ace Inhibitor/ARB therapy for 3 or more months: Yes, prescribed.   History and Physical Interval Note:  12/06/2016 11:33 AM  Barbara Blackburn  has presented today for surgery, with the diagnosis of ERI  The various methods of treatment have been discussed with the patient and family. After consideration of risks, benefits and other options for treatment, the patient has consented to  Procedure(s): ICD [employer] (N/A) as a surgical intervention .  The patient's history has been reviewed, patient examined, no change in status, stable for surgery.  I have reviewed the patient's chart and labs.  Questions were answered to the patient's satisfaction.     Virl Axe

## 2016-12-07 ENCOUNTER — Encounter (HOSPITAL_COMMUNITY): Payer: Self-pay | Admitting: Internal Medicine

## 2016-12-13 ENCOUNTER — Telehealth: Payer: Self-pay | Admitting: Internal Medicine

## 2016-12-13 DIAGNOSIS — C4491 Basal cell carcinoma of skin, unspecified: Secondary | ICD-10-CM | POA: Diagnosis not present

## 2016-12-13 NOTE — Telephone Encounter (Signed)
Information for implant sheet provided.

## 2016-12-13 NOTE — Telephone Encounter (Signed)
New message    Coralyn Mark from [employer] is calling to find out what kind of leads were implanted with the ICD.

## 2016-12-13 NOTE — Telephone Encounter (Signed)
To Device Clinic.  

## 2016-12-21 ENCOUNTER — Ambulatory Visit (INDEPENDENT_AMBULATORY_CARE_PROVIDER_SITE_OTHER): Payer: Medicare Other | Admitting: *Deleted

## 2016-12-21 DIAGNOSIS — I5022 Chronic systolic (congestive) heart failure: Secondary | ICD-10-CM

## 2016-12-21 DIAGNOSIS — I428 Other cardiomyopathies: Secondary | ICD-10-CM

## 2016-12-21 LAB — CUP PACEART INCLINIC DEVICE CHECK
Battery Remaining Longevity: 131 mo
Battery Voltage: 3.12 V
Brady Statistic AP VP Percent: 0 %
Brady Statistic AS VP Percent: 8.24 %
Brady Statistic RA Percent Paced: 0 %
Brady Statistic RV Percent Paced: 10.29 %
HIGH POWER IMPEDANCE MEASURED VALUE: 43 Ohm
HighPow Impedance: 60 Ohm
Implantable Lead Implant Date: 20070511
Implantable Lead Implant Date: 20070511
Implantable Lead Location: 753859
Implantable Lead Location: 753860
Implantable Lead Model: 5076
Implantable Lead Model: 5092
Implantable Pulse Generator Implant Date: 20180411
Lead Channel Impedance Value: 513 Ohm
Lead Channel Pacing Threshold Amplitude: 0.5 V
Lead Channel Sensing Intrinsic Amplitude: 0.375 mV
Lead Channel Sensing Intrinsic Amplitude: 1.75 mV
Lead Channel Sensing Intrinsic Amplitude: 2.25 mV
Lead Channel Setting Pacing Pulse Width: 0.4 ms
Lead Channel Setting Sensing Sensitivity: 0.3 mV
MDC IDC LEAD IMPLANT DT: 20070511
MDC IDC LEAD LOCATION: 753860
MDC IDC MSMT LEADCHNL RA IMPEDANCE VALUE: 399 Ohm
MDC IDC MSMT LEADCHNL RA SENSING INTR AMPL: 0.25 mV
MDC IDC MSMT LEADCHNL RV IMPEDANCE VALUE: 399 Ohm
MDC IDC MSMT LEADCHNL RV PACING THRESHOLD PULSEWIDTH: 0.4 ms
MDC IDC SESS DTM: 20180426103539
MDC IDC SET LEADCHNL RV PACING AMPLITUDE: 2.5 V
MDC IDC STAT BRADY AP VS PERCENT: 0 %
MDC IDC STAT BRADY AS VS PERCENT: 91.76 %

## 2016-12-21 NOTE — Progress Notes (Signed)
Wound check appointment. Dermabond removed. Wound without redness or edema. Incision edges approximated, wound well healed. Normal device function. Threshold, sensing, and impedances consistent with implant measurements. Device programmed at chronic output values. Histogram distribution appropriate for patient and level of activity. 100% AT/AF + Xarelto. No ventricular arrhythmias noted. Patient educated about wound care, arm mobility, shock plan. ROV 02/2017 w/ SK in [place]

## 2016-12-26 ENCOUNTER — Other Ambulatory Visit: Payer: Self-pay | Admitting: Internal Medicine

## 2016-12-29 ENCOUNTER — Other Ambulatory Visit: Payer: Self-pay | Admitting: *Deleted

## 2016-12-29 NOTE — Patient Outreach (Signed)
[facility]) Care Management  12/29/2016   Barbara Blackburn 1928-11-12 [id number]    RN Health Coach telephone call to patient.  Hipaa compliance verified. Per patient caregiver Benjie Karvonen. Patient is doing real good. Patient is in the green zone. Patient had battery changed in ICD. After surgery patient had 2 area on head of basal cell carcinoma removed. Patient has lost 5 pounds. Patient is weighing every other day. Patient is taking medications as prescribed. Per caregiver if patient sees increase in fluid and weight she takes the additional 1/2 of diuretic as per ordered.  Patient has been going  outside. Patient has agreed to follow up outreach calls.   Current Medications:  Current Outpatient Prescriptions  Medication Sig Dispense Refill  . ALPRAZolam (XANAX) 0.25 MG tablet Take one tablet (0.25 mg) by mouth at bedtime the night prior to your procedure 1 tablet 0  . amLODipine (NORVASC) 5 MG tablet TAKE 1 TABLET (5 MG TOTAL) BY MOUTH DAILY. 30 tablet 11  . diphenhydrAMINE (BENADRYL) 25 MG tablet Take 25 mg by mouth at bedtime as needed for allergies or sleep.    . furosemide (LASIX) 40 MG tablet Take 40 mg by mouth daily with lunch.     . ibuprofen (ADVIL,MOTRIN) 200 MG tablet Take 400 mg by mouth daily as needed for moderate pain.    [name] losartan (COZAAR) 50 MG tablet TAKE 1 TABLET BY MOUTH EVERY DAY 30 tablet 3  . potassium bicarbonate (KLOR-CON/EF) 25 MEQ disintegrating tablet Take 1 tablet (25 mEq total) by mouth daily. (Patient taking differently: Take 12.5 mEq by mouth daily. ) 90 tablet 3  . Probiotic Product (ALIGN) 4 MG CAPS Take 4 mg by mouth daily.     . rosuvastatin (CRESTOR) 20 MG tablet TAKE 1/2 TABLET DAILY (Patient taking differently: Take 10mg  by mouth daily at lunch) 30 tablet 2  . sertraline (ZOLOFT) 50 MG tablet TAKE 1 TABLET (50 MG TOTAL) BY MOUTH DAILY. (Patient taking differently: Take 25 mg by mouth daily. ) 30 tablet 1  . XARELTO 20 MG TABS tablet TAKE  1 TABLET (20 MG TOTAL) BY MOUTH DAILY WITH SUPPER. (Patient taking differently: Take 20 mg by mouth daily with lunch. ) 30 tablet 5   No current facility-administered medications for this visit.     Functional Status:  In your present state of health, do you have any difficulty performing the following activities: 12/29/2016 12/06/2016  Hearing? Y N  Vision? N N  Difficulty concentrating or making decisions? N N  Walking or climbing stairs? Y Y  Dressing or bathing? N N  Doing errands, shopping? Y -  Conservation officer, nature and eating ? Y -  Using the Toilet? N -  In the past six months, have you accidently leaked urine? N -  Do you have problems with loss of bowel control? N -  Managing your Medications? Y -  Managing your Finances? N -  Housekeeping or managing your Housekeeping? Y -  Some recent data might be hidden    Fall/Depression Screening: Fall Risk  12/29/2016 11/29/2016 08/31/2016  Falls in the past year? Yes Yes Yes  Number falls in past yr: 1 1 1   Injury with Fall? No No No  Risk Factor Category  - - -  Risk for fall due to : Impaired balance/gait;Impaired mobility Impaired balance/gait;Impaired mobility Impaired balance/gait;Impaired mobility  Follow up Falls evaluation completed;Education provided;Falls prevention discussed Falls evaluation completed;Education provided;Falls prevention discussed Education provided;Falls evaluation completed;Falls prevention discussed  PHQ 2/9 Scores 12/29/2016 11/29/2016 08/31/2016 07/18/2016 06/13/2016 03/13/2016 03/03/2016  PHQ - 2 Score 0 0 1 1 1 1 1   PHQ- 9 Score - - - - - - -   THN CM Care Plan Problem One     Most Recent Value  Care Plan Problem One  Knowledge Deficit in Self Management of Congestive Heart Failure  Role Documenting the Problem One  Health Coach  THN Long Term Goal (31-90 days)  Patient will have any readmissions for Congestive Heart Failure within the next 90 days  THN Long Term Goal Start Date  12/29/16  THN CM Short Term Goal  #4 (0-30 days)  Patient will be able to verbalize foods low in sodium within the next 30 days  THN CM Short Term Goal #4 Start Date  12/29/16  Interventions for Short Term Goal #4  RN sent pictures chart of foods low in sodium. RN sent EMMI edcuational material on low sodium diet. RN sent educational material on dash diet with samples. RN will follow up with discussion and teach back.      Assessment:  Patient had ICD battery changed Patient had 2 areas of basal cell ca removed from head Patient has lost 5 pounds Patient weighs every other day Patient will continue to benefit from [place] telephonic outreach for education and support for CHF self management.  Plan:  RN dicussed weighing RN discussed medication adherence RN discussed action plan for CHF zone RN will follow up within the month of June  Luara Faye [place] [facility] [phone]

## 2017-01-10 DIAGNOSIS — C4491 Basal cell carcinoma of skin, unspecified: Secondary | ICD-10-CM | POA: Diagnosis not present

## 2017-01-12 DIAGNOSIS — H2513 Age-related nuclear cataract, bilateral: Secondary | ICD-10-CM | POA: Diagnosis not present

## 2017-01-12 DIAGNOSIS — E119 Type 2 diabetes mellitus without complications: Secondary | ICD-10-CM | POA: Diagnosis not present

## 2017-01-12 LAB — HM DIABETES EYE EXAM

## 2017-01-18 ENCOUNTER — Other Ambulatory Visit: Payer: Self-pay | Admitting: Unknown Physician Specialty

## 2017-01-18 DIAGNOSIS — E876 Hypokalemia: Secondary | ICD-10-CM

## 2017-01-24 DIAGNOSIS — C4441 Basal cell carcinoma of skin of scalp and neck: Secondary | ICD-10-CM | POA: Diagnosis not present

## 2017-01-24 DIAGNOSIS — C44319 Basal cell carcinoma of skin of other parts of face: Secondary | ICD-10-CM | POA: Diagnosis not present

## 2017-01-29 ENCOUNTER — Ambulatory Visit: Payer: Self-pay | Admitting: *Deleted

## 2017-02-06 ENCOUNTER — Other Ambulatory Visit: Payer: Self-pay | Admitting: *Deleted

## 2017-02-06 ENCOUNTER — Ambulatory Visit: Payer: Self-pay | Admitting: *Deleted

## 2017-02-06 NOTE — Patient Outreach (Signed)
[place] [facility]) Care Management  02/06/2017  Barbara Blackburn [birth year] [id number]  RN Health Coach  attempted #1 Follow up outreach call to patient.  Patient was unavailable. HIPPA compliance voicemail message was left with return callback number.   Plan: RN will call patient again within 14 days.  [place] Care Management [phone]

## 2017-02-08 ENCOUNTER — Telehealth: Payer: Self-pay | Admitting: Unknown Physician Specialty

## 2017-02-08 ENCOUNTER — Other Ambulatory Visit: Payer: Self-pay | Admitting: *Deleted

## 2017-02-08 ENCOUNTER — Other Ambulatory Visit: Payer: Self-pay

## 2017-02-08 DIAGNOSIS — E876 Hypokalemia: Secondary | ICD-10-CM

## 2017-02-08 DIAGNOSIS — I5022 Chronic systolic (congestive) heart failure: Secondary | ICD-10-CM

## 2017-02-08 MED ORDER — AMLODIPINE BESYLATE 5 MG PO TABS: ORAL_TABLET | ORAL | 3 refills | Status: DC

## 2017-02-08 MED ORDER — LOSARTAN POTASSIUM 50 MG PO TABS: 50.0000 mg | ORAL_TABLET | Freq: Every day | ORAL | 3 refills | Status: DC

## 2017-02-08 MED ORDER — FUROSEMIDE 40 MG PO TABS: 40.0000 mg | ORAL_TABLET | Freq: Every day | ORAL | 3 refills | Status: DC

## 2017-02-08 MED ORDER — RIVAROXABAN 20 MG PO TABS: 20.0000 mg | ORAL_TABLET | Freq: Every day | ORAL | 3 refills | Status: DC

## 2017-02-08 NOTE — Telephone Encounter (Signed)
Called pt to schedule Annual Wellness Visit with [facility]  For July - knb

## 2017-02-12 ENCOUNTER — Other Ambulatory Visit: Payer: Self-pay | Admitting: Unknown Physician Specialty

## 2017-02-13 ENCOUNTER — Other Ambulatory Visit: Payer: Self-pay

## 2017-02-13 DIAGNOSIS — E876 Hypokalemia: Secondary | ICD-10-CM

## 2017-02-13 MED ORDER — SERTRALINE HCL 50 MG PO TABS: 50.0000 mg | ORAL_TABLET | Freq: Every day | ORAL | 1 refills | Status: DC

## 2017-02-13 MED ORDER — POTASSIUM BICARBONATE 25 MEQ PO TBEF: 25.0000 meq | EFFERVESCENT_TABLET | Freq: Every day | ORAL | 0 refills | Status: DC

## 2017-02-13 MED ORDER — ROSUVASTATIN CALCIUM 20 MG PO TABS: 10.0000 mg | ORAL_TABLET | Freq: Every day | ORAL | 2 refills | Status: DC

## 2017-02-20 ENCOUNTER — Other Ambulatory Visit: Payer: Self-pay | Admitting: *Deleted

## 2017-02-20 NOTE — Patient Outreach (Signed)
[facility]) Care Management  02/20/2017   Barbara Blackburn Jun 16, 1929 [id number]  RN Health Coach telephone call to patient.  Hipaa compliance verified. Per patient she is doing pretty good. Patient is having some mild discomfort around the ICD site. No redness or swelling. Patient elevates legs in lift chair when sitting so she doesn't have swelling in her lower extremities.  Patient weight was 164.5 and her BP was 148/95. Patient is hypertensive. Patient appetite is good.  Per patient she exercises by walking 4 laps around the garage  4 times a week. She also has a pedaling machine that she uses. Patient has difficulty with some short term memory. RN reiterated the zones of congestive heart failure. Patient was unable to remember. Patient has agreed to follow up outreach calls.    Current Medications:  Current Outpatient Prescriptions  Medication Sig Dispense Refill  . ALPRAZolam (XANAX) 0.25 MG tablet Take one tablet (0.25 mg) by mouth at bedtime the night prior to your procedure 1 tablet 0  . amLODipine (NORVASC) 5 MG tablet TAKE 1 TABLET (5 MG TOTAL) BY MOUTH DAILY. 90 tablet 3  . diphenhydrAMINE (BENADRYL) 25 MG tablet Take 25 mg by mouth at bedtime as needed for allergies or sleep.    . furosemide (LASIX) 40 MG tablet Take 1 tablet (40 mg total) by mouth daily with lunch. 90 tablet 3  . ibuprofen (ADVIL,MOTRIN) 200 MG tablet Take 400 mg by mouth daily as needed for moderate pain.    [name] losartan (COZAAR) 50 MG tablet Take 1 tablet (50 mg total) by mouth daily. 90 tablet 3  . potassium bicarbonate (KLOR-CON/EF) 25 MEQ disintegrating tablet Take 1 tablet (25 mEq total) by mouth daily. 90 tablet 0  . Probiotic Product (ALIGN) 4 MG CAPS Take 4 mg by mouth daily.     . rivaroxaban (XARELTO) 20 MG TABS tablet Take 1 tablet (20 mg total) by mouth daily with supper. 90 tablet 3  . rosuvastatin (CRESTOR) 20 MG tablet Take 0.5 tablets (10 mg total) by mouth daily. 30 tablet 2   . sertraline (ZOLOFT) 50 MG tablet Take 1 tablet (50 mg total) by mouth daily. 30 tablet 1   No current facility-administered medications for this visit.     Functional Status:  In your present state of health, do you have any difficulty performing the following activities: 02/20/2017 12/29/2016  Hearing? Y Y  Vision? - N  Difficulty concentrating or making decisions? N N  Walking or climbing stairs? Y Y  Dressing or bathing? N N  Doing errands, shopping? Tempie Donning  Preparing Food and eating ? Y Y  Using the Toilet? N N  In the past six months, have you accidently leaked urine? N N  Do you have problems with loss of bowel control? N N  Managing your Medications? Y Y  Managing your Finances? N N  Housekeeping or managing your Housekeeping? Y Y  Some recent data might be hidden    Fall/Depression Screening: Fall Risk  02/20/2017 12/29/2016 11/29/2016  Falls in the past year? Yes Yes Yes  Number falls in past yr: 1 1 1   Injury with Fall? No No No  Risk Factor Category  - - -  Risk for fall due to : Impaired balance/gait;Impaired mobility Impaired balance/gait;Impaired mobility Impaired balance/gait;Impaired mobility  Follow up Falls evaluation completed;Education provided;Falls prevention discussed Falls evaluation completed;Education provided;Falls prevention discussed Falls evaluation completed;Education provided;Falls prevention discussed   PHQ 2/9 Scores 02/20/2017 12/29/2016 11/29/2016 08/31/2016  07/18/2016 06/13/2016 03/13/2016  PHQ - 2 Score 0 0 0 1 1 1 1   PHQ- 9 Score - - - - - - -   THN CM Care Plan Problem One     Most Recent Value  Care Plan Problem One  Knowledge Deficit in Self Management of Congestive Heart Failure  Role Documenting the Problem One  [place] for Problem One  Active  THN Long Term Goal   Patient will have any readmissions for Congestive Heart Failure within the next 90 days  THN Long Term Goal Start Date  02/20/17  Interventions for Problem One Long  Term Goal  RN reminded patient to keep appointments with primary medical Dr and cardiologist. RN reminded patient the importance of taking medication as per physician order. RN will follow up patient monthly  THN CM Short Term Goal #1   patient will be able to verbalize Zones of Congestive Heart Failure  THN CM Short Term Goal #1 Start Date  02/20/17  Interventions for Short Term Goal #1  RN reiterated the zones of congestive heart failure. Patient has some short term memory loss and this need to be reiterated each time. RN will follow up each outreach call,.  THN CM Short Term Goal #4  Patient will be able to verbalize foods low in sodium within the next 30 days  THN CM Short Term Goal #4 Start Date  02/20/17  Interventions for Short Term Goal #4  RN sent pictures chart of foods low in sodium. RN sent EMMI edcuational material on low sodium diet. RN sent educational material on dash diet with samples. RN will follow up with discussion and teach back.      Assessment:  Patient does have some short term memory loss Patient was a little hypertensive Patient appetite is good Patient is exercise with short walks  Plan:  RN discussed medication adherence RN discussed exercising RN reiterated the CHF zones RN will follow up outreach within the month of July  Angelgabriel Willmore [place] Management [phone]

## 2017-03-13 ENCOUNTER — Ambulatory Visit: Payer: Medicare Other | Admitting: Unknown Physician Specialty

## 2017-03-14 ENCOUNTER — Ambulatory Visit: Payer: Medicare Other | Admitting: Unknown Physician Specialty

## 2017-03-14 ENCOUNTER — Ambulatory Visit (INDEPENDENT_AMBULATORY_CARE_PROVIDER_SITE_OTHER): Payer: Medicare Other

## 2017-03-14 ENCOUNTER — Encounter: Payer: Self-pay | Admitting: Unknown Physician Specialty

## 2017-03-14 VITALS — BP 122/62 | HR 88 | Temp 98.2°F | Resp 18 | Ht 60.0 in | Wt 185.9 lb

## 2017-03-14 DIAGNOSIS — Z7189 Other specified counseling: Secondary | ICD-10-CM | POA: Diagnosis not present

## 2017-03-14 DIAGNOSIS — F0280 Dementia in other diseases classified elsewhere without behavioral disturbance: Secondary | ICD-10-CM

## 2017-03-14 DIAGNOSIS — Z0000 Encounter for general adult medical examination without abnormal findings: Secondary | ICD-10-CM

## 2017-03-14 DIAGNOSIS — R531 Weakness: Secondary | ICD-10-CM

## 2017-03-14 DIAGNOSIS — E7800 Pure hypercholesterolemia, unspecified: Secondary | ICD-10-CM | POA: Diagnosis not present

## 2017-03-14 DIAGNOSIS — G301 Alzheimer's disease with late onset: Principal | ICD-10-CM

## 2017-03-14 NOTE — Assessment & Plan Note (Signed)
A voluntary discussion about advance care planning including the explanation and discussion of advance directives was extensively discussed with the patient.  Explanation about the health care proxy and   During this discussion, the patient was able to identify a health care proxy as her daughter and plans not plan to fill out the paperwork required.  Patient was offered a separate Marie visit for further assistance with forms.

## 2017-03-14 NOTE — Patient Instructions (Addendum)
Barbara Blackburn , Thank you for taking time to come for your Medicare Wellness Visit. I appreciate your ongoing commitment to your health goals. Please review the following plan we discussed and let me know if I can assist you in the future.   Screening recommendations/referrals: Colonoscopy: completed 11/05/2008, no longer required Mammogram: no longer required Bone Density: completed 07/05/2010 Recommended yearly ophthalmology/optometry visit for glaucoma screening and checkup Recommended yearly dental visit for hygiene and checkup  Vaccinations: Influenza vaccine: up to date, due 06/2017 Pneumococcal vaccine: up to date Tdap vaccine: up to date Shingles vaccine: up to date   Advanced directives: copy on file, you requested no changes at this time   Conditions/risks identified: Recommend drinking at least 3-4 glasses of water a day  Next appointment: Follow up in one year for your annual wellness exam.    Preventive Care 81 Years and Older, Female Preventive care refers to lifestyle choices and visits with your health care provider that can promote health and wellness. What does preventive care include?  A yearly physical exam. This is also called an annual well check.  Dental exams once or twice a year.  Routine eye exams. Ask your health care provider how often you should have your eyes checked.  Personal lifestyle choices, including:  Daily care of your teeth and gums.  Regular physical activity.  Eating a healthy diet.  Avoiding tobacco and drug use.  Limiting alcohol use.  Practicing safe sex.  Taking low-dose aspirin every day.  Taking vitamin and mineral supplements as recommended by your health care provider. What happens during an annual well check? The services and screenings done by your health care provider during your annual well check will depend on your age, overall health, lifestyle risk factors, and family history of disease. Counseling  Your health  care provider may ask you questions about your:  Alcohol use.  Tobacco use.  Drug use.  Emotional well-being.  Home and relationship well-being.  Sexual activity.  Eating habits.  History of falls.  Memory and ability to understand (cognition).  Work and work Statistician.  Reproductive health. Screening  You may have the following tests or measurements:  Height, weight, and BMI.  Blood pressure.  Lipid and cholesterol levels. These may be checked every 5 years, or more frequently if you are over 21 years old.  Skin check.  Lung cancer screening. You may have this screening every year starting at age 81 if you have a 30-pack-year history of smoking and currently smoke or have quit within the past 15 years.  Fecal occult blood test (FOBT) of the stool. You may have this test every year starting at age 81.  Flexible sigmoidoscopy or colonoscopy. You may have a sigmoidoscopy every 5 years or a colonoscopy every 10 years starting at age 81.  Hepatitis C blood test.  Hepatitis B blood test.  Sexually transmitted disease (STD) testing.  Diabetes screening. This is done by checking your blood sugar (glucose) after you have not eaten for a while (fasting). You may have this done every 1-3 years.  Bone density scan. This is done to screen for osteoporosis. You may have this done starting at age 81.  Mammogram. This may be done every 1-2 years. Talk to your health care provider about how often you should have regular mammograms. Talk with your health care provider about your test results, treatment options, and if necessary, the need for more tests. Vaccines  Your health care provider may recommend certain vaccines,  such as:  Influenza vaccine. This is recommended every year.  Tetanus, diphtheria, and acellular pertussis (Tdap, Td) vaccine. You may need a Td booster every 10 years.  Zoster vaccine. You may need this after age 81.  Pneumococcal 13-valent conjugate  (PCV13) vaccine. One dose is recommended after age 81.  Pneumococcal polysaccharide (PPSV23) vaccine. One dose is recommended after age 81. Talk to your health care provider about which screenings and vaccines you need and how often you need them. This information is not intended to replace advice given to you by your health care provider. Make sure you discuss any questions you have with your health care provider. Document Released: 09/10/2015 Document Revised: 05/03/2016 Document Reviewed: 06/15/2015 Elsevier Interactive Patient Education  2017 [place] Prevention in the Home Falls can cause injuries. They can happen to people of all ages. There are many things you can do to make your home safe and to help prevent falls. What can I do on the outside of my home?  Regularly fix the edges of walkways and driveways and fix any cracks.  Remove anything that might make you trip as you walk through a door, such as a raised step or threshold.  Trim any bushes or trees on the path to your home.  Use bright outdoor lighting.  Clear any walking paths of anything that might make someone trip, such as rocks or tools.  Regularly check to see if handrails are loose or broken. Make sure that both sides of any steps have handrails.  Any raised decks and porches should have guardrails on the edges.  Have any leaves, snow, or ice cleared regularly.  Use sand or salt on walking paths during winter.  Clean up any spills in your garage right away. This includes oil or grease spills. What can I do in the bathroom?  Use night lights.  Install grab bars by the toilet and in the tub and shower. Do not use towel bars as grab bars.  Use non-skid mats or decals in the tub or shower.  If you need to sit down in the shower, use a plastic, non-slip stool.  Keep the floor dry. Clean up any water that spills on the floor as soon as it happens.  Remove soap buildup in the tub or shower  regularly.  Attach bath mats securely with double-sided non-slip rug tape.  Do not have throw rugs and other things on the floor that can make you trip. What can I do in the bedroom?  Use night lights.  Make sure that you have a light by your bed that is easy to reach.  Do not use any sheets or blankets that are too big for your bed. They should not hang down onto the floor.  Have a firm chair that has side arms. You can use this for support while you get dressed.  Do not have throw rugs and other things on the floor that can make you trip. What can I do in the kitchen?  Clean up any spills right away.  Avoid walking on wet floors.  Keep items that you use a lot in easy-to-reach places.  If you need to reach something above you, use a strong step stool that has a grab bar.  Keep electrical cords out of the way.  Do not use floor polish or wax that makes floors slippery. If you must use wax, use non-skid floor wax.  Do not have throw rugs and other things on  the floor that can make you trip. What can I do with my stairs?  Do not leave any items on the stairs.  Make sure that there are handrails on both sides of the stairs and use them. Fix handrails that are broken or loose. Make sure that handrails are as long as the stairways.  Check any carpeting to make sure that it is firmly attached to the stairs. Fix any carpet that is loose or worn.  Avoid having throw rugs at the top or bottom of the stairs. If you do have throw rugs, attach them to the floor with carpet tape.  Make sure that you have a light switch at the top of the stairs and the bottom of the stairs. If you do not have them, ask someone to add them for you. What else can I do to help prevent falls?  Wear shoes that:  Do not have high heels.  Have rubber bottoms.  Are comfortable and fit you well.  Are closed at the toe. Do not wear sandals.  If you use a stepladder:  Make sure that it is fully  opened. Do not climb a closed stepladder.  Make sure that both sides of the stepladder are locked into place.  Ask someone to hold it for you, if possible.  Clearly mark and make sure that you can see:  Any grab bars or handrails.  First and last steps.  Where the edge of each step is.  Use tools that help you move around (mobility aids) if they are needed. These include:  Canes.  Walkers.  Scooters.  Crutches.  Turn on the lights when you go into a dark area. Replace any light bulbs as soon as they burn out.  Set up your furniture so you have a clear path. Avoid moving your furniture around.  If any of your floors are uneven, fix them.  If there are any pets around you, be aware of where they are.  Review your medicines with your doctor. Some medicines can make you feel dizzy. This can increase your chance of falling. Ask your doctor what other things that you can do to help prevent falls. This information is not intended to replace advice given to you by your health care provider. Make sure you discuss any questions you have with your health care provider. Document Released: 06/10/2009 Document Revised: 01/20/2016 Document Reviewed: 09/18/2014 Elsevier Interactive Patient Education  2017 [employer].

## 2017-03-14 NOTE — Assessment & Plan Note (Signed)
Stable, continue present medications.  Consider trial off Crestor

## 2017-03-14 NOTE — Assessment & Plan Note (Signed)
Will check labs

## 2017-03-14 NOTE — Progress Notes (Signed)
Subjective:   Barbara Blackburn is a 83 y.o. female who presents for Medicare Annual (Subsequent) preventive examination.  Review of Systems:  Cardiac Risk Factors include: hypertension;dyslipidemia;advanced age (>55men, >21 women);obesity (BMI >30kg/m2)     Objective:     Vitals: BP 122/62 (BP Location: Left Arm, Patient Position: Sitting)   Pulse 88   Temp 98.2 F (36.8 C)   Resp 18   Ht 5' (1.524 m)   Wt 185 lb 14.4 oz (84.3 kg)   LMP  (LMP Unknown)   BMI 36.31 kg/m   Body mass index is 36.31 kg/m.   Tobacco History  Smoking Status  . Never Smoker  Smokeless Tobacco  . Never Used     Counseling given: Not Answered   Past Medical History:  Diagnosis Date  . 6949-lead 11/11/2013  . Allergy   . CHF (congestive heart failure) (HCC)    class 2  . Chronic atrial fibrillation ([place])   . Coronary artery disease   . Degenerative joint disease    severe  . Dyslipidemia   . Hyperlipidemia   . ICD-Medtronic 05/10/2009   Qualifier: Diagnosis of  By: Lovena Le, MD, [facility], Binnie Kand   . Nonischemic cardiomyopathy [facility])    Past Surgical History:  Procedure Laterality Date  . ICD GENERATOR CHANGEOUT N/A 12/06/2016   Procedure: ICD Generator Changeout;  Surgeon: Deboraha Sprang, MD;  Location: [facility];  Service: Cardiovascular;  Laterality: N/A;  . Medtronic dual-chamber ICD  01/05/2006   generator change April 2012  . PCI stent    . REPLACEMENT TOTAL KNEE     Family History  Problem Relation Age of Onset  . Heart attack Father   . Cancer Mother        colon  . Hypertension Sister   . Aneurysm Sister   . Aneurysm Maternal Grandmother   . Cancer Sister        rectal   History  Sexual Activity  . Sexual activity: No    Outpatient Encounter Prescriptions as of 03/14/2017  Medication Sig  . amLODipine (NORVASC) 5 MG tablet TAKE 1 TABLET (5 MG TOTAL) BY MOUTH DAILY.  . diphenhydrAMINE (BENADRYL) 25 MG tablet Take 25 mg by mouth at bedtime as needed for  allergies or sleep.  . furosemide (LASIX) 40 MG tablet Take 1 tablet (40 mg total) by mouth daily with lunch.  . losartan (COZAAR) 50 MG tablet Take 1 tablet (50 mg total) by mouth daily.  . meloxicam (MOBIC) 15 MG tablet Take 15 mg by mouth daily.  . potassium bicarbonate (KLOR-CON/EF) 25 MEQ disintegrating tablet Take 1 tablet (25 mEq total) by mouth daily.  . Probiotic Product (ALIGN) 4 MG CAPS Take 4 mg by mouth daily.   . rivaroxaban (XARELTO) 20 MG TABS tablet Take 1 tablet (20 mg total) by mouth daily with supper.  . rosuvastatin (CRESTOR) 20 MG tablet Take 0.5 tablets (10 mg total) by mouth daily.  . sertraline (ZOLOFT) 50 MG tablet Take 1 tablet (50 mg total) by mouth daily. (Patient taking differently: Take 50 mg by mouth daily. )  . [DISCONTINUED] ALPRAZolam (XANAX) 0.25 MG tablet Take one tablet (0.25 mg) by mouth at bedtime the night prior to your procedure (Patient not taking: Reported on 03/14/2017)  . [DISCONTINUED] ibuprofen (ADVIL,MOTRIN) 200 MG tablet Take 400 mg by mouth daily as needed for moderate pain.   No facility-administered encounter medications on file as of 03/14/2017.     Activities of Daily  Living In your present state of health, do you have any difficulty performing the following activities: 03/14/2017 02/20/2017  Hearing? Tempie Donning  Vision? N -  Difficulty concentrating or making decisions? Y N  Walking or climbing stairs? N Y  Dressing or bathing? N N  Doing errands, shopping? N Y  Conservation officer, nature and eating ? N Y  Using the Toilet? N N  In the past six months, have you accidently leaked urine? N N  Do you have problems with loss of bowel control? N N  Managing your Medications? N Y  Managing your Finances? N N  Housekeeping or managing your Housekeeping? N Y  Some recent data might be hidden    Patient Care Team: Kathrine Haddock, NP as PCP - General (Nurse Practitioner) Wende Bushy, MD as Consulting Physician (Cardiology) Winfield Rast, [place] as Referring  Physician (Chiropractic Medicine) Deboraha Sprang, MD as Consulting Physician (Cardiology) Pleasant, Eppie Gibson, RN as [place] Management    Assessment:     Exercise Activities and Dietary recommendations Current Exercise Habits: Home exercise routine, Type of exercise: walking, Time (Minutes): 15, Frequency (Times/Week): 5, Weekly Exercise (Minutes/Week): 75, Intensity: Mild, Exercise limited by: None identified  Goals    . Increase water intake          Recommend drinking at least 3-4 glasses of water a day      Fall Risk Fall Risk  03/14/2017 02/20/2017 12/29/2016 11/29/2016 08/31/2016  Falls in the past year? No Yes Yes Yes Yes  Number falls in past yr: - 1 1 1 1   Injury with Fall? - No No No No  Risk Factor Category  - - - - -  Risk for fall due to : - Impaired balance/gait;Impaired mobility Impaired balance/gait;Impaired mobility Impaired balance/gait;Impaired mobility Impaired balance/gait;Impaired mobility  Follow up - Falls evaluation completed;Education provided;Falls prevention discussed Falls evaluation completed;Education provided;Falls prevention discussed Falls evaluation completed;Education provided;Falls prevention discussed Education provided;Falls evaluation completed;Falls prevention discussed   Depression Screen PHQ 2/9 Scores 03/14/2017 02/20/2017 12/29/2016 11/29/2016  PHQ - 2 Score 0 0 0 0  PHQ- 9 Score - - - -     Cognitive Function     6CIT Screen 03/14/2017  What Year? 0 points  What month? 0 points  What time? 0 points  Count back from 20 0 points  Months in reverse 0 points  Repeat phrase 4 points  Total Score 4    Immunization History  Administered Date(s) Administered  . Influenza, High Dose Seasonal PF 05/31/2016  . Influenza,inj,Quad PF,36+ Mos 05/24/2015  . Pneumococcal Conjugate-13 04/08/2014  . Pneumococcal Polysaccharide-23 12/29/2003  . Td 09/12/2016   Screening Tests Health Maintenance  Topic Date Due  . INFLUENZA  VACCINE  03/28/2017  . TETANUS/TDAP  09/12/2026  . DEXA SCAN  Completed  . PNA vac Low Risk Adult  Completed      Plan:     I have personally reviewed and addressed the Medicare Annual Wellness questionnaire and have noted the following in the patient's chart:  A. Medical and social history B. Use of alcohol, tobacco or illicit drugs  C. Current medications and supplements D. Functional ability and status E.  Nutritional status F.  Physical activity G. Advance directives H. List of other physicians I.  Hospitalizations, surgeries, and ER visits in previous 12 months J.  [place] such as hearing and vision if needed, cognitive and depression L. Referrals and appointments   In addition, I have  reviewed and discussed with patient certain preventive protocols, quality metrics, and best practice recommendations. A written personalized care plan for preventive services as well as general preventive health recommendations were provided to patient.   Signed,  Tyler Aas, LPN Nurse Health Advisor   MD Recommendations: none

## 2017-03-14 NOTE — Assessment & Plan Note (Signed)
Discussed Donepizil.  Pt does not want any additional medications

## 2017-03-14 NOTE — Progress Notes (Signed)
BP 122/62   Pulse 88   Temp 98.2 F (36.8 C)   Ht 5' (1.524 m)   Wt 185 lb 14.4 oz (84.3 kg)   LMP  (LMP Unknown)   SpO2 96%   BMI 36.31 kg/m    Subjective:    Patient ID: Barbara Blackburn, female    DOB: Jan 08, 1929, 83 y.o.   MRN: [id number]  HPI: Barbara Blackburn is a 83 y.o. female  No chief complaint on file.  Started Sertraline last visit and her daughter states she is doing better and not as angry  Weakness States she has been weak for a while now.  She also had a replacement of her defibrillator battery.  States she is unable to clean house and cook.    SOB with exertion This is not unusual for her  Short term memory Did OK according to daughter with memory test.    Relevant past medical, surgical, family and social history reviewed and updated as indicated. Interim medical history since our last visit reviewed. Allergies and medications reviewed and updated.  Review of Systems  Per HPI unless specifically indicated above     Objective:    BP 122/62   Pulse 88   Temp 98.2 F (36.8 C)   Ht 5' (1.524 m)   Wt 185 lb 14.4 oz (84.3 kg)   LMP  (LMP Unknown)   SpO2 96%   BMI 36.31 kg/m   Wt Readings from Last 3 Encounters:  03/14/17 185 lb 14.4 oz (84.3 kg)  03/14/17 185 lb 14.4 oz (84.3 kg)  12/06/16 171 lb (77.6 kg)    Physical Exam  Constitutional: She is oriented to person, place, and time. She appears well-developed and well-nourished. No distress.  HENT:  Head: Normocephalic and atraumatic.  Eyes: Conjunctivae and lids are normal. Right eye exhibits no discharge. Left eye exhibits no discharge. No scleral icterus.  Neck: Normal range of motion. Neck supple. No JVD present. Carotid bruit is not present.  Cardiovascular: Normal heart sounds.  An irregularly irregular rhythm present.  Pulmonary/Chest: Effort normal and breath sounds normal.  Abdominal: Normal appearance. There is no splenomegaly or hepatomegaly.  Musculoskeletal: Normal range of  motion.  Neurological: She is alert and oriented to person, place, and time.  Skin: Skin is warm, dry and intact. No rash noted. No pallor.  Psychiatric: She has a normal mood and affect. Her behavior is normal. Judgment and thought content normal.    Results for orders placed or performed in visit on 03/14/17  HM DIABETES EYE EXAM  Result Value Ref Range   HM Diabetic Eye Exam No Retinopathy No Retinopathy      Assessment & Plan:   Problem List Items Addressed This Visit      Unprioritized   Advanced care planning/counseling discussion    A voluntary discussion about advance care planning including the explanation and discussion of advance directives was extensively discussed with the patient.  Explanation about the health care proxy and   During this discussion, the patient was able to identify a health care proxy as her daughter and plans not plan to fill out the paperwork required.  Patient was offered a separate [place] visit for further assistance with forms.         Dementia    Discussed Donepizil.  Pt does not want any additional medications      Hypercholesterolemia    Stable, continue present medications.  Consider trial off Crestor  Weakness    Will check labs      Relevant Orders   CBC with Differential/Platelet   Comprehensive metabolic panel   VITAMIN D 25 Hydroxy (Vit-D Deficiency, Fractures)   Vitamin B12       Follow up plan: Return in about 3 months (around 06/14/2017).

## 2017-03-15 LAB — COMPREHENSIVE METABOLIC PANEL WITH GFR
ALT: 13 IU/L (ref 0–32)
AST: 19 IU/L (ref 0–40)
Albumin/Globulin Ratio: 3.1 — ABNORMAL HIGH (ref 1.2–2.2)
Albumin: 4.9 g/dL — ABNORMAL HIGH (ref 3.5–4.7)
Alkaline Phosphatase: 86 IU/L (ref 39–117)
BUN/Creatinine Ratio: 25 (ref 12–28)
BUN: 20 mg/dL (ref 8–27)
Bilirubin Total: 0.8 mg/dL (ref 0.0–1.2)
CO2: 21 mmol/L (ref 20–29)
Calcium: 9.4 mg/dL (ref 8.7–10.3)
Chloride: 102 mmol/L (ref 96–106)
Creatinine, Ser: 0.81 mg/dL (ref 0.57–1.00)
GFR calc Af Amer: 77 mL/min/1.73
GFR calc non Af Amer: 66 mL/min/1.73
Globulin, Total: 1.6 g/dL (ref 1.5–4.5)
Glucose: 93 mg/dL (ref 65–99)
Potassium: 4.6 mmol/L (ref 3.5–5.2)
Sodium: 145 mmol/L — ABNORMAL HIGH (ref 134–144)
Total Protein: 6.5 g/dL (ref 6.0–8.5)

## 2017-03-15 LAB — CBC WITH DIFFERENTIAL/PLATELET
Basophils Absolute: 0 x10E3/uL (ref 0.0–0.2)
Basos: 0 %
EOS (ABSOLUTE): 0.1 x10E3/uL (ref 0.0–0.4)
Eos: 1 %
Hematocrit: 46.1 % (ref 34.0–46.6)
Hemoglobin: 15.2 g/dL (ref 11.1–15.9)
Immature Grans (Abs): 0 x10E3/uL (ref 0.0–0.1)
Immature Granulocytes: 0 %
Lymphocytes Absolute: 1.6 x10E3/uL (ref 0.7–3.1)
Lymphs: 17 %
MCH: 29.5 pg (ref 26.6–33.0)
MCHC: 33 g/dL (ref 31.5–35.7)
MCV: 90 fL (ref 79–97)
Monocytes Absolute: 0.6 x10E3/uL (ref 0.1–0.9)
Monocytes: 7 %
Neutrophils Absolute: 6.7 x10E3/uL (ref 1.4–7.0)
Neutrophils: 75 %
Platelets: 181 x10E3/uL (ref 150–379)
RBC: 5.15 x10E6/uL (ref 3.77–5.28)
RDW: 14.9 % (ref 12.3–15.4)
WBC: 8.9 x10E3/uL (ref 3.4–10.8)

## 2017-03-15 LAB — VITAMIN D 25 HYDROXY (VIT D DEFICIENCY, FRACTURES): Vit D, 25-Hydroxy: 14.4 ng/mL — ABNORMAL LOW (ref 30.0–100.0)

## 2017-03-15 LAB — VITAMIN B12: Vitamin B-12: 318 pg/mL (ref 232–1245)

## 2017-03-16 ENCOUNTER — Encounter: Payer: Self-pay | Admitting: Unknown Physician Specialty

## 2017-03-16 ENCOUNTER — Other Ambulatory Visit: Payer: Self-pay | Admitting: Unknown Physician Specialty

## 2017-03-16 MED ORDER — VITAMIN D (ERGOCALCIFEROL) 1.25 MG (50000 UNIT) PO CAPS: 50000.0000 [IU] | ORAL_CAPSULE | ORAL | 0 refills | Status: DC

## 2017-03-18 NOTE — Progress Notes (Signed)
Cardiology Office Note  Date:  03/18/2017   ID:  MOLLEIGH HUOT, DOB Oct 18, 1928, MRN [id number]  PCP:  Kathrine Haddock, NP   No chief complaint on file.   HPI:  Barbara Blackburn is a 83 y.o. female  with a history of  CAD, prior stenting Permanent atrial fibrillation, rate seems to be controlled on metoprolol.  On anticoagulation Chronic diastolic CHF Prior history of cardiomyopathy, ICD Myoview per [facility]) notes 2/14>>EF 50% w inferior scar Ejection fraction greater than 60% in April 2017, moderately dilated left atrium Who presents for follow-up of her coronary disease and atrial fibrillation   tolerating Xarelto.     loss of her sister the last 2 weeks. Also started on Zoloft recently.   PMH:   has a past medical history of 6949-lead (11/11/2013); Allergy; CHF (congestive heart failure) ([place]); Chronic atrial fibrillation ([place]); Coronary artery disease; Degenerative joint disease; Dyslipidemia; Hyperlipidemia; ICD-Medtronic (05/10/2009); and Nonischemic cardiomyopathy ([place]).  PSH:    Past Surgical History:  Procedure Laterality Date  . ICD GENERATOR CHANGEOUT N/A 12/06/2016   Procedure: ICD Generator Changeout;  Surgeon: Deboraha Sprang, MD;  Location: [facility];  Service: Cardiovascular;  Laterality: N/A;  . Medtronic dual-chamber ICD  01/05/2006   generator change April 2012  . PCI stent    . REPLACEMENT TOTAL KNEE      Current Outpatient Prescriptions  Medication Sig Dispense Refill  . amLODipine (NORVASC) 5 MG tablet TAKE 1 TABLET (5 MG TOTAL) BY MOUTH DAILY. 90 tablet 3  . diphenhydrAMINE (BENADRYL) 25 MG tablet Take 25 mg by mouth at bedtime as needed for allergies or sleep.    . furosemide (LASIX) 40 MG tablet Take 1 tablet (40 mg total) by mouth daily with lunch. 90 tablet 3  . losartan (COZAAR) 50 MG tablet Take 1 tablet (50 mg total) by mouth daily. 90 tablet 3  . meloxicam (MOBIC) 15 MG tablet Take 15 mg by mouth daily.    . potassium bicarbonate  (KLOR-CON/EF) 25 MEQ disintegrating tablet Take 1 tablet (25 mEq total) by mouth daily. 90 tablet 0  . Probiotic Product (ALIGN) 4 MG CAPS Take 4 mg by mouth daily.     . rivaroxaban (XARELTO) 20 MG TABS tablet Take 1 tablet (20 mg total) by mouth daily with supper. 90 tablet 3  . rosuvastatin (CRESTOR) 20 MG tablet Take 0.5 tablets (10 mg total) by mouth daily. 30 tablet 2  . sertraline (ZOLOFT) 50 MG tablet Take 1 tablet (50 mg total) by mouth daily. (Patient taking differently: Take 50 mg by mouth daily. ) 30 tablet 1  . Vitamin D, Ergocalciferol, (DRISDOL) 50000 units CAPS capsule Take 1 capsule (50,000 Units total) by mouth every 7 (seven) days. 12 capsule 0   No current facility-administered medications for this visit.      Allergies:   Clonazepam; Fluoxetine hcl; Lisinopril; and Zolpidem tartrate   Social History:  The patient  reports that she has never smoked. She has never used smokeless tobacco. She reports that she does not drink alcohol or use drugs.   Family History:   family history includes Aneurysm in her maternal grandmother and sister; Cancer in her mother and sister; Heart attack in her father; Hypertension in her sister.    Review of Systems: ROS   PHYSICAL EXAM: VS:  LMP  (LMP Unknown)  , BMI There is no height or weight on file to calculate BMI. GEN: Well nourished, well developed, in no acute distress  HEENT: normal Neck: no JVD, carotid bruits, or masses Cardiac: RRR; no murmurs, rubs, or gallops,no edema  Respiratory:  clear to auscultation bilaterally, normal work of breathing GI: soft, nontender, nondistended, + BS MS: no deformity or atrophy Skin: warm and dry, no rash Neuro:  Strength and sensation are intact Psych: euthymic mood, full affect    Recent Labs: 03/14/2017: ALT 13; BUN 20; Creatinine, Ser 0.81; Hemoglobin 15.2; Platelets 181; Potassium 4.6; Sodium 145    Lipid Panel Lab Results  Component Value Date   CHOL 149 09/12/2016   HDL 57  09/12/2016   LDLCALC 37 09/12/2016   TRIG 276 (H) 09/12/2016      Wt Readings from Last 3 Encounters:  03/14/17 185 lb 14.4 oz (84.3 kg)  03/14/17 185 lb 14.4 oz (84.3 kg)  12/06/16 171 lb (77.6 kg)       ASSESSMENT AND PLAN:  No diagnosis found.   Disposition:   F/U  6 months  No orders of the defined types were placed in this encounter.    Signed, Esmond Plants, M.D., Ph.D. 03/18/2017  [place]  This encounter was created in error - please disregard.

## 2017-03-19 ENCOUNTER — Other Ambulatory Visit: Payer: Self-pay

## 2017-03-19 MED ORDER — MELOXICAM 15 MG PO TABS: 15.0000 mg | ORAL_TABLET | Freq: Every day | ORAL | 2 refills | Status: DC

## 2017-03-20 ENCOUNTER — Encounter: Payer: Self-pay | Admitting: Cardiovascular Disease

## 2017-03-20 ENCOUNTER — Encounter (INDEPENDENT_AMBULATORY_CARE_PROVIDER_SITE_OTHER): Payer: Medicare Other | Admitting: Cardiovascular Disease

## 2017-03-20 ENCOUNTER — Ambulatory Visit (INDEPENDENT_AMBULATORY_CARE_PROVIDER_SITE_OTHER): Payer: Medicare Other | Admitting: Internal Medicine

## 2017-03-20 ENCOUNTER — Encounter: Payer: Self-pay | Admitting: Internal Medicine

## 2017-03-20 ENCOUNTER — Encounter: Payer: Medicare Other | Admitting: Internal Medicine

## 2017-03-20 VITALS — BP 150/68 | HR 70 | Ht 61.0 in | Wt 186.5 lb

## 2017-03-20 VITALS — BP 150/68 | HR 70 | Ht 60.0 in | Wt 186.5 lb

## 2017-03-20 DIAGNOSIS — Z95810 Presence of automatic (implantable) cardiac defibrillator: Secondary | ICD-10-CM | POA: Diagnosis not present

## 2017-03-20 DIAGNOSIS — I5022 Chronic systolic (congestive) heart failure: Secondary | ICD-10-CM

## 2017-03-20 DIAGNOSIS — I481 Persistent atrial fibrillation: Secondary | ICD-10-CM | POA: Diagnosis not present

## 2017-03-20 DIAGNOSIS — I428 Other cardiomyopathies: Secondary | ICD-10-CM

## 2017-03-20 DIAGNOSIS — I4819 Other persistent atrial fibrillation: Secondary | ICD-10-CM

## 2017-03-20 LAB — CUP PACEART INCLINIC DEVICE CHECK
Battery Remaining Longevity: 128 mo
Brady Statistic AP VS Percent: 0 %
Brady Statistic AS VP Percent: 14.12 %
Brady Statistic AS VS Percent: 85.88 %
Date Time Interrogation Session: 20180724144517
HighPow Impedance: 47 Ohm
HighPow Impedance: 60 Ohm
Implantable Lead Implant Date: 20040220
Implantable Lead Location: 753859
Implantable Lead Location: 753860
Implantable Lead Model: 5092
Lead Channel Pacing Threshold Pulse Width: 0.4 ms
Lead Channel Setting Pacing Amplitude: 2.5 V
Lead Channel Setting Pacing Pulse Width: 0.4 ms
Lead Channel Setting Sensing Sensitivity: 0.3 mV
MDC IDC LEAD IMPLANT DT: 20070426
MDC IDC LEAD IMPLANT DT: 20070511
MDC IDC LEAD LOCATION: 753860
MDC IDC MSMT BATTERY VOLTAGE: 3.06 V
MDC IDC MSMT LEADCHNL RA IMPEDANCE VALUE: 399 Ohm
MDC IDC MSMT LEADCHNL RV IMPEDANCE VALUE: 437 Ohm
MDC IDC MSMT LEADCHNL RV IMPEDANCE VALUE: 513 Ohm
MDC IDC MSMT LEADCHNL RV PACING THRESHOLD AMPLITUDE: 0.75 V
MDC IDC MSMT LEADCHNL RV SENSING INTR AMPL: 2.125 mV
MDC IDC PG IMPLANT DT: 20180411
MDC IDC STAT BRADY AP VP PERCENT: 0 %
MDC IDC STAT BRADY RA PERCENT PACED: 0 %
MDC IDC STAT BRADY RV PERCENT PACED: 16.14 %

## 2017-03-20 NOTE — Patient Instructions (Signed)
Medication Instructions: - Your physician recommends that you continue on your current medications as directed. Please refer to the Current Medication list given to you today.  Labwork: - none ordered  Procedures/Testing: - none ordered  Follow-Up: - Your physician recommends that you schedule a follow-up appointment in: 3 months with Dr. Rockey Situ.  - Remote monitoring is used to monitor your Pacemaker of ICD from home. This monitoring reduces the number of office visits required to check your device to one time per year. It allows [place] to keep an eye on the functioning of your device to ensure it is working properly. You are scheduled for a device check from home on 06/19/17. You may send your transmission at any time that day. If you have a wireless device, the transmission will be sent automatically. After your physician reviews your transmission, you will receive a postcard with your next transmission date.  - Your physician wants you to follow-up in: 1 year with Dr. Caryl Comes. You will receive a reminder letter in the mail two months in advance. If you don't receive a letter, please call our office to schedule the follow-up appointment.   Any Additional Special Instructions Will Be Listed Below (If Applicable).     If you need a refill on your cardiac medications before your next appointment, please call your pharmacy.

## 2017-03-20 NOTE — Progress Notes (Signed)
Patient Care Team: Kathrine Haddock, NP as PCP - General (Nurse Practitioner) Wende Bushy, MD as Consulting Physician (Cardiology) Winfield Rast, [place] as Referring Physician (Chiropractic Medicine) Deboraha Sprang, MD as Consulting Physician (Cardiology) [place], Eppie Gibson, RN as [place] Management   HPI  Barbara Blackburn is a 83 y.o. female Seen in followup for a dual chamber ICD implanted for syncope in the setting of  Nonischemic and  ischemic cardiomyopathy with prior STENTING  and congestive heart failure. She had  received a defibrillator with a 6949-lead.  At  device generator replacement in 2012; she underwent "pirating" of her rate sense pacing lead and capping of the rate sense portion of her ICD lead  She reached ERI and underwent generator replacement 4/18.  Her ejection fraction had been noted to be 20%  Myoview per [facility]) notes 2/14>>EF 50% w inferior scar  DATE TEST    2012        EF 20.25 %   2/14    Myoview   EF 50 %   4/17 Echo EF 60-65%      At last visit she was complaining of fatigue and weakness decreased her beta blocker.  We discontinued without any significant improvement  No recent orthostasis. No chest pain or shortness of breath. There is no edema.  She has hx of Atrial fibrillation now permanent, with prior stroke>> now on Rivaroxaban without bleeding issues  Date Cr GFR Hgb  7/18 0.81 60s 15.2           Past Medical History:  Diagnosis Date  . 6949-lead 11/11/2013  . Allergy   . CHF (congestive heart failure) (HCC)    class 2  . Chronic atrial fibrillation ([place])   . Coronary artery disease   . Degenerative joint disease    severe  . Dyslipidemia   . Hyperlipidemia   . ICD-Medtronic 05/10/2009   Qualifier: Diagnosis of  By: Lovena Le, MD, [facility], Binnie Kand   . Nonischemic cardiomyopathy [facility])     Past Surgical History:  Procedure Laterality Date  . ICD GENERATOR CHANGEOUT N/A 12/06/2016   Procedure: ICD  Generator Changeout;  Surgeon: Deboraha Sprang, MD;  Location: [place] [facility];  Service: Cardiovascular;  Laterality: N/A;  . Medtronic dual-chamber ICD  01/05/2006   generator change April 2012  . PCI stent    . REPLACEMENT TOTAL KNEE      Current Outpatient Prescriptions  Medication Sig Dispense Refill  . amLODipine (NORVASC) 5 MG tablet TAKE 1 TABLET (5 MG TOTAL) BY MOUTH DAILY. 90 tablet 3  . furosemide (LASIX) 40 MG tablet Take 1 tablet (40 mg total) by mouth daily with lunch. 90 tablet 3  . losartan (COZAAR) 50 MG tablet Take 1 tablet (50 mg total) by mouth daily. 90 tablet 3  . meloxicam (MOBIC) 15 MG tablet Take 1 tablet (15 mg total) by mouth daily. 30 tablet 2  . potassium bicarbonate (KLOR-CON/EF) 25 MEQ disintegrating tablet Take 1 tablet (25 mEq total) by mouth daily. 90 tablet 0  . Probiotic Product (ALIGN) 4 MG CAPS Take 4 mg by mouth daily.     . rivaroxaban (XARELTO) 20 MG TABS tablet Take 1 tablet (20 mg total) by mouth daily with supper. 90 tablet 3  . sertraline (ZOLOFT) 50 MG tablet Take 1 tablet (50 mg total) by mouth daily. (Patient taking differently: Take 50 mg by mouth daily. ) 30 tablet 1  . Vitamin  D, Ergocalciferol, (DRISDOL) 50000 units CAPS capsule Take 1 capsule (50,000 Units total) by mouth every 7 (seven) days. 12 capsule 0   No current facility-administered medications for this visit.     Allergies  Allergen Reactions  . Clonazepam     Nightmare   . Fluoxetine Hcl Other (See Comments)    nightmares  . Lisinopril     Cough   . Zolpidem Tartrate Other (See Comments)    Nightmares     Review of Systems negative except from HPI and PMH  Physical Exam Ht 5' (1.524 m)   Wt 186 lb 8 oz (84.6 kg)   LMP  (LMP Unknown)   BMI 36.42 kg/m  Well developed and nourished in no acute distress HENT normal Neck supple   Clear Subcu[place]oeus atrophy but no tethering or tenderness  Regular rate and rhythm, no murmurs or gallops Abd-soft with active BS  without hepatomegaly No Clubbing cyanosis edema Skin-warm and dry A & Oriented  Grossly normal sensory and motor function   ECG demonstrates atrial fibrillation 70 Intervals-/10/42 T-wave inversions now normal PVC  Assessment and  Plan  Nonischemic cardiomyopathy  Atrial fibrillation permanent  Congestive heart failure-chronic systolic  Syncope  ICD for secondary prevention  The patient's device was interrogated and the information was fully reviewed.  The device was reprogrammed to decrease atrial senstivity tow avoid current drain on the atrial discriminators   6949-lead with capping of the rate sense portion  The patient's device was interrogated.  The information was reviewed. No changes were made in the programming.    On Anticoagulation;  No bleeding issues   Euvolemic continue current meds

## 2017-03-22 ENCOUNTER — Ambulatory Visit: Payer: Medicare Other | Admitting: *Deleted

## 2017-03-26 ENCOUNTER — Ambulatory Visit: Payer: Self-pay | Admitting: *Deleted

## 2017-03-30 ENCOUNTER — Ambulatory Visit: Payer: Self-pay | Admitting: *Deleted

## 2017-03-30 ENCOUNTER — Other Ambulatory Visit: Payer: Self-pay | Admitting: *Deleted

## 2017-03-30 NOTE — Patient Outreach (Signed)
Barbara Blackburn) Care Management  03/30/2017   Barbara Blackburn Apr 29, 1929 [id number]  RN Health Coach telephone call to patient.  [place] compliance verified.Per patient she is walking laps in the garage for exercise. Per patient  she is still having some  back discomfort. She has to lay down and rest sometimes during the day. Patient is taking medications as per order. Patient is not weighing daily . RN had to reiterate weighing. Patient could not remember the zones. RN had to go over the zones and patient is in the green zone. Patient reported no falls since last outreach call. Patient has agreed to follow up outreach calls.   Current Medications:  Current Outpatient Prescriptions  Medication Sig Dispense Refill  . amLODipine (NORVASC) 5 MG tablet TAKE 1 TABLET (5 MG TOTAL) BY MOUTH DAILY. 90 tablet 3  . furosemide (LASIX) 40 MG tablet Take 1 tablet (40 mg total) by mouth daily with lunch. 90 tablet 3  . losartan (COZAAR) 50 MG tablet Take 1 tablet (50 mg total) by mouth daily. 90 tablet 3  . meloxicam (MOBIC) 15 MG tablet Take 1 tablet (15 mg total) by mouth daily. 30 tablet 2  . potassium bicarbonate (KLOR-CON/EF) 25 MEQ disintegrating tablet Take 1 tablet (25 mEq total) by mouth daily. 90 tablet 0  . Probiotic Product (ALIGN) 4 MG CAPS Take 4 mg by mouth daily.     . rivaroxaban (XARELTO) 20 MG TABS tablet Take 1 tablet (20 mg total) by mouth daily with supper. 90 tablet 3  . sertraline (ZOLOFT) 50 MG tablet Take 1 tablet (50 mg total) by mouth daily. (Patient taking differently: Take 50 mg by mouth daily. ) 30 tablet 1  . Vitamin D, Ergocalciferol, (DRISDOL) 50000 units CAPS capsule Take 1 capsule (50,000 Units total) by mouth every 7 (seven) days. 12 capsule 0   No current facility-administered medications for this visit.     Functional Status:  In your present state of health, do you have any difficulty performing the following activities: 03/30/2017 03/14/2017  Hearing? Tempie Donning  Vision? N N  Difficulty concentrating or making decisions? Tempie Donning  Walking or climbing stairs? N N  Dressing or bathing? N N  Doing errands, shopping? N N  Preparing Food and eating ? Y N  Using the Toilet? N N  In the past six months, have you accidently leaked urine? N N  Do you have problems with loss of bowel control? N N  Managing your Medications? N N  Managing your Finances? Y N  Housekeeping or managing your Housekeeping? Y N  Some recent data might be hidden    Fall/Depression Screening: Fall Risk  03/30/2017 03/14/2017 02/20/2017  Falls in the past year? No No Yes  Comment - - -  Number falls in past yr: - - 1  Comment - - -  Injury with Fall? - - No  Comment - - -  Risk Factor Category  - - -  Risk for fall due to : Impaired balance/gait;Impaired mobility - Impaired balance/gait;Impaired mobility  Follow up - - Falls evaluation completed;Education provided;Falls prevention discussed   PHQ 2/9 Scores 03/30/2017 03/14/2017 02/20/2017 12/29/2016 11/29/2016 08/31/2016 07/18/2016  PHQ - 2 Score 0 0 0 0 0 1 1  PHQ- 9 Score - - - - - - -   THN CM Care Plan Problem One     Most Recent Value  Care Plan Problem One  Knowledge Deficit in Self Management of Congestive  Heart Failure  Role Documenting the Problem One  [place] for Problem One  Active  THN Long Term Goal   Patient will have any readmissions for Congestive Heart Failure within the next 90 days  THN Long Term Goal Start Date  03/30/17  Interventions for Problem One Long Term Goal  RN reminded patient to keep appointments with primary medical Dr and cardiologist. RN reminded patient the importance of taking medication as per physician order. RN will follow up patient monthly  THN CM Short Term Goal #1   patient will be able to verbalize Zones of Congestive Heart Failure  THN CM Short Term Goal #1 Start Date  03/30/17  Interventions for Short Term Goal #1  RN reiterated the zones of congestive heart failure.  Patient has some short term memory loss and this need to be reiterated each time. RN will follow up each outreach call,.  THN CM Short Term Goal #2   Patient will report weighing daily within the next 30 days  THN CM Short Term Goal #2 Start Date  03/30/17  Interventions for Short Term Goal #2  RN discussed patient weighing. RN will follow up with discussion and teach back    [facility] CM Care Plan Problem Two     Most Recent Value  Care Plan Problem Two  fall prevention       Assessment:  No falls since lat outreach call Patient has short term memory loss Patient will continue to benefit from [place] telephonic outreach for education and support for Congestive Heart [place] self management.  Plan: RN reiterated the zones and action plan RN reiterated that patient needs to weigh RN discussed exercising RN discussed fall prevention RN will follow up outreach within the month of September  Barbara Blackburn Management [phone]

## 2017-04-04 DIAGNOSIS — L821 Other seborrheic keratosis: Secondary | ICD-10-CM | POA: Diagnosis not present

## 2017-04-04 DIAGNOSIS — C44629 Squamous cell carcinoma of skin of left upper limb, including shoulder: Secondary | ICD-10-CM | POA: Diagnosis not present

## 2017-04-04 DIAGNOSIS — Z859 Personal history of malignant neoplasm, unspecified: Secondary | ICD-10-CM | POA: Diagnosis not present

## 2017-04-04 DIAGNOSIS — Z85828 Personal history of other malignant neoplasm of skin: Secondary | ICD-10-CM | POA: Diagnosis not present

## 2017-04-04 DIAGNOSIS — D485 Neoplasm of uncertain behavior of skin: Secondary | ICD-10-CM | POA: Diagnosis not present

## 2017-04-04 DIAGNOSIS — L570 Actinic keratosis: Secondary | ICD-10-CM | POA: Diagnosis not present

## 2017-04-18 DIAGNOSIS — C44629 Squamous cell carcinoma of skin of left upper limb, including shoulder: Secondary | ICD-10-CM | POA: Diagnosis not present

## 2017-04-18 DIAGNOSIS — D0462 Carcinoma in situ of skin of left upper limb, including shoulder: Secondary | ICD-10-CM | POA: Diagnosis not present

## 2017-04-23 ENCOUNTER — Encounter: Payer: Self-pay | Admitting: Unknown Physician Specialty

## 2017-05-01 ENCOUNTER — Ambulatory Visit: Payer: Self-pay | Admitting: *Deleted

## 2017-05-16 ENCOUNTER — Other Ambulatory Visit: Payer: Self-pay | Admitting: *Deleted

## 2017-05-16 MED ORDER — RIVAROXABAN 20 MG PO TABS: 20.0000 mg | ORAL_TABLET | Freq: Every day | ORAL | 3 refills | Status: DC

## 2017-05-17 ENCOUNTER — Other Ambulatory Visit: Payer: Self-pay

## 2017-05-17 MED ORDER — RIVAROXABAN 20 MG PO TABS: 20.0000 mg | ORAL_TABLET | Freq: Every day | ORAL | 3 refills | Status: DC

## 2017-05-18 ENCOUNTER — Other Ambulatory Visit: Payer: Self-pay

## 2017-05-18 NOTE — Patient Outreach (Signed)
[facility] [facility]) Care Management  05/18/2017  Barbara Blackburn 1928/10/19 [id number]   Medication Adherence call to Barbara Blackburn the reason for this call is because Mrs. Better is showing past due under [place] on her rosuvastatin 20 mg spoke to patients daughter she said  Doctor took her off this medication two month ago she is no longer taking this medication.   [place] Management Direct Dial [phone]  Fax [phone] [name]

## 2017-05-28 ENCOUNTER — Other Ambulatory Visit: Payer: Self-pay | Admitting: *Deleted

## 2017-05-28 NOTE — Patient Outreach (Signed)
[facility]) Care Management  05/28/2017   Barbara Blackburn May 24, 1929 [id number]  [place] Management [phone] Per patient she is feeling pretty good. Patient stated she does feel a little weak since she had the battery changed. Per patient she is doing squats and laps in the garage with her walker. Patient does not drive or cook any more. Per patient her daughter fixes her food and takes her to her appointments. Patient has gotten new glasses 3 months ago. Per patient she gets a little winded when she tries to do a lot so she just sits down and rest a little while. Per patient when she is sitting she elevates her legs in her recliner. Patient is able to wash and dry clothes and do simple chores. Patient stated she doesn't go out very much with the humidity only to church and her doctor appointments,. Patient stated that sometimes when she is talking her mind goes blank and she can't remember what she was going to say.  Patient has agreed to follow up outreach calls.   Current Outpatient Prescriptions  Medication Sig Dispense Refill  . amLODipine (NORVASC) 5 MG tablet TAKE 1 TABLET (5 MG TOTAL) BY MOUTH DAILY. 90 tablet 3  . furosemide (LASIX) 40 MG tablet Take 1 tablet (40 mg total) by mouth daily with lunch. 90 tablet 3  . losartan (COZAAR) 50 MG tablet Take 1 tablet (50 mg total) by mouth daily. 90 tablet 3  . meloxicam (MOBIC) 15 MG tablet Take 1 tablet (15 mg total) by mouth daily. 30 tablet 2  . potassium bicarbonate (KLOR-CON/EF) 25 MEQ disintegrating tablet Take 1 tablet (25 mEq total) by mouth daily. 90 tablet 0  . Probiotic Product (ALIGN) 4 MG CAPS Take 4 mg by mouth daily.     . rivaroxaban (XARELTO) 20 MG TABS tablet Take 1 tablet (20 mg total) by mouth daily with supper. 90 tablet 3  . sertraline (ZOLOFT) 50 MG tablet Take 1 tablet (50 mg total) by mouth daily. (Patient taking differently: Take 50 mg by mouth daily. ) 30  tablet 1  . Vitamin D, Ergocalciferol, (DRISDOL) 50000 units CAPS capsule Take 1 capsule (50,000 Units total) by mouth every 7 (seven) days. 12 capsule 0   No current facility-administered medications for this visit.     Functional Status:  In your present state of health, do you have any difficulty performing the following activities: 05/28/2017 03/30/2017  Hearing? Tempie Donning  Vision? N N  Difficulty concentrating or making decisions? Tempie Donning  Walking or climbing stairs? N N  Dressing or bathing? N N  Doing errands, shopping? N N  Preparing Food and eating ? Y Y  Using the Toilet? N N  In the past six months, have you accidently leaked urine? N N  Do you have problems with loss of bowel control? - N  Managing your Medications? N N  Managing your Finances? Tempie Donning  Housekeeping or managing your Housekeeping? Y Y  Some recent data might be hidden    Fall/Depression Screening: Fall Risk  05/28/2017 03/30/2017 03/14/2017  Falls in the past year? No No No  Comment - - -  Number falls in past yr: - - -  Comment - - -  Injury with Fall? Yes - -  Comment - - -  Risk Factor Category  High Fall Risk - -  Risk for fall due to : Impaired balance/gait;Impaired mobility Impaired balance/gait;Impaired mobility -  Follow up Falls evaluation completed;Education provided;Falls prevention discussed - -   PHQ 2/9 Scores 05/28/2017 03/30/2017 03/14/2017 02/20/2017 12/29/2016 11/29/2016 08/31/2016  PHQ - 2 Score 0 0 0 0 0 0 1  PHQ- 9 Score - - - - - - -   THN CM Care Plan Problem One     Most Recent Value  Care Plan Problem One  Knowledge Deficit in Self Management of Congestive Heart Failure  Role Documenting the Problem One  [place] for Problem One  Active  THN Long Term Goal   Patient will have any readmissions for Congestive Heart Failure within the next 90 days  THN Long Term Goal Start Date  05/28/17  Interventions for Problem One Long Term Goal  RN reminded patient to keep appointments with primary  medical Dr and cardiologist. RN reminded patient the importance of taking medication as per physician order. RN will follow up patient monthly  THN CM Short Term Goal #1   patient will be able to verbalize Zones of Congestive Heart Failure  THN CM Short Term Goal #1 Start Date  05/28/17  Interventions for Short Term Goal #1  RN reiterated the zones of congestive heart failure. Patient has some short term memory loss and this need to be reiterated each time. RN will follow up each outreach call,.  THN CM Short Term Goal #2   Patient will report weighing daily within the next 30 days  Interventions for Short Term Goal #2  RN discussed patient weighing. RN will follow up with discussion and teach back  THN CM Short Term Goal #4  Patient will be able to verbalize foods low in sodium within the next 30 days  THN CM Short Term Goal #4 Start Date  05/28/17  Interventions for Short Term Goal #4  RN sent pictures chart of foods low in sodium. RN sent EMMI edcuational material on low sodium diet. RN sent educational material on dash diet with samples. RN will follow up with discussion and teach back.      Assessment:  Patient has short term  memory loss Patient needs reiteration of CHF zones and action plan  Plan:  RN reiterates zones and action plan RN assess patient new symptoms RN discussed medication adherence Patient has appointment with PCP later this month RN will follow up outreach within the month of November  Jennelle Pinkstaff [place] Management [phone]

## 2017-06-12 ENCOUNTER — Ambulatory Visit: Payer: Medicare Other | Admitting: Unknown Physician Specialty

## 2017-06-18 NOTE — Progress Notes (Signed)
Cardiology Office Note  Date:  06/21/2017   ID:  Barbara Blackburn, DOB 01/12/1929, MRN [id number]  PCP:  Kathrine Haddock, NP   Chief Complaint  Patient presents with  . other    6 month follow up. Meds reviewed by the pt. verbally. Pt. c/o shortness of breath with little exertion and has a "bee sting" feeling at ICD site.      HPI:  Barbara Blackburn is a 83 y.o. female  with a history of  CAD, prior stenting Permanent atrial fibrillation, rate seems to be controlled on metoprolol.  On anticoagulation Chronic diastolic CHF Prior history of cardiomyopathy, ICD Myoview per [facility]) notes 2/14>>EF 50% w inferior scar Ejection fraction greater than 60% in April 2017, moderately dilated left atrium Who presents for follow-up of her coronary disease and atrial fibrillation   tolerating Xarelto Legs are weak, difficulty getting around Does not do any regular exercise, sometimes some home stretching Lives near family  New device, ICD Has developed a very rare sensation left upper chest around the device, described as a Sting, "spark", "bee sting" Happens once a month maybe more laying in bed when she is stretched out in the chest  Prior history of adjustment disorder after loss of her sister  [facility] personally reviewed by myself on todays visit Atrial fibrillation with ventricular rate 78 bpm poor R wave progression to the anterior precordial leads concerning for old anterior MI, ST and T wave abnormality in V5, V6, 1 and aVL  PMH:   has a past medical history of 6949-lead (11/11/2013); Allergy; CHF (congestive heart failure) ([place]); Chronic atrial fibrillation ([place]); Coronary artery disease; Degenerative joint disease; Dyslipidemia; Hyperlipidemia; ICD-Medtronic (05/10/2009); and Nonischemic cardiomyopathy ([place]).  PSH:    Past Surgical History:  Procedure Laterality Date  . ICD GENERATOR CHANGEOUT N/A 12/06/2016   Procedure: ICD Generator Changeout;  Surgeon: Deboraha Sprang, MD;   Location: [place] [facility];  Service: Cardiovascular;  Laterality: N/A;  . Medtronic dual-chamber ICD  01/05/2006   generator change April 2012  . PCI stent    . REPLACEMENT TOTAL KNEE      Current Outpatient Prescriptions  Medication Sig Dispense Refill  . amLODipine (NORVASC) 5 MG tablet TAKE 1 TABLET (5 MG TOTAL) BY MOUTH DAILY. 90 tablet 3  . Cholecalciferol (VITAMIN D) 2000 units tablet Take 2,000 Units by mouth daily.    . furosemide (LASIX) 40 MG tablet Take 1 tablet (40 mg total) by mouth daily with lunch. 90 tablet 3  . losartan (COZAAR) 50 MG tablet Take 1 tablet (50 mg total) by mouth daily. 90 tablet 3  . meloxicam (MOBIC) 15 MG tablet TAKE 1 TABLET (15 MG TOTAL) BY MOUTH DAILY. 60 tablet 0  . potassium bicarbonate (KLOR-CON/EF) 25 MEQ disintegrating tablet Take 1 tablet (25 mEq total) by mouth daily. 90 tablet 0  . Probiotic Product (ALIGN) 4 MG CAPS Take 4 mg by mouth daily.     . rivaroxaban (XARELTO) 20 MG TABS tablet Take 1 tablet (20 mg total) by mouth daily with supper. 90 tablet 3  . sertraline (ZOLOFT) 50 MG tablet TAKE 1 TABLET BY MOUTH  DAILY (Patient taking differently: TAKE 1/2  TABLET BY MOUTH  DAILY) 30 tablet 0   No current facility-administered medications for this visit.      Allergies:   Clonazepam; Fluoxetine hcl; Lisinopril; and Zolpidem tartrate   Social History:  The patient  reports that she has never smoked. She has never used smokeless  tobacco. She reports that she does not drink alcohol or use drugs.   Family History:   family history includes Aneurysm in her maternal grandmother and sister; Cancer in her mother and sister; Heart attack in her father; Hypertension in her sister.    Review of Systems: Review of Systems  Constitutional: Negative.   Respiratory: Negative.   Cardiovascular: Negative.   Gastrointestinal: Negative.   Musculoskeletal: Negative.   Neurological: Negative.   Psychiatric/Behavioral: Negative.   All other systems  reviewed and are negative.    PHYSICAL EXAM: VS:  BP (!) 150/80 (BP Location: Left Arm, Patient Position: Sitting, Cuff Size: Normal)   Pulse 78   Ht 5' (1.524 m)   Wt 185 lb 4 oz (84 kg)   LMP  (LMP Unknown)   BMI 36.18 kg/m  , BMI Body mass index is 36.18 kg/m. GEN: Well nourished, well developed, in no acute distress  HEENT: normal  Neck: no JVD, carotid bruits, or masses Cardiac: RRR; no murmurs, rubs, or gallops,no edema  Respiratory:  clear to auscultation bilaterally, normal work of breathing GI: soft, nontender, nondistended, + BS MS: no deformity or atrophy  Skin: warm and dry, no rash Neuro:  Strength and sensation are intact Psych: euthymic mood, full affect    Recent Labs: 03/14/2017: ALT 13; BUN 20; Creatinine, Ser 0.81; Hemoglobin 15.2; Platelets 181; Potassium 4.6; Sodium 145    Lipid Panel Lab Results  Component Value Date   CHOL 149 09/12/2016   HDL 57 09/12/2016   LDLCALC 37 09/12/2016   TRIG 276 (H) 09/12/2016      Wt Readings from Last 3 Encounters:  06/21/17 185 lb 4 oz (84 kg)  03/20/17 186 lb 8 oz (84.6 kg)  03/20/17 186 lb 8 oz (84.6 kg)       ASSESSMENT AND PLAN:  Persistent atrial fibrillation (HCC) - Plan: EKG 12-Lead Rate well controlled, tolerating anticoagulation  Hypercholesterolemia Cholesterol is at goal on the current lipid regimen. No changes to the medications were made.  Chronic systolic heart failure (HCC) Euvolemic on today's visit, Normal ejection fraction on previous echocardiogram April 2017 with normal right heart pressures  Essential hypertension, benign Blood pressure elevated, she reports this is normal for doctor visits but well controlled at home . No changes made to the medications.  Implantable cardioverter-defibrillator (ICD) in situ Followed by Dr. Caryl Comes, recent device change out Having rare quick sharp pain in the region of the device proximally once per month Recommend we monitor this for now.   Hopefully this will resolve with time Seems to happen when she lays down in bed, stretches out in the chest  Disposition:   F/U  12 months   Orders Placed This Encounter  Procedures  . EKG 12-Lead     Signed, Esmond Plants, M.D., Ph.D. 06/21/2017  [place], [place]

## 2017-06-19 ENCOUNTER — Other Ambulatory Visit: Payer: Self-pay | Admitting: Unknown Physician Specialty

## 2017-06-19 ENCOUNTER — Ambulatory Visit (INDEPENDENT_AMBULATORY_CARE_PROVIDER_SITE_OTHER): Payer: Medicare Other | Admitting: *Deleted

## 2017-06-19 DIAGNOSIS — I428 Other cardiomyopathies: Secondary | ICD-10-CM | POA: Diagnosis not present

## 2017-06-19 DIAGNOSIS — I5022 Chronic systolic (congestive) heart failure: Secondary | ICD-10-CM

## 2017-06-20 NOTE — Progress Notes (Signed)
Remote ICD transmission.   

## 2017-06-21 ENCOUNTER — Other Ambulatory Visit: Payer: Self-pay | Admitting: Unknown Physician Specialty

## 2017-06-21 ENCOUNTER — Encounter: Payer: Self-pay | Admitting: Cardiovascular Disease

## 2017-06-21 ENCOUNTER — Ambulatory Visit (INDEPENDENT_AMBULATORY_CARE_PROVIDER_SITE_OTHER): Payer: Medicare Other | Admitting: Cardiovascular Disease

## 2017-06-21 VITALS — BP 150/80 | HR 78 | Ht 60.0 in | Wt 185.2 lb

## 2017-06-21 DIAGNOSIS — I10 Essential (primary) hypertension: Secondary | ICD-10-CM

## 2017-06-21 DIAGNOSIS — Z95810 Presence of automatic (implantable) cardiac defibrillator: Secondary | ICD-10-CM

## 2017-06-21 DIAGNOSIS — I481 Persistent atrial fibrillation: Secondary | ICD-10-CM

## 2017-06-21 DIAGNOSIS — I4819 Other persistent atrial fibrillation: Secondary | ICD-10-CM

## 2017-06-21 DIAGNOSIS — I5022 Chronic systolic (congestive) heart failure: Secondary | ICD-10-CM

## 2017-06-21 DIAGNOSIS — E7800 Pure hypercholesterolemia, unspecified: Secondary | ICD-10-CM

## 2017-06-21 DIAGNOSIS — I420 Dilated cardiomyopathy: Secondary | ICD-10-CM | POA: Diagnosis not present

## 2017-06-21 LAB — CUP PACEART REMOTE DEVICE CHECK
Battery Remaining Longevity: 126 mo
Brady Statistic AS VS Percent: 90.37 %
Date Time Interrogation Session: 20181023073524
HighPow Impedance: 46 Ohm
HighPow Impedance: 58 Ohm
Implantable Lead Implant Date: 20040220
Implantable Lead Implant Date: 20070426
Implantable Lead Implant Date: 20070511
Implantable Lead Location: 753859
Implantable Lead Location: 753860
Implantable Lead Model: 5092
Implantable Pulse Generator Implant Date: 20180411
Lead Channel Impedance Value: 399 Ohm
Lead Channel Impedance Value: 513 Ohm
Lead Channel Sensing Intrinsic Amplitude: 0.375 mV
Lead Channel Sensing Intrinsic Amplitude: 1.625 mV
Lead Channel Setting Pacing Amplitude: 2.5 V
MDC IDC LEAD LOCATION: 753860
MDC IDC MSMT BATTERY VOLTAGE: 3.04 V
MDC IDC MSMT LEADCHNL RA SENSING INTR AMPL: 0.75 mV
MDC IDC MSMT LEADCHNL RV IMPEDANCE VALUE: 437 Ohm
MDC IDC MSMT LEADCHNL RV PACING THRESHOLD AMPLITUDE: 0.625 V
MDC IDC MSMT LEADCHNL RV PACING THRESHOLD PULSEWIDTH: 0.4 ms
MDC IDC MSMT LEADCHNL RV SENSING INTR AMPL: 1.625 mV
MDC IDC SET LEADCHNL RV PACING PULSEWIDTH: 0.4 ms
MDC IDC SET LEADCHNL RV SENSING SENSITIVITY: 0.3 mV
MDC IDC STAT BRADY AP VP PERCENT: 0 %
MDC IDC STAT BRADY AP VS PERCENT: 0 %
MDC IDC STAT BRADY AS VP PERCENT: 9.63 %
MDC IDC STAT BRADY RA PERCENT PACED: 0 %
MDC IDC STAT BRADY RV PERCENT PACED: 9.14 %

## 2017-06-21 NOTE — Patient Instructions (Signed)

## 2017-06-22 ENCOUNTER — Encounter: Payer: Self-pay | Admitting: Unknown Physician Specialty

## 2017-06-22 ENCOUNTER — Encounter: Payer: Self-pay | Admitting: Cardiology

## 2017-06-22 ENCOUNTER — Ambulatory Visit (INDEPENDENT_AMBULATORY_CARE_PROVIDER_SITE_OTHER): Payer: Medicare Other | Admitting: Unknown Physician Specialty

## 2017-06-22 VITALS — BP 147/78 | HR 52 | Wt 185.0 lb

## 2017-06-22 DIAGNOSIS — G301 Alzheimer's disease with late onset: Secondary | ICD-10-CM

## 2017-06-22 DIAGNOSIS — F320 Major depressive disorder, single episode, mild: Secondary | ICD-10-CM

## 2017-06-22 DIAGNOSIS — F0280 Dementia in other diseases classified elsewhere without behavioral disturbance: Secondary | ICD-10-CM

## 2017-06-22 DIAGNOSIS — M19041 Primary osteoarthritis, right hand: Secondary | ICD-10-CM

## 2017-06-22 DIAGNOSIS — E876 Hypokalemia: Secondary | ICD-10-CM

## 2017-06-22 DIAGNOSIS — E7800 Pure hypercholesterolemia, unspecified: Secondary | ICD-10-CM | POA: Diagnosis not present

## 2017-06-22 DIAGNOSIS — Z23 Encounter for immunization: Secondary | ICD-10-CM

## 2017-06-22 DIAGNOSIS — M19042 Primary osteoarthritis, left hand: Secondary | ICD-10-CM

## 2017-06-22 MED ORDER — MELOXICAM 15 MG PO TABS: 15.0000 mg | ORAL_TABLET | Freq: Every day | ORAL | 1 refills | Status: DC

## 2017-06-22 MED ORDER — ROSUVASTATIN CALCIUM 10 MG PO TABS: 10.0000 mg | ORAL_TABLET | Freq: Every day | ORAL | 1 refills | Status: DC

## 2017-06-22 MED ORDER — DONEPEZIL HCL 5 MG PO TBDP: 5.0000 mg | ORAL_TABLET | Freq: Every day | ORAL | 0 refills | Status: DC

## 2017-06-22 MED ORDER — DONEPEZIL HCL 5 MG PO TABS: 5.0000 mg | ORAL_TABLET | Freq: Every day | ORAL | 1 refills | Status: DC

## 2017-06-22 MED ORDER — POTASSIUM BICARBONATE 25 MEQ PO TBEF: 25.0000 meq | EFFERVESCENT_TABLET | Freq: Every day | ORAL | 0 refills | Status: DC

## 2017-06-22 MED ORDER — SERTRALINE HCL 50 MG PO TABS: 50.0000 mg | ORAL_TABLET | Freq: Every day | ORAL | 4 refills | Status: DC

## 2017-06-22 MED ORDER — POTASSIUM BICARBONATE 25 MEQ PO TBEF
25.0000 meq | EFFERVESCENT_TABLET | Freq: Every day | ORAL | 0 refills | Status: DC
Start: 2017-06-22 — End: 2017-06-22

## 2017-06-22 NOTE — Assessment & Plan Note (Signed)
Start Donepezil.  Start daily 5 mg daily for one month and then increase to 10 mg.

## 2017-06-22 NOTE — Assessment & Plan Note (Signed)
Increase Zoloft to 50 mg  

## 2017-06-22 NOTE — Patient Instructions (Signed)
Increase zoloft and Crestor to a whole pill Start Donepezil 5 mg.

## 2017-06-22 NOTE — Assessment & Plan Note (Signed)
Risks and benefits of Meloxicam discussed.  Continue Meloxicam

## 2017-06-22 NOTE — Progress Notes (Signed)
BP (!) 147/78   Pulse (!) 52   Wt 185 lb (83.9 kg)   LMP  (LMP Unknown)   SpO2 99%   BMI 36.13 kg/m    Subjective:    Patient ID: Barbara Blackburn, female    DOB: [birth year], 83 y.o.   MRN: [id number]  HPI: Barbara Blackburn is a 83 y.o. female  Chief Complaint  Patient presents with  . Dementia  . Hyperlipidemia   Pt is here with her daughter  Dementia Complaints she tries to say something and then everything then goes blank.  Repeats things.  Pt and daughter want to consider Donepezil.    Hypercholesterol Dr. Rockey Situ wants her back on Crestor.  I took her off due to weakness.    Vit d deficiency Took Vitamin D 5,000 IUs weekly and now taking 2K daily  Depression States she gets depressed at times.  Feels she is doing well but onlyt taking 1/2 pill Depression screen [facility] 2/9 05/28/2017 03/30/2017 03/14/2017 02/20/2017 12/29/2016  Decreased Interest 0 0 0 0 0  Down, Depressed, Hopeless 0 0 0 0 0  PHQ - 2 Score 0 0 0 0 0  Altered sleeping - - - - -  Tired, decreased energy - - - - -  Change in appetite - - - - -  Feeling bad or failure about yourself  - - - - -  Trouble concentrating - - - - -  Moving slowly or fidgety/restless - - - - -  Suicidal thoughts - - - - -  PHQ-9 Score - - - - -  Difficult doing work/chores - - - - -   Arthritis In hips and fingers.  Meloxicam helps.  Taking it daily.  Seems to need this to help pain.  Last GFR was good   Relevant past medical, surgical, family and social history reviewed and updated as indicated. Interim medical history since our last visit reviewed. Allergies and medications reviewed and updated.  Review of Systems  Per HPI unless specifically indicated above     Objective:    BP (!) 147/78   Pulse (!) 52   Wt 185 lb (83.9 kg)   LMP  (LMP Unknown)   SpO2 99%   BMI 36.13 kg/m   Wt Readings from Last 3 Encounters:  06/22/17 185 lb (83.9 kg)  06/21/17 185 lb 4 oz (84 kg)  03/20/17 186 lb 8 oz (84.6 kg)      Physical Exam  Constitutional: She is oriented to person, place, and time. She appears well-developed and well-nourished. No distress.  HENT:  Head: Normocephalic and atraumatic.  Eyes: Conjunctivae and lids are normal. Right eye exhibits no discharge. Left eye exhibits no discharge. No scleral icterus.  Neck: Normal range of motion. Neck supple. No JVD present. Carotid bruit is not present.  Cardiovascular: An irregularly irregular rhythm present.  Pulmonary/Chest: Effort normal and breath sounds normal.  Abdominal: Normal appearance. There is no splenomegaly or hepatomegaly.  Musculoskeletal: Normal range of motion.  Neurological: She is alert and oriented to person, place, and time.  Skin: Skin is warm, dry and intact. No rash noted. No pallor.  Psychiatric: She has a normal mood and affect. Her behavior is normal. Judgment and thought content normal.    Results for orders placed or performed in visit on 06/19/17  [facility] [place]  Result Value Ref Range   Date Time Interrogation Session [id number]    Pulse Generator Manufacturer MERM  Pulse Gen Model [id number] Evera XT DR    Pulse Gen Serial Number [id number]    Clinic Name [facility]    Implantable Pulse Generator Type Implantable Cardiac Defibulator    Implantable Pulse Generator Implant Date [id number]    Implantable Lead Manufacturer MERM    Implantable Lead Model 5076 CapSureFix Novus    Implantable Lead Serial Number [id number]    Implantable Lead Implant Date [id number]    Implantable Lead Location Detail 1 APPENDAGE    Implantable Lead Location [id number]    Implantable Lead Manufacturer [facility]    Implantable Lead Model [id number] Sprint Fidelis    Implantable Lead Serial Number [id number]    Implantable Lead Implant Date [id number]    Implantable Lead Location Detail 1 APEX    Implantable Lead Location [id number]    Implantable Lead Manufacturer MERM    Implantable Lead Model 5092 CapSure SP Novus     Implantable Lead Serial Number [id number]    Implantable Lead Implant Date [id number]    Implantable Lead Location [phone]    Lead Channel Setting Sensing Sensitivity 0.3 mV   Lead Channel Setting Pacing Pulse Width 0.4 ms   Lead Channel Setting Pacing Amplitude 2.5 V   Lead Channel Impedance Value 399 ohm   Lead Channel Sensing Intrinsic Amplitude 0.75 mV   Lead Channel Sensing Intrinsic Amplitude 0.375 mV   Lead Channel Impedance Value 513 ohm   Lead Channel Impedance Value 437 ohm   Lead Channel Sensing Intrinsic Amplitude 1.625 mV   Lead Channel Sensing Intrinsic Amplitude 1.625 mV   Lead Channel Pacing Threshold Amplitude 0.625 V   Lead Channel Pacing Threshold Pulse Width 0.4 ms   HighPow Impedance 46 ohm   HighPow Impedance 58 ohm   Battery Status OK    Battery Remaining Longevity 126 mo   Battery Voltage 3.04 V   Brady Statistic RA Percent Paced 0 %   Brady Statistic RV Percent Paced 9.14 %   Brady Statistic AP VP Percent 0 %   Brady Statistic AS VP Percent 9.63 %   Brady Statistic AP VS Percent 0 %   Brady Statistic AS VS Percent 90.37 %   Eval Rhythm Af/Vs/Vp       Assessment & Plan:   Problem List Items Addressed This Visit      Unprioritized   Arthritis, degenerative    Risks and benefits of Meloxicam discussed.  Continue Meloxicam      Relevant Medications   meloxicam (MOBIC) 15 MG tablet   Dementia    Start Donepezil.  Start daily 5 mg daily for one month and then increase to 10 mg.        Relevant Medications   sertraline (ZOLOFT) 50 MG tablet   donepezil (ARICEPT) 5 MG tablet   Depression, major, single episode, mild (HCC)    Increase Zoloft to 50 mg      Relevant Medications   sertraline (ZOLOFT) 50 MG tablet   Hypercholesterolemia    Restart Crestor as no difference in weakness      Relevant Medications   rosuvastatin (CRESTOR) 10 MG tablet    Other Visit Diagnoses    Needs flu shot    -  Primary   Relevant Orders   Flu vaccine HIGH DOSE  PF (Fluzone High dose) (Completed)   Hypokalemia       Check CMP today   Relevant Medications   potassium bicarbonate (KLOR-CON/EF) 25 MEQ disintegrating tablet       Follow up plan:  Return in about 3 months (around 09/22/2017).

## 2017-06-22 NOTE — Assessment & Plan Note (Signed)
Restart Crestor as no difference in weakness

## 2017-06-29 ENCOUNTER — Ambulatory Visit: Payer: Self-pay | Admitting: *Deleted

## 2017-07-03 ENCOUNTER — Other Ambulatory Visit: Payer: Self-pay | Admitting: *Deleted

## 2017-07-03 NOTE — Patient Outreach (Signed)
[facility]) Care Management  07/03/2017   BINDI KLOMP [birth year] [phone] RN Health Coach telephone call to patient.  Hipaa compliance verified. Per patient she is doing pretty good. Patient does 5 laps in the garage with her walker usually twice a day. Per patient she uses a cane in the house.  Per patient she is not having any swelling in extremities. Per patient she is only short of breath when she is very active like making her bed. Patient did receive the flu shot. Patient has not had any falls. Per patient she has very good support from her daughter that lives next door. Patient elevates feet when setting. Per patient she was started on some new medication to help with her memory. Per patient it had made her so sleepy that she didn't take any more. RN explained to patient that she needs to discuss this with her doctor.  Patient has agreed to follow up outreach calls.     Current Outpatient Medications  Medication Sig Dispense Refill  . amLODipine (NORVASC) 5 MG tablet TAKE 1 TABLET (5 MG TOTAL) BY MOUTH DAILY. 90 tablet 3  . Cholecalciferol (VITAMIN D) 2000 units tablet Take 2,000 Units by mouth daily.    [name] donepezil (ARICEPT) 5 MG tablet Take 1 tablet (5 mg total) by mouth at bedtime. 90 tablet 1  . furosemide (LASIX) 40 MG tablet Take 1 tablet (40 mg total) by mouth daily with lunch. 90 tablet 3  . losartan (COZAAR) 50 MG tablet Take 1 tablet (50 mg total) by mouth daily. 90 tablet 3  . meloxicam (MOBIC) 15 MG tablet Take 1 tablet (15 mg total) by mouth daily. 90 tablet 1  . potassium bicarbonate (KLOR-CON/EF) 25 MEQ disintegrating tablet Take 1 tablet (25 mEq total) by mouth daily. 90 tablet 0  . Probiotic Product (ALIGN) 4 MG CAPS Take 4 mg by mouth daily.     . rivaroxaban (XARELTO) 20 MG TABS tablet Take 1 tablet (20 mg total) by mouth daily with supper. 90 tablet 3  . rosuvastatin (CRESTOR) 10 MG tablet Take 1 tablet (10 mg total) by mouth daily. 90 tablet  1  . sertraline (ZOLOFT) 50 MG tablet Take 1 tablet (50 mg total) by mouth daily. 90 tablet 4   No current facility-administered medications for this visit.     Functional Status:  In your present state of health, do you have any difficulty performing the following activities: 07/03/2017 05/28/2017  Hearing? Tempie Donning  Vision? N N  Difficulty concentrating or making decisions? Tempie Donning  Walking or climbing stairs? N N  Dressing or bathing? N N  Doing errands, shopping? N N  Preparing Food and eating ? Y Y  Using the Toilet? N N  In the past six months, have you accidently leaked urine? N N  Do you have problems with loss of bowel control? N -  Managing your Medications? N N  Managing your Finances? Tempie Donning  Housekeeping or managing your Housekeeping? Y Y  Some recent data might be hidden    Fall/Depression Screening: Fall Risk  07/03/2017 05/28/2017 03/30/2017  Falls in the past year? No No No  Comment - - -  Number falls in past yr: 1 - -  Comment - - -  Injury with Fall? Yes Yes -  Comment - - -  Risk Factor Category  High Fall Risk High Fall Risk -  Risk for fall due to : Impaired mobility;Impaired balance/gait Impaired balance/gait;Impaired mobility  Impaired balance/gait;Impaired mobility  Follow up Falls prevention discussed Falls evaluation completed;Education provided;Falls prevention discussed -   PHQ 2/9 Scores 07/03/2017 05/28/2017 03/30/2017 03/14/2017 02/20/2017 12/29/2016 11/29/2016  PHQ - 2 Score 0 0 0 0 0 0 0  PHQ- 9 Score - - - - - - -   THN CM Care Plan Problem One     Most Recent Value  Care Plan Problem One  Knowledge Deficit in Self Management of Congestive Heart Failure  Role Documenting the Problem One  [place] for Problem One  Active  THN Long Term Goal   Patient will have any readmissions for Congestive Heart Failure within the next 90 days  THN Long Term Goal Start Date  07/03/17  Interventions for Problem One Long Term Goal  RN reiterated to patient to keep  appointments with PCP and cardiologist. RN reiterated to patient the importance of taking medication as per physician order. RN will follow up patient monthly  THN CM Short Term Goal #1   patient will be able to verbalize Zones of Congestive Heart Failure  THN CM Short Term Goal #1 Start Date  07/03/17  Interventions for Short Term Goal #1  RN reiterated the zones of congestive heart failure. Patient has short term memory loss and this need to be reiterated each time. RN will follow up each outreach call,.  THN CM Short Term Goal #2   Patient will report weighing daily within the next 30 days  THN CM Short Term Goal #2 Start Date  07/03/17  Interventions for Short Term Goal #2  RN continue to reiterate patient weighing. RN sent new 2019 calendar book to document weights. RN will follow up with discussion and teach back  THN CM Short Term Goal #4  Patient will be able to verbalize foods low in sodium within the next 30 days  THN CM Short Term Goal #4 Start Date  07/03/17  Interventions for Short Term Goal #4  RN reiterated eating healthy with patient. RN has sent pictures  of foods that are low in sodium. RN sent EMMI edcuational material on low sodium diet. RN sent educational material on dash diet with samples. RN reiterateswith follow up with discussion and teach back.       Assessment:  Patient is exercising daily Patient has received her flu shot Patient has went for follow up annual exam Patient will continue  benefit from [place] telephonic outreach for education and support for CHF self management.  Plan:  RN reiterated doing  daily weights RN reiterated the action plan with zones RN sent patient a 2019 Calendar book for documentation RN will follow up within the month of December for teach back and discussion  [place] Care Management [phone]

## 2017-08-02 ENCOUNTER — Other Ambulatory Visit: Payer: Self-pay | Admitting: *Deleted

## 2017-08-02 NOTE — Patient Outreach (Signed)
[name]) Care Management  08/02/2017   Barbara Blackburn [birth year] [id number]  Subjective: [place] telephone call to patient.  Hipaa compliance verified. Per patient she is doing pretty good. Patient stated that she has no swelling in her lower extremities. Per patient she keeps them elevated in her recliner when she is sitting down. Patient Has an exercise routine that involves ambulating laps in the garage and she does 6-8 squats every night before going to bed. Per patient her main problem is the weakness in her legs. Patient stated she went to the chiropractor last week to work on her back. Patient has not had any recent falls. Per patient she does not wear support hose because she has no swelling in legs. Patient stated her blood pressure is good and her daughter takes it every day. Patient has a very good support system. Patient stated her daughter fixes meals and keeps her refrigerator stocked. Patient stated she does not weigh everyday and it has been a while. Patient needs reiterating to weigh at least once a week. RN discussed with patient that at this time. Patient is doing much better so we will close out her case in January. Patient has agreed to case closure and January outreach.    Current Medications:  Current Outpatient Medications  Medication Sig Dispense Refill  . amLODipine (NORVASC) 5 MG tablet TAKE 1 TABLET (5 MG TOTAL) BY MOUTH DAILY. 90 tablet 3  . Cholecalciferol (VITAMIN D) 2000 units tablet Take 2,000 Units by mouth daily.    [name] donepezil (ARICEPT) 5 MG tablet Take 1 tablet (5 mg total) by mouth at bedtime. 90 tablet 1  . furosemide (LASIX) 40 MG tablet Take 1 tablet (40 mg total) by mouth daily with lunch. 90 tablet 3  . losartan (COZAAR) 50 MG tablet Take 1 tablet (50 mg total) by mouth daily. 90 tablet 3  . meloxicam (MOBIC) 15 MG tablet Take 1 tablet (15 mg total) by mouth daily. 90 tablet 1  . potassium bicarbonate (KLOR-CON/EF) 25 MEQ  disintegrating tablet Take 1 tablet (25 mEq total) by mouth daily. 90 tablet 0  . Probiotic Product (ALIGN) 4 MG CAPS Take 4 mg by mouth daily.     . rivaroxaban (XARELTO) 20 MG TABS tablet Take 1 tablet (20 mg total) by mouth daily with supper. 90 tablet 3  . rosuvastatin (CRESTOR) 10 MG tablet Take 1 tablet (10 mg total) by mouth daily. 90 tablet 1  . sertraline (ZOLOFT) 50 MG tablet Take 1 tablet (50 mg total) by mouth daily. 90 tablet 4   No current facility-administered medications for this visit.     Functional Status:  In your present state of health, do you have any difficulty performing the following activities: 08/02/2017 07/03/2017  Hearing? Tempie Donning  Vision? N N  Difficulty concentrating or making decisions? Tempie Donning  Walking or climbing stairs? N N  Dressing or bathing? N N  Doing errands, shopping? N N  Preparing Food and eating ? - Y  Using the Toilet? N N  In the past six months, have you accidently leaked urine? N N  Do you have problems with loss of bowel control? N N  Managing your Medications? N N  Managing your Finances? Tempie Donning  Housekeeping or managing your Housekeeping? Y Y  Some recent data might be hidden    Fall/Depression Screening: Fall Risk  08/02/2017 07/03/2017 05/28/2017  Falls in the past year? No No No  Comment - - -  Number falls in past yr: 1 1 -  Comment - - -  Injury with Fall? Yes Yes Yes  Comment - - -  Risk Factor Category  High Fall Risk High Fall Risk High Fall Risk  Risk for fall due to : Impaired balance/gait;History of fall(s);Impaired mobility Impaired mobility;Impaired balance/gait Impaired balance/gait;Impaired mobility  Follow up Falls evaluation completed;Falls prevention discussed Falls prevention discussed Falls evaluation completed;Education provided;Falls prevention discussed   PHQ 2/9 Scores 08/02/2017 07/03/2017 05/28/2017 03/30/2017 03/14/2017 02/20/2017 12/29/2016  PHQ - 2 Score 0 0 0 0 0 0 0  PHQ- 9 Score - - - - - - -   THN CM Care Plan  Problem One     Most Recent Value  Care Plan Problem One  Knowledge Deficit in Self Management of Congestive Heart Failure  Role Documenting the Problem One  [place] for Problem One  Active  THN Long Term Goal   Patient will have any readmissions for Congestive Heart Failure within the next 90 days  THN Long Term Goal Start Date  08/02/17  Interventions for Problem One Long Term Goal  RN reiterated to patient to keep appointments with PCP and cardiologist. RN reiterated to patient the importance of taking medication as per physician order. RN will follow up patient monthly  THN CM Short Term Goal #1   patient will be able to verbalize Zones of Congestive Heart Failure  THN CM Short Term Goal #1 Start Date  08/02/17  Interventions for Short Term Goal #1  RN reiterated the zones of congestive heart failure. Patient has short term memory loss and this need to be reiterated each time. RN will follow up each outreach call,.  THN CM Short Term Goal #2   Patient will report weighing daily within the next 30 days  THN CM Short Term Goal #2 Start Date  08/02/17  Interventions for Short Term Goal #2  RN reiterates to patient to weigh.  RN received 2019 calendar book to document weights. RN will follow up with discussion and teach back  THN CM Short Term Goal #4  Patient will be able to verbalize foods low in sodium within the next 30 days  THN CM Short Term Goal #4 Start Date  08/02/17  Interventions for Short Term Goal #4  RN reiterated eating healthy with patient. RN has sent pictures  of foods that are low in sodium. RN sent EMMI edcuational material on low sodium diet. RN sent educational material on dash diet with samples. RN reiterateswith follow up with discussion and teach back.      Assessment:  Patient has good support system Patient does not weigh daily Patient has a routine exercise Patient has not had any recent falls  Plan:  RN discussed the signs and symptoms of CHF  zone Patient has some short term memory loss Patient will continue her exercise routine Patient is adhering to medications RN discussed weighing RN will look at closing out in January  [place] [facility] Management [phone]

## 2017-08-31 DIAGNOSIS — M25561 Pain in right knee: Secondary | ICD-10-CM | POA: Diagnosis not present

## 2017-08-31 DIAGNOSIS — Z96652 Presence of left artificial knee joint: Secondary | ICD-10-CM | POA: Diagnosis not present

## 2017-08-31 DIAGNOSIS — M25562 Pain in left knee: Secondary | ICD-10-CM | POA: Diagnosis not present

## 2017-08-31 DIAGNOSIS — M1711 Unilateral primary osteoarthritis, right knee: Secondary | ICD-10-CM | POA: Diagnosis not present

## 2017-09-03 ENCOUNTER — Other Ambulatory Visit: Payer: Self-pay | Admitting: *Deleted

## 2017-09-04 NOTE — Patient Outreach (Signed)
[place] [facility]) Care Management  09/04/2017   Barbara Blackburn [birth year] [id number]  Subjective: RN Health Coach telephone call to patient.  Hipaa compliance verified. Per patient  has not weighed today. Per patient she had recently went to the doctor and they weighted her there. Patient stated she has been trying to do her exercises. She walks in the garage doing 3 - 4 laps. Per patient she does squats at night. Patient stated she is having some back pain and she goes to the chiropractic every other Tuesday. Per patient she is taking all medications as prescribed. Patient has had no recent falls. Patient has a new wheelchair for outing that her daughter gave her for Christmas. Patient agreed for daughter to talk with nurse. Per daughter they have installed some special handrails for the patient to go into the garage . Patient has a a medical alert in which she wears at all times and  and a panic alarm button at her bedside. Per daughter she has not been checking on the patient weighing daily, but she will start follow up on it. Patient has agreed to follow up outreach. RN will look at one more follow up call and closing. Patient has very good support system.   Current Medications:  Current Outpatient Medications  Medication Sig Dispense Refill  . amLODipine (NORVASC) 5 MG tablet TAKE 1 TABLET (5 MG TOTAL) BY MOUTH DAILY. 90 tablet 3  . Cholecalciferol (VITAMIN D) 2000 units tablet Take 2,000 Units by mouth daily.    [name] donepezil (ARICEPT) 5 MG tablet Take 1 tablet (5 mg total) by mouth at bedtime. 90 tablet 1  . furosemide (LASIX) 40 MG tablet Take 1 tablet (40 mg total) by mouth daily with lunch. 90 tablet 3  . losartan (COZAAR) 50 MG tablet Take 1 tablet (50 mg total) by mouth daily. 90 tablet 3  . meloxicam (MOBIC) 15 MG tablet Take 1 tablet (15 mg total) by mouth daily. 90 tablet 1  . potassium bicarbonate (KLOR-CON/EF) 25 MEQ disintegrating tablet Take 1 tablet (25 mEq total)  by mouth daily. 90 tablet 0  . Probiotic Product (ALIGN) 4 MG CAPS Take 4 mg by mouth daily.     . rivaroxaban (XARELTO) 20 MG TABS tablet Take 1 tablet (20 mg total) by mouth daily with supper. 90 tablet 3  . rosuvastatin (CRESTOR) 10 MG tablet Take 1 tablet (10 mg total) by mouth daily. 90 tablet 1  . sertraline (ZOLOFT) 50 MG tablet Take 1 tablet (50 mg total) by mouth daily. 90 tablet 4   No current facility-administered medications for this visit.     Functional Status:  In your present state of health, do you have any difficulty performing the following activities: 09/03/2017 08/02/2017  Hearing? Tempie Donning  Vision? N N  Difficulty concentrating or making decisions? Tempie Donning  Walking or climbing stairs? N N  Dressing or bathing? N N  Doing errands, shopping? N N  Preparing Food and eating ? Y -  Using the Toilet? N N  In the past six months, have you accidently leaked urine? N N  Do you have problems with loss of bowel control? N N  Managing your Medications? N N  Managing your Finances? Tempie Donning  Housekeeping or managing your Housekeeping? Y Y  Some recent data might be hidden    Fall/Depression Screening: Fall Risk  09/03/2017 08/02/2017 07/03/2017  Falls in the past year? No No No  Comment - - -  Number falls in past yr: - 1 1  Comment - - -  Injury with Fall? - Yes Yes  Comment - - -  Risk Factor Category  High Fall Risk High Fall Risk High Fall Risk  Risk for fall due to : History of fall(s);Impaired balance/gait;Impaired mobility Impaired balance/gait;History of fall(s);Impaired mobility Impaired mobility;Impaired balance/gait  Follow up Falls evaluation completed;Education provided;Falls prevention discussed Falls evaluation completed;Falls prevention discussed Falls prevention discussed   PHQ 2/9 Scores 09/03/2017 08/02/2017 07/03/2017 05/28/2017 03/30/2017 03/14/2017 02/20/2017  PHQ - 2 Score 0 0 0 0 0 0 0  PHQ- 9 Score - - - - - - -   THN CM Care Plan Problem One     Most Recent Value   Care Plan Problem One  Knowledge Deficit in Self Management of Congestive Heart Failure  Role Documenting the Problem One  [place] for Problem One  Active  THN Long Term Goal   Patient will have any readmissions for Congestive Heart Failure within the next 90 days  THN Long Term Goal Start Date  09/04/17  Interventions for Problem One Long Term Goal  RN reiterated to patient to keep appointments with PCP and cardiologist. RN reiterated to patient the importance of taking medication as per physician order. RN will follow up patient monthly  THN CM Short Term Goal #1   patient will be able to verbalize Zones of Congestive Heart Failure  THN CM Short Term Goal #1 Start Date  09/04/17  Interventions for Short Term Goal #1  RN reiterated the zones of congestive heart failure. Patient has short term memory loss and this need to be reiterated each time. RN will follow up each outreach call,.  THN CM Short Term Goal #2   Patient will report weighing daily within the next 30 days  THN CM Short Term Goal #2 Start Date  09/04/17  Interventions for Short Term Goal #2  RN reiterates to patient to weigh.  RN received 2019 calendar book to document weights. RN followed up with a discussion with the daughter on patient weighing. RN will follow up with discussion and teach back  THN CM Short Term Goal #4 Met Date  08/02/17      Assessment:  Patient has not had any recent falls Patient does not weigh daily Patient has a routine exercise program Patient carries medical alert at all times  Plan:  RN discussed daily weighing RN discussed zone RN discussed fall prevention and safety with patient and daughter RN will follow up within the month of February  [facility] [place] [place] Management [phone]

## 2017-09-17 DIAGNOSIS — M79671 Pain in right foot: Secondary | ICD-10-CM | POA: Diagnosis not present

## 2017-09-17 DIAGNOSIS — B351 Tinea unguium: Secondary | ICD-10-CM | POA: Diagnosis not present

## 2017-09-17 DIAGNOSIS — M19072 Primary osteoarthritis, left ankle and foot: Secondary | ICD-10-CM | POA: Diagnosis not present

## 2017-09-17 DIAGNOSIS — M79672 Pain in left foot: Secondary | ICD-10-CM | POA: Diagnosis not present

## 2017-09-18 ENCOUNTER — Ambulatory Visit (INDEPENDENT_AMBULATORY_CARE_PROVIDER_SITE_OTHER): Payer: Medicare Other | Admitting: *Deleted

## 2017-09-18 DIAGNOSIS — I428 Other cardiomyopathies: Secondary | ICD-10-CM | POA: Diagnosis not present

## 2017-09-18 DIAGNOSIS — I5022 Chronic systolic (congestive) heart failure: Secondary | ICD-10-CM

## 2017-09-18 NOTE — Progress Notes (Signed)
Remote ICD transmission.   

## 2017-09-19 ENCOUNTER — Encounter: Payer: Self-pay | Admitting: Cardiology

## 2017-09-21 LAB — CUP PACEART REMOTE DEVICE CHECK
Battery Voltage: 3.03 V
Brady Statistic AS VP Percent: 0 %
Brady Statistic RA Percent Paced: 0 %
Brady Statistic RV Percent Paced: 16.9 %
HIGH POWER IMPEDANCE MEASURED VALUE: 44 Ohm
HIGH POWER IMPEDANCE MEASURED VALUE: 56 Ohm
Implantable Lead Implant Date: 20070426
Implantable Lead Implant Date: 20070511
Implantable Lead Location: 753859
Implantable Lead Location: 753860
Implantable Lead Location: 753860
Implantable Lead Model: 5076
Implantable Lead Model: 5092
Implantable Lead Model: 6949
Implantable Pulse Generator Implant Date: 20180411
Lead Channel Impedance Value: 399 Ohm
Lead Channel Impedance Value: 513 Ohm
Lead Channel Pacing Threshold Amplitude: 0.625 V
Lead Channel Sensing Intrinsic Amplitude: 0.375 mV
Lead Channel Sensing Intrinsic Amplitude: 0.75 mV
Lead Channel Setting Sensing Sensitivity: 0.3 mV
MDC IDC LEAD IMPLANT DT: 20040220
MDC IDC MSMT BATTERY REMAINING LONGEVITY: 124 mo
MDC IDC MSMT LEADCHNL RA IMPEDANCE VALUE: 399 Ohm
MDC IDC MSMT LEADCHNL RV PACING THRESHOLD PULSEWIDTH: 0.4 ms
MDC IDC MSMT LEADCHNL RV SENSING INTR AMPL: 2.125 mV
MDC IDC MSMT LEADCHNL RV SENSING INTR AMPL: 2.125 mV
MDC IDC SESS DTM: 20190122062504
MDC IDC SET LEADCHNL RV PACING AMPLITUDE: 2.5 V
MDC IDC SET LEADCHNL RV PACING PULSEWIDTH: 0.4 ms
MDC IDC STAT BRADY AP VP PERCENT: 0 %
MDC IDC STAT BRADY AP VS PERCENT: 0 %
MDC IDC STAT BRADY AS VS PERCENT: 0 %

## 2017-09-24 ENCOUNTER — Encounter: Payer: Self-pay | Admitting: Unknown Physician Specialty

## 2017-09-24 ENCOUNTER — Ambulatory Visit (INDEPENDENT_AMBULATORY_CARE_PROVIDER_SITE_OTHER): Payer: Medicare Other | Admitting: Unknown Physician Specialty

## 2017-09-24 DIAGNOSIS — F320 Major depressive disorder, single episode, mild: Secondary | ICD-10-CM | POA: Diagnosis not present

## 2017-09-24 DIAGNOSIS — E559 Vitamin D deficiency, unspecified: Secondary | ICD-10-CM | POA: Diagnosis not present

## 2017-09-24 DIAGNOSIS — F0280 Dementia in other diseases classified elsewhere without behavioral disturbance: Secondary | ICD-10-CM

## 2017-09-24 DIAGNOSIS — G301 Alzheimer's disease with late onset: Secondary | ICD-10-CM

## 2017-09-24 NOTE — Assessment & Plan Note (Signed)
Taking D3 2K daily. Will continue this dose

## 2017-09-24 NOTE — Assessment & Plan Note (Signed)
Stable at 25 mg.   Continue present medications.

## 2017-09-24 NOTE — Progress Notes (Signed)
   BP (!) 158/86   Pulse 85   Temp (!) 97.5 F (36.4 C) (Oral)   Wt 181 lb 9.6 oz (82.4 kg)   LMP  (LMP Unknown)   SpO2 97%   BMI 35.47 kg/m    Subjective:    Patient ID: Barbara Blackburn, female    DOB: September 30, 1928, 84 y.o.   MRN: [id number]  HPI: Barbara Blackburn is a 84 y.o. female  Chief Complaint  Patient presents with  . Dementia    pt's daughter states that the patient stopped taking the donepezil because she did not like the way it made her feel     Pt is here with daughter  Dementia Pt states she did not like the Donepezil and after taking it for 2 days and felt too unsteady getting up the next AM. Daughter is getting her reading and doing crossword puzzles.  Noted low Vit D was low and started a supplement.  B12 low normal  Depression Ordered Sertraline increase to 50 mg but since feeling bad as obve, decreased back to 25 mg.  She is more active and getting out more.    Potassium replacement Does not like Klor- Con and finds it is expensive.  She would like to try a nutritional shake instead  Relevant past medical, surgical, family and social history reviewed and updated as indicated.   Interim medical history since our last visit reviewed. Allergies and medications reviewed and updated.  Review of Systems  Per HPI unless specifically indicated above     Objective:    BP (!) 158/86   Pulse 85   Temp (!) 97.5 F (36.4 C) (Oral)   Wt 181 lb 9.6 oz (82.4 kg)   LMP  (LMP Unknown)   SpO2 97%   BMI 35.47 kg/m   Wt Readings from Last 3 Encounters:  09/24/17 181 lb 9.6 oz (82.4 kg)  06/22/17 185 lb (83.9 kg)  06/21/17 185 lb 4 oz (84 kg)    Physical Exam  Constitutional: She is oriented to person, place, and time. She appears well-developed and well-nourished. No distress.  HENT:  Head: Normocephalic and atraumatic.  Eyes: Conjunctivae and lids are normal. Right eye exhibits no discharge. Left eye exhibits no discharge. No scleral icterus.  Neck:  Normal range of motion. Neck supple. No JVD present. Carotid bruit is not present.  Cardiovascular: Normal heart sounds. An irregularly irregular rhythm present.  Pulmonary/Chest: Effort normal and breath sounds normal.  Abdominal: Normal appearance. There is no splenomegaly or hepatomegaly.  Musculoskeletal: Normal range of motion.  Neurological: She is alert and oriented to person, place, and time.  Skin: Skin is warm, dry and intact. No rash noted. No pallor.  Psychiatric: She has a normal mood and affect. Her behavior is normal. Judgment and thought content normal.       Assessment & Plan:   Problem List Items Addressed This Visit      Unprioritized   Dementia    Mild.  Stop Donepezil as side-effects.        Depression, major, single episode, mild (HCC)    Stable at 25 mg.   Continue present medications.        Vitamin D deficiency    Taking D3 2K daily. Will continue this dose          Follow up plan: Return in about 5 months (around 02/22/2018), or if symptoms worsen or fail to improve.

## 2017-09-24 NOTE — Assessment & Plan Note (Signed)
Mild.  Stop Donepezil as side-effects.

## 2017-10-04 ENCOUNTER — Ambulatory Visit: Payer: Self-pay | Admitting: *Deleted

## 2017-10-05 ENCOUNTER — Other Ambulatory Visit: Payer: Self-pay | Admitting: *Deleted

## 2017-10-05 NOTE — Patient Outreach (Signed)
[facility]) Care Management  10/05/2017   NAWAL BURLING Aug 28, 1929 [id number]  RN Health Coach telephone call to patient.  [employer] compliance verified. Patient was able to confirm date of birth. Patient did not remember address until RN read it to her and she said that's right. RN notes that patient is having a decrease in her memory. Patient stated I am having problems with my memory.  Patient then stated that the doctor gave me something to help my memory,  but I don't like the way it made me feel so I don't take it. Patient is not weighing daily. Patient stated that she weighed at her doctor visit. Patient did not remember the zones. As RN was reiterating them. Patient would speak up and say I don't have any swelling. I am a little tired when I make my bed.   RN made patient aware that her case would be closed at this time and that it can be reopened if she has any other needs to make up aware.   Current Medications:  Current Outpatient Medications  Medication Sig Dispense Refill  . amLODipine (NORVASC) 5 MG tablet TAKE 1 TABLET (5 MG TOTAL) BY MOUTH DAILY. 90 tablet 3  . Cholecalciferol (VITAMIN D) 2000 units tablet Take 2,000 Units by mouth daily.    [name] ENSURE PLUS (ENSURE PLUS) LIQD Take 237 mLs by mouth.    . furosemide (LASIX) 40 MG tablet Take 1 tablet (40 mg total) by mouth daily with lunch. 90 tablet 3  . losartan (COZAAR) 50 MG tablet Take 1 tablet (50 mg total) by mouth daily. 90 tablet 3  . meloxicam (MOBIC) 15 MG tablet Take 1 tablet (15 mg total) by mouth daily. 90 tablet 1  . Probiotic Product (ALIGN) 4 MG CAPS Take 4 mg by mouth daily.     . rivaroxaban (XARELTO) 20 MG TABS tablet Take 1 tablet (20 mg total) by mouth daily with supper. 90 tablet 3  . rosuvastatin (CRESTOR) 10 MG tablet Take 1 tablet (10 mg total) by mouth daily. 90 tablet 1  . sertraline (ZOLOFT) 50 MG tablet Take 1 tablet (50 mg total) by mouth daily. 90 tablet 4   No current  facility-administered medications for this visit.     Functional Status:  In your present state of health, do you have any difficulty performing the following activities: 10/05/2017 09/03/2017  Hearing? Tempie Donning  Vision? N N  Difficulty concentrating or making decisions? Tempie Donning  Walking or climbing stairs? N N  Dressing or bathing? N N  Doing errands, shopping? N N  Preparing Food and eating ? Y Y  Using the Toilet? N N  In the past six months, have you accidently leaked urine? N N  Do you have problems with loss of bowel control? N N  Managing your Medications? Y N  Comment daughter prepared medications -  Managing your Finances? Tempie Donning  Comment daughter handles  -  Housekeeping or managing your Housekeeping? Tempie Donning  Comment daughter assists -  Some recent data might be hidden    Fall/Depression Screening: Fall Risk  10/05/2017 09/24/2017 09/03/2017  Falls in the past year? Yes No No  Comment - - -  Number falls in past yr: 1 - -  Comment - - -  Injury with Fall? Yes - -  Comment - - -  Risk Factor Category  High Fall Risk - High Fall Risk  Risk for fall due to : History of  fall(s);Impaired balance/gait;Impaired mobility - History of fall(s);Impaired balance/gait;Impaired mobility  Follow up Falls evaluation completed;Education provided;Falls prevention discussed - Falls evaluation completed;Education provided;Falls prevention discussed   PHQ 2/9 Scores 10/05/2017 09/24/2017 09/03/2017 08/02/2017 07/03/2017 05/28/2017 03/30/2017  PHQ - 2 Score 2 2 0 0 0 0 0  PHQ- 9 Score 5 5 - - - - -   THN CM Care Plan Problem One     Most Recent Value  Care Plan Problem One  Knowledge Deficit in Self Management of Congestive Heart Failure  Role Documenting the Problem One  [place] for Problem One  Not Active  THN Long Term Goal Met Date  10/05/17  [facility] CM Short Term Goal #1 Met Date  10/05/17  [facility] CM Short Term Goal #2   Patient will report weighing daily within the next 30 days      Assessment:   Patient is not weighing daily or weekly Patient memory is worsening with the progression of disease Patient does have on her medical alert Daughter fixes pill boxes and makes sure patient takes medications Patient is unable to remember the zones and action plan with out constant reiteration  Plan:  RN made patient aware that case is being closed. if she has additional needs please let us know RN sent closure letter to patient  RN sent closure letter to physician RN sent closure to [place] [facility] [phone]

## 2017-10-22 DIAGNOSIS — Z872 Personal history of diseases of the skin and subcutaneous tissue: Secondary | ICD-10-CM | POA: Diagnosis not present

## 2017-10-22 DIAGNOSIS — D485 Neoplasm of uncertain behavior of skin: Secondary | ICD-10-CM | POA: Diagnosis not present

## 2017-10-22 DIAGNOSIS — C44319 Basal cell carcinoma of skin of other parts of face: Secondary | ICD-10-CM | POA: Diagnosis not present

## 2017-10-22 DIAGNOSIS — Z859 Personal history of malignant neoplasm, unspecified: Secondary | ICD-10-CM | POA: Diagnosis not present

## 2017-10-22 DIAGNOSIS — M79672 Pain in left foot: Secondary | ICD-10-CM | POA: Diagnosis not present

## 2017-10-22 DIAGNOSIS — Z85828 Personal history of other malignant neoplasm of skin: Secondary | ICD-10-CM | POA: Diagnosis not present

## 2017-10-22 DIAGNOSIS — L578 Other skin changes due to chronic exposure to nonionizing radiation: Secondary | ICD-10-CM | POA: Diagnosis not present

## 2017-10-22 DIAGNOSIS — M79671 Pain in right foot: Secondary | ICD-10-CM | POA: Diagnosis not present

## 2017-10-22 DIAGNOSIS — M19072 Primary osteoarthritis, left ankle and foot: Secondary | ICD-10-CM | POA: Diagnosis not present

## 2017-10-22 DIAGNOSIS — L570 Actinic keratosis: Secondary | ICD-10-CM | POA: Diagnosis not present

## 2017-10-24 ENCOUNTER — Other Ambulatory Visit: Payer: Self-pay | Admitting: Cardiovascular Disease

## 2017-10-24 ENCOUNTER — Other Ambulatory Visit: Payer: Self-pay | Admitting: Unknown Physician Specialty

## 2017-10-24 DIAGNOSIS — I5022 Chronic systolic (congestive) heart failure: Secondary | ICD-10-CM

## 2017-12-18 ENCOUNTER — Ambulatory Visit (INDEPENDENT_AMBULATORY_CARE_PROVIDER_SITE_OTHER): Payer: Medicare Other | Admitting: *Deleted

## 2017-12-18 DIAGNOSIS — I428 Other cardiomyopathies: Secondary | ICD-10-CM

## 2017-12-18 DIAGNOSIS — I5022 Chronic systolic (congestive) heart failure: Secondary | ICD-10-CM

## 2017-12-18 NOTE — Progress Notes (Signed)
Remote ICD transmission.   

## 2017-12-19 ENCOUNTER — Encounter: Payer: Self-pay | Admitting: Cardiology

## 2018-01-01 ENCOUNTER — Emergency Department: Payer: Medicare Other

## 2018-01-01 ENCOUNTER — Emergency Department
Admission: EM | Admit: 2018-01-01 | Discharge: 2018-01-01 | Disposition: A | Discharge status: Other Acute Inpatient Hospital | Payer: Medicare Other | Attending: Student in an Organized Health Care Education/Training Program | Admitting: Student in an Organized Health Care Education/Training Program

## 2018-01-01 ENCOUNTER — Other Ambulatory Visit: Payer: Self-pay

## 2018-01-01 DIAGNOSIS — R21 Rash and other nonspecific skin eruption: Secondary | ICD-10-CM | POA: Diagnosis not present

## 2018-01-01 DIAGNOSIS — I429 Cardiomyopathy, unspecified: Secondary | ICD-10-CM | POA: Diagnosis not present

## 2018-01-01 DIAGNOSIS — Z7901 Long term (current) use of anticoagulants: Secondary | ICD-10-CM | POA: Diagnosis not present

## 2018-01-01 DIAGNOSIS — I10 Essential (primary) hypertension: Secondary | ICD-10-CM | POA: Diagnosis not present

## 2018-01-01 DIAGNOSIS — S4992XA Unspecified injury of left shoulder and upper arm, initial encounter: Secondary | ICD-10-CM | POA: Diagnosis not present

## 2018-01-01 DIAGNOSIS — R489 Unspecified symbolic dysfunctions: Secondary | ICD-10-CM | POA: Diagnosis not present

## 2018-01-01 DIAGNOSIS — S0232XA Fracture of orbital floor, left side, initial encounter for closed fracture: Secondary | ICD-10-CM | POA: Diagnosis not present

## 2018-01-01 DIAGNOSIS — W01198A Fall on same level from slipping, tripping and stumbling with subsequent striking against other object, initial encounter: Secondary | ICD-10-CM | POA: Diagnosis not present

## 2018-01-01 DIAGNOSIS — M6250 Muscle wasting and atrophy, not elsewhere classified, unspecified site: Secondary | ICD-10-CM | POA: Diagnosis not present

## 2018-01-01 DIAGNOSIS — Z79899 Other long term (current) drug therapy: Secondary | ICD-10-CM | POA: Diagnosis not present

## 2018-01-01 DIAGNOSIS — I2510 Atherosclerotic heart disease of native coronary artery without angina pectoris: Secondary | ICD-10-CM | POA: Insufficient documentation

## 2018-01-01 DIAGNOSIS — Z96659 Presence of unspecified artificial knee joint: Secondary | ICD-10-CM | POA: Diagnosis not present

## 2018-01-01 DIAGNOSIS — S90511A Abrasion, right ankle, initial encounter: Secondary | ICD-10-CM | POA: Diagnosis not present

## 2018-01-01 DIAGNOSIS — Y999 Unspecified external cause status: Secondary | ICD-10-CM | POA: Diagnosis not present

## 2018-01-01 DIAGNOSIS — G309 Alzheimer's disease, unspecified: Secondary | ICD-10-CM | POA: Diagnosis not present

## 2018-01-01 DIAGNOSIS — W1839XA Other fall on same level, initial encounter: Secondary | ICD-10-CM | POA: Diagnosis not present

## 2018-01-01 DIAGNOSIS — R2991 Unspecified symptoms and signs involving the musculoskeletal system: Secondary | ICD-10-CM | POA: Diagnosis not present

## 2018-01-01 DIAGNOSIS — F0390 Unspecified dementia without behavioral disturbance: Secondary | ICD-10-CM | POA: Diagnosis not present

## 2018-01-01 DIAGNOSIS — W19XXXA Unspecified fall, initial encounter: Secondary | ICD-10-CM | POA: Diagnosis not present

## 2018-01-01 DIAGNOSIS — G8911 Acute pain due to trauma: Secondary | ICD-10-CM | POA: Diagnosis not present

## 2018-01-01 DIAGNOSIS — M7989 Other specified soft tissue disorders: Secondary | ICD-10-CM | POA: Diagnosis not present

## 2018-01-01 DIAGNOSIS — Y92098 Other place in other non-institutional residence as the place of occurrence of the external cause: Secondary | ICD-10-CM | POA: Insufficient documentation

## 2018-01-01 DIAGNOSIS — I482 Chronic atrial fibrillation: Secondary | ICD-10-CM | POA: Diagnosis not present

## 2018-01-01 DIAGNOSIS — S065X9A Traumatic subdural hemorrhage with loss of consciousness of unspecified duration, initial encounter: Secondary | ICD-10-CM | POA: Diagnosis not present

## 2018-01-01 DIAGNOSIS — Y9301 Activity, walking, marching and hiking: Secondary | ICD-10-CM | POA: Insufficient documentation

## 2018-01-01 DIAGNOSIS — I5022 Chronic systolic (congestive) heart failure: Secondary | ICD-10-CM | POA: Diagnosis not present

## 2018-01-01 DIAGNOSIS — M25562 Pain in left knee: Secondary | ICD-10-CM | POA: Diagnosis not present

## 2018-01-01 DIAGNOSIS — S299XXA Unspecified injury of thorax, initial encounter: Secondary | ICD-10-CM | POA: Diagnosis not present

## 2018-01-01 DIAGNOSIS — I110 Hypertensive heart disease with heart failure: Secondary | ICD-10-CM | POA: Diagnosis not present

## 2018-01-01 DIAGNOSIS — S065XAA Traumatic subdural hemorrhage with loss of consciousness status unknown, initial encounter: Secondary | ICD-10-CM

## 2018-01-01 DIAGNOSIS — Z7982 Long term (current) use of aspirin: Secondary | ICD-10-CM | POA: Diagnosis not present

## 2018-01-01 DIAGNOSIS — S199XXA Unspecified injury of neck, initial encounter: Secondary | ICD-10-CM | POA: Diagnosis not present

## 2018-01-01 DIAGNOSIS — E785 Hyperlipidemia, unspecified: Secondary | ICD-10-CM | POA: Diagnosis not present

## 2018-01-01 DIAGNOSIS — S0181XA Laceration without foreign body of other part of head, initial encounter: Secondary | ICD-10-CM | POA: Diagnosis not present

## 2018-01-01 DIAGNOSIS — S3993XA Unspecified injury of pelvis, initial encounter: Secondary | ICD-10-CM | POA: Diagnosis not present

## 2018-01-01 DIAGNOSIS — F5101 Primary insomnia: Secondary | ICD-10-CM | POA: Diagnosis not present

## 2018-01-01 DIAGNOSIS — Z96652 Presence of left artificial knee joint: Secondary | ICD-10-CM | POA: Diagnosis not present

## 2018-01-01 DIAGNOSIS — M6281 Muscle weakness (generalized): Secondary | ICD-10-CM | POA: Diagnosis not present

## 2018-01-01 DIAGNOSIS — Z95810 Presence of automatic (implantable) cardiac defibrillator: Secondary | ICD-10-CM | POA: Insufficient documentation

## 2018-01-01 DIAGNOSIS — I480 Paroxysmal atrial fibrillation: Secondary | ICD-10-CM | POA: Diagnosis not present

## 2018-01-01 DIAGNOSIS — S01111A Laceration without foreign body of right eyelid and periocular area, initial encounter: Secondary | ICD-10-CM | POA: Diagnosis not present

## 2018-01-01 DIAGNOSIS — I495 Sick sinus syndrome: Secondary | ICD-10-CM | POA: Diagnosis not present

## 2018-01-01 DIAGNOSIS — R531 Weakness: Secondary | ICD-10-CM | POA: Diagnosis not present

## 2018-01-01 DIAGNOSIS — E876 Hypokalemia: Secondary | ICD-10-CM | POA: Diagnosis not present

## 2018-01-01 DIAGNOSIS — I4891 Unspecified atrial fibrillation: Secondary | ICD-10-CM | POA: Diagnosis not present

## 2018-01-01 DIAGNOSIS — W19XXXD Unspecified fall, subsequent encounter: Secondary | ICD-10-CM | POA: Diagnosis not present

## 2018-01-01 DIAGNOSIS — S065X0A Traumatic subdural hemorrhage without loss of consciousness, initial encounter: Secondary | ICD-10-CM | POA: Diagnosis not present

## 2018-01-01 DIAGNOSIS — S8002XA Contusion of left knee, initial encounter: Secondary | ICD-10-CM | POA: Diagnosis not present

## 2018-01-01 DIAGNOSIS — N390 Urinary tract infection, site not specified: Secondary | ICD-10-CM | POA: Diagnosis not present

## 2018-01-01 DIAGNOSIS — S0282XA Fracture of other specified skull and facial bones, left side, initial encounter for closed fracture: Secondary | ICD-10-CM | POA: Diagnosis not present

## 2018-01-01 DIAGNOSIS — Z955 Presence of coronary angioplasty implant and graft: Secondary | ICD-10-CM | POA: Diagnosis not present

## 2018-01-01 DIAGNOSIS — I97638 Postprocedural hematoma of a circulatory system organ or structure following other circulatory system procedure: Secondary | ICD-10-CM | POA: Diagnosis not present

## 2018-01-01 DIAGNOSIS — I6200 Nontraumatic subdural hemorrhage, unspecified: Secondary | ICD-10-CM | POA: Diagnosis not present

## 2018-01-01 DIAGNOSIS — W1830XA Fall on same level, unspecified, initial encounter: Secondary | ICD-10-CM

## 2018-01-01 DIAGNOSIS — M1990 Unspecified osteoarthritis, unspecified site: Secondary | ICD-10-CM | POA: Diagnosis not present

## 2018-01-01 DIAGNOSIS — R52 Pain, unspecified: Secondary | ICD-10-CM | POA: Diagnosis not present

## 2018-01-01 DIAGNOSIS — E119 Type 2 diabetes mellitus without complications: Secondary | ICD-10-CM | POA: Diagnosis not present

## 2018-01-01 DIAGNOSIS — Z8673 Personal history of transient ischemic attack (TIA), and cerebral infarction without residual deficits: Secondary | ICD-10-CM | POA: Diagnosis not present

## 2018-01-01 DIAGNOSIS — S0230XA Fracture of orbital floor, unspecified side, initial encounter for closed fracture: Secondary | ICD-10-CM | POA: Diagnosis not present

## 2018-01-01 DIAGNOSIS — S0993XA Unspecified injury of face, initial encounter: Secondary | ICD-10-CM | POA: Diagnosis not present

## 2018-01-01 DIAGNOSIS — E559 Vitamin D deficiency, unspecified: Secondary | ICD-10-CM | POA: Diagnosis not present

## 2018-01-01 LAB — CBC WITH DIFFERENTIAL/PLATELET
BASOS ABS: 0.1 10*3/uL (ref 0–0.1)
Basophils Relative: 0 %
Eosinophils Absolute: 0 10*3/uL (ref 0–0.7)
Eosinophils Relative: 0 %
HEMATOCRIT: 44.9 % (ref 35.0–47.0)
HEMOGLOBIN: 15.1 g/dL (ref 12.0–16.0)
LYMPHS ABS: 1 10*3/uL (ref 1.0–3.6)
LYMPHS PCT: 7 %
MCH: 30.8 pg (ref 26.0–34.0)
MCHC: 33.7 g/dL (ref 32.0–36.0)
MCV: 91.2 fL (ref 80.0–100.0)
Monocytes Absolute: 0.7 10*3/uL (ref 0.2–0.9)
Monocytes Relative: 5 %
NEUTROS ABS: 13.5 10*3/uL — AB (ref 1.4–6.5)
Neutrophils Relative %: 88 %
PLATELETS: 201 10*3/uL (ref 150–440)
RBC: 4.92 MIL/uL (ref 3.80–5.20)
RDW: 14.3 % (ref 11.5–14.5)
WBC: 15.3 10*3/uL — ABNORMAL HIGH (ref 3.6–11.0)

## 2018-01-01 LAB — COMPREHENSIVE METABOLIC PANEL
ALK PHOS: 83 U/L (ref 38–126)
ALT: 16 U/L (ref 14–54)
AST: 28 U/L (ref 15–41)
Albumin: 4.7 g/dL (ref 3.5–5.0)
Anion gap: 13 (ref 5–15)
BILIRUBIN TOTAL: 1.1 mg/dL (ref 0.3–1.2)
BUN: 22 mg/dL — AB (ref 6–20)
CALCIUM: 9.3 mg/dL (ref 8.9–10.3)
CHLORIDE: 102 mmol/L (ref 101–111)
CO2: 24 mmol/L (ref 22–32)
CREATININE: 0.72 mg/dL (ref 0.44–1.00)
GFR calc non Af Amer: >60 mL/min (ref >60)
Glucose, Bld: 146 mg/dL — ABNORMAL HIGH (ref 65–99)
Potassium: 3.6 mmol/L (ref 3.5–5.1)
Sodium: 139 mmol/L (ref 135–145)
Total Protein: 7.3 g/dL (ref 6.5–8.1)

## 2018-01-01 MED ORDER — FENTANYL CITRATE (PF) 100 MCG/2ML IJ SOLN
25.0000 ug | INTRAMUSCULAR | Status: DC | PRN
  Administered 2018-01-01: 25 ug via INTRAVENOUS
  Filled 2018-01-01: qty 2

## 2018-01-01 MED ORDER — LIDOCAINE HCL (PF) 1 % IJ SOLN
INTRAMUSCULAR | Status: AC
  Filled 2018-01-01: qty 10

## 2018-01-01 MED ORDER — MIDAZOLAM HCL 5 MG/5ML IJ SOLN: 1.0000 mg | Freq: Once | INTRAMUSCULAR | Status: DC

## 2018-01-01 MED ORDER — MIDAZOLAM HCL 5 MG/5ML IJ SOLN
INTRAMUSCULAR | Status: AC
  Filled 2018-01-01: qty 5

## 2018-01-01 MED ORDER — LIDOCAINE-EPINEPHRINE-TETRACAINE (LET) SOLUTION
3.0000 mL | Freq: Once | NASAL | Status: AC
  Administered 2018-01-01: 3 mL via TOPICAL
  Filled 2018-01-01: qty 3

## 2018-01-01 MED ORDER — TETANUS-DIPHTH-ACELL PERTUSSIS 5-2.5-18.5 LF-MCG/0.5 IM SUSP
0.5000 mL | Freq: Once | INTRAMUSCULAR | Status: AC
  Administered 2018-01-01: 0.5 mL via INTRAMUSCULAR
  Filled 2018-01-01: qty 0.5

## 2018-01-01 MED ORDER — ACETAMINOPHEN 500 MG PO TABS
1000.0000 mg | ORAL_TABLET | Freq: Once | ORAL | Status: AC
  Administered 2018-01-01: 1000 mg via ORAL
  Filled 2018-01-01: qty 2

## 2018-01-01 MED ORDER — PROTHROMBIN COMPLEX CONC HUMAN 500 UNITS IV KIT
50.0000 [IU]/kg | PACK | Status: AC
  Administered 2018-01-01: 4500 [IU] via INTRAVENOUS
  Filled 2018-01-01: qty 4500

## 2018-01-01 MED ORDER — LIDOCAINE HCL (PF) 1 % IJ SOLN
10.0000 mL | Freq: Once | INTRAMUSCULAR | Status: AC
Start: 2018-01-01 — End: 2018-01-01
  Administered 2018-01-01: 10 mL via INTRADERMAL
  Filled 2018-01-01: qty 10

## 2018-01-01 MED ORDER — LEVETIRACETAM IN NACL 1000 MG/100ML IV SOLN
1000.0000 mg | Freq: Once | INTRAVENOUS | Status: AC
  Filled 2018-01-01: qty 100

## 2018-01-01 MED ORDER — ONDANSETRON HCL 4 MG/2ML IJ SOLN
4.0000 mg | Freq: Once | INTRAMUSCULAR | Status: AC
  Administered 2018-01-01: 4 mg via INTRAVENOUS

## 2018-01-01 MED ORDER — ONDANSETRON HCL 4 MG/2ML IJ SOLN
INTRAMUSCULAR | Status: AC
  Filled 2018-01-01: qty 2

## 2018-01-01 NOTE — ED Triage Notes (Signed)
Pt arrived via EMS from home due to fall. Pt went into doorway and tripped over chair. Family reported LOC to EMS but pt denies. Bruising and swelling to left knee and the left side of the face. Pt wearing c-collar. Pt A&O and c/o pain 8/10 in knee. Pt on Xarelto and has a pacemaker.

## 2018-01-01 NOTE — ED Provider Notes (Signed)
[facility] Emergency Department Provider Note    First MD Initiated Contact with Patient 01/01/18 1718     (approximate)  I have reviewed the triage vital signs and the nursing notes.   HISTORY  Chief Complaint Fall    HPI PERINA SALVAGGIO is a 84 y.o. female presents after mechanical fall at home.  Patient was walking from her apartment to her daughter's part of the home was stepping through a doorway and tripped over a chair fell hitting the wall with her left face and hitting the ground with her left knee.  Family reported LOC but the patient denies any.  Denies any numbness tingling but does have moderate to severe left knee pain and left face pain.  She is on Xarelto and has a history of cardiac disease.  Past Medical History:  Diagnosis Date  . 6949-lead 11/11/2013  . Allergy   . CHF (congestive heart failure) (HCC)    class 2  . Chronic atrial fibrillation ([place])   . Coronary artery disease   . Degenerative joint disease    severe  . Dyslipidemia   . Hyperlipidemia   . ICD-Medtronic 05/10/2009   Qualifier: Diagnosis of  By: Lovena Le, MD, Martyn Malay   . Nonischemic cardiomyopathy [facility])    Family History  Problem Relation Age of Onset  . Heart attack Father   . Cancer Mother        colon  . Hypertension Sister   . Aneurysm Sister   . Aneurysm Maternal Grandmother   . Cancer Sister        rectal   Past Surgical History:  Procedure Laterality Date  . ICD GENERATOR CHANGEOUT N/A 12/06/2016   Procedure: ICD Generator Changeout;  Surgeon: Deboraha Sprang, MD;  Location: [facility];  Service: Cardiovascular;  Laterality: N/A;  . Medtronic dual-chamber ICD  01/05/2006   generator change April 2012  . PCI stent    . REPLACEMENT TOTAL KNEE     Patient Active Problem List   Diagnosis Date Noted  . Vitamin D deficiency 09/24/2017  . Depression, major, single episode, mild ([place]) 06/22/2017  . Advanced care planning/counseling  discussion 03/14/2017  . Acute anxiety 09/12/2016  . Lower leg edema 03/03/2016  . Skin lesion of hand 03/03/2016  . Dementia 03/03/2016  . Weakness 01/25/2016  . DNR (do not resuscitate) 01/10/2016  . SOB (shortness of breath) 01/07/2016  . Essential hypertension, benign 02/15/2015  . Hypercholesterolemia 02/15/2015  . Arthritis, degenerative 05/06/2014  . 6949-lead 11/11/2013  . Dilated cardiomyopathy ([place]) 05/10/2009  . ATRIAL FIBRILLATION 05/10/2009  . Chronic systolic heart failure ([place]) 05/10/2009  . Implantable cardioverter-defibrillator (ICD) in situ 05/10/2009  . Automatic implantable cardioverter-defibrillator in situ 05/10/2009      Prior to Admission medications   Medication Sig Start Date End Date Taking? Authorizing Provider  amLODipine (NORVASC) 5 MG tablet TAKE 1 TABLET BY MOUTH  DAILY 10/24/17   Minna Merritts, MD  Cholecalciferol (VITAMIN D) 2000 units tablet Take 2,000 Units by mouth daily.    [provider]  ENSURE PLUS (ENSURE PLUS) LIQD Take 237 mLs by mouth.    [provider]  furosemide (LASIX) 40 MG tablet TAKE 1 TABLET BY MOUTH  DAILY WITH LUNCH 10/24/17   Minna Merritts, MD  losartan (COZAAR) 50 MG tablet TAKE 1 TABLET BY MOUTH  DAILY 10/24/17   Minna Merritts, MD  meloxicam (MOBIC) 15 MG tablet Take 1 tablet (15 mg  total) by mouth daily. 06/22/17   Kathrine Haddock, NP  Probiotic Product (ALIGN) 4 MG CAPS Take 4 mg by mouth daily.     [provider]  rivaroxaban (XARELTO) 20 MG TABS tablet Take 1 tablet (20 mg total) by mouth daily with supper. 05/17/17   Minna Merritts, MD  rosuvastatin (CRESTOR) 10 MG tablet TAKE 1 TABLET BY MOUTH  DAILY 10/24/17   Kathrine Haddock, NP  sertraline (ZOLOFT) 50 MG tablet Take 1 tablet (50 mg total) by mouth daily. 06/22/17   Kathrine Haddock, NP    Allergies Clonazepam; Fluoxetine hcl; Lisinopril; and Zolpidem tartrate    Social History Social History   Tobacco Use  . Smoking  status: Never Smoker  . Smokeless tobacco: Never Used  Substance Use Topics  . Alcohol use: No  . Drug use: No    Review of Systems Patient denies headaches, rhinorrhea, blurry vision, numbness, shortness of breath, chest pain, edema, cough, abdominal pain, nausea, vomiting, diarrhea, dysuria, fevers, rashes or hallucinations unless otherwise stated above in HPI. ____________________________________________   PHYSICAL EXAM:  VITAL SIGNS: Vitals:   01/01/18 1719 01/01/18 1730  BP:  104/78  Pulse:  76  Resp: 19 13  Temp:    SpO2:  94%    Constitutional: Alert and oriented.in no acute distress. Eyes: Conjunctivae are normal.  Head: large left periorbital ecchymosis and swelling without proptosis, eomi, mid face stable to palpation, Nose: No congestion/rhinnorhea. Mouth/Throat: Mucous membranes are moist.   Neck: No stridor. No step off or deformities Cardiovascular: Normal rate, regular rhythm. Grossly normal heart sounds.  Good peripheral circulation. Respiratory: Normal respiratory effort.  No retractions. Lungs CTAB. Gastrointestinal: Soft and nontender. No distention. No abdominal bruits. No CVA tenderness. Genitourinarydeferred Musculoskeletal: left prepatellar area with ttp and ecchymosis, no hip or tib/fib or foot pain bilaterally. Neurologic:  Normal speech and language. No gross focal neurologic deficits are appreciated. No facial droop Skin:  3 cm superficial linear laceration to left eyebrow Psychiatric: Mood and affect are normal. Speech and behavior are normal.  ____________________________________________   LABS (all labs ordered are listed, but only abnormal results are displayed)  Results for orders placed or performed during the hospital encounter of 01/01/18 (from the past 24 hour(s))  CBC with Differential/Platelet     Status: Abnormal   Collection Time: 01/01/18  5:48 PM  Result Value Ref Range   WBC 15.3 (H) 3.6 - 11.0 K/uL   RBC 4.92 3.80 - 5.20  MIL/uL   Hemoglobin 15.1 12.0 - 16.0 g/dL   HCT 44.9 35.0 - 47.0 %   MCV 91.2 80.0 - 100.0 fL   MCH 30.8 26.0 - 34.0 pg   MCHC 33.7 32.0 - 36.0 g/dL   RDW 14.3 11.5 - 14.5 %   Platelets 201 150 - 440 K/uL   Neutrophils Relative % 88 %   Neutro Abs 13.5 (H) 1.4 - 6.5 K/uL   Lymphocytes Relative 7 %   Lymphs Abs 1.0 1.0 - 3.6 K/uL   Monocytes Relative 5 %   Monocytes Absolute 0.7 0.2 - 0.9 K/uL   Eosinophils Relative 0 %   Eosinophils Absolute 0.0 0 - 0.7 K/uL   Basophils Relative 0 %   Basophils Absolute 0.1 0 - 0.1 K/uL   ____________________________________________ ____________________________________________  RADIOLOGY  I personally reviewed all radiographic images ordered to evaluate for the above acute complaints and reviewed radiology reports and findings.  These findings were personally discussed with the patient.  Please see medical record  for radiology report.  ____________________________________________   PROCEDURES  Procedure(s) performed:  .Critical Care Performed by: Merlyn Lot, MD Authorized by: Merlyn Lot, MD   Critical care provider statement:    Critical care time (minutes):  30   Critical care time was exclusive of:  Separately billable procedures and treating other patients   Critical care was necessary to treat or prevent imminent or life-threatening deterioration of the following conditions:  Trauma   Critical care was time spent personally by me on the following activities:  Development of treatment plan with patient or surrogate, discussions with consultants, evaluation of patient's response to treatment, examination of patient, obtaining history from patient or surrogate, ordering and performing treatments and interventions, ordering and review of laboratory studies, ordering and review of radiographic studies, pulse oximetry, re-evaluation of patient's condition and review of old charts .Marland KitchenLaceration Repair Date/Time: 01/01/2018 7:45  PM Performed by: Merlyn Lot, MD Authorized by: Merlyn Lot, MD   Consent:    Consent obtained:  Verbal   Consent given by:  Patient   Risks discussed:  Infection, pain, retained foreign body, poor cosmetic result and poor wound healing Anesthesia (see MAR for exact dosages):    Anesthesia method:  Local infiltration   Local anesthetic:  Lidocaine 1% w/o epi Laceration details:    Location:  Face   Face location:  L eyebrow   Length (cm):  3   Depth (mm):  5 Repair type:    Repair type:  Intermediate Exploration:    Hemostasis achieved with:  Direct pressure   Wound exploration: entire depth of wound probed and visualized     Contaminated: no   Treatment:    Area cleansed with:  Saline and Betadine   Amount of cleaning:  Extensive   Irrigation solution:  Sterile saline   Visualized foreign bodies/material removed: no   Skin repair:    Repair method:  Sutures Approximation:    Approximation:  Close Post-procedure details:    Dressing:  Sterile dressing   Patient tolerance of procedure:  Tolerated well, no immediate complications      Critical Care performed: yes ____________________________________________   INITIAL IMPRESSION / ASSESSMENT AND PLAN / ED COURSE  Pertinent labs & imaging results that were available during my care of the patient were reviewed by me and considered in my medical decision making (see chart for details).  DDX: sah, sdh, edh, fracture, contusion, soft tissue injury, viscous injury, concussion, hemorrhage   Torrie Namba Barbara is a 84 y.o. who presents to the ED with round level fall as described above.  Obvious facial injuries and laceration as described above.  As the patient's on Xarelto will order x-rays CT imaging for the above injuries above differential.  Abdominal exam is soft and benign no thoracic trauma.  No proptosis and extraocular motions are intact.  Clinical Course as of Jan 02 1952  Tue Jan 01, 2018  1907  Patient with evidence of subdural hematoma and facial injuries as described above.  Spoke with neurosurgery on-call who agrees with reversal of Xarelto and transfer to [facility] for neurosurgical consultation frequent neuro checks and hemodynamic monitoring.  Have discussed with the patient and available family all diagnostics and treatments performed thus far and all questions were answered to the best of my ability. The patient demonstrates understanding and agreement with plan.    [PR]    Clinical Course User Index [PR] Merlyn Lot, MD     As part of my medical decision making, I  reviewed the following data within the [place] notes reviewed and incorporated, Labs reviewed, notes from prior ED visits and [place] Controlled Substance Database   ____________________________________________   FINAL CLINICAL IMPRESSION(S) / ED DIAGNOSES  Final diagnoses:  Subdural hematoma (HCC)  Facial laceration, initial encounter  Fall from ground level      NEW MEDICATIONS STARTED DURING THIS VISIT:  New Prescriptions   No medications on file     Note:  This document was prepared using Dragon voice recognition software and may include unintentional dictation errors.    Merlyn Lot, MD 01/01/18 [phone]

## 2018-01-01 NOTE — ED Notes (Signed)
Pt left with [employer]. No apparent distress noted. Valuables sent home with family.

## 2018-01-03 DIAGNOSIS — M6281 Muscle weakness (generalized): Secondary | ICD-10-CM | POA: Diagnosis not present

## 2018-01-03 DIAGNOSIS — I482 Chronic atrial fibrillation: Secondary | ICD-10-CM | POA: Diagnosis not present

## 2018-01-03 DIAGNOSIS — E559 Vitamin D deficiency, unspecified: Secondary | ICD-10-CM | POA: Diagnosis not present

## 2018-01-03 DIAGNOSIS — I97638 Postprocedural hematoma of a circulatory system organ or structure following other circulatory system procedure: Secondary | ICD-10-CM | POA: Diagnosis not present

## 2018-01-03 DIAGNOSIS — S065X9D Traumatic subdural hemorrhage with loss of consciousness of unspecified duration, subsequent encounter: Secondary | ICD-10-CM | POA: Diagnosis not present

## 2018-01-03 DIAGNOSIS — M6250 Muscle wasting and atrophy, not elsewhere classified, unspecified site: Secondary | ICD-10-CM | POA: Diagnosis not present

## 2018-01-03 DIAGNOSIS — I2510 Atherosclerotic heart disease of native coronary artery without angina pectoris: Secondary | ICD-10-CM | POA: Diagnosis not present

## 2018-01-03 DIAGNOSIS — S065X9A Traumatic subdural hemorrhage with loss of consciousness of unspecified duration, initial encounter: Secondary | ICD-10-CM | POA: Diagnosis not present

## 2018-01-03 DIAGNOSIS — W19XXXA Unspecified fall, initial encounter: Secondary | ICD-10-CM | POA: Diagnosis not present

## 2018-01-03 DIAGNOSIS — W19XXXD Unspecified fall, subsequent encounter: Secondary | ICD-10-CM | POA: Diagnosis not present

## 2018-01-03 DIAGNOSIS — I6200 Nontraumatic subdural hemorrhage, unspecified: Secondary | ICD-10-CM | POA: Diagnosis not present

## 2018-01-03 DIAGNOSIS — R21 Rash and other nonspecific skin eruption: Secondary | ICD-10-CM | POA: Diagnosis not present

## 2018-01-03 DIAGNOSIS — I480 Paroxysmal atrial fibrillation: Secondary | ICD-10-CM | POA: Diagnosis not present

## 2018-01-03 DIAGNOSIS — E876 Hypokalemia: Secondary | ICD-10-CM | POA: Diagnosis not present

## 2018-01-03 DIAGNOSIS — F5101 Primary insomnia: Secondary | ICD-10-CM | POA: Diagnosis not present

## 2018-01-03 DIAGNOSIS — G8911 Acute pain due to trauma: Secondary | ICD-10-CM | POA: Diagnosis not present

## 2018-01-03 DIAGNOSIS — I10 Essential (primary) hypertension: Secondary | ICD-10-CM | POA: Diagnosis not present

## 2018-01-03 DIAGNOSIS — E785 Hyperlipidemia, unspecified: Secondary | ICD-10-CM | POA: Diagnosis not present

## 2018-01-03 DIAGNOSIS — R489 Unspecified symbolic dysfunctions: Secondary | ICD-10-CM | POA: Diagnosis not present

## 2018-01-03 DIAGNOSIS — R2991 Unspecified symptoms and signs involving the musculoskeletal system: Secondary | ICD-10-CM | POA: Diagnosis not present

## 2018-01-03 DIAGNOSIS — N390 Urinary tract infection, site not specified: Secondary | ICD-10-CM | POA: Diagnosis not present

## 2018-01-03 DIAGNOSIS — S0232XA Fracture of orbital floor, left side, initial encounter for closed fracture: Secondary | ICD-10-CM | POA: Diagnosis not present

## 2018-01-03 DIAGNOSIS — E119 Type 2 diabetes mellitus without complications: Secondary | ICD-10-CM | POA: Diagnosis not present

## 2018-01-03 DIAGNOSIS — S0232XD Fracture of orbital floor, left side, subsequent encounter for fracture with routine healing: Secondary | ICD-10-CM | POA: Diagnosis not present

## 2018-01-03 DIAGNOSIS — R52 Pain, unspecified: Secondary | ICD-10-CM | POA: Diagnosis not present

## 2018-01-03 DIAGNOSIS — I4891 Unspecified atrial fibrillation: Secondary | ICD-10-CM | POA: Diagnosis not present

## 2018-01-03 DIAGNOSIS — R531 Weakness: Secondary | ICD-10-CM | POA: Diagnosis not present

## 2018-01-05 DIAGNOSIS — S0232XA Fracture of orbital floor, left side, initial encounter for closed fracture: Secondary | ICD-10-CM | POA: Diagnosis not present

## 2018-01-05 DIAGNOSIS — I4891 Unspecified atrial fibrillation: Secondary | ICD-10-CM | POA: Diagnosis not present

## 2018-01-05 DIAGNOSIS — I10 Essential (primary) hypertension: Secondary | ICD-10-CM | POA: Diagnosis not present

## 2018-01-05 DIAGNOSIS — I2510 Atherosclerotic heart disease of native coronary artery without angina pectoris: Secondary | ICD-10-CM | POA: Diagnosis not present

## 2018-01-05 DIAGNOSIS — I6200 Nontraumatic subdural hemorrhage, unspecified: Secondary | ICD-10-CM | POA: Diagnosis not present

## 2018-01-09 ENCOUNTER — Other Ambulatory Visit: Payer: Self-pay | Admitting: Physician Assistant

## 2018-01-09 DIAGNOSIS — S065X9D Traumatic subdural hemorrhage with loss of consciousness of unspecified duration, subsequent encounter: Secondary | ICD-10-CM | POA: Diagnosis not present

## 2018-01-09 DIAGNOSIS — S065X9A Traumatic subdural hemorrhage with loss of consciousness of unspecified duration, initial encounter: Secondary | ICD-10-CM

## 2018-01-09 DIAGNOSIS — S065XAA Traumatic subdural hemorrhage with loss of consciousness status unknown, initial encounter: Secondary | ICD-10-CM

## 2018-01-10 DIAGNOSIS — I4891 Unspecified atrial fibrillation: Secondary | ICD-10-CM | POA: Diagnosis not present

## 2018-01-10 DIAGNOSIS — I6200 Nontraumatic subdural hemorrhage, unspecified: Secondary | ICD-10-CM | POA: Diagnosis not present

## 2018-01-10 DIAGNOSIS — I2510 Atherosclerotic heart disease of native coronary artery without angina pectoris: Secondary | ICD-10-CM | POA: Diagnosis not present

## 2018-01-10 DIAGNOSIS — S0232XD Fracture of orbital floor, left side, subsequent encounter for fracture with routine healing: Secondary | ICD-10-CM | POA: Diagnosis not present

## 2018-01-10 DIAGNOSIS — I10 Essential (primary) hypertension: Secondary | ICD-10-CM | POA: Diagnosis not present

## 2018-01-11 LAB — CUP PACEART REMOTE DEVICE CHECK
Brady Statistic AP VS Percent: 0 %
Brady Statistic AS VS Percent: 0 %
Date Time Interrogation Session: 20190423073623
HIGH POWER IMPEDANCE MEASURED VALUE: 55 Ohm
HighPow Impedance: 47 Ohm
Implantable Lead Implant Date: 20070511
Implantable Lead Location: 753860
Lead Channel Impedance Value: 399 Ohm
Lead Channel Pacing Threshold Pulse Width: 0.4 ms
Lead Channel Sensing Intrinsic Amplitude: 0.75 mV
Lead Channel Setting Pacing Amplitude: 2.5 V
Lead Channel Setting Pacing Pulse Width: 0.4 ms
MDC IDC LEAD IMPLANT DT: 20040220
MDC IDC LEAD IMPLANT DT: 20070426
MDC IDC LEAD LOCATION: 753859
MDC IDC LEAD LOCATION: 753860
MDC IDC MSMT BATTERY REMAINING LONGEVITY: 121 mo
MDC IDC MSMT BATTERY VOLTAGE: 3.02 V
MDC IDC MSMT LEADCHNL RA IMPEDANCE VALUE: 399 Ohm
MDC IDC MSMT LEADCHNL RA SENSING INTR AMPL: 0.375 mV
MDC IDC MSMT LEADCHNL RV IMPEDANCE VALUE: 494 Ohm
MDC IDC MSMT LEADCHNL RV PACING THRESHOLD AMPLITUDE: 0.625 V
MDC IDC MSMT LEADCHNL RV SENSING INTR AMPL: 0.625 mV
MDC IDC MSMT LEADCHNL RV SENSING INTR AMPL: 0.625 mV
MDC IDC PG IMPLANT DT: 20180411
MDC IDC SET LEADCHNL RV SENSING SENSITIVITY: 0.3 mV
MDC IDC STAT BRADY AP VP PERCENT: 0 %
MDC IDC STAT BRADY AS VP PERCENT: 0 %
MDC IDC STAT BRADY RA PERCENT PACED: 0 %
MDC IDC STAT BRADY RV PERCENT PACED: 18.1 %

## 2018-01-14 ENCOUNTER — Other Ambulatory Visit: Payer: Self-pay | Admitting: Unknown Physician Specialty

## 2018-01-15 ENCOUNTER — Ambulatory Visit
Admission: RE | Admit: 2018-01-15 | Discharge: 2018-01-15 | Disposition: A | Discharge status: Home / Self Care | Payer: Medicare Other | Source: Ambulatory Visit | Attending: Physician Assistant | Admitting: Physician Assistant

## 2018-01-15 DIAGNOSIS — X58XXXA Exposure to other specified factors, initial encounter: Secondary | ICD-10-CM | POA: Diagnosis not present

## 2018-01-15 DIAGNOSIS — S065X9A Traumatic subdural hemorrhage with loss of consciousness of unspecified duration, initial encounter: Secondary | ICD-10-CM | POA: Insufficient documentation

## 2018-01-15 DIAGNOSIS — I6200 Nontraumatic subdural hemorrhage, unspecified: Secondary | ICD-10-CM | POA: Diagnosis not present

## 2018-01-15 DIAGNOSIS — S065XAA Traumatic subdural hemorrhage with loss of consciousness status unknown, initial encounter: Secondary | ICD-10-CM

## 2018-01-15 NOTE — Telephone Encounter (Signed)
Please advise 

## 2018-01-15 NOTE — Telephone Encounter (Signed)
I just refused a Meloxicam refill for this pt.  Please let her know that it's risky taking this plus Xarelto due to risk of GI bleeding.[name]

## 2018-01-15 NOTE — Telephone Encounter (Signed)
Meloxicam refill Last OV: 04/24/16 Last Refill:06/22/17 #90 tab 1 RF Pharmacy:[facility] [place] PCP: Kathrine Haddock NP

## 2018-01-16 DIAGNOSIS — S0232XD Fracture of orbital floor, left side, subsequent encounter for fracture with routine healing: Secondary | ICD-10-CM | POA: Diagnosis not present

## 2018-01-16 DIAGNOSIS — S0232XA Fracture of orbital floor, left side, initial encounter for closed fracture: Secondary | ICD-10-CM | POA: Diagnosis not present

## 2018-01-16 NOTE — Telephone Encounter (Signed)
Called and let patient's daughter know about medication. She verbalized understanding and states that a doctor at [facility] already took her off of meloxicam.

## 2018-01-16 NOTE — Telephone Encounter (Signed)
Tylenol is OK for her

## 2018-01-16 NOTE — Telephone Encounter (Signed)
Are there any OTC options for the patient incase they ask when I call them?

## 2018-01-17 DIAGNOSIS — S0232XD Fracture of orbital floor, left side, subsequent encounter for fracture with routine healing: Secondary | ICD-10-CM | POA: Diagnosis not present

## 2018-01-24 DIAGNOSIS — M19012 Primary osteoarthritis, left shoulder: Secondary | ICD-10-CM | POA: Diagnosis not present

## 2018-01-24 DIAGNOSIS — M75102 Unspecified rotator cuff tear or rupture of left shoulder, not specified as traumatic: Secondary | ICD-10-CM | POA: Diagnosis not present

## 2018-01-24 DIAGNOSIS — S8002XA Contusion of left knee, initial encounter: Secondary | ICD-10-CM | POA: Diagnosis not present

## 2018-01-25 ENCOUNTER — Encounter: Payer: Self-pay | Admitting: Unknown Physician Specialty

## 2018-01-25 ENCOUNTER — Ambulatory Visit (INDEPENDENT_AMBULATORY_CARE_PROVIDER_SITE_OTHER): Payer: Medicare Other | Admitting: Unknown Physician Specialty

## 2018-01-25 VITALS — BP 154/90 | HR 84 | Temp 97.8°F | Ht 60.0 in | Wt 182.4 lb

## 2018-01-25 DIAGNOSIS — Z66 Do not resuscitate: Secondary | ICD-10-CM | POA: Diagnosis not present

## 2018-01-25 DIAGNOSIS — I481 Persistent atrial fibrillation: Secondary | ICD-10-CM | POA: Diagnosis not present

## 2018-01-25 DIAGNOSIS — S0280XA Fracture of other specified skull and facial bones, unspecified side, initial encounter for closed fracture: Secondary | ICD-10-CM | POA: Diagnosis not present

## 2018-01-25 DIAGNOSIS — R531 Weakness: Secondary | ICD-10-CM

## 2018-01-25 DIAGNOSIS — S0285XD Fracture of orbit, unspecified, subsequent encounter for fracture with routine healing: Secondary | ICD-10-CM

## 2018-01-25 DIAGNOSIS — S0285XA Fracture of orbit, unspecified, initial encounter for closed fracture: Secondary | ICD-10-CM | POA: Insufficient documentation

## 2018-01-25 DIAGNOSIS — I10 Essential (primary) hypertension: Secondary | ICD-10-CM

## 2018-01-25 DIAGNOSIS — I4819 Other persistent atrial fibrillation: Secondary | ICD-10-CM

## 2018-01-25 DIAGNOSIS — S81801A Unspecified open wound, right lower leg, initial encounter: Secondary | ICD-10-CM

## 2018-01-25 NOTE — Assessment & Plan Note (Signed)
Discussed Xaralto and risks/benefits.  Continue for now

## 2018-01-25 NOTE — Assessment & Plan Note (Signed)
continue with conservative care.  Following with ENT

## 2018-01-25 NOTE — Assessment & Plan Note (Signed)
Stable, continue present medications.   

## 2018-01-25 NOTE — Progress Notes (Signed)
BP (!) 154/90   Pulse 84   Temp 97.8 F (36.6 C) (Oral)   Ht 5' (1.524 m)   Wt 182 lb 6.4 oz (82.7 kg)   LMP  (LMP Unknown)   SpO2 96%   BMI 35.62 kg/m    Subjective:    Patient ID: Barbara Blackburn, female    DOB: 06/27/1929, 84 y.o.   MRN: [id number]  HPI: Barbara Blackburn is a 84 y.o. female  Chief Complaint  Patient presents with  . Follow-up    pt had a fall 01/01/18, went to [facility] for rehab   Pt comes in with daughter who assists with part of the history.  Pt discharged from the hospital on 5/7 after a 3 days stay following a fall.  She sustained a subdural hematoma and lift orbital fracture.  She was transferred to SNF for rehab.  Discharged from rehab on 5/20.  Note on 5/22 from [facility] saying she elects  No surgery for orbital fracture due to no visual difficulties.  Today left knee is bothering a little and warm to touch.  Saw Dr. Marry Guan when she also had her left shoulder checked out.    Memory initially worse in the ER and skilled nursing.  Daughter states she is getting better.    Relevant past medical, surgical, family and social history reviewed and updated as indicated. Interim medical history since our last visit reviewed. Allergies and medications reviewed and updated.  Review of Systems  Per HPI unless specifically indicated above     Objective:    BP (!) 154/90   Pulse 84   Temp 97.8 F (36.6 C) (Oral)   Ht 5' (1.524 m)   Wt 182 lb 6.4 oz (82.7 kg)   LMP  (LMP Unknown)   SpO2 96%   BMI 35.62 kg/m   Wt Readings from Last 3 Encounters:  01/25/18 182 lb 6.4 oz (82.7 kg)  01/01/18 198 lb 6.6 oz (90 kg)  09/24/17 181 lb 9.6 oz (82.4 kg)    Physical Exam  Constitutional: She is oriented to person, place, and time. She appears well-developed and well-nourished. No distress.  HENT:  Head: Normocephalic and atraumatic.  Eyes: Conjunctivae and lids are normal. Right eye exhibits no discharge. Left eye exhibits no discharge. No scleral icterus.    Neck: Normal range of motion. Neck supple. No JVD present. Carotid bruit is not present.  Cardiovascular: Normal rate, regular rhythm and normal heart sounds.  Pulmonary/Chest: Effort normal and breath sounds normal.  Abdominal: Normal appearance. There is no splenomegaly or hepatomegaly.  Musculoskeletal: Normal range of motion.  Neurological: She is alert and oriented to person, place, and time.  Skin: Skin is intact. No rash noted. No pallor.  Large right lower leg abrasion.  Psychiatric: She has a normal mood and affect. Her behavior is normal. Judgment and thought content normal.    Results for orders placed or performed during the hospital encounter of 01/01/18  CBC with Differential/Platelet  Result Value Ref Range   WBC 15.3 (H) 3.6 - 11.0 K/uL   RBC 4.92 3.80 - 5.20 MIL/uL   Hemoglobin 15.1 12.0 - 16.0 g/dL   HCT 44.9 35.0 - 47.0 %   MCV 91.2 80.0 - 100.0 fL   MCH 30.8 26.0 - 34.0 pg   MCHC 33.7 32.0 - 36.0 g/dL   RDW 14.3 11.5 - 14.5 %   Platelets 201 150 - 440 K/uL   Neutrophils Relative % 88 %  Neutro Abs 13.5 (H) 1.4 - 6.5 K/uL   Lymphocytes Relative 7 %   Lymphs Abs 1.0 1.0 - 3.6 K/uL   Monocytes Relative 5 %   Monocytes Absolute 0.7 0.2 - 0.9 K/uL   Eosinophils Relative 0 %   Eosinophils Absolute 0.0 0 - 0.7 K/uL   Basophils Relative 0 %   Basophils Absolute 0.1 0 - 0.1 K/uL  Comprehensive metabolic panel  Result Value Ref Range   Sodium 139 135 - 145 mmol/L   Potassium 3.6 3.5 - 5.1 mmol/L   Chloride 102 101 - 111 mmol/L   CO2 24 22 - 32 mmol/L   Glucose, Bld 146 (H) 65 - 99 mg/dL   BUN 22 (H) 6 - 20 mg/dL   Creatinine, Ser 0.72 0.44 - 1.00 mg/dL   Calcium 9.3 8.9 - 10.3 mg/dL   Total Protein 7.3 6.5 - 8.1 g/dL   Albumin 4.7 3.5 - 5.0 g/dL   AST 28 15 - 41 U/L   ALT 16 14 - 54 U/L   Alkaline Phosphatase 83 38 - 126 U/L   Total Bilirubin 1.1 0.3 - 1.2 mg/dL   GFR calc non Af Amer >60 >60 mL/min   GFR calc Af Amer >60 >60 mL/min   Anion gap 13 5 -  15      Assessment & Plan:   Problem List Items Addressed This Visit      Unprioritized   ATRIAL FIBRILLATION    Discussed Xaralto and risks/benefits.  Continue for now      DNR (do not resuscitate)    Pt wishes to make no changes.  She states she does not want to be resuscitated.        Essential hypertension, benign    Stable, continue present medications.        Orbital fracture ([place])    continue with conservative care.  Following with ENT      Weakness    Arrange for home PT      Relevant Orders   Ambulatory referral to [facility]    Other Visit Diagnoses    Wound of right lower extremity, initial encounter    -  Primary   Refer to wound clinic.  Call skilled nursing for further management.     Relevant Orders   Ambulatory referral to [facility] referral to wound care center       Follow up plan: Return in about 1 month (around 02/22/2018).

## 2018-01-25 NOTE — Assessment & Plan Note (Signed)
Arrange for home PT

## 2018-01-25 NOTE — Assessment & Plan Note (Signed)
Pt wishes to make no changes.  She states she does not want to be resuscitated.

## 2018-01-30 ENCOUNTER — Telehealth: Payer: Self-pay | Admitting: Unknown Physician Specialty

## 2018-01-30 DIAGNOSIS — S0232XD Fracture of orbital floor, left side, subsequent encounter for fracture with routine healing: Secondary | ICD-10-CM | POA: Diagnosis not present

## 2018-01-30 DIAGNOSIS — I509 Heart failure, unspecified: Secondary | ICD-10-CM | POA: Diagnosis not present

## 2018-01-30 DIAGNOSIS — I110 Hypertensive heart disease with heart failure: Secondary | ICD-10-CM | POA: Diagnosis not present

## 2018-01-30 DIAGNOSIS — I4891 Unspecified atrial fibrillation: Secondary | ICD-10-CM | POA: Diagnosis not present

## 2018-01-30 DIAGNOSIS — Z7901 Long term (current) use of anticoagulants: Secondary | ICD-10-CM | POA: Diagnosis not present

## 2018-01-30 DIAGNOSIS — E785 Hyperlipidemia, unspecified: Secondary | ICD-10-CM | POA: Diagnosis not present

## 2018-01-30 DIAGNOSIS — S81811D Laceration without foreign body, right lower leg, subsequent encounter: Secondary | ICD-10-CM | POA: Diagnosis not present

## 2018-01-30 DIAGNOSIS — S065X9D Traumatic subdural hemorrhage with loss of consciousness of unspecified duration, subsequent encounter: Secondary | ICD-10-CM | POA: Diagnosis not present

## 2018-01-30 DIAGNOSIS — Z95810 Presence of automatic (implantable) cardiac defibrillator: Secondary | ICD-10-CM | POA: Diagnosis not present

## 2018-01-30 NOTE — Telephone Encounter (Signed)
Yes to a wound care order.  What home health suggests is fine

## 2018-01-30 NOTE — Telephone Encounter (Signed)
Copied from [facility] [place] [id number]. Topic: Quick Communication - See Telephone Encounter >> Jan 30, 2018  9:41 AM Robina Ade, Helene Kelp D wrote: CRM for notification. See Telephone encounter for: 01/30/18. Duncan Dull called to request wound care order for patient and her call back number is [phone]. She would like a call as soon as possible today, thanks.

## 2018-01-30 NOTE — Telephone Encounter (Signed)
Called and let Vivien Rota with [facility] know that Malachy Mood gave the OK for verbal orders for wound care.

## 2018-01-31 ENCOUNTER — Telehealth: Payer: Self-pay

## 2018-01-31 DIAGNOSIS — I509 Heart failure, unspecified: Secondary | ICD-10-CM | POA: Diagnosis not present

## 2018-01-31 DIAGNOSIS — Z7901 Long term (current) use of anticoagulants: Secondary | ICD-10-CM | POA: Diagnosis not present

## 2018-01-31 DIAGNOSIS — E785 Hyperlipidemia, unspecified: Secondary | ICD-10-CM | POA: Diagnosis not present

## 2018-01-31 DIAGNOSIS — S81811D Laceration without foreign body, right lower leg, subsequent encounter: Secondary | ICD-10-CM | POA: Diagnosis not present

## 2018-01-31 DIAGNOSIS — S0232XD Fracture of orbital floor, left side, subsequent encounter for fracture with routine healing: Secondary | ICD-10-CM | POA: Diagnosis not present

## 2018-01-31 DIAGNOSIS — Z95810 Presence of automatic (implantable) cardiac defibrillator: Secondary | ICD-10-CM | POA: Diagnosis not present

## 2018-01-31 DIAGNOSIS — I4891 Unspecified atrial fibrillation: Secondary | ICD-10-CM | POA: Diagnosis not present

## 2018-01-31 DIAGNOSIS — S065X9D Traumatic subdural hemorrhage with loss of consciousness of unspecified duration, subsequent encounter: Secondary | ICD-10-CM | POA: Diagnosis not present

## 2018-01-31 DIAGNOSIS — I110 Hypertensive heart disease with heart failure: Secondary | ICD-10-CM | POA: Diagnosis not present

## 2018-01-31 NOTE — Telephone Encounter (Signed)
If home health can manage it, that would be fine

## 2018-01-31 NOTE — Telephone Encounter (Signed)
Copied from [place] [id number]. Topic: Referral - Question >> Jan 31, 2018  8:35 AM Synthia Innocent wrote: Reason for CRM: Patient is scheduled for wound care tomorrow at wound center, patient is also already set up for wound care through home health. Is it needed for her to go to wound center or just home health? Please advise  >> Jan 31, 2018  8:45 AM Don Perking M wrote: Can you help me with this?   Routing to provider. Please advise.

## 2018-02-01 ENCOUNTER — Encounter: Payer: Self-pay | Admitting: Family Medicine

## 2018-02-01 ENCOUNTER — Ambulatory Visit (INDEPENDENT_AMBULATORY_CARE_PROVIDER_SITE_OTHER): Payer: Medicare Other | Admitting: Family Medicine

## 2018-02-01 ENCOUNTER — Ambulatory Visit: Payer: Medicare Other | Admitting: Nurse Practitioner

## 2018-02-01 VITALS — BP 161/83 | HR 93 | Temp 98.2°F | Wt 176.2 lb

## 2018-02-01 DIAGNOSIS — B029 Zoster without complications: Secondary | ICD-10-CM

## 2018-02-01 DIAGNOSIS — B372 Candidiasis of skin and nail: Secondary | ICD-10-CM

## 2018-02-01 MED ORDER — NYSTATIN 100000 UNIT/GM EX POWD: Freq: Four times a day (QID) | CUTANEOUS | 1 refills | Status: DC

## 2018-02-01 MED ORDER — VALACYCLOVIR HCL 1 G PO TABS: 1000.0000 mg | ORAL_TABLET | Freq: Two times a day (BID) | ORAL | 0 refills | Status: DC

## 2018-02-01 NOTE — Telephone Encounter (Signed)
Called and left patient's daughter a VM (signed DPR) letting her know what Malachy Mood said. Asked for her to please call back with any questions or concerns.

## 2018-02-01 NOTE — Progress Notes (Signed)
BP (!) 161/83 (BP Location: Right Arm, Patient Position: Sitting, Cuff Size: Normal)   Pulse 93   Temp 98.2 F (36.8 C)   Wt 176 lb 4 oz (79.9 kg)   LMP  (LMP Unknown)   SpO2 96%   BMI 34.42 kg/m    Subjective:    Patient ID: Barbara Blackburn, female    DOB: 15-Oct-1928, 84 y.o.   MRN: [id number]  HPI: Barbara Blackburn is a 84 y.o. female  Chief Complaint  Patient presents with  . Rash    under breast   RASH Duration: 1.5 weeks  Location: under L breast  Itching: no Burning: yes Redness: yes Oozing: no Scaling: yes Blisters: yes Painful: yes Fevers: no Change in detergents/soaps/personal care products: no Recent illness: no Recent travel:no History of same: yes Context: better Alleviating factors: nothing Treatments attempted:lotion/moisturizer Shortness of breath: no  Throat/tongue swelling: no Myalgias/arthralgias: no   Relevant past medical, surgical, family and social history reviewed and updated as indicated. Interim medical history since our last visit reviewed. Allergies and medications reviewed and updated.  Review of Systems  Constitutional: Negative.   HENT: Negative.   Respiratory: Negative.   Cardiovascular: Negative.   Skin: Positive for rash. Negative for color change, pallor and wound.  Psychiatric/Behavioral: Negative.     Per HPI unless specifically indicated above     Objective:    BP (!) 161/83 (BP Location: Right Arm, Patient Position: Sitting, Cuff Size: Normal)   Pulse 93   Temp 98.2 F (36.8 C)   Wt 176 lb 4 oz (79.9 kg)   LMP  (LMP Unknown)   SpO2 96%   BMI 34.42 kg/m   Wt Readings from Last 3 Encounters:  02/01/18 176 lb 4 oz (79.9 kg)  01/25/18 182 lb 6.4 oz (82.7 kg)  01/01/18 198 lb 6.6 oz (90 kg)    Physical Exam  Constitutional: She is oriented to person, place, and time. She appears well-developed and well-nourished. No distress.  HENT:  Head: Normocephalic and atraumatic.  Right Ear: Hearing normal.    Left Ear: Hearing normal.  Nose: Nose normal.  Eyes: Conjunctivae and lids are normal. Right eye exhibits no discharge. Left eye exhibits no discharge. No scleral icterus.  Cardiovascular: Normal rate, regular rhythm, normal heart sounds and intact distal pulses. Exam reveals no gallop and no friction rub.  No murmur heard. Pulmonary/Chest: Effort normal and breath sounds normal. No stridor. No respiratory distress. She has no wheezes. She has no rales. She exhibits no tenderness.  Musculoskeletal: Normal range of motion.  Neurological: She is alert and oriented to person, place, and time.  Skin: Skin is warm, dry and intact. Capillary refill takes less than 2 seconds. Rash (erythematous rash under L breast with vessicles) noted. She is not diaphoretic. No erythema. No pallor.  Psychiatric: She has a normal mood and affect. Her speech is normal and behavior is normal. Judgment and thought content normal. Cognition and memory are normal.  Nursing note and vitals reviewed.   Results for orders placed or performed during the hospital encounter of 01/01/18  CBC with Differential/Platelet  Result Value Ref Range   WBC 15.3 (H) 3.6 - 11.0 K/uL   RBC 4.92 3.80 - 5.20 MIL/uL   Hemoglobin 15.1 12.0 - 16.0 g/dL   HCT 44.9 35.0 - 47.0 %   MCV 91.2 80.0 - 100.0 fL   MCH 30.8 26.0 - 34.0 pg   MCHC 33.7 32.0 - 36.0 g/dL   RDW  14.3 11.5 - 14.5 %   Platelets 201 150 - 440 K/uL   Neutrophils Relative % 88 %   Neutro Abs 13.5 (H) 1.4 - 6.5 K/uL   Lymphocytes Relative 7 %   Lymphs Abs 1.0 1.0 - 3.6 K/uL   Monocytes Relative 5 %   Monocytes Absolute 0.7 0.2 - 0.9 K/uL   Eosinophils Relative 0 %   Eosinophils Absolute 0.0 0 - 0.7 K/uL   Basophils Relative 0 %   Basophils Absolute 0.1 0 - 0.1 K/uL  Comprehensive metabolic panel  Result Value Ref Range   Sodium 139 135 - 145 mmol/L   Potassium 3.6 3.5 - 5.1 mmol/L   Chloride 102 101 - 111 mmol/L   CO2 24 22 - 32 mmol/L   Glucose, Bld 146 (H) 65 -  99 mg/dL   BUN 22 (H) 6 - 20 mg/dL   Creatinine, Ser 0.72 0.44 - 1.00 mg/dL   Calcium 9.3 8.9 - 10.3 mg/dL   Total Protein 7.3 6.5 - 8.1 g/dL   Albumin 4.7 3.5 - 5.0 g/dL   AST 28 15 - 41 U/L   ALT 16 14 - 54 U/L   Alkaline Phosphatase 83 38 - 126 U/L   Total Bilirubin 1.1 0.3 - 1.2 mg/dL   GFR calc non Af Amer >60 >60 mL/min   GFR calc Af Amer >60 >60 mL/min   Anion gap 13 5 - 15      Assessment & Plan:   Problem List Items Addressed This Visit    None    Visit Diagnoses    Herpes zoster without complication    -  Primary   Will treat with valacyclovir. Call if not getting better or getting worse.    Relevant Medications   valACYclovir (VALTREX) 1000 MG tablet   nystatin (MYCOSTATIN/NYSTOP) powder   Candidal intertrigo       Seems to have super infection. Will treat with nystatin. Call with any concerns.    Relevant Medications   valACYclovir (VALTREX) 1000 MG tablet   nystatin (MYCOSTATIN/NYSTOP) powder       Follow up plan: Return if symptoms worsen or fail to improve.

## 2018-02-04 DIAGNOSIS — S065X9D Traumatic subdural hemorrhage with loss of consciousness of unspecified duration, subsequent encounter: Secondary | ICD-10-CM | POA: Diagnosis not present

## 2018-02-04 DIAGNOSIS — Z7901 Long term (current) use of anticoagulants: Secondary | ICD-10-CM | POA: Diagnosis not present

## 2018-02-04 DIAGNOSIS — E785 Hyperlipidemia, unspecified: Secondary | ICD-10-CM | POA: Diagnosis not present

## 2018-02-04 DIAGNOSIS — S81811D Laceration without foreign body, right lower leg, subsequent encounter: Secondary | ICD-10-CM | POA: Diagnosis not present

## 2018-02-04 DIAGNOSIS — I4891 Unspecified atrial fibrillation: Secondary | ICD-10-CM | POA: Diagnosis not present

## 2018-02-04 DIAGNOSIS — I110 Hypertensive heart disease with heart failure: Secondary | ICD-10-CM | POA: Diagnosis not present

## 2018-02-04 DIAGNOSIS — S0232XD Fracture of orbital floor, left side, subsequent encounter for fracture with routine healing: Secondary | ICD-10-CM | POA: Diagnosis not present

## 2018-02-04 DIAGNOSIS — I509 Heart failure, unspecified: Secondary | ICD-10-CM | POA: Diagnosis not present

## 2018-02-04 DIAGNOSIS — Z95810 Presence of automatic (implantable) cardiac defibrillator: Secondary | ICD-10-CM | POA: Diagnosis not present

## 2018-02-05 DIAGNOSIS — S0232XD Fracture of orbital floor, left side, subsequent encounter for fracture with routine healing: Secondary | ICD-10-CM | POA: Diagnosis not present

## 2018-02-05 DIAGNOSIS — Z95810 Presence of automatic (implantable) cardiac defibrillator: Secondary | ICD-10-CM | POA: Diagnosis not present

## 2018-02-05 DIAGNOSIS — S81811D Laceration without foreign body, right lower leg, subsequent encounter: Secondary | ICD-10-CM | POA: Diagnosis not present

## 2018-02-05 DIAGNOSIS — I110 Hypertensive heart disease with heart failure: Secondary | ICD-10-CM | POA: Diagnosis not present

## 2018-02-05 DIAGNOSIS — Z7901 Long term (current) use of anticoagulants: Secondary | ICD-10-CM | POA: Diagnosis not present

## 2018-02-05 DIAGNOSIS — I4891 Unspecified atrial fibrillation: Secondary | ICD-10-CM | POA: Diagnosis not present

## 2018-02-05 DIAGNOSIS — E785 Hyperlipidemia, unspecified: Secondary | ICD-10-CM | POA: Diagnosis not present

## 2018-02-05 DIAGNOSIS — I509 Heart failure, unspecified: Secondary | ICD-10-CM | POA: Diagnosis not present

## 2018-02-05 DIAGNOSIS — S065X9D Traumatic subdural hemorrhage with loss of consciousness of unspecified duration, subsequent encounter: Secondary | ICD-10-CM | POA: Diagnosis not present

## 2018-02-06 DIAGNOSIS — S81811D Laceration without foreign body, right lower leg, subsequent encounter: Secondary | ICD-10-CM | POA: Diagnosis not present

## 2018-02-06 DIAGNOSIS — S065X9D Traumatic subdural hemorrhage with loss of consciousness of unspecified duration, subsequent encounter: Secondary | ICD-10-CM | POA: Diagnosis not present

## 2018-02-06 DIAGNOSIS — Z7901 Long term (current) use of anticoagulants: Secondary | ICD-10-CM | POA: Diagnosis not present

## 2018-02-06 DIAGNOSIS — Z95810 Presence of automatic (implantable) cardiac defibrillator: Secondary | ICD-10-CM | POA: Diagnosis not present

## 2018-02-06 DIAGNOSIS — I4891 Unspecified atrial fibrillation: Secondary | ICD-10-CM | POA: Diagnosis not present

## 2018-02-06 DIAGNOSIS — S0232XD Fracture of orbital floor, left side, subsequent encounter for fracture with routine healing: Secondary | ICD-10-CM | POA: Diagnosis not present

## 2018-02-06 DIAGNOSIS — E785 Hyperlipidemia, unspecified: Secondary | ICD-10-CM | POA: Diagnosis not present

## 2018-02-06 DIAGNOSIS — I110 Hypertensive heart disease with heart failure: Secondary | ICD-10-CM | POA: Diagnosis not present

## 2018-02-06 DIAGNOSIS — I509 Heart failure, unspecified: Secondary | ICD-10-CM | POA: Diagnosis not present

## 2018-02-07 DIAGNOSIS — Z7901 Long term (current) use of anticoagulants: Secondary | ICD-10-CM | POA: Diagnosis not present

## 2018-02-07 DIAGNOSIS — I110 Hypertensive heart disease with heart failure: Secondary | ICD-10-CM | POA: Diagnosis not present

## 2018-02-07 DIAGNOSIS — I4891 Unspecified atrial fibrillation: Secondary | ICD-10-CM | POA: Diagnosis not present

## 2018-02-07 DIAGNOSIS — S065X9D Traumatic subdural hemorrhage with loss of consciousness of unspecified duration, subsequent encounter: Secondary | ICD-10-CM | POA: Diagnosis not present

## 2018-02-07 DIAGNOSIS — S0232XD Fracture of orbital floor, left side, subsequent encounter for fracture with routine healing: Secondary | ICD-10-CM | POA: Diagnosis not present

## 2018-02-07 DIAGNOSIS — Z95810 Presence of automatic (implantable) cardiac defibrillator: Secondary | ICD-10-CM | POA: Diagnosis not present

## 2018-02-07 DIAGNOSIS — S81811D Laceration without foreign body, right lower leg, subsequent encounter: Secondary | ICD-10-CM | POA: Diagnosis not present

## 2018-02-07 DIAGNOSIS — E785 Hyperlipidemia, unspecified: Secondary | ICD-10-CM | POA: Diagnosis not present

## 2018-02-07 DIAGNOSIS — I509 Heart failure, unspecified: Secondary | ICD-10-CM | POA: Diagnosis not present

## 2018-02-12 DIAGNOSIS — Z95810 Presence of automatic (implantable) cardiac defibrillator: Secondary | ICD-10-CM | POA: Diagnosis not present

## 2018-02-12 DIAGNOSIS — S0232XD Fracture of orbital floor, left side, subsequent encounter for fracture with routine healing: Secondary | ICD-10-CM | POA: Diagnosis not present

## 2018-02-12 DIAGNOSIS — I509 Heart failure, unspecified: Secondary | ICD-10-CM | POA: Diagnosis not present

## 2018-02-12 DIAGNOSIS — S065X9D Traumatic subdural hemorrhage with loss of consciousness of unspecified duration, subsequent encounter: Secondary | ICD-10-CM | POA: Diagnosis not present

## 2018-02-12 DIAGNOSIS — I110 Hypertensive heart disease with heart failure: Secondary | ICD-10-CM | POA: Diagnosis not present

## 2018-02-12 DIAGNOSIS — I4891 Unspecified atrial fibrillation: Secondary | ICD-10-CM | POA: Diagnosis not present

## 2018-02-12 DIAGNOSIS — E785 Hyperlipidemia, unspecified: Secondary | ICD-10-CM | POA: Diagnosis not present

## 2018-02-12 DIAGNOSIS — Z7901 Long term (current) use of anticoagulants: Secondary | ICD-10-CM | POA: Diagnosis not present

## 2018-02-12 DIAGNOSIS — S81811D Laceration without foreign body, right lower leg, subsequent encounter: Secondary | ICD-10-CM | POA: Diagnosis not present

## 2018-02-13 DIAGNOSIS — I4891 Unspecified atrial fibrillation: Secondary | ICD-10-CM | POA: Diagnosis not present

## 2018-02-13 DIAGNOSIS — S065X9D Traumatic subdural hemorrhage with loss of consciousness of unspecified duration, subsequent encounter: Secondary | ICD-10-CM | POA: Diagnosis not present

## 2018-02-13 DIAGNOSIS — I509 Heart failure, unspecified: Secondary | ICD-10-CM | POA: Diagnosis not present

## 2018-02-13 DIAGNOSIS — S81811D Laceration without foreign body, right lower leg, subsequent encounter: Secondary | ICD-10-CM | POA: Diagnosis not present

## 2018-02-13 DIAGNOSIS — E785 Hyperlipidemia, unspecified: Secondary | ICD-10-CM | POA: Diagnosis not present

## 2018-02-13 DIAGNOSIS — Z7901 Long term (current) use of anticoagulants: Secondary | ICD-10-CM | POA: Diagnosis not present

## 2018-02-13 DIAGNOSIS — Z95810 Presence of automatic (implantable) cardiac defibrillator: Secondary | ICD-10-CM | POA: Diagnosis not present

## 2018-02-13 DIAGNOSIS — S0232XD Fracture of orbital floor, left side, subsequent encounter for fracture with routine healing: Secondary | ICD-10-CM | POA: Diagnosis not present

## 2018-02-13 DIAGNOSIS — I110 Hypertensive heart disease with heart failure: Secondary | ICD-10-CM | POA: Diagnosis not present

## 2018-02-14 DIAGNOSIS — I509 Heart failure, unspecified: Secondary | ICD-10-CM | POA: Diagnosis not present

## 2018-02-14 DIAGNOSIS — S0232XD Fracture of orbital floor, left side, subsequent encounter for fracture with routine healing: Secondary | ICD-10-CM | POA: Diagnosis not present

## 2018-02-14 DIAGNOSIS — Z7901 Long term (current) use of anticoagulants: Secondary | ICD-10-CM | POA: Diagnosis not present

## 2018-02-14 DIAGNOSIS — S81811D Laceration without foreign body, right lower leg, subsequent encounter: Secondary | ICD-10-CM | POA: Diagnosis not present

## 2018-02-14 DIAGNOSIS — I110 Hypertensive heart disease with heart failure: Secondary | ICD-10-CM | POA: Diagnosis not present

## 2018-02-14 DIAGNOSIS — E785 Hyperlipidemia, unspecified: Secondary | ICD-10-CM | POA: Diagnosis not present

## 2018-02-14 DIAGNOSIS — Z95810 Presence of automatic (implantable) cardiac defibrillator: Secondary | ICD-10-CM | POA: Diagnosis not present

## 2018-02-14 DIAGNOSIS — S065X9D Traumatic subdural hemorrhage with loss of consciousness of unspecified duration, subsequent encounter: Secondary | ICD-10-CM | POA: Diagnosis not present

## 2018-02-14 DIAGNOSIS — I4891 Unspecified atrial fibrillation: Secondary | ICD-10-CM | POA: Diagnosis not present

## 2018-02-15 DIAGNOSIS — I110 Hypertensive heart disease with heart failure: Secondary | ICD-10-CM | POA: Diagnosis not present

## 2018-02-15 DIAGNOSIS — I4891 Unspecified atrial fibrillation: Secondary | ICD-10-CM | POA: Diagnosis not present

## 2018-02-15 DIAGNOSIS — S81811D Laceration without foreign body, right lower leg, subsequent encounter: Secondary | ICD-10-CM | POA: Diagnosis not present

## 2018-02-15 DIAGNOSIS — Z7901 Long term (current) use of anticoagulants: Secondary | ICD-10-CM | POA: Diagnosis not present

## 2018-02-15 DIAGNOSIS — E785 Hyperlipidemia, unspecified: Secondary | ICD-10-CM | POA: Diagnosis not present

## 2018-02-15 DIAGNOSIS — Z95810 Presence of automatic (implantable) cardiac defibrillator: Secondary | ICD-10-CM | POA: Diagnosis not present

## 2018-02-15 DIAGNOSIS — I509 Heart failure, unspecified: Secondary | ICD-10-CM | POA: Diagnosis not present

## 2018-02-15 DIAGNOSIS — S0232XD Fracture of orbital floor, left side, subsequent encounter for fracture with routine healing: Secondary | ICD-10-CM | POA: Diagnosis not present

## 2018-02-15 DIAGNOSIS — S065X9D Traumatic subdural hemorrhage with loss of consciousness of unspecified duration, subsequent encounter: Secondary | ICD-10-CM | POA: Diagnosis not present

## 2018-02-18 DIAGNOSIS — S0232XD Fracture of orbital floor, left side, subsequent encounter for fracture with routine healing: Secondary | ICD-10-CM | POA: Diagnosis not present

## 2018-02-18 DIAGNOSIS — Z95810 Presence of automatic (implantable) cardiac defibrillator: Secondary | ICD-10-CM | POA: Diagnosis not present

## 2018-02-18 DIAGNOSIS — Z7901 Long term (current) use of anticoagulants: Secondary | ICD-10-CM | POA: Diagnosis not present

## 2018-02-18 DIAGNOSIS — S065X9D Traumatic subdural hemorrhage with loss of consciousness of unspecified duration, subsequent encounter: Secondary | ICD-10-CM | POA: Diagnosis not present

## 2018-02-18 DIAGNOSIS — I4891 Unspecified atrial fibrillation: Secondary | ICD-10-CM | POA: Diagnosis not present

## 2018-02-18 DIAGNOSIS — I509 Heart failure, unspecified: Secondary | ICD-10-CM | POA: Diagnosis not present

## 2018-02-18 DIAGNOSIS — E785 Hyperlipidemia, unspecified: Secondary | ICD-10-CM | POA: Diagnosis not present

## 2018-02-18 DIAGNOSIS — I110 Hypertensive heart disease with heart failure: Secondary | ICD-10-CM | POA: Diagnosis not present

## 2018-02-18 DIAGNOSIS — S81811D Laceration without foreign body, right lower leg, subsequent encounter: Secondary | ICD-10-CM | POA: Diagnosis not present

## 2018-02-19 ENCOUNTER — Ambulatory Visit: Payer: Medicare Other | Admitting: Internal Medicine

## 2018-02-19 ENCOUNTER — Encounter: Payer: Self-pay | Admitting: Internal Medicine

## 2018-02-19 VITALS — BP 110/78 | HR 75 | Ht 61.0 in | Wt 173.2 lb

## 2018-02-19 DIAGNOSIS — I481 Persistent atrial fibrillation: Secondary | ICD-10-CM

## 2018-02-19 DIAGNOSIS — I4819 Other persistent atrial fibrillation: Secondary | ICD-10-CM

## 2018-02-19 DIAGNOSIS — I428 Other cardiomyopathies: Secondary | ICD-10-CM | POA: Diagnosis not present

## 2018-02-19 DIAGNOSIS — Z95810 Presence of automatic (implantable) cardiac defibrillator: Secondary | ICD-10-CM | POA: Diagnosis not present

## 2018-02-19 DIAGNOSIS — I5022 Chronic systolic (congestive) heart failure: Secondary | ICD-10-CM | POA: Diagnosis not present

## 2018-02-19 MED ORDER — AMLODIPINE BESYLATE 2.5 MG PO TABS: 2.5000 mg | ORAL_TABLET | Freq: Every day | ORAL | 3 refills | Status: DC

## 2018-02-19 NOTE — Patient Instructions (Signed)
Medication Instructions: - Your physician has recommended you make the following change in your medication:   1) DECREASE norvasc (amlodipine) to 2.5 mg- take 1 tablet by mouth once daily  Labwork: - none ordered  Procedures/Testing: - none ordered  Follow-Up: - Remote monitoring is used to monitor your Pacemaker of ICD from home. This monitoring reduces the number of office visits required to check your device to one time per year. It allows [place] to keep an eye on the functioning of your device to ensure it is working properly. You are scheduled for a device check from home on 03/19/18. You may send your transmission at any time that day. If you have a wireless device, the transmission will be sent automatically. After your physician reviews your transmission, you will receive a postcard with your next transmission date.  - Your physician wants you to follow-up in: 1 year with Dr. Caryl Comes. You will receive a reminder letter in the mail two months in advance. If you don't receive a letter, please call our office to schedule the follow-up appointment.   Any Additional Special Instructions Will Be Listed Below (If Applicable).     If you need a refill on your cardiac medications before your next appointment, please call your pharmacy.

## 2018-02-19 NOTE — Progress Notes (Signed)
Patient Care Team: Kathrine Haddock, NP as PCP - General (Nurse Practitioner) Wende Bushy, MD as Consulting Physician (Cardiology) Winfield Rast, [place] as Referring Physician (Chiropractic Medicine) Deboraha Sprang, MD as Consulting Physician (Cardiology)   HPI  Barbara Blackburn is a 84 y.o. female Seen in followup for a dual chamber ICD implanted for syncope in the setting of  Nonischemic and  ischemic cardiomyopathy with prior STENTING  and congestive heart failure. She had  received a defibrillator with a 6949-lead.  At  device generator replacement in 2012; she underwent "pirating" of her rate sense pacing lead and capping of the rate sense portion of her ICD lead  She reached ERI and underwent generator replacement 4/18.  She has fallen recently,  This has been a balance issue  And has orthostatic lightheadedness  BP at home 110s  No chest pain or sob    Myoview per [facility]) notes 2/14>>EF 50% w inferior scar  DATE TEST    2012        EF 20.25 %   2/14    Myoview   EF 50 %   4/17 Echo EF 60-65%            She has hx of Atrial fibrillation now permanent, with prior stroke>> now on Rivaroxaban   Date Cr K GFR Hgb  7/18 0.81  60s 15.2  5/19  0.72 3.6  93.2   Thromboembolic risk factors ( age  -2, HTN-1, TIA/CVA-2, Vasc disease -1, Gender-1) for a CHADSVASc Score of 7    Past Medical History:  Diagnosis Date  . 6949-lead 11/11/2013  . Allergy   . CHF (congestive heart failure) (HCC)    class 2  . Chronic atrial fibrillation ([place])   . Coronary artery disease   . Degenerative joint disease    severe  . Dyslipidemia   . Hyperlipidemia   . ICD-Medtronic 05/10/2009   Qualifier: Diagnosis of  By: Lovena Le, MD, [facility], Binnie Kand   . Nonischemic cardiomyopathy [facility])     Past Surgical History:  Procedure Laterality Date  . ICD GENERATOR CHANGEOUT N/A 12/06/2016   Procedure: ICD Generator Changeout;  Surgeon: Deboraha Sprang, MD;  Location: [facility];   Service: Cardiovascular;  Laterality: N/A;  . Medtronic dual-chamber ICD  01/05/2006   generator change April 2012  . PCI stent    . REPLACEMENT TOTAL KNEE      Current Outpatient Medications  Medication Sig Dispense Refill  . amLODipine (NORVASC) 5 MG tablet TAKE 1 TABLET BY MOUTH  DAILY 90 tablet 3  . Cholecalciferol (VITAMIN D) 2000 units tablet Take 2,000 Units by mouth daily.    . diclofenac sodium (VOLTAREN) 1 % GEL Apply topically.    . ENSURE PLUS (ENSURE PLUS) LIQD Take 237 mLs by mouth.    . furosemide (LASIX) 40 MG tablet TAKE 1 TABLET BY MOUTH  DAILY WITH LUNCH 90 tablet 3  . losartan (COZAAR) 50 MG tablet TAKE 1 TABLET BY MOUTH  DAILY 90 tablet 3  . Probiotic Product (ALIGN) 4 MG CAPS Take 4 mg by mouth daily.     . rivaroxaban (XARELTO) 20 MG TABS tablet Take 1 tablet (20 mg total) by mouth daily with supper. 90 tablet 3  . rosuvastatin (CRESTOR) 10 MG tablet TAKE 1 TABLET BY MOUTH  DAILY 90 tablet 1  . sertraline (ZOLOFT) 25 MG tablet Take 25 mg by mouth daily.     No current  facility-administered medications for this visit.     Allergies  Allergen Reactions  . Clonazepam     Nightmare   . Fluoxetine Hcl Other (See Comments)    nightmares  . Lisinopril     Cough   . Zolpidem Tartrate Other (See Comments)    Nightmares     Review of Systems negative except from HPI and PMH  Physical Exam BP 110/78 (BP Location: Left Arm, Patient Position: Sitting, Cuff Size: Normal)   Pulse 75   Ht 5\' 1"  (1.549 m)   Wt 173 lb 4 oz (78.6 kg)   LMP  (LMP Unknown)   BMI 32.74 kg/m  Well developed and nourished in no acute distress HENT normal Neck supple with JVP-flat Clear Regular rate and rhythm, no murmurs or gallops Abd-soft with active BS No Clubbing cyanosis edema Skin-warm and dry A & Oriented  Grossly normal sensory and motor function    ECG demonstrates Afib with occ V pacing  Assessment and  Plan  Nonischemic cardiomyopathy  Atrial fibrillation  permanent  Congestive heart failure-chronic systolic  Syncope  Hypertension   Falls  ICD for secondary prevention  The patient's device was interrogated.  The information was reviewed. No changes were made in the programming.     6949-lead with capping of the rate sense portion    On Anticoagulation;  No bleeding issues   Euvolemic continue current meds  With falls and orthostatic lightheadedness and BP in 110 range will decrease amlodipine 5>>2.5  If BP remains below 130's after about 2 weeks would decrease losartan 50>>25  There may be a role for an abd binder   We spent more than 50% of our >25 min visit in face to face counseling regarding the above

## 2018-02-20 DIAGNOSIS — S81811D Laceration without foreign body, right lower leg, subsequent encounter: Secondary | ICD-10-CM | POA: Diagnosis not present

## 2018-02-20 DIAGNOSIS — S0232XD Fracture of orbital floor, left side, subsequent encounter for fracture with routine healing: Secondary | ICD-10-CM | POA: Diagnosis not present

## 2018-02-20 DIAGNOSIS — S065X9D Traumatic subdural hemorrhage with loss of consciousness of unspecified duration, subsequent encounter: Secondary | ICD-10-CM | POA: Diagnosis not present

## 2018-02-20 DIAGNOSIS — I110 Hypertensive heart disease with heart failure: Secondary | ICD-10-CM | POA: Diagnosis not present

## 2018-02-20 DIAGNOSIS — I509 Heart failure, unspecified: Secondary | ICD-10-CM | POA: Diagnosis not present

## 2018-02-20 DIAGNOSIS — Z95810 Presence of automatic (implantable) cardiac defibrillator: Secondary | ICD-10-CM | POA: Diagnosis not present

## 2018-02-20 DIAGNOSIS — E785 Hyperlipidemia, unspecified: Secondary | ICD-10-CM | POA: Diagnosis not present

## 2018-02-20 DIAGNOSIS — Z7901 Long term (current) use of anticoagulants: Secondary | ICD-10-CM | POA: Diagnosis not present

## 2018-02-20 DIAGNOSIS — I4891 Unspecified atrial fibrillation: Secondary | ICD-10-CM | POA: Diagnosis not present

## 2018-02-22 DIAGNOSIS — I4891 Unspecified atrial fibrillation: Secondary | ICD-10-CM | POA: Diagnosis not present

## 2018-02-22 DIAGNOSIS — Z95810 Presence of automatic (implantable) cardiac defibrillator: Secondary | ICD-10-CM | POA: Diagnosis not present

## 2018-02-22 DIAGNOSIS — I110 Hypertensive heart disease with heart failure: Secondary | ICD-10-CM | POA: Diagnosis not present

## 2018-02-22 DIAGNOSIS — E785 Hyperlipidemia, unspecified: Secondary | ICD-10-CM | POA: Diagnosis not present

## 2018-02-22 DIAGNOSIS — I509 Heart failure, unspecified: Secondary | ICD-10-CM | POA: Diagnosis not present

## 2018-02-22 DIAGNOSIS — S065X9D Traumatic subdural hemorrhage with loss of consciousness of unspecified duration, subsequent encounter: Secondary | ICD-10-CM | POA: Diagnosis not present

## 2018-02-22 DIAGNOSIS — Z7901 Long term (current) use of anticoagulants: Secondary | ICD-10-CM | POA: Diagnosis not present

## 2018-02-22 DIAGNOSIS — S0232XD Fracture of orbital floor, left side, subsequent encounter for fracture with routine healing: Secondary | ICD-10-CM | POA: Diagnosis not present

## 2018-02-22 DIAGNOSIS — S81811D Laceration without foreign body, right lower leg, subsequent encounter: Secondary | ICD-10-CM | POA: Diagnosis not present

## 2018-02-25 DIAGNOSIS — S0232XD Fracture of orbital floor, left side, subsequent encounter for fracture with routine healing: Secondary | ICD-10-CM | POA: Diagnosis not present

## 2018-02-25 DIAGNOSIS — I509 Heart failure, unspecified: Secondary | ICD-10-CM | POA: Diagnosis not present

## 2018-02-25 DIAGNOSIS — S81811D Laceration without foreign body, right lower leg, subsequent encounter: Secondary | ICD-10-CM | POA: Diagnosis not present

## 2018-02-25 DIAGNOSIS — Z95810 Presence of automatic (implantable) cardiac defibrillator: Secondary | ICD-10-CM | POA: Diagnosis not present

## 2018-02-25 DIAGNOSIS — I110 Hypertensive heart disease with heart failure: Secondary | ICD-10-CM | POA: Diagnosis not present

## 2018-02-25 DIAGNOSIS — I4891 Unspecified atrial fibrillation: Secondary | ICD-10-CM | POA: Diagnosis not present

## 2018-02-25 DIAGNOSIS — E785 Hyperlipidemia, unspecified: Secondary | ICD-10-CM | POA: Diagnosis not present

## 2018-02-25 DIAGNOSIS — Z7901 Long term (current) use of anticoagulants: Secondary | ICD-10-CM | POA: Diagnosis not present

## 2018-02-25 DIAGNOSIS — S065X9D Traumatic subdural hemorrhage with loss of consciousness of unspecified duration, subsequent encounter: Secondary | ICD-10-CM | POA: Diagnosis not present

## 2018-02-26 ENCOUNTER — Ambulatory Visit (INDEPENDENT_AMBULATORY_CARE_PROVIDER_SITE_OTHER): Payer: Medicare Other | Admitting: Unknown Physician Specialty

## 2018-02-26 ENCOUNTER — Encounter: Payer: Self-pay | Admitting: Unknown Physician Specialty

## 2018-02-26 DIAGNOSIS — F320 Major depressive disorder, single episode, mild: Secondary | ICD-10-CM | POA: Diagnosis not present

## 2018-02-26 DIAGNOSIS — I10 Essential (primary) hypertension: Secondary | ICD-10-CM | POA: Diagnosis not present

## 2018-02-26 DIAGNOSIS — M67912 Unspecified disorder of synovium and tendon, left shoulder: Secondary | ICD-10-CM

## 2018-02-26 MED ORDER — SERTRALINE HCL 50 MG PO TABS: 50.0000 mg | ORAL_TABLET | Freq: Every day | ORAL | 1 refills | Status: DC

## 2018-02-26 NOTE — Assessment & Plan Note (Signed)
Shoulder injection 1 month ago.  Will work with PT and continue with Voltaren gel

## 2018-02-26 NOTE — Assessment & Plan Note (Signed)
Not to goal.  Will increase Sertraline to 50 mg.

## 2018-02-26 NOTE — Assessment & Plan Note (Addendum)
SBP 135 on recheck. This is stable.  Will continue with present treatment

## 2018-02-26 NOTE — Progress Notes (Signed)
BP (!) 162/92   Pulse 76   Temp 98.7 F (37.1 C) (Oral)   Ht 5\' 1"  (1.549 m)   Wt 175 lb 6.4 oz (79.6 kg)   LMP  (LMP Unknown)   SpO2 97%   BMI 33.14 kg/m    Subjective:    Patient ID: Barbara Blackburn, female    DOB: May 29, 1929, 84 y.o.   MRN: [id number]  HPI: Barbara Blackburn is a 84 y.o. female  Chief Complaint  Patient presents with  . Follow-up   Pt states "I'm a whole lot better than I was last time."  Now discharged from [facility] but getting PT till the end of July.    Left arm pain - Pt with persistent left arm pain.  Steroid injection of left shoulder given about 1 month ago.  Concern was overdoing it at OT and Rehab.    Hypertension Using medications without difficulty.  Was told to keep BP high due to orthostatic changes.  Average home BPs 122/60 yesterday   No problems or lightheadedness No chest pain with exertion or shortness of breath No Edema  Pt states she has had some depression thinking about what she used to do.  Taking Sertraline 25 mg/day Depression screen [facility] 2/9 02/26/2018 10/05/2017 09/24/2017 09/03/2017 08/02/2017  Decreased Interest 2 1 1  0 0  Down, Depressed, Hopeless 2 1 1  0 0  PHQ - 2 Score 4 2 2  0 0  Altered sleeping 0 1 1 - -  Tired, decreased energy 2 1 1  - -  Change in appetite 0 0 0 - -  Feeling bad or failure about yourself  1 1 1  - -  Trouble concentrating 0 0 0 - -  Moving slowly or fidgety/restless 0 0 0 - -  Suicidal thoughts 0 0 0 - -  PHQ-9 Score 7 5 5  - -  Difficult doing work/chores - Somewhat difficult - - -  Some recent data might be hidden     Relevant past medical, surgical, family and social history reviewed and updated as indicated. Interim medical history since our last visit reviewed. Allergies and medications reviewed and updated.  Review of Systems  Per HPI unless specifically indicated above     Objective:    BP (!) 162/92   Pulse 76   Temp 98.7 F (37.1 C) (Oral)   Ht 5\' 1"  (1.549 m)   Wt 175  lb 6.4 oz (79.6 kg)   LMP  (LMP Unknown)   SpO2 97%   BMI 33.14 kg/m   Wt Readings from Last 3 Encounters:  02/26/18 175 lb 6.4 oz (79.6 kg)  02/19/18 173 lb 4 oz (78.6 kg)  02/01/18 176 lb 4 oz (79.9 kg)    Physical Exam  Constitutional: She is oriented to person, place, and time. She appears well-developed and well-nourished. No distress.  HENT:  Head: Normocephalic and atraumatic.  Eyes: Conjunctivae and lids are normal. Right eye exhibits no discharge. Left eye exhibits no discharge. No scleral icterus.  Neck: Normal range of motion. Neck supple. No JVD present. Carotid bruit is not present.  Cardiovascular: Normal rate, regular rhythm and normal heart sounds.  Pulmonary/Chest: Effort normal and breath sounds normal.  Abdominal: Normal appearance. There is no splenomegaly or hepatomegaly.  Musculoskeletal: Normal range of motion.  Neurological: She is alert and oriented to person, place, and time.  Skin: Skin is warm, dry and intact. No rash noted. No pallor.  Psychiatric: She has a normal  mood and affect. Her behavior is normal. Judgment and thought content normal.    Results for orders placed or performed during the hospital encounter of 01/01/18  CBC with Differential/Platelet  Result Value Ref Range   WBC 15.3 (H) 3.6 - 11.0 K/uL   RBC 4.92 3.80 - 5.20 MIL/uL   Hemoglobin 15.1 12.0 - 16.0 g/dL   HCT 44.9 35.0 - 47.0 %   MCV 91.2 80.0 - 100.0 fL   MCH 30.8 26.0 - 34.0 pg   MCHC 33.7 32.0 - 36.0 g/dL   RDW 14.3 11.5 - 14.5 %   Platelets 201 150 - 440 K/uL   Neutrophils Relative % 88 %   Neutro Abs 13.5 (H) 1.4 - 6.5 K/uL   Lymphocytes Relative 7 %   Lymphs Abs 1.0 1.0 - 3.6 K/uL   Monocytes Relative 5 %   Monocytes Absolute 0.7 0.2 - 0.9 K/uL   Eosinophils Relative 0 %   Eosinophils Absolute 0.0 0 - 0.7 K/uL   Basophils Relative 0 %   Basophils Absolute 0.1 0 - 0.1 K/uL  Comprehensive metabolic panel  Result Value Ref Range   Sodium 139 135 - 145 mmol/L    Potassium 3.6 3.5 - 5.1 mmol/L   Chloride 102 101 - 111 mmol/L   CO2 24 22 - 32 mmol/L   Glucose, Bld 146 (H) 65 - 99 mg/dL   BUN 22 (H) 6 - 20 mg/dL   Creatinine, Ser 0.72 0.44 - 1.00 mg/dL   Calcium 9.3 8.9 - 10.3 mg/dL   Total Protein 7.3 6.5 - 8.1 g/dL   Albumin 4.7 3.5 - 5.0 g/dL   AST 28 15 - 41 U/L   ALT 16 14 - 54 U/L   Alkaline Phosphatase 83 38 - 126 U/L   Total Bilirubin 1.1 0.3 - 1.2 mg/dL   GFR calc non Af Amer >60 >60 mL/min   GFR calc Af Amer >60 >60 mL/min   Anion gap 13 5 - 15      Assessment & Plan:   Problem List Items Addressed This Visit      Unprioritized   Depression, major, single episode, mild (HCC)    Not to goal.  Will increase Sertraline to 50 mg.        Relevant Medications   sertraline (ZOLOFT) 50 MG tablet   Disorder of left rotator cuff    Shoulder injection 1 month ago.  Will work with PT and continue with Voltaren gel      Essential hypertension, benign    SBP 135 on recheck. This is stable.  Will continue with present treatment          Follow up plan: Return in about 3 months (around 05/29/2018).

## 2018-02-27 DIAGNOSIS — I4891 Unspecified atrial fibrillation: Secondary | ICD-10-CM | POA: Diagnosis not present

## 2018-02-27 DIAGNOSIS — E785 Hyperlipidemia, unspecified: Secondary | ICD-10-CM | POA: Diagnosis not present

## 2018-02-27 DIAGNOSIS — I509 Heart failure, unspecified: Secondary | ICD-10-CM | POA: Diagnosis not present

## 2018-02-27 DIAGNOSIS — I110 Hypertensive heart disease with heart failure: Secondary | ICD-10-CM | POA: Diagnosis not present

## 2018-02-27 DIAGNOSIS — Z95810 Presence of automatic (implantable) cardiac defibrillator: Secondary | ICD-10-CM | POA: Diagnosis not present

## 2018-02-27 DIAGNOSIS — S0232XD Fracture of orbital floor, left side, subsequent encounter for fracture with routine healing: Secondary | ICD-10-CM | POA: Diagnosis not present

## 2018-02-27 DIAGNOSIS — S81811D Laceration without foreign body, right lower leg, subsequent encounter: Secondary | ICD-10-CM | POA: Diagnosis not present

## 2018-02-27 DIAGNOSIS — S065X9D Traumatic subdural hemorrhage with loss of consciousness of unspecified duration, subsequent encounter: Secondary | ICD-10-CM | POA: Diagnosis not present

## 2018-02-27 DIAGNOSIS — Z7901 Long term (current) use of anticoagulants: Secondary | ICD-10-CM | POA: Diagnosis not present

## 2018-03-04 DIAGNOSIS — S065X9D Traumatic subdural hemorrhage with loss of consciousness of unspecified duration, subsequent encounter: Secondary | ICD-10-CM | POA: Diagnosis not present

## 2018-03-04 DIAGNOSIS — S0232XD Fracture of orbital floor, left side, subsequent encounter for fracture with routine healing: Secondary | ICD-10-CM | POA: Diagnosis not present

## 2018-03-04 DIAGNOSIS — I110 Hypertensive heart disease with heart failure: Secondary | ICD-10-CM | POA: Diagnosis not present

## 2018-03-04 DIAGNOSIS — Z7901 Long term (current) use of anticoagulants: Secondary | ICD-10-CM | POA: Diagnosis not present

## 2018-03-04 DIAGNOSIS — E785 Hyperlipidemia, unspecified: Secondary | ICD-10-CM | POA: Diagnosis not present

## 2018-03-04 DIAGNOSIS — Z95810 Presence of automatic (implantable) cardiac defibrillator: Secondary | ICD-10-CM | POA: Diagnosis not present

## 2018-03-04 DIAGNOSIS — I4891 Unspecified atrial fibrillation: Secondary | ICD-10-CM | POA: Diagnosis not present

## 2018-03-04 DIAGNOSIS — I509 Heart failure, unspecified: Secondary | ICD-10-CM | POA: Diagnosis not present

## 2018-03-04 DIAGNOSIS — S81811D Laceration without foreign body, right lower leg, subsequent encounter: Secondary | ICD-10-CM | POA: Diagnosis not present

## 2018-03-06 DIAGNOSIS — Z95810 Presence of automatic (implantable) cardiac defibrillator: Secondary | ICD-10-CM | POA: Diagnosis not present

## 2018-03-06 DIAGNOSIS — I509 Heart failure, unspecified: Secondary | ICD-10-CM | POA: Diagnosis not present

## 2018-03-06 DIAGNOSIS — S0232XD Fracture of orbital floor, left side, subsequent encounter for fracture with routine healing: Secondary | ICD-10-CM | POA: Diagnosis not present

## 2018-03-06 DIAGNOSIS — I4891 Unspecified atrial fibrillation: Secondary | ICD-10-CM | POA: Diagnosis not present

## 2018-03-06 DIAGNOSIS — I110 Hypertensive heart disease with heart failure: Secondary | ICD-10-CM | POA: Diagnosis not present

## 2018-03-06 DIAGNOSIS — S81811D Laceration without foreign body, right lower leg, subsequent encounter: Secondary | ICD-10-CM | POA: Diagnosis not present

## 2018-03-06 DIAGNOSIS — E785 Hyperlipidemia, unspecified: Secondary | ICD-10-CM | POA: Diagnosis not present

## 2018-03-06 DIAGNOSIS — Z7901 Long term (current) use of anticoagulants: Secondary | ICD-10-CM | POA: Diagnosis not present

## 2018-03-06 DIAGNOSIS — S065X9D Traumatic subdural hemorrhage with loss of consciousness of unspecified duration, subsequent encounter: Secondary | ICD-10-CM | POA: Diagnosis not present

## 2018-03-11 DIAGNOSIS — S065X9D Traumatic subdural hemorrhage with loss of consciousness of unspecified duration, subsequent encounter: Secondary | ICD-10-CM | POA: Diagnosis not present

## 2018-03-11 DIAGNOSIS — I509 Heart failure, unspecified: Secondary | ICD-10-CM | POA: Diagnosis not present

## 2018-03-11 DIAGNOSIS — E785 Hyperlipidemia, unspecified: Secondary | ICD-10-CM | POA: Diagnosis not present

## 2018-03-11 DIAGNOSIS — I110 Hypertensive heart disease with heart failure: Secondary | ICD-10-CM | POA: Diagnosis not present

## 2018-03-11 DIAGNOSIS — Z95810 Presence of automatic (implantable) cardiac defibrillator: Secondary | ICD-10-CM | POA: Diagnosis not present

## 2018-03-11 DIAGNOSIS — S0232XD Fracture of orbital floor, left side, subsequent encounter for fracture with routine healing: Secondary | ICD-10-CM | POA: Diagnosis not present

## 2018-03-11 DIAGNOSIS — Z7901 Long term (current) use of anticoagulants: Secondary | ICD-10-CM | POA: Diagnosis not present

## 2018-03-11 DIAGNOSIS — I4891 Unspecified atrial fibrillation: Secondary | ICD-10-CM | POA: Diagnosis not present

## 2018-03-11 DIAGNOSIS — S81811D Laceration without foreign body, right lower leg, subsequent encounter: Secondary | ICD-10-CM | POA: Diagnosis not present

## 2018-03-13 DIAGNOSIS — I509 Heart failure, unspecified: Secondary | ICD-10-CM | POA: Diagnosis not present

## 2018-03-13 DIAGNOSIS — S81811D Laceration without foreign body, right lower leg, subsequent encounter: Secondary | ICD-10-CM | POA: Diagnosis not present

## 2018-03-13 DIAGNOSIS — Z7901 Long term (current) use of anticoagulants: Secondary | ICD-10-CM | POA: Diagnosis not present

## 2018-03-13 DIAGNOSIS — E785 Hyperlipidemia, unspecified: Secondary | ICD-10-CM | POA: Diagnosis not present

## 2018-03-13 DIAGNOSIS — S0232XD Fracture of orbital floor, left side, subsequent encounter for fracture with routine healing: Secondary | ICD-10-CM | POA: Diagnosis not present

## 2018-03-13 DIAGNOSIS — S065X9D Traumatic subdural hemorrhage with loss of consciousness of unspecified duration, subsequent encounter: Secondary | ICD-10-CM | POA: Diagnosis not present

## 2018-03-13 DIAGNOSIS — Z95810 Presence of automatic (implantable) cardiac defibrillator: Secondary | ICD-10-CM | POA: Diagnosis not present

## 2018-03-13 DIAGNOSIS — I110 Hypertensive heart disease with heart failure: Secondary | ICD-10-CM | POA: Diagnosis not present

## 2018-03-13 DIAGNOSIS — I4891 Unspecified atrial fibrillation: Secondary | ICD-10-CM | POA: Diagnosis not present

## 2018-03-18 DIAGNOSIS — S0232XD Fracture of orbital floor, left side, subsequent encounter for fracture with routine healing: Secondary | ICD-10-CM | POA: Diagnosis not present

## 2018-03-18 DIAGNOSIS — E785 Hyperlipidemia, unspecified: Secondary | ICD-10-CM | POA: Diagnosis not present

## 2018-03-18 DIAGNOSIS — S065X9D Traumatic subdural hemorrhage with loss of consciousness of unspecified duration, subsequent encounter: Secondary | ICD-10-CM | POA: Diagnosis not present

## 2018-03-18 DIAGNOSIS — Z7901 Long term (current) use of anticoagulants: Secondary | ICD-10-CM | POA: Diagnosis not present

## 2018-03-18 DIAGNOSIS — I110 Hypertensive heart disease with heart failure: Secondary | ICD-10-CM | POA: Diagnosis not present

## 2018-03-18 DIAGNOSIS — I4891 Unspecified atrial fibrillation: Secondary | ICD-10-CM | POA: Diagnosis not present

## 2018-03-18 DIAGNOSIS — I509 Heart failure, unspecified: Secondary | ICD-10-CM | POA: Diagnosis not present

## 2018-03-18 DIAGNOSIS — S81811D Laceration without foreign body, right lower leg, subsequent encounter: Secondary | ICD-10-CM | POA: Diagnosis not present

## 2018-03-18 DIAGNOSIS — Z95810 Presence of automatic (implantable) cardiac defibrillator: Secondary | ICD-10-CM | POA: Diagnosis not present

## 2018-03-19 ENCOUNTER — Ambulatory Visit (INDEPENDENT_AMBULATORY_CARE_PROVIDER_SITE_OTHER): Payer: Medicare Other | Admitting: *Deleted

## 2018-03-19 ENCOUNTER — Encounter: Payer: Medicare Other | Admitting: Internal Medicine

## 2018-03-19 DIAGNOSIS — I5022 Chronic systolic (congestive) heart failure: Secondary | ICD-10-CM | POA: Diagnosis not present

## 2018-03-19 DIAGNOSIS — I4891 Unspecified atrial fibrillation: Secondary | ICD-10-CM

## 2018-03-19 NOTE — Progress Notes (Signed)
Remote ICD transmission.   

## 2018-03-20 ENCOUNTER — Ambulatory Visit (INDEPENDENT_AMBULATORY_CARE_PROVIDER_SITE_OTHER): Payer: Medicare Other

## 2018-03-20 VITALS — BP 118/70 | HR 81 | Temp 97.9°F | Resp 16 | Ht 60.0 in | Wt 175.0 lb

## 2018-03-20 DIAGNOSIS — Z0000 Encounter for general adult medical examination without abnormal findings: Secondary | ICD-10-CM | POA: Diagnosis not present

## 2018-03-20 DIAGNOSIS — I4891 Unspecified atrial fibrillation: Secondary | ICD-10-CM | POA: Diagnosis not present

## 2018-03-20 DIAGNOSIS — S0232XD Fracture of orbital floor, left side, subsequent encounter for fracture with routine healing: Secondary | ICD-10-CM | POA: Diagnosis not present

## 2018-03-20 DIAGNOSIS — Z7901 Long term (current) use of anticoagulants: Secondary | ICD-10-CM | POA: Diagnosis not present

## 2018-03-20 DIAGNOSIS — I110 Hypertensive heart disease with heart failure: Secondary | ICD-10-CM | POA: Diagnosis not present

## 2018-03-20 DIAGNOSIS — E785 Hyperlipidemia, unspecified: Secondary | ICD-10-CM | POA: Diagnosis not present

## 2018-03-20 DIAGNOSIS — S065X9D Traumatic subdural hemorrhage with loss of consciousness of unspecified duration, subsequent encounter: Secondary | ICD-10-CM | POA: Diagnosis not present

## 2018-03-20 DIAGNOSIS — Z95810 Presence of automatic (implantable) cardiac defibrillator: Secondary | ICD-10-CM | POA: Diagnosis not present

## 2018-03-20 DIAGNOSIS — I509 Heart failure, unspecified: Secondary | ICD-10-CM | POA: Diagnosis not present

## 2018-03-20 DIAGNOSIS — S81811D Laceration without foreign body, right lower leg, subsequent encounter: Secondary | ICD-10-CM | POA: Diagnosis not present

## 2018-03-20 NOTE — Progress Notes (Signed)
Subjective:   Barbara Blackburn is a 84 y.o. female who presents for Medicare Annual (Subsequent) preventive examination.  Review of Systems:   Cardiac Risk Factors include: advanced age (>27men, >82 women);dyslipidemia;hypertension;obesity (BMI >30kg/m2)     Objective:     Vitals: BP 118/70 (BP Location: Left Arm, Patient Position: Sitting)   Pulse 81   Temp 97.9 F (36.6 C) (Temporal)   Resp 16   Ht 5' (1.524 m)   Wt 175 lb (79.4 kg)   LMP  (LMP Unknown)   SpO2 96%   BMI 34.18 kg/m   Body mass index is 34.18 kg/m.  Advanced Directives 03/20/2018 03/14/2017 12/06/2016 06/13/2016 03/03/2016 02/14/2016 01/10/2016  Does Patient Have a Medical Advance Directive? Yes Yes Yes Yes Yes Yes Yes  Type of Advance Directive Living will;Healthcare Power of [place];Living will Healthcare Power of Attorney Living will;Out of facility DNR (pink MOST or yellow form);Healthcare Power of [place];Living will Out of facility DNR (pink MOST or yellow form);[place];Living will [place];Living will;Out of facility DNR (pink MOST or yellow form)  Does patient want to make changes to medical advance directive? - - No - Patient declined - - No - Patient declined -  Copy of [place] in Chart? No - copy requested Yes No - copy requested Yes - - No - copy requested  Pre-existing out of facility DNR order (yellow form or pink MOST form) - - - - - Yellow form placed in chart (order not valid for inpatient use) Physician notified to receive inpatient order    Tobacco Social History   Tobacco Use  Smoking Status Never Smoker  Smokeless Tobacco Never Used     Counseling given: Not Answered   Clinical Intake:  Pre-visit preparation completed: Yes  Pain : 0-10 Pain Score: 5  Pain Type: Chronic pain Pain Location: Hip Pain Orientation: Right Pain Descriptors / Indicators: Aching Pain  Onset: More than a month ago Pain Frequency: Intermittent     Nutritional Status: BMI > 30  Obese Nutritional Risks: None Diabetes: No  How often do you need to have someone help you when you read instructions, pamphlets, or other written materials from your doctor or pharmacy?: 1 - Never What is the last grade level you completed in school?: high school   Interpreter Needed?: No  Information entered by :: Sloka Volante,LPN   Past Medical History:  Diagnosis Date  . 6949-lead 11/11/2013  . Allergy   . CHF (congestive heart failure) (HCC)    class 2  . Chronic atrial fibrillation ([place])   . Coronary artery disease   . Degenerative joint disease    severe  . Dyslipidemia   . Hyperlipidemia   . ICD-Medtronic 05/10/2009   Qualifier: Diagnosis of  By: Lovena Le, MD, [facility], Binnie Kand   . Nonischemic cardiomyopathy [facility])    Past Surgical History:  Procedure Laterality Date  . ICD GENERATOR CHANGEOUT N/A 12/06/2016   Procedure: ICD Generator Changeout;  Surgeon: Deboraha Sprang, MD;  Location: [facility];  Service: Cardiovascular;  Laterality: N/A;  . Medtronic dual-chamber ICD  01/05/2006   generator change April 2012  . PCI stent    . REPLACEMENT TOTAL KNEE     Family History  Problem Relation Age of Onset  . Heart attack Father   . Cancer Mother        colon  . Hypertension Sister   .  Aneurysm Sister   . Aneurysm Maternal Grandmother   . Cancer Sister        rectal   Social History   Socioeconomic History  . Marital status: Widowed    Spouse name: Not on file  . Number of children: Not on file  . Years of education: Not on file  . Highest education level: Not on file  Occupational History  . Not on file  Social Needs  . Financial resource strain: Not hard at all  . Food insecurity:    Worry: Never true    Inability: Never true  . Transportation needs:    Medical: No    Non-medical: No  Tobacco Use  . Smoking status: Never Smoker  . Smokeless  tobacco: Never Used  Substance and Sexual Activity  . Alcohol use: No  . Drug use: No  . Sexual activity: Never  Lifestyle  . Physical activity:    Days per week: 0 days    Minutes per session: 0 min  . Stress: Not at all  Relationships  . Social connections:    Talks on phone: More than three times a week    Gets together: More than three times a week    Attends religious service: Never    Active member of club or organization: No    Attends meetings of clubs or organizations: Never    Relationship status: Widowed  Other Topics Concern  . Not on file  Social History Narrative  . Not on file    Outpatient Encounter Medications as of 03/20/2018  Medication Sig  . amLODipine (NORVASC) 2.5 MG tablet Take 1 tablet (2.5 mg total) by mouth daily.  . Cholecalciferol (VITAMIN D) 2000 units tablet Take 2,000 Units by mouth daily.  . diclofenac sodium (VOLTAREN) 1 % GEL Apply topically.  . furosemide (LASIX) 40 MG tablet TAKE 1 TABLET BY MOUTH  DAILY WITH LUNCH  . losartan (COZAAR) 50 MG tablet TAKE 1 TABLET BY MOUTH  DAILY  . Probiotic Product (ALIGN) 4 MG CAPS Take 4 mg by mouth daily.   . rivaroxaban (XARELTO) 20 MG TABS tablet Take 1 tablet (20 mg total) by mouth daily with supper.  . rosuvastatin (CRESTOR) 10 MG tablet TAKE 1 TABLET BY MOUTH  DAILY  . sertraline (ZOLOFT) 50 MG tablet Take 1 tablet (50 mg total) by mouth daily.  [name] ENSURE PLUS (ENSURE PLUS) LIQD Take 237 mLs by mouth.   No facility-administered encounter medications on file as of 03/20/2018.     Activities of Daily Living In your present state of health, do you have any difficulty performing the following activities: 03/20/2018 10/05/2017  Hearing? Tempie Donning  Vision? N N  Difficulty concentrating or making decisions? Tempie Donning  Walking or climbing stairs? N N  Dressing or bathing? N N  Doing errands, shopping? Aggie Moats  Comment daughter assists  -  Conservation officer, nature and eating ? N Y  Using the Toilet? N N  In the past six months,  have you accidently leaked urine? N N  Do you have problems with loss of bowel control? N N  Managing your Medications? Tempie Donning  Comment daughter assists  daughter prepared medications  Managing your Finances? Tempie Donning  Comment daughter assists  daughter Manufacturing engineer or managing your Housekeeping? N Y  Comment daughters helps some  daughter assists  Some recent data might be hidden    Patient Care Team: Kathrine Haddock, NP as PCP - General (Nurse  Practitioner) Winfield Rast, DC as Referring Physician (Chiropractic Medicine) Deboraha Sprang, MD as Consulting Physician (Cardiology) Minna Merritts, MD as Consulting Physician (Cardiology) Marry Guan Laurice Record, MD (Orthopedic Surgery)    Assessment:   This is a routine wellness examination for Shandi.  Exercise Activities and Dietary recommendations Current Exercise Habits: The patient does not participate in regular exercise at present(PT twice a week currently ), Exercise limited by: None identified  Goals    . DIET - INCREASE WATER INTAKE     Recommend drinking at least 6-8 glasses of water a day        Fall Risk Fall Risk  03/20/2018 10/05/2017 09/24/2017 09/03/2017 08/02/2017  Falls in the past year? Yes Yes No No No  Comment - - - - -  Number falls in past yr: 1 1 - - 1  Comment - - - - -  Injury with Fall? Yes Yes - - Yes  Comment - - - - -  Risk Factor Category  High Fall Risk High Fall Risk - High Fall Risk High Fall Risk  Risk for fall due to : - History of fall(s);Impaired balance/gait;Impaired mobility - History of fall(s);Impaired balance/gait;Impaired mobility Impaired balance/gait;History of fall(s);Impaired mobility  Follow up Falls evaluation completed;Falls prevention discussed Falls evaluation completed;Education provided;Falls prevention discussed - Falls evaluation completed;Education provided;Falls prevention discussed Falls evaluation completed;Falls prevention discussed   Is the patient's home free of loose throw  rugs in walkways, pet beds, electrical cords, etc?   yes      Grab bars in the bathroom? yes      Handrails on the stairs?   yes, with ramp       Adequate lighting?   yes  Timed Get Up and Go performed: Completed in 9 seconds with  use of assistive devices-walker , steady gait. No intervention needed at this time.   Depression Screen PHQ 2/9 Scores 03/20/2018 02/26/2018 10/05/2017 09/24/2017  PHQ - 2 Score 2 4 2 2   PHQ- 9 Score 7 7 5 5      Cognitive Function     6CIT Screen 03/20/2018 03/14/2017  What Year? 4 points 0 points  What month? 0 points 0 points  What time? 0 points 0 points  Count back from 20 2 points 0 points  Months in reverse 0 points 0 points  Repeat phrase 4 points 4 points  Total Score 10 4    Immunization History  Administered Date(s) Administered  . Influenza, High Dose Seasonal PF 05/31/2016, 06/22/2017  . Influenza,inj,Quad PF,6+ Mos 05/24/2015  . Pneumococcal Conjugate-13 04/08/2014  . Pneumococcal Polysaccharide-23 12/29/2003  . Td 09/12/2016  . Tdap 01/01/2018    Qualifies for Shingles Vaccine? Yes,disucussed shingrix vaccine  Screening Tests Health Maintenance  Topic Date Due  . INFLUENZA VACCINE  03/28/2018  . TETANUS/TDAP  01/02/2028  . DEXA SCAN  Completed  . PNA vac Low Risk Adult  Completed    Cancer Screenings: Lung: Low Dose CT Chest recommended if Age 29-80 years, 30 pack-year currently smoking OR have quit w/in 15years. Patient does not qualify. Breast:  Up to date on Mammogram? Yes  No longer required Up to date of Bone Density/Dexa? Yes no longer required Colorectal: no longer required   Additional Screenings:  Hepatitis C Screening: not indicated      Plan:    I have personally reviewed and addressed the Medicare Annual Wellness questionnaire and have noted the following in the patient's chart:  A. Medical and social history B.  Use of alcohol, tobacco or illicit drugs  C. Current medications and supplements D. Functional  ability and status E.  Nutritional status F.  Physical activity G. Advance directives H. List of other physicians I.  Hospitalizations, surgeries, and ER visits in previous 12 months J.  [place] such as hearing and vision if needed, cognitive and depression L. Referrals and appointments   In addition, I have reviewed and discussed with patient certain preventive protocols, quality metrics, and best practice recommendations. A written personalized care plan for preventive services as well as general preventive health recommendations were provided to patient.   Signed,  Tyler Aas, LPN Nurse Health Advisor   Nurse Notes: none

## 2018-03-20 NOTE — Patient Instructions (Addendum)
Barbara Blackburn , Thank you for taking time to come for your Medicare Wellness Visit. I appreciate your ongoing commitment to your health goals. Please review the following plan we discussed and let me know if I can assist you in the future.    Screening recommendations/referrals: Colonoscopy: completed 11/05/2008, no longer required Mammogram: no longer required Bone Density: completed 07/05/2010, no longer required Recommended yearly ophthalmology/optometry visit for glaucoma screening and checkup Recommended yearly dental visit for hygiene and checkup  Vaccinations: Influenza vaccine: up to date, due 04/2018 Pneumococcal vaccine: up to date Tdap vaccine: up to date Shingles vaccine: shingrix eligible, check with your insurance company for coverage   Advanced directives: copy on file, you requested no changes at this time   Conditions/risks identified: Recommend drinking at least 6-8 glasses of water a day  Next appointment: Follow up in one year for your annual wellness exam.     Preventive Care 65 Years and Older, Female Preventive care refers to lifestyle choices and visits with your health care provider that can promote health and wellness. What does preventive care include?  A yearly physical exam. This is also called an annual well check.  Dental exams once or twice a year.  Routine eye exams. Ask your health care provider how often you should have your eyes checked.  Personal lifestyle choices, including:  Daily care of your teeth and gums.  Regular physical activity.  Eating a healthy diet.  Avoiding tobacco and drug use.  Limiting alcohol use.  Practicing safe sex.  Taking low-dose aspirin every day.  Taking vitamin and mineral supplements as recommended by your health care provider. What happens during an annual well check? The services and screenings done by your health care provider during your annual well check will depend on your age, overall health,  lifestyle risk factors, and family history of disease. Counseling  Your health care provider may ask you questions about your:  Alcohol use.  Tobacco use.  Drug use.  Emotional well-being.  Home and relationship well-being.  Sexual activity.  Eating habits.  History of falls.  Memory and ability to understand (cognition).  Work and work Statistician.  Reproductive health. Screening  You may have the following tests or measurements:  Height, weight, and BMI.  Blood pressure.  Lipid and cholesterol levels. These may be checked every 5 years, or more frequently if you are over 40 years old.  Skin check.  Lung cancer screening. You may have this screening every year starting at age 27 if you have a 30-pack-year history of smoking and currently smoke or have quit within the past 15 years.  Fecal occult blood test (FOBT) of the stool. You may have this test every year starting at age 35.  Flexible sigmoidoscopy or colonoscopy. You may have a sigmoidoscopy every 5 years or a colonoscopy every 10 years starting at age 54.  Hepatitis C blood test.  Hepatitis B blood test.  Sexually transmitted disease (STD) testing.  Diabetes screening. This is done by checking your blood sugar (glucose) after you have not eaten for a while (fasting). You may have this done every 1-3 years.  Bone density scan. This is done to screen for osteoporosis. You may have this done starting at age 77.  Mammogram. This may be done every 1-2 years. Talk to your health care provider about how often you should have regular mammograms. Talk with your health care provider about your test results, treatment options, and if necessary, the need for more  tests. Vaccines  Your health care provider may recommend certain vaccines, such as:  Influenza vaccine. This is recommended every year.  Tetanus, diphtheria, and acellular pertussis (Tdap, Td) vaccine. You may need a Td booster every 10 years.  Zoster  vaccine. You may need this after age 82.  Pneumococcal 13-valent conjugate (PCV13) vaccine. One dose is recommended after age 26.  Pneumococcal polysaccharide (PPSV23) vaccine. One dose is recommended after age 35. Talk to your health care provider about which screenings and vaccines you need and how often you need them. This information is not intended to replace advice given to you by your health care provider. Make sure you discuss any questions you have with your health care provider. Document Released: 09/10/2015 Document Revised: 05/03/2016 Document Reviewed: 06/15/2015 Elsevier Interactive Patient Education  2017 [place] Prevention in the Home Falls can cause injuries. They can happen to people of all ages. There are many things you can do to make your home safe and to help prevent falls. What can I do on the outside of my home?  Regularly fix the edges of walkways and driveways and fix any cracks.  Remove anything that might make you trip as you walk through a door, such as a raised step or threshold.  Trim any bushes or trees on the path to your home.  Use bright outdoor lighting.  Clear any walking paths of anything that might make someone trip, such as rocks or tools.  Regularly check to see if handrails are loose or broken. Make sure that both sides of any steps have handrails.  Any raised decks and porches should have guardrails on the edges.  Have any leaves, snow, or ice cleared regularly.  Use sand or salt on walking paths during winter.  Clean up any spills in your garage right away. This includes oil or grease spills. What can I do in the bathroom?  Use night lights.  Install grab bars by the toilet and in the tub and shower. Do not use towel bars as grab bars.  Use non-skid mats or decals in the tub or shower.  If you need to sit down in the shower, use a plastic, non-slip stool.  Keep the floor dry. Clean up any water that spills on the  floor as soon as it happens.  Remove soap buildup in the tub or shower regularly.  Attach bath mats securely with double-sided non-slip rug tape.  Do not have throw rugs and other things on the floor that can make you trip. What can I do in the bedroom?  Use night lights.  Make sure that you have a light by your bed that is easy to reach.  Do not use any sheets or blankets that are too big for your bed. They should not hang down onto the floor.  Have a firm chair that has side arms. You can use this for support while you get dressed.  Do not have throw rugs and other things on the floor that can make you trip. What can I do in the kitchen?  Clean up any spills right away.  Avoid walking on wet floors.  Keep items that you use a lot in easy-to-reach places.  If you need to reach something above you, use a strong step stool that has a grab bar.  Keep electrical cords out of the way.  Do not use floor polish or wax that makes floors slippery. If you must use wax, use non-skid floor  wax.  Do not have throw rugs and other things on the floor that can make you trip. What can I do with my stairs?  Do not leave any items on the stairs.  Make sure that there are handrails on both sides of the stairs and use them. Fix handrails that are broken or loose. Make sure that handrails are as long as the stairways.  Check any carpeting to make sure that it is firmly attached to the stairs. Fix any carpet that is loose or worn.  Avoid having throw rugs at the top or bottom of the stairs. If you do have throw rugs, attach them to the floor with carpet tape.  Make sure that you have a light switch at the top of the stairs and the bottom of the stairs. If you do not have them, ask someone to add them for you. What else can I do to help prevent falls?  Wear shoes that:  Do not have high heels.  Have rubber bottoms.  Are comfortable and fit you well.  Are closed at the toe. Do not wear  sandals.  If you use a stepladder:  Make sure that it is fully opened. Do not climb a closed stepladder.  Make sure that both sides of the stepladder are locked into place.  Ask someone to hold it for you, if possible.  Clearly mark and make sure that you can see:  Any grab bars or handrails.  First and last steps.  Where the edge of each step is.  Use tools that help you move around (mobility aids) if they are needed. These include:  Canes.  Walkers.  Scooters.  Crutches.  Turn on the lights when you go into a dark area. Replace any light bulbs as soon as they burn out.  Set up your furniture so you have a clear path. Avoid moving your furniture around.  If any of your floors are uneven, fix them.  If there are any pets around you, be aware of where they are.  Review your medicines with your doctor. Some medicines can make you feel dizzy. This can increase your chance of falling. Ask your doctor what other things that you can do to help prevent falls. This information is not intended to replace advice given to you by your health care provider. Make sure you discuss any questions you have with your health care provider. Document Released: 06/10/2009 Document Revised: 01/20/2016 Document Reviewed: 09/18/2014 Elsevier Interactive Patient Education  2017 [employer].

## 2018-03-25 DIAGNOSIS — S065X9D Traumatic subdural hemorrhage with loss of consciousness of unspecified duration, subsequent encounter: Secondary | ICD-10-CM | POA: Diagnosis not present

## 2018-03-25 DIAGNOSIS — Z95810 Presence of automatic (implantable) cardiac defibrillator: Secondary | ICD-10-CM | POA: Diagnosis not present

## 2018-03-25 DIAGNOSIS — I110 Hypertensive heart disease with heart failure: Secondary | ICD-10-CM | POA: Diagnosis not present

## 2018-03-25 DIAGNOSIS — E785 Hyperlipidemia, unspecified: Secondary | ICD-10-CM | POA: Diagnosis not present

## 2018-03-25 DIAGNOSIS — I4891 Unspecified atrial fibrillation: Secondary | ICD-10-CM | POA: Diagnosis not present

## 2018-03-25 DIAGNOSIS — S81811D Laceration without foreign body, right lower leg, subsequent encounter: Secondary | ICD-10-CM | POA: Diagnosis not present

## 2018-03-25 DIAGNOSIS — Z7901 Long term (current) use of anticoagulants: Secondary | ICD-10-CM | POA: Diagnosis not present

## 2018-03-25 DIAGNOSIS — S0232XD Fracture of orbital floor, left side, subsequent encounter for fracture with routine healing: Secondary | ICD-10-CM | POA: Diagnosis not present

## 2018-03-25 DIAGNOSIS — I509 Heart failure, unspecified: Secondary | ICD-10-CM | POA: Diagnosis not present

## 2018-03-26 DIAGNOSIS — S81811D Laceration without foreign body, right lower leg, subsequent encounter: Secondary | ICD-10-CM | POA: Diagnosis not present

## 2018-03-26 DIAGNOSIS — I4891 Unspecified atrial fibrillation: Secondary | ICD-10-CM | POA: Diagnosis not present

## 2018-03-26 DIAGNOSIS — S065X9D Traumatic subdural hemorrhage with loss of consciousness of unspecified duration, subsequent encounter: Secondary | ICD-10-CM | POA: Diagnosis not present

## 2018-03-26 DIAGNOSIS — Z95810 Presence of automatic (implantable) cardiac defibrillator: Secondary | ICD-10-CM | POA: Diagnosis not present

## 2018-03-26 DIAGNOSIS — E785 Hyperlipidemia, unspecified: Secondary | ICD-10-CM | POA: Diagnosis not present

## 2018-03-26 DIAGNOSIS — S0232XD Fracture of orbital floor, left side, subsequent encounter for fracture with routine healing: Secondary | ICD-10-CM | POA: Diagnosis not present

## 2018-03-26 DIAGNOSIS — I509 Heart failure, unspecified: Secondary | ICD-10-CM | POA: Diagnosis not present

## 2018-03-26 DIAGNOSIS — Z7901 Long term (current) use of anticoagulants: Secondary | ICD-10-CM | POA: Diagnosis not present

## 2018-03-26 DIAGNOSIS — I110 Hypertensive heart disease with heart failure: Secondary | ICD-10-CM | POA: Diagnosis not present

## 2018-04-03 ENCOUNTER — Other Ambulatory Visit: Payer: Self-pay | Admitting: Cardiovascular Disease

## 2018-04-03 NOTE — Telephone Encounter (Signed)
Refill Request.  

## 2018-04-13 LAB — CUP PACEART REMOTE DEVICE CHECK
Brady Statistic AP VS Percent: 0 %
Brady Statistic AS VP Percent: 0 %
Brady Statistic AS VS Percent: 0 %
Date Time Interrogation Session: 20190723052305
HighPow Impedance: 45 Ohm
HighPow Impedance: 57 Ohm
Implantable Lead Implant Date: 20070511
Implantable Lead Location: 753860
Implantable Lead Model: 5076
Implantable Lead Model: 5092
Implantable Lead Model: 6949
Lead Channel Impedance Value: 399 Ohm
Lead Channel Pacing Threshold Amplitude: 0.625 V
Lead Channel Pacing Threshold Pulse Width: 0.4 ms
Lead Channel Sensing Intrinsic Amplitude: 0.75 mV
Lead Channel Setting Pacing Pulse Width: 0.4 ms
Lead Channel Setting Sensing Sensitivity: 0.3 mV
MDC IDC LEAD IMPLANT DT: 20040220
MDC IDC LEAD IMPLANT DT: 20070426
MDC IDC LEAD LOCATION: 753859
MDC IDC LEAD LOCATION: 753860
MDC IDC MSMT BATTERY REMAINING LONGEVITY: 118 mo
MDC IDC MSMT BATTERY VOLTAGE: 3.02 V
MDC IDC MSMT LEADCHNL RA SENSING INTR AMPL: 0.375 mV
MDC IDC MSMT LEADCHNL RV IMPEDANCE VALUE: 380 Ohm
MDC IDC MSMT LEADCHNL RV IMPEDANCE VALUE: 456 Ohm
MDC IDC MSMT LEADCHNL RV SENSING INTR AMPL: 2.875 mV
MDC IDC MSMT LEADCHNL RV SENSING INTR AMPL: 2.875 mV
MDC IDC PG IMPLANT DT: 20180411
MDC IDC SET LEADCHNL RV PACING AMPLITUDE: 2.5 V
MDC IDC STAT BRADY AP VP PERCENT: 0 %
MDC IDC STAT BRADY RA PERCENT PACED: 0 %
MDC IDC STAT BRADY RV PERCENT PACED: 12.59 %

## 2018-05-06 LAB — CUP PACEART INCLINIC DEVICE CHECK
Battery Remaining Longevity: 119 mo
Battery Voltage: 3.02 V
Brady Statistic AP VP Percent: 0 %
Brady Statistic AP VS Percent: 0 %
Brady Statistic AS VS Percent: 90.37 %
Brady Statistic RA Percent Paced: 0 %
Brady Statistic RV Percent Paced: 14.53 %
HighPow Impedance: 45 Ohm
HighPow Impedance: 55 Ohm
Implantable Lead Implant Date: 20070426
Implantable Lead Implant Date: 20070511
Implantable Lead Location: 753860
Implantable Lead Location: 753860
Implantable Lead Model: 5076
Implantable Lead Model: 5092
Implantable Lead Model: 6949
Implantable Pulse Generator Implant Date: 20180411
Lead Channel Impedance Value: 399 Ohm
Lead Channel Impedance Value: 399 Ohm
Lead Channel Impedance Value: 494 Ohm
Lead Channel Pacing Threshold Pulse Width: 0.4 ms
Lead Channel Sensing Intrinsic Amplitude: 0.375 mV
Lead Channel Sensing Intrinsic Amplitude: 2.125 mV
Lead Channel Sensing Intrinsic Amplitude: 2.5 mV
Lead Channel Setting Pacing Amplitude: 2.5 V
Lead Channel Setting Pacing Pulse Width: 0.4 ms
MDC IDC LEAD IMPLANT DT: 20040220
MDC IDC LEAD LOCATION: 753859
MDC IDC MSMT LEADCHNL RA SENSING INTR AMPL: 0.75 mV
MDC IDC MSMT LEADCHNL RV PACING THRESHOLD AMPLITUDE: 0.625 V
MDC IDC SESS DTM: 20190625175700
MDC IDC SET LEADCHNL RV SENSING SENSITIVITY: 0.3 mV
MDC IDC STAT BRADY AS VP PERCENT: 9.63 %

## 2018-05-08 DIAGNOSIS — M1711 Unilateral primary osteoarthritis, right knee: Secondary | ICD-10-CM | POA: Diagnosis not present

## 2018-05-08 DIAGNOSIS — M25561 Pain in right knee: Secondary | ICD-10-CM | POA: Diagnosis not present

## 2018-05-22 ENCOUNTER — Other Ambulatory Visit: Payer: Self-pay | Admitting: Unknown Physician Specialty

## 2018-05-22 DIAGNOSIS — E7800 Pure hypercholesterolemia, unspecified: Secondary | ICD-10-CM

## 2018-05-22 NOTE — Telephone Encounter (Signed)
rosuvastatin refill Last Refill:10/24/17 # 90 tab 1 RF Last OV: 06/22/17 PCP: Kathrine Haddock NP Pharmacy:[facility] [phone] [address] Pt has had 3 or more refills without an office visit

## 2018-05-22 NOTE — Telephone Encounter (Signed)
Will send 90 days. Per cheryls note, was supposed to have 3 mo follow up but this is not scheduled. Please call to schedule follow up to ensure future refills.

## 2018-06-03 ENCOUNTER — Other Ambulatory Visit: Payer: Self-pay | Admitting: Unknown Physician Specialty

## 2018-06-03 ENCOUNTER — Ambulatory Visit (INDEPENDENT_AMBULATORY_CARE_PROVIDER_SITE_OTHER): Payer: Medicare Other | Admitting: Nurse Practitioner

## 2018-06-03 ENCOUNTER — Encounter: Payer: Self-pay | Admitting: Nurse Practitioner

## 2018-06-03 VITALS — BP 129/76 | HR 83 | Temp 98.3°F | Ht 61.0 in | Wt 176.6 lb

## 2018-06-03 DIAGNOSIS — F0390 Unspecified dementia without behavioral disturbance: Secondary | ICD-10-CM

## 2018-06-03 DIAGNOSIS — I10 Essential (primary) hypertension: Secondary | ICD-10-CM

## 2018-06-03 NOTE — Telephone Encounter (Signed)
Your patient, seeing her today

## 2018-06-03 NOTE — Telephone Encounter (Signed)
Review for refill  Of sertraline 50 mg. LR 02/26/18   Pt requesting to change to  [facility] from [facility].  PCP  [name], NP  Has an appointment for today at 3:30.

## 2018-06-03 NOTE — Telephone Encounter (Signed)
Patient has been on for several months without ADR.

## 2018-06-03 NOTE — Assessment & Plan Note (Signed)
Stable on visit today 129/76.  Avoid hypotension, due to fall risk.  Continue present treatment regimen.

## 2018-06-03 NOTE — Progress Notes (Signed)
BP 129/76   Pulse 83   Temp 98.3 F (36.8 C) (Oral)   Ht 5\' 1"  (1.549 m)   Wt 176 lb 9.6 oz (80.1 kg)   LMP  (LMP Unknown)   SpO2 96%   BMI 33.37 kg/m    Subjective:    Patient ID: Barbara Blackburn, female    DOB: 1929-01-17, 84 y.o.   MRN: [id number]  HPI: Barbara Blackburn is a 84 y.o. female  Chief Complaint  Patient presents with  . Dementia  . Hypertension   DEMENTIA: Chronic, progressive.  No current memory care medications, Aricept was ordered in past but patient "did not like it" and would not take it.  Lives at her daughter's house and daughter is primary care giver.  Had last fall in May 2019, with no further falls.  Although, her daughter reports she will often walk away without her walker.  No current neurology follow-up.  Continues to eat meals well and maintain weight.  Daughter reports patient has no behaviors at home, but has occasional bouts of depressed mood for which she takes Sertraline without issue.  HYPERTENSION Hypertension status: controlled  Satisfied with current treatment? yes Duration of hypertension: chronic BP monitoring frequency:  not checking BP range:  BP medication side effects:  no Medication compliance: excellent compliance Previous BP meds: can not recall, multiple per daughter report Aspirin: no Recurrent headaches: no Visual changes: no Palpitations: no Dyspnea: no Chest pain: no Lower extremity edema: yes, no TED hose due to recent fall in May 2019 Dizzy/lightheaded: no Daughter reports pt takes her HTN medicine regularly without issue.    Relevant past medical, surgical, family and social history reviewed and updated as indicated. Interim medical history since our last visit reviewed. Allergies and medications reviewed and updated.  Review of Systems  Constitutional: Negative for activity change and fatigue.  Respiratory: Negative for cough, chest tightness and shortness of breath.   Cardiovascular: Positive for leg  swelling. Negative for chest pain and palpitations.  Gastrointestinal: Negative for abdominal distention and abdominal pain.  Genitourinary: Negative for difficulty urinating, dysuria and urgency.  Musculoskeletal: Negative for back pain.  Neurological: Negative for dizziness and headaches.  Psychiatric/Behavioral:       Memory loss     Per HPI unless specifically indicated above     Objective:    BP 129/76   Pulse 83   Temp 98.3 F (36.8 C) (Oral)   Ht 5\' 1"  (1.549 m)   Wt 176 lb 9.6 oz (80.1 kg)   LMP  (LMP Unknown)   SpO2 96%   BMI 33.37 kg/m   Wt Readings from Last 3 Encounters:  06/03/18 176 lb 9.6 oz (80.1 kg)  03/20/18 175 lb (79.4 kg)  02/26/18 175 lb 6.4 oz (79.6 kg)    Physical Exam  Constitutional: She appears well-developed and well-nourished.  HENT:  Head: Normocephalic and atraumatic.  Eyes: Pupils are equal, round, and reactive to light.  Cardiovascular: Normal rate, regular rhythm and normal heart sounds.  Pulmonary/Chest: Effort normal and breath sounds normal.  Abdominal: Soft. Bowel sounds are normal.  Neurological: She is alert.  Oriented to self, location, and month.  Reports year as 27.  MMSE today 22/30   Skin: Skin is warm and dry.  Psychiatric: She has a normal mood and affect. Her behavior is normal.      Results for orders placed or performed in visit on 03/19/18  [facility] [place] CHECK  Result Value Ref  Range   Date Time Interrogation Session [phone]    Pulse Generator Manufacturer MERM    Pulse Gen Model [id number] Evera XT DR    Pulse Gen Serial Number [id number]    Clinic Name [facility]    Implantable Pulse Generator Type Implantable Cardiac Defibulator    Implantable Pulse Generator Implant Date [id number]    Implantable Lead Manufacturer MERM    Implantable Lead Model 5076 CapSureFix Novus    Implantable Lead Serial Number [id number]    Implantable Lead Implant Date [id number]    Implantable Lead Location  Detail 1 APPENDAGE    Implantable Lead Location [id number]    Implantable Lead Manufacturer [facility]    Implantable Lead Model 716 580 4073 Sprint Fidelis    Implantable Lead Serial Number [id number]    Implantable Lead Implant Date [id number]    Implantable Lead Location Detail 1 APEX    Implantable Lead Location [id number]    Implantable Lead Manufacturer MERM    Implantable Lead Model [id number] CapSure SP Novus    Implantable Lead Serial Number [id number]    Implantable Lead Implant Date [id number]    Implantable Lead Location [phone]    Lead Channel Setting Sensing Sensitivity 0.3 mV   Lead Channel Setting Pacing Pulse Width 0.4 ms   Lead Channel Setting Pacing Amplitude 2.5 V   Lead Channel Impedance Value 399 ohm   Lead Channel Sensing Intrinsic Amplitude 0.75 mV   Lead Channel Sensing Intrinsic Amplitude 0.375 mV   Lead Channel Impedance Value 456 ohm   Lead Channel Impedance Value 380 ohm   Lead Channel Sensing Intrinsic Amplitude 2.875 mV   Lead Channel Sensing Intrinsic Amplitude 2.875 mV   Lead Channel Pacing Threshold Amplitude 0.625 V   Lead Channel Pacing Threshold Pulse Width 0.4 ms   HighPow Impedance 45 ohm   HighPow Impedance 57 ohm   Battery Status OK    Battery Remaining Longevity 118 mo   Battery Voltage 3.02 V   Brady Statistic RA Percent Paced 0 %   Brady Statistic RV Percent Paced 12.59 %   Brady Statistic AP VP Percent 0 %   Brady Statistic AS VP Percent 0 %   Brady Statistic AP VS Percent 0 %   Brady Statistic AS VS Percent 0 %   Eval Rhythm Af/Vs       Assessment & Plan:   Problem List Items Addressed This Visit      Cardiovascular and Mediastinum   Essential hypertension, benign    Stable on visit today 129/76.  Avoid hypotension, due to fall risk.  Continue present treatment regimen.        Nervous and Auditory   Dementia ([place]) - Primary     MMSE today 22/30. No current memory care medications as pt refused to take Aricept in past and reports she does not want any  meds.  Will refer to Dr. Manuella Ghazi at [facility] for further evaluation and recommendations.   B12 level obtained today.  Last fall May 2019 with head trauma.  Encouraged daughter to continue to monitor gait closely and utilize non-skid socks when patient is in the house to prevent further falls.  Discussed palliative consult in future.      Relevant Orders   Ambulatory referral to Neurology   B12       Follow up plan: Return in about 3 months (around 09/03/2018).

## 2018-06-03 NOTE — Assessment & Plan Note (Addendum)
MMSE today 22/30. No current memory care medications as pt refused to take Aricept in past and reports she does not want any meds.  Will refer to Dr. Manuella Ghazi at [facility] for further evaluation and recommendations.   B12 level obtained today.  Last fall May 2019 with head trauma.  Encouraged daughter to continue to monitor gait closely and utilize non-skid socks when patient is in the house to prevent further falls.  Discussed palliative consult in future.

## 2018-06-03 NOTE — Patient Instructions (Signed)
Dementia Caregiver Guide Dementia is a term used to describe a number of symptoms that affect memory and thinking. The most common symptoms include:  Memory loss.  Trouble with language and communication.  Trouble concentrating.  Poor judgment.  Problems with reasoning.  Child-like behavior and language.  Extreme anxiety.  Angry outbursts.  Wandering from home or public places.  Dementia usually gets worse slowly over time. In the early stages, people with dementia can stay independent and safe with some help. In later stages, they need help with daily tasks such as dressing, grooming, and using the bathroom. How to help the person with dementia cope Dementia can be frightening and confusing. Here are some tips to help the person with dementia cope with changes caused by the disease. General tips  Keep the person on track with his or her routine.  Try to identify areas where the person may need help.  Be supportive, patient, calm, and encouraging.  Gently remind the person that adjusting to changes takes time.  Help with the tasks that the person has asked for help with.  Keep the person involved in daily tasks and decisions as much as possible.  Encourage conversation, but try not to get frustrated or harried if the person struggles to find words or does not seem to appreciate your help. Communication tips  When the person is talking or seems frustrated, make eye contact and hold the person's hand.  Ask specific questions that need yes or no answers.  Use simple words, short sentences, and a calm voice. Only give one direction at a time.  When offering choices, limit them to just 1 or 2.  Avoid correcting the person in a negative way.  If the person is struggling to find the right words, gently try to help him or her. How to recognize symptoms of stress Symptoms of stress in caregivers include:  Feeling frustrated or angry with the person with  dementia.  Denying that the person has dementia or that his or her symptoms will not improve.  Feeling hopeless and unappreciated.  Difficulty sleeping.  Difficulty concentrating.  Feeling anxious, irritable, or depressed.  Developing stress-related health problems.  Feeling like you have too little time for your own life.  Follow these instructions at home:  Make sure that you and the person you are caring for: ? Get regular sleep. ? Exercise regularly. ? Eat regular, nutritious meals. ? Drink enough fluid to keep your urine clear or pale yellow. ? Take over-the-counter and prescription medicines only as told by your health care providers. ? Attend all scheduled health care appointments.  Join a support group with others who are caregivers.  Ask about respite care resources so that you can have a regular break from the stress of caregiving.  Look for signs of stress in yourself and in the person you are caring for. If you notice signs of stress, take steps to manage it.  Consider any safety risks and take steps to avoid them.  Organize medications in a pill box for each day of the week.  Create a plan to handle any legal or financial matters. Get legal or financial advice if needed.  Keep a calendar in a central location to remind the person of appointments or other activities. Tips for reducing the risk of injury  Keep floors clear of clutter. Remove rugs, magazine racks, and floor lamps.  Keep hallways well lit, especially at night.  Put a handrail and nonslip mat in the bathtub   or shower.  Put childproof locks on cabinets that contain dangerous items, such as medicines, alcohol, guns, toxic cleaning items, sharp tools or utensils, matches, and lighters.  Put the locks in places where the person cannot see or reach them easily. This will help ensure that the person does not wander out of the house and get lost.  Be prepared for emergencies. Keep a list of  emergency phone numbers and addresses in a convenient area.  Remove car keys and lock garage doors so that the person does not try to get in the car and drive.  Have the person wear a bracelet that tracks locations and identifies the person as having memory problems. This should be worn at all times for safety. Where to find support: Many individuals and organizations offer support. These include:  Support groups for people with dementia and for caregivers.  Counselors or therapists.  Home health care services.  Adult day care centers.  Where to find more information: Alzheimer's Association: [employer] Contact a health care provider if:  The person's health is rapidly getting worse.  You are no longer able to care for the person.  Caring for the person is affecting your physical and emotional health.  The person threatens himself or herself, you, or anyone else. Summary  Dementia is a term used to describe a number of symptoms that affect memory and thinking.  Dementia usually gets worse slowly over time.  Take steps to reduce the person's risk of injury, and to plan for future care.  Caregivers need support, relief from caregiving, and time for their own lives. This information is not intended to replace advice given to you by your health care provider. Make sure you discuss any questions you have with your health care provider. Document Released: 07/18/2016 Document Revised: 07/18/2016 Document Reviewed: 07/18/2016 Elsevier Interactive Patient Education  2018 [employer]. Dementia Dementia is the loss of two or more brain functions, such as:  Memory.  Decision making.  Behavior.  Speaking.  Thinking.  Problem solving.  There are many types of dementia. The most common type is called progressive dementia. Progressive dementia gets worse with time and it is irreversible. An example of this type of dementia is Alzheimer disease. What are the causes? This  condition may be caused by:  Nerve cell damage in the brain.  Genetic mutations.  Certain medicines.  Multiple small strokes.  An infection, such as chronic meningitis.  A metabolic problem, such as vitamin B12 deficiency or thyroid disease.  Pressure on the brain, such as from a tumor or blood clot.  What are the signs or symptoms? Symptoms of this condition include:  Sudden changes in mood.  Depression.  Problems with balance.  Changes in personality.  Poor short-term memory.  Agitation.  Delusions.  Hallucinations.  Having a hard time: ? Speaking thoughts. ? Finding words. ? Solving problems. ? Doing familiar tasks. ? Understanding familiar ideas.  How is this diagnosed? This condition is diagnosed with an assessment by your health care provider. During this assessment, your health care provider will talk with you and your family, friends, or caregivers about your symptoms. A thorough medical history will be taken, and you will have a physical exam and tests. Tests may include:  Lab tests, such as blood or urine tests.  Imaging tests, such as a CT scan, PET scan, or MRI.  A lumbar puncture. This test involves removing and testing a small amount of the fluid that surrounds the brain and  spinal cord.  An electroencephalogram (EEG). In this test, small metal discs are used to measure electrical activity in the brain.  Memory tests, cognitive tests, and neuropsychological tests. These tests evaluate brain function.  How is this treated? Treatment depends on the cause of the dementia. It may involve taking medicines that may help:  To control the dementia.  To slow down the disease.  To manage symptoms.  In some cases, treating the cause of the dementia can improve symptoms, reverse symptoms, or slow down how quickly the dementia gets worse. Your health care provider can help direct you to support groups, organizations, and other health care providers  who can help with decisions about your care. Follow these instructions at home: Medicine  Take over-the-counter and prescription medicines only as told by your health care provider.  Avoid taking medicines that can affect thinking, such as pain or sleeping medicines. Lifestyle   Make healthy lifestyle choices: ? Be physically active as told by your health care provider. ? Do not use any tobacco products, such as cigarettes, chewing tobacco, and e-cigarettes. If you need help quitting, ask your health care provider. ? Eat a healthy diet. ? Practice stress-management techniques when you get stressed. ? Stay social.  Drink enough fluid to keep your urine clear or pale yellow.  Make sure to get quality sleep. These tips can help you to get a good night's rest: ? Avoid napping during the day. ? Keep your sleeping area dark and cool. ? Avoid exercising during the few hours before you go to bed. ? Avoid caffeine products in the evening. General instructions  Work with your health care provider to determine what you need help with and what your safety needs are.  If you were given a bracelet that tracks your location, make sure to wear it.  Keep all follow-up visits as told by your health care provider. This is important. Contact a health care provider if:  You have any new symptoms.  You have problems with choking or swallowing.  You have any symptoms of a different illness. Get help right away if:  You develop a fever.  You have new or worsening confusion.  You have new or worsening sleepiness.  You have a hard time staying awake.  You or your family members become concerned for your safety. This information is not intended to replace advice given to you by your health care provider. Make sure you discuss any questions you have with your health care provider. Document Released: 02/07/2001 Document Revised: 12/23/2015 Document Reviewed: 05/12/2015 Elsevier Interactive  Patient Education  [employer].

## 2018-06-04 LAB — VITAMIN B12: Vitamin B-12: 265 pg/mL (ref 232–1245)

## 2018-06-11 DIAGNOSIS — H2513 Age-related nuclear cataract, bilateral: Secondary | ICD-10-CM | POA: Diagnosis not present

## 2018-06-11 DIAGNOSIS — E119 Type 2 diabetes mellitus without complications: Secondary | ICD-10-CM | POA: Diagnosis not present

## 2018-06-11 LAB — HM DIABETES EYE EXAM

## 2018-06-14 NOTE — Progress Notes (Signed)
a 

## 2018-06-18 ENCOUNTER — Other Ambulatory Visit: Payer: Self-pay

## 2018-06-18 ENCOUNTER — Ambulatory Visit (INDEPENDENT_AMBULATORY_CARE_PROVIDER_SITE_OTHER): Payer: Medicare Other | Admitting: *Deleted

## 2018-06-18 DIAGNOSIS — I5022 Chronic systolic (congestive) heart failure: Secondary | ICD-10-CM | POA: Diagnosis not present

## 2018-06-18 DIAGNOSIS — I428 Other cardiomyopathies: Secondary | ICD-10-CM

## 2018-06-18 NOTE — Progress Notes (Signed)
Remote ICD transmission.   

## 2018-06-18 NOTE — Patient Outreach (Signed)
[facility]) Care Management  06/18/2018  Barbara Blackburn 05/23/1929 [id number]   Medication Adherence call to Barbara Blackburn Left a message for patient to call back patient is due on Losartan 50 mg.Barbara Blackburn is showing past due under [place].   [place] Management Direct Dial [phone]  Fax [phone] [email or web address]

## 2018-06-19 ENCOUNTER — Encounter: Payer: Self-pay | Admitting: Cardiology

## 2018-07-10 LAB — CUP PACEART REMOTE DEVICE CHECK
Battery Remaining Longevity: 115 mo
Battery Voltage: 3.01 V
Brady Statistic AP VP Percent: 0 %
Brady Statistic AS VP Percent: 0 %
Brady Statistic AS VS Percent: 0 %
Date Time Interrogation Session: 20191022083824
HighPow Impedance: 44 Ohm
HighPow Impedance: 51 Ohm
Implantable Lead Implant Date: 20070426
Implantable Lead Implant Date: 20070511
Implantable Lead Location: 753860
Implantable Lead Model: 5092
Implantable Pulse Generator Implant Date: 20180411
Lead Channel Impedance Value: 399 Ohm
Lead Channel Impedance Value: 437 Ohm
Lead Channel Sensing Intrinsic Amplitude: 0.375 mV
Lead Channel Sensing Intrinsic Amplitude: 0.75 mV
Lead Channel Sensing Intrinsic Amplitude: 1.25 mV
Lead Channel Setting Pacing Amplitude: 2.5 V
MDC IDC LEAD IMPLANT DT: 20040220
MDC IDC LEAD LOCATION: 753859
MDC IDC LEAD LOCATION: 753860
MDC IDC MSMT LEADCHNL RV IMPEDANCE VALUE: 342 Ohm
MDC IDC MSMT LEADCHNL RV PACING THRESHOLD AMPLITUDE: 0.625 V
MDC IDC MSMT LEADCHNL RV PACING THRESHOLD PULSEWIDTH: 0.4 ms
MDC IDC MSMT LEADCHNL RV SENSING INTR AMPL: 1.25 mV
MDC IDC SET LEADCHNL RV PACING PULSEWIDTH: 0.4 ms
MDC IDC SET LEADCHNL RV SENSING SENSITIVITY: 0.3 mV
MDC IDC STAT BRADY AP VS PERCENT: 0 %
MDC IDC STAT BRADY RA PERCENT PACED: 0 %
MDC IDC STAT BRADY RV PERCENT PACED: 11.8 %

## 2018-08-16 DIAGNOSIS — I480 Paroxysmal atrial fibrillation: Secondary | ICD-10-CM | POA: Diagnosis not present

## 2018-08-16 DIAGNOSIS — R2689 Other abnormalities of gait and mobility: Secondary | ICD-10-CM | POA: Diagnosis not present

## 2018-08-16 DIAGNOSIS — S065X9A Traumatic subdural hemorrhage with loss of consciousness of unspecified duration, initial encounter: Secondary | ICD-10-CM | POA: Diagnosis not present

## 2018-08-16 DIAGNOSIS — R413 Other amnesia: Secondary | ICD-10-CM | POA: Diagnosis not present

## 2018-08-16 DIAGNOSIS — R296 Repeated falls: Secondary | ICD-10-CM | POA: Diagnosis not present

## 2018-08-19 ENCOUNTER — Other Ambulatory Visit: Payer: Self-pay | Admitting: Nurse Practitioner

## 2018-08-19 ENCOUNTER — Telehealth: Payer: Self-pay | Admitting: Nurse Practitioner

## 2018-08-19 MED ORDER — LOSARTAN POTASSIUM 50 MG PO TABS: 50.0000 mg | ORAL_TABLET | Freq: Every day | ORAL | 3 refills | Status: DC

## 2018-08-19 NOTE — Telephone Encounter (Signed)
[name] from [facility] called in stating that patient came in and is out of her losartan 50 mg and would like refill sent to [facility] patient would also like all meds to go to [facility] now as well. Please advise.

## 2018-08-19 NOTE — Telephone Encounter (Signed)
Sent over to [facility]

## 2018-08-19 NOTE — Progress Notes (Signed)
Losartan refill sent to [facility] per patient request.  She would like all refills to go to [facility] in future.

## 2018-08-23 ENCOUNTER — Telehealth: Payer: Self-pay

## 2018-08-23 ENCOUNTER — Telehealth: Payer: Self-pay | Admitting: Internal Medicine

## 2018-08-23 NOTE — Telephone Encounter (Signed)
New message    Daughter just stated that pt is on a new medication galamtamine 4 mg 2x a day  Which may have been the cause of her shock this morning .

## 2018-08-23 NOTE — Telephone Encounter (Signed)
Spoke with pts daughter [name] ( ok per DPR) who lives with pt most of the time. Informed her that pt had received a shock today about 10:43 am,  she stated that pt has been compliant with medications but that she was not with pt at this time her brother was and pt was currently at a hearing aid appointment. She stated that pt has been in her usual state of health lately, inform her that I would review this with Dr. Caryl Comes next week and would call back with any recommendations pts daughter voiced understanding. Also advised pts daughter that pt shouldn't drive daughter stated that pt doesn't drive.

## 2018-08-23 NOTE — Telephone Encounter (Signed)
Forwarding to device clinic to address/discuss w/ pt

## 2018-08-26 NOTE — Telephone Encounter (Signed)
Followed up with pt's daughter. I advised her there should be no indication for her mothers new medication to be the reason for her shock. Pt's daughter has agreed to bring in pt on 1/9 @ 1:45pm to see Dillon Bjork for follow up.

## 2018-09-03 ENCOUNTER — Encounter: Payer: Self-pay | Admitting: Nurse Practitioner

## 2018-09-03 ENCOUNTER — Other Ambulatory Visit: Payer: Self-pay

## 2018-09-03 ENCOUNTER — Ambulatory Visit (INDEPENDENT_AMBULATORY_CARE_PROVIDER_SITE_OTHER): Payer: Medicare Other | Admitting: Nurse Practitioner

## 2018-09-03 VITALS — BP 122/58 | HR 70 | Temp 97.6°F | Ht 61.0 in | Wt 174.0 lb

## 2018-09-03 DIAGNOSIS — I482 Chronic atrial fibrillation, unspecified: Secondary | ICD-10-CM

## 2018-09-03 DIAGNOSIS — Z7189 Other specified counseling: Secondary | ICD-10-CM

## 2018-09-03 DIAGNOSIS — M549 Dorsalgia, unspecified: Secondary | ICD-10-CM

## 2018-09-03 DIAGNOSIS — F0280 Dementia in other diseases classified elsewhere without behavioral disturbance: Secondary | ICD-10-CM | POA: Diagnosis not present

## 2018-09-03 DIAGNOSIS — I5022 Chronic systolic (congestive) heart failure: Secondary | ICD-10-CM | POA: Diagnosis not present

## 2018-09-03 DIAGNOSIS — I10 Essential (primary) hypertension: Secondary | ICD-10-CM

## 2018-09-03 DIAGNOSIS — G301 Alzheimer's disease with late onset: Secondary | ICD-10-CM

## 2018-09-03 DIAGNOSIS — E7800 Pure hypercholesterolemia, unspecified: Secondary | ICD-10-CM

## 2018-09-03 MED ORDER — ROSUVASTATIN CALCIUM 10 MG PO TABS: 10.0000 mg | ORAL_TABLET | Freq: Every day | ORAL | 0 refills | Status: DC

## 2018-09-03 MED ORDER — RIVAROXABAN 20 MG PO TABS: ORAL_TABLET | ORAL | 3 refills | Status: DC

## 2018-09-03 MED ORDER — SERTRALINE HCL 50 MG PO TABS: 50.0000 mg | ORAL_TABLET | Freq: Every day | ORAL | 3 refills | Status: DC

## 2018-09-03 MED ORDER — AMLODIPINE BESYLATE 2.5 MG PO TABS: 2.5000 mg | ORAL_TABLET | Freq: Every day | ORAL | 3 refills | Status: DC

## 2018-09-03 MED ORDER — GALANTAMINE HYDROBROMIDE 4 MG PO TABS: 4.0000 mg | ORAL_TABLET | Freq: Every day | ORAL | 0 refills | Status: DC

## 2018-09-03 MED ORDER — FUROSEMIDE 40 MG PO TABS: ORAL_TABLET | ORAL | 3 refills | Status: DC

## 2018-09-03 NOTE — Assessment & Plan Note (Signed)
Chronic, stable on recheck today.  Continue current med regimen.  Avoid hypotension d/t risk for falls.  Continue to collaborate with cardiology.

## 2018-09-03 NOTE — Assessment & Plan Note (Signed)
Continue current med regimen and collaboration with cardiology.

## 2018-09-03 NOTE — Assessment & Plan Note (Signed)
Will continue ongoing discussions.

## 2018-09-03 NOTE — Assessment & Plan Note (Signed)
Continue to collaborate with cardiology.

## 2018-09-03 NOTE — Progress Notes (Signed)
BP (!) 122/58 (BP Location: Left Arm, Patient Position: Sitting)   Pulse 70   Temp 97.6 F (36.4 C) (Oral)   Ht 5\' 1"  (1.549 m)   Wt 174 lb (78.9 kg)   LMP  (LMP Unknown)   SpO2 94%   BMI 32.88 kg/m    Subjective:    Patient ID: Barbara Blackburn, female    DOB: 06-13-1929, 85 y.o.   MRN: [id number]  HPI: Barbara Blackburn is a 85 y.o. female presents for follow-up visit  Chief Complaint  Patient presents with  . Hypertension    47m f/u  . Dementia   Daughter at bedside to assist in providing HPI and ROS.  HYPERTENSION & CHF Reports no issues with current medication regimen.  Is followed by Dr. Caryl Comes and sees him next on 09/05/17.  Has a defib device in place and per documentation in chart, and daughter report, patient recently had a shock event which patient did not notice on 08/23/18.  She is following up with cardiology in regard to recent event.  Pt continues to report no symptoms at time of event and currently denies CP, SOB, or fatigue.  We had at length discussion on goals of care at appointment today. Hypertension status: controlled  Satisfied with current treatment? yes Duration of hypertension: chronic BP monitoring frequency:  a few times a week BP range: 120/70's at home BP medication side effects:  no Medication compliance: good compliance Aspirin: no Recurrent headaches: no Visual changes: no Palpitations: no Dyspnea: no Chest pain: no Lower extremity edema: no Dizzy/lightheaded: no   DEMENTIA: This is chronic, progressive in nature.  She had initial appointment with Dr. Manuella Ghazi on 08/16/18 and he initiated Galantamine 4 MG BID.  However, her daughter reports she has only been giving this once a day at night and is asking for refills on medication and for script to be changed to once a day.  She reports concerns for side effects, including recent shock event.  Discussed side effects with patient and daughter, including nausea. Patient and daughter deny N&V, CP,  SOB, dizziness, syncope, headache, fatigue, or decreased appetite at this time.  Her daughter reiterated wishes to continue once a day dosing only, until further discussion with cardiology and neurology.  Patient continues to eat well at home, no recent falls.  She lives in a small apartment attached to her daughter's house.  Her daughter reports that neurology was to have placed home health referral, but home health has not received this yet.  She reports she has followed up with neurology, but no order in place.  Requests PCP to place order at visit today with [facility], who they have used in past.  Wishes PT/OT to work on strengthening and fall prevention.  Discussed with patient and daughter discussing with [facility] their [employer] [facility], which may benefit patient with monthly provider visits.  Her daughter is going to discuss this program with [facility].  RIGHT UPPER BACK PAIN: Daughter reports patient rolled over in bed two days ago and woke-up with pain to right upper back, below scapula.  Denies SOB or decreased mobility.  States tenderness to area when her bra hits it.  No bruising or pain with breathing.  Has been taking Tylenol at home as needed.  Patient states pain is not consistent and mild, dull.  Current home regimen is helping pain.  ADVANCED CARE PLANNING: A voluntary discussion about advance care planning including the explanation and discussion of advance directives was  extensively discussed  with the patient and her daughter for 10 minutes with patient and daughter present.  Explanation about the health care proxy and Living will was reviewed and packet with forms with explanation of how to fill them out was given.  During this discussion, the patient was able to identify a health care proxy as her daughter and plans to fill out the paperwork required in upcoming months.  Patient was offered a separate [place] visit for further assistance with forms.  Also provided a copy of Hard  Choices For Loving People by Ted Mcalpine to patient's daughter for them to read and review.  Relevant past medical, surgical, family and social history reviewed and updated as indicated. Interim medical history since our last visit reviewed. Allergies and medications reviewed and updated.  Review of Systems  Constitutional: Negative for activity change, appetite change, diaphoresis, fatigue and fever.  Respiratory: Negative for cough, chest tightness, shortness of breath and wheezing.   Cardiovascular: Negative for chest pain, palpitations and leg swelling.  Gastrointestinal: Negative for abdominal distention, abdominal pain, constipation, diarrhea, nausea and vomiting.  Endocrine: Negative for cold intolerance, heat intolerance, polydipsia, polyphagia and polyuria.  Neurological: Negative for dizziness, syncope, weakness, light-headedness, numbness and headaches.  Psychiatric/Behavioral: Negative.     Per HPI unless specifically indicated above     Objective:    BP (!) 122/58 (BP Location: Left Arm, Patient Position: Sitting)   Pulse 70   Temp 97.6 F (36.4 C) (Oral)   Ht 5\' 1"  (1.549 m)   Wt 174 lb (78.9 kg)   LMP  (LMP Unknown)   SpO2 94%   BMI 32.88 kg/m   Wt Readings from Last 3 Encounters:  09/03/18 174 lb (78.9 kg)  06/03/18 176 lb 9.6 oz (80.1 kg)  03/20/18 175 lb (79.4 kg)    Physical Exam Vitals signs and nursing note reviewed.  Constitutional:      General: She is awake.     Appearance: She is well-developed.  HENT:     Head: Normocephalic.     Right Ear: Hearing normal.     Left Ear: Hearing normal.     Nose: Nose normal.     Mouth/Throat:     Mouth: Mucous membranes are moist.  Eyes:     General: Lids are normal.        Right eye: No discharge.        Left eye: No discharge.     Conjunctiva/sclera: Conjunctivae normal.     Pupils: Pupils are equal, round, and reactive to light.  Neck:     Musculoskeletal: Normal range of motion and neck supple.      Thyroid: No thyromegaly.     Vascular: No carotid bruit or JVD.  Cardiovascular:     Rate and Rhythm: Normal rate and regular rhythm.     Heart sounds: Normal heart sounds.  Pulmonary:     Effort: Pulmonary effort is normal.     Breath sounds: Normal breath sounds.  Abdominal:     General: Bowel sounds are normal.     Palpations: Abdomen is soft. There is no hepatomegaly or splenomegaly.  Musculoskeletal:       Arms:     Right lower leg: No edema.     Left lower leg: No edema.  Lymphadenopathy:     Cervical: No cervical adenopathy.  Skin:    General: Skin is warm and dry.     Comments: Scattered bruises bilateral upper extremities in various  stages of healing.  Neurological:     Mental Status: She is alert and oriented to person, place, and time.  Psychiatric:        Attention and Perception: Attention normal.        Mood and Affect: Mood normal.        Behavior: Behavior normal. Behavior is cooperative.        Thought Content: Thought content normal.        Judgment: Judgment normal.     Results for orders placed or performed in visit on 06/18/18  [facility] [place]  Result Value Ref Range   Date Time Interrogation Session [id number]    Pulse Generator Manufacturer MERM    Pulse Gen Model [id number] Evera XT DR    Pulse Gen Serial Number [id number]    Clinic Name [facility]    Implantable Pulse Generator Type Implantable Cardiac Defibulator    Implantable Pulse Generator Implant Date [id number]    Implantable Lead Manufacturer MERM    Implantable Lead Model 5076 CapSureFix Novus    Implantable Lead Serial Number [id number]    Implantable Lead Implant Date [id number]    Implantable Lead Location Detail 1 APPENDAGE    Implantable Lead Location [id number]    Implantable Lead Manufacturer [facility]    Implantable Lead Model [phone] Sprint Fidelis    Implantable Lead Serial Number [id number]    Implantable Lead Implant Date [id number]    Implantable Lead Location  Detail 1 APEX    Implantable Lead Location [id number]    Implantable Lead Manufacturer MERM    Implantable Lead Model 5092 CapSure SP Novus    Implantable Lead Serial Number [id number]    Implantable Lead Implant Date [id number]    Implantable Lead Location [phone]    Lead Channel Setting Sensing Sensitivity 0.3 mV   Lead Channel Setting Pacing Pulse Width 0.4 ms   Lead Channel Setting Pacing Amplitude 2.5 V   Lead Channel Impedance Value 399 ohm   Lead Channel Sensing Intrinsic Amplitude 0.75 mV   Lead Channel Sensing Intrinsic Amplitude 0.375 mV   Lead Channel Impedance Value 437 ohm   Lead Channel Impedance Value 342 ohm   Lead Channel Sensing Intrinsic Amplitude 1.25 mV   Lead Channel Sensing Intrinsic Amplitude 1.25 mV   Lead Channel Pacing Threshold Amplitude 0.625 V   Lead Channel Pacing Threshold Pulse Width 0.4 ms   HighPow Impedance 44 ohm   HighPow Impedance 51 ohm   Battery Status OK    Battery Remaining Longevity 115 mo   Battery Voltage 3.01 V   Brady Statistic RA Percent Paced 0 %   Brady Statistic RV Percent Paced 11.80 %   Brady Statistic AP VP Percent 0 %   Brady Statistic AS VP Percent 0 %   Brady Statistic AP VS Percent 0 %   Brady Statistic AS VS Percent 0 %   Eval Rhythm AF/Vs/Vp       Assessment & Plan:   Problem List Items Addressed This Visit      Cardiovascular and Mediastinum   ATRIAL FIBRILLATION    Continue current med regimen and collaboration with cardiology.      Relevant Medications   rivaroxaban (XARELTO) 20 MG TABS tablet   rosuvastatin (CRESTOR) 10 MG tablet   furosemide (LASIX) 40 MG tablet   amLODipine (NORVASC) 2.5 MG tablet   Chronic systolic heart failure (HCC)    Continue to collaborate with cardiology.      Relevant Medications  rivaroxaban (XARELTO) 20 MG TABS tablet   rosuvastatin (CRESTOR) 10 MG tablet   furosemide (LASIX) 40 MG tablet   amLODipine (NORVASC) 2.5 MG tablet   Essential hypertension, benign    Chronic,  stable on recheck today.  Continue current med regimen.  Avoid hypotension d/t risk for falls.  Continue to collaborate with cardiology.      Relevant Medications   rivaroxaban (XARELTO) 20 MG TABS tablet   rosuvastatin (CRESTOR) 10 MG tablet   furosemide (LASIX) 40 MG tablet   amLODipine (NORVASC) 2.5 MG tablet     Nervous and Auditory   Dementia (HCC) - Primary    Chronic, progressive.  Script for changes in Galantamine sent to pharmacy, had at length discussion with daughter about medication and she continues to request once a day dosing.  Length discussion on goals of care, will continue these discussions.  Home Health consult to work on strengthening and fall prevention.      Relevant Medications   galantamine (RAZADYNE) 4 MG tablet   sertraline (ZOLOFT) 50 MG tablet   Other Relevant Orders   Ambulatory referral to [place]     Other   Hypercholesterolemia   Relevant Medications   rivaroxaban (XARELTO) 20 MG TABS tablet   rosuvastatin (CRESTOR) 10 MG tablet   furosemide (LASIX) 40 MG tablet   amLODipine (NORVASC) 2.5 MG tablet   Advanced care planning/counseling discussion    Will continue ongoing discussions.      Upper back pain on right side    Appears MSK in presentation.  Recommend Icy/Hot Lidocaine patches at home.  Continue simple treatment at home.  If worsening or continued pain return to office.         Time: 25 minutes, >50% spent counseling/or care coordination with advanced care discussion, medication education, and home health discussion  Follow up plan: Return in about 2 months (around 11/02/2018) for Dementia.

## 2018-09-03 NOTE — Patient Instructions (Signed)
Dementia  Dementia is the loss of two or more brain functions, such as:  · Memory.  · Decision making.  · Behavior.  · Speaking.  · Thinking.  · Problem solving.  There are many types of dementia. The most common type is called progressive dementia. Progressive dementia gets worse with time and it is irreversible. An example of this type of dementia is Alzheimer disease.  What are the causes?  This condition may be caused by:  · Nerve cell damage in the brain.  · Genetic mutations.  · Certain medicines.  · Multiple small strokes.  · An infection, such as chronic meningitis.  · A metabolic problem, such as vitamin B12 deficiency or thyroid disease.  · Pressure on the brain, such as from a tumor or blood clot.  What are the signs or symptoms?  Symptoms of this condition include:  · Sudden changes in mood.  · Depression.  · Problems with balance.  · Changes in personality.  · Poor short-term memory.  · Agitation.  · Delusions.  · Hallucinations.  · Having a hard time:  ? Speaking thoughts.  ? Finding words.  ? Solving problems.  ? Doing familiar tasks.  ? Understanding familiar ideas.  How is this diagnosed?  This condition is diagnosed with an assessment by your health care provider. During this assessment, your health care provider will talk with you and your family, friends, or caregivers about your symptoms.  A thorough medical history will be taken, and you will have a physical exam and tests. Tests may include:  · Lab tests, such as blood or urine tests.  · Imaging tests, such as a CT scan, PET scan, or MRI.  · A lumbar puncture. This test involves removing and testing a small amount of the fluid that surrounds the brain and spinal cord.  · An electroencephalogram (EEG). In this test, small metal discs are used to measure electrical activity in the brain.  · Memory tests, cognitive tests, and neuropsychological tests. These tests evaluate brain function.  How is this treated?  Treatment depends on the cause of  the dementia. It may involve taking medicines that may help:  · To control the dementia.  · To slow down the disease.  · To manage symptoms.  In some cases, treating the cause of the dementia can improve symptoms, reverse symptoms, or slow down how quickly the dementia gets worse.  Your health care provider can help direct you to support groups, organizations, and other health care providers who can help with decisions about your care.  Follow these instructions at home:  Medicine  · Take over-the-counter and prescription medicines only as told by your health care provider.  · Avoid taking medicines that can affect thinking, such as pain or sleeping medicines.  Lifestyle    · Make healthy lifestyle choices:  ? Be physically active as told by your health care provider.  ? Do not use any tobacco products, such as cigarettes, chewing tobacco, and e-cigarettes. If you need help quitting, ask your health care provider.  ? Eat a healthy diet.  ? Practice stress-management techniques when you get stressed.  ? Stay social.  · Drink enough fluid to keep your urine clear or pale yellow.  · Make sure to get quality sleep. These tips can help you to get a good night's rest:  ? Avoid napping during the day.  ? Keep your sleeping area dark and cool.  ? Avoid exercising during the few   hours before you go to bed.  ? Avoid caffeine products in the evening.  General instructions  · Work with your health care provider to determine what you need help with and what your safety needs are.  · If you were given a bracelet that tracks your location, make sure to wear it.  · Keep all follow-up visits as told by your health care provider. This is important.  Contact a health care provider if:  · You have any new symptoms.  · You have problems with choking or swallowing.  · You have any symptoms of a different illness.  Get help right away if:  · You develop a fever.  · You have new or worsening confusion.  · You have new or worsening  sleepiness.  · You have a hard time staying awake.  · You or your family members become concerned for your safety.  This information is not intended to replace advice given to you by your health care provider. Make sure you discuss any questions you have with your health care provider.  Document Released: 02/07/2001 Document Revised: 12/23/2015 Document Reviewed: 05/12/2015  Elsevier Interactive Patient Education © 2019 Elsevier Inc[employer]

## 2018-09-03 NOTE — Assessment & Plan Note (Signed)
Appears MSK in presentation.  Recommend Icy/Hot Lidocaine patches at home.  Continue simple treatment at home.  If worsening or continued pain return to office.

## 2018-09-03 NOTE — Assessment & Plan Note (Signed)
Chronic, progressive.  Script for changes in Galantamine sent to pharmacy, had at length discussion with daughter about medication and she continues to request once a day dosing.  Length discussion on goals of care, will continue these discussions.  Home Health consult to work on strengthening and fall prevention.

## 2018-09-05 ENCOUNTER — Ambulatory Visit: Payer: Medicare Other | Admitting: Nurse Practitioner

## 2018-09-05 VITALS — BP 134/76 | HR 74 | Ht 61.0 in | Wt 175.0 lb

## 2018-09-05 DIAGNOSIS — I428 Other cardiomyopathies: Secondary | ICD-10-CM | POA: Diagnosis not present

## 2018-09-05 DIAGNOSIS — I10 Essential (primary) hypertension: Secondary | ICD-10-CM | POA: Diagnosis not present

## 2018-09-05 DIAGNOSIS — I5022 Chronic systolic (congestive) heart failure: Secondary | ICD-10-CM

## 2018-09-05 DIAGNOSIS — I4821 Permanent atrial fibrillation: Secondary | ICD-10-CM

## 2018-09-05 LAB — CUP PACEART INCLINIC DEVICE CHECK
Date Time Interrogation Session: 20200109133924
Implantable Lead Implant Date: 20040220
Implantable Lead Implant Date: 20070426
Implantable Lead Implant Date: 20070511
Implantable Lead Location: 753859
Implantable Lead Location: 753860
Implantable Lead Model: 5076
Implantable Lead Model: 5092
Implantable Lead Model: 6949
Implantable Pulse Generator Implant Date: 20180411
MDC IDC LEAD LOCATION: 753860

## 2018-09-05 MED ORDER — CARVEDILOL 3.125 MG PO TABS: 3.1250 mg | ORAL_TABLET | Freq: Two times a day (BID) | ORAL | 3 refills | Status: DC

## 2018-09-05 NOTE — Patient Instructions (Signed)
Medication Instructions:  START CARVEDILOL (coreg) 3.125 mg TWICE PER DAY  If you need a refill on your cardiac medications before your next appointment, please call your pharmacy.   Lab work:TODAY CBC MAGNESIUM BMET  If you have labs (blood work) drawn today and your tests are completely normal, you will receive your results only by: [name] MyChart Message (if you have MyChart) OR . A paper copy in the mail If you have any lab test that is abnormal or we need to change your treatment, we will call you to review the results.  Testing/Procedures: NONE  Follow-Up:Dr Caryl Comes in [place], you and your health needs are our priority.  As part of our continuing mission to provide you with exceptional heart care, we have created designated Provider Care Teams.  These Care Teams include your primary Cardiologist (physician) and Advanced Practice Providers (APPs -  Physician Assistants and Nurse Practitioners) who all work together to provide you with the care you need, when you need it. .   Any Other Special Instructions Will Be Listed Below (If Applicable).

## 2018-09-05 NOTE — Progress Notes (Signed)
Electrophysiology Office Note Date: 09/05/2018  ID:  Barbara Blackburn, DOB 1929/03/14, MRN [id number]  PCP: Venita Lick, NP Electrophysiologist: Caryl Comes  CC: Routine ICD follow-up  Barbara Blackburn is a 85 y.o. female seen today for Dr Caryl Comes.  She presents today for routine electrophysiology followup.  Since last being seen in our clinic, the patient reports doing reasonably well.  She had ICD shock 08/23/18 of which she was unaware. Initiated with long-short sequence.  She denies chest pain, palpitations, dyspnea (above baseline), PND, orthopnea, nausea, vomiting, dizziness, syncope, edema, weight gain, or early satiety.  She has not had further ICD shocks.   Device History: MDT dual chamber ICD implanted 2012 for NICM; gen change with capping of P/S portion of 6949 lead 2012 History of appropriate therapy: Yes History of AAD therapy: No   Past Medical History:  Diagnosis Date  . 6949-lead 11/11/2013  . Allergy   . CHF (congestive heart failure) (HCC)    class 2  . Chronic atrial fibrillation   . Coronary artery disease   . Degenerative joint disease    severe  . Dyslipidemia   . Hyperlipidemia   . ICD-Medtronic 05/10/2009   Qualifier: Diagnosis of  By: Lovena Le, MD, [facility], Binnie Kand   . Nonischemic cardiomyopathy [facility])    Past Surgical History:  Procedure Laterality Date  . ICD GENERATOR CHANGEOUT N/A 12/06/2016   Procedure: ICD Generator Changeout;  Surgeon: Deboraha Sprang, MD;  Location: [place] [facility];  Service: Cardiovascular;  Laterality: N/A;  . Medtronic dual-chamber ICD  01/05/2006   generator change April 2012  . PCI stent    . REPLACEMENT TOTAL KNEE      Current Outpatient Medications  Medication Sig Dispense Refill  . amLODipine (NORVASC) 2.5 MG tablet Take 1 tablet (2.5 mg total) by mouth daily. 180 tablet 3  . Cholecalciferol (VITAMIN D) 2000 units tablet Take 2,000 Units by mouth daily.    . diclofenac sodium (VOLTAREN) 1 % GEL Apply topically  as needed.     . ENSURE PLUS (ENSURE PLUS) LIQD Take 237 mLs by mouth.    . furosemide (LASIX) 40 MG tablet TAKE 1 TABLET BY MOUTH  DAILY WITH LUNCH 90 tablet 3  . galantamine (RAZADYNE) 4 MG tablet Take 1 tablet (4 mg total) by mouth daily. 90 tablet 0  . losartan (COZAAR) 50 MG tablet Take 1 tablet (50 mg total) by mouth daily. 90 tablet 3  . Probiotic Product (ALIGN) 4 MG CAPS Take 4 mg by mouth daily.     . rivaroxaban (XARELTO) 20 MG TABS tablet TAKE 1 TABLET BY MOUTH  DAILY WITH SUPPER 90 tablet 3  . rosuvastatin (CRESTOR) 10 MG tablet Take 1 tablet (10 mg total) by mouth daily. 90 tablet 0  . sertraline (ZOLOFT) 50 MG tablet Take 1 tablet (50 mg total) by mouth daily. 90 tablet 3  . carvedilol (COREG) 3.125 MG tablet Take 1 tablet (3.125 mg total) by mouth 2 (two) times daily. 180 tablet 3   No current facility-administered medications for this visit.     Allergies:   Clonazepam; Fluoxetine hcl; Lisinopril; and Zolpidem tartrate   Social History: Social History   Socioeconomic History  . Marital status: Widowed    Spouse name: Not on file  . Number of children: Not on file  . Years of education: Not on file  . Highest education level: Not on file  Occupational History  . Not on file  Social Needs  .  Financial resource strain: Not hard at all  . Food insecurity:    Worry: Never true    Inability: Never true  . Transportation needs:    Medical: No    Non-medical: No  Tobacco Use  . Smoking status: Never Smoker  . Smokeless tobacco: Never Used  Substance and Sexual Activity  . Alcohol use: No  . Drug use: No  . Sexual activity: Never  Lifestyle  . Physical activity:    Days per week: 0 days    Minutes per session: 0 min  . Stress: Not at all  Relationships  . Social connections:    Talks on phone: More than three times a week    Gets together: More than three times a week    Attends religious service: Never    Active member of club or organization: No     Attends meetings of clubs or organizations: Never    Relationship status: Widowed  . Intimate partner violence:    Fear of current or ex partner: No    Emotionally abused: No    Physically abused: No    Forced sexual activity: No  Other Topics Concern  . Not on file  Social History Narrative  . Not on file    Family History: Family History  Problem Relation Age of Onset  . Heart attack Father   . Cancer Mother        colon  . Hypertension Sister   . Aneurysm Sister   . Aneurysm Maternal Grandmother   . Cancer Sister        rectal    Review of Systems: All other systems reviewed and are otherwise negative except as noted above.   Physical Exam: VS:  BP 134/76   Pulse 74   Ht 5\' 1"  (1.549 m)   Wt 175 lb (79.4 kg)   LMP  (LMP Unknown)   SpO2 96%   BMI 33.07 kg/m  , BMI Body mass index is 33.07 kg/m.  GEN- The patient is elderly appearing, alert and oriented x 3 today.   HEENT: normocephalic, atraumatic; sclera clear, conjunctiva pink; hearing intact; oropharynx clear; neck supple  Lungs- Clear to ausculation bilaterally, normal work of breathing.  No wheezes, rales, rhonchi Heart- Irregular rate and rhythm  GI- soft, non-tender, non-distended, bowel sounds present  Extremities- no clubbing, cyanosis, or edema  MS- no significant deformity or atrophy Skin- warm and dry, no rash or lesion; ICD pocket well healed Psych- euthymic mood, full affect Neuro- strength and sensation are intact  ICD interrogation- reviewed in detail today,  See PACEART report  EKG:  EKG is not ordered today.  Recent Labs: 01/01/2018: ALT 16; BUN 22; Creatinine, Ser 0.72; Hemoglobin 15.1; Platelets 201; Potassium 3.6; Sodium 139   Wt Readings from Last 3 Encounters:  09/05/18 175 lb (79.4 kg)  09/03/18 174 lb (78.9 kg)  06/03/18 176 lb 9.6 oz (80.1 kg)     Other studies Reviewed: Additional studies/ records that were reviewed today include: Dr Olin Pia office notes   Assessment and  Plan:  1.  Chronic systolic dysfunction euvolemic today Stable on an appropriate medical regimen Normal ICD function See Pace Art report  2.  Ventricular tachycardia With appropriate HV therapy.  VT initiated with long short coupling Base rate increased to 60bpm today to try to prevent Will add low dose Coreg (she previously had orthostasis with metoprolol, we discussed symptoms to be mindful of today) No recent ischemic symptoms Will update labs today  No driving x6 months  3.  Permanent atrial fibrillation Continue Xarelto for CHADS2VASC of 4  4.  HTN Stable No change required today   Current medicines are reviewed at length with the patient today.   The patient does not have concerns regarding her medicines.  The following changes were made today:  none  Labs/ tests ordered today include: BMET, Mg, CBC Orders Placed This Encounter  Procedures  . Basic Metabolic Panel (BMET)  . CBC  . Magnesium  . CUP PACEART INCLINIC DEVICE CHECK     Disposition:   Follow up with [facility], Dr Caryl Comes as scheduled     Signed, Chanetta Marshall, NP 09/05/2018 2:42 PM  [facility] [address] [place] [address] [phone] (office) [phone] (fax)

## 2018-09-06 DIAGNOSIS — M1990 Unspecified osteoarthritis, unspecified site: Secondary | ICD-10-CM | POA: Diagnosis not present

## 2018-09-06 DIAGNOSIS — G301 Alzheimer's disease with late onset: Secondary | ICD-10-CM | POA: Diagnosis not present

## 2018-09-06 DIAGNOSIS — I5022 Chronic systolic (congestive) heart failure: Secondary | ICD-10-CM | POA: Diagnosis not present

## 2018-09-06 DIAGNOSIS — Z9181 History of falling: Secondary | ICD-10-CM | POA: Diagnosis not present

## 2018-09-06 DIAGNOSIS — I495 Sick sinus syndrome: Secondary | ICD-10-CM | POA: Diagnosis not present

## 2018-09-06 DIAGNOSIS — I4891 Unspecified atrial fibrillation: Secondary | ICD-10-CM | POA: Diagnosis not present

## 2018-09-06 DIAGNOSIS — M6281 Muscle weakness (generalized): Secondary | ICD-10-CM | POA: Diagnosis not present

## 2018-09-06 DIAGNOSIS — Z95810 Presence of automatic (implantable) cardiac defibrillator: Secondary | ICD-10-CM | POA: Diagnosis not present

## 2018-09-06 DIAGNOSIS — M810 Age-related osteoporosis without current pathological fracture: Secondary | ICD-10-CM | POA: Diagnosis not present

## 2018-09-06 DIAGNOSIS — I110 Hypertensive heart disease with heart failure: Secondary | ICD-10-CM | POA: Diagnosis not present

## 2018-09-06 DIAGNOSIS — E119 Type 2 diabetes mellitus without complications: Secondary | ICD-10-CM | POA: Diagnosis not present

## 2018-09-06 DIAGNOSIS — Z96652 Presence of left artificial knee joint: Secondary | ICD-10-CM | POA: Diagnosis not present

## 2018-09-06 DIAGNOSIS — Z7901 Long term (current) use of anticoagulants: Secondary | ICD-10-CM | POA: Diagnosis not present

## 2018-09-06 LAB — CBC
Hematocrit: 41.2 % (ref 34.0–46.6)
Hemoglobin: 14.1 g/dL (ref 11.1–15.9)
MCH: 30.3 pg (ref 26.6–33.0)
MCHC: 34.2 g/dL (ref 31.5–35.7)
MCV: 89 fL (ref 79–97)
Platelets: 178 10*3/uL (ref 150–450)
RBC: 4.65 x10E6/uL (ref 3.77–5.28)
RDW: 13.8 % (ref 11.7–15.4)
WBC: 7.2 10*3/uL (ref 3.4–10.8)

## 2018-09-06 LAB — BASIC METABOLIC PANEL
BUN/Creatinine Ratio: 20 (ref 12–28)
BUN: 16 mg/dL (ref 8–27)
CO2: 24 mmol/L (ref 20–29)
Calcium: 9.2 mg/dL (ref 8.7–10.3)
Chloride: 104 mmol/L (ref 96–106)
Creatinine, Ser: 0.81 mg/dL (ref 0.57–1.00)
GFR calc Af Amer: 76 mL/min/{1.73_m2} (ref >59)
GFR calc non Af Amer: 66 mL/min/{1.73_m2} (ref >59)
Glucose: 100 mg/dL — ABNORMAL HIGH (ref 65–99)
Potassium: 4 mmol/L (ref 3.5–5.2)
SODIUM: 144 mmol/L (ref 134–144)

## 2018-09-06 LAB — MAGNESIUM: Magnesium: 2.2 mg/dL (ref 1.6–2.3)

## 2018-09-09 DIAGNOSIS — M810 Age-related osteoporosis without current pathological fracture: Secondary | ICD-10-CM | POA: Diagnosis not present

## 2018-09-09 DIAGNOSIS — I4891 Unspecified atrial fibrillation: Secondary | ICD-10-CM | POA: Diagnosis not present

## 2018-09-09 DIAGNOSIS — I5022 Chronic systolic (congestive) heart failure: Secondary | ICD-10-CM | POA: Diagnosis not present

## 2018-09-09 DIAGNOSIS — Z95810 Presence of automatic (implantable) cardiac defibrillator: Secondary | ICD-10-CM | POA: Diagnosis not present

## 2018-09-09 DIAGNOSIS — G301 Alzheimer's disease with late onset: Secondary | ICD-10-CM | POA: Diagnosis not present

## 2018-09-09 DIAGNOSIS — Z96652 Presence of left artificial knee joint: Secondary | ICD-10-CM | POA: Diagnosis not present

## 2018-09-09 DIAGNOSIS — I110 Hypertensive heart disease with heart failure: Secondary | ICD-10-CM | POA: Diagnosis not present

## 2018-09-09 DIAGNOSIS — I495 Sick sinus syndrome: Secondary | ICD-10-CM | POA: Diagnosis not present

## 2018-09-09 DIAGNOSIS — M1990 Unspecified osteoarthritis, unspecified site: Secondary | ICD-10-CM | POA: Diagnosis not present

## 2018-09-09 DIAGNOSIS — Z9181 History of falling: Secondary | ICD-10-CM | POA: Diagnosis not present

## 2018-09-09 DIAGNOSIS — Z7901 Long term (current) use of anticoagulants: Secondary | ICD-10-CM | POA: Diagnosis not present

## 2018-09-09 DIAGNOSIS — M6281 Muscle weakness (generalized): Secondary | ICD-10-CM | POA: Diagnosis not present

## 2018-09-09 DIAGNOSIS — E119 Type 2 diabetes mellitus without complications: Secondary | ICD-10-CM | POA: Diagnosis not present

## 2018-09-10 ENCOUNTER — Telehealth: Payer: Self-pay

## 2018-09-10 DIAGNOSIS — I4891 Unspecified atrial fibrillation: Secondary | ICD-10-CM | POA: Diagnosis not present

## 2018-09-10 DIAGNOSIS — Z96652 Presence of left artificial knee joint: Secondary | ICD-10-CM | POA: Diagnosis not present

## 2018-09-10 DIAGNOSIS — Z95810 Presence of automatic (implantable) cardiac defibrillator: Secondary | ICD-10-CM | POA: Diagnosis not present

## 2018-09-10 DIAGNOSIS — G301 Alzheimer's disease with late onset: Secondary | ICD-10-CM | POA: Diagnosis not present

## 2018-09-10 DIAGNOSIS — M6281 Muscle weakness (generalized): Secondary | ICD-10-CM | POA: Diagnosis not present

## 2018-09-10 DIAGNOSIS — I110 Hypertensive heart disease with heart failure: Secondary | ICD-10-CM | POA: Diagnosis not present

## 2018-09-10 DIAGNOSIS — Z9181 History of falling: Secondary | ICD-10-CM | POA: Diagnosis not present

## 2018-09-10 DIAGNOSIS — I5022 Chronic systolic (congestive) heart failure: Secondary | ICD-10-CM | POA: Diagnosis not present

## 2018-09-10 DIAGNOSIS — M1990 Unspecified osteoarthritis, unspecified site: Secondary | ICD-10-CM | POA: Diagnosis not present

## 2018-09-10 DIAGNOSIS — M810 Age-related osteoporosis without current pathological fracture: Secondary | ICD-10-CM | POA: Diagnosis not present

## 2018-09-10 DIAGNOSIS — E119 Type 2 diabetes mellitus without complications: Secondary | ICD-10-CM | POA: Diagnosis not present

## 2018-09-10 DIAGNOSIS — M1711 Unilateral primary osteoarthritis, right knee: Secondary | ICD-10-CM | POA: Diagnosis not present

## 2018-09-10 DIAGNOSIS — I495 Sick sinus syndrome: Secondary | ICD-10-CM | POA: Diagnosis not present

## 2018-09-10 DIAGNOSIS — Z7901 Long term (current) use of anticoagulants: Secondary | ICD-10-CM | POA: Diagnosis not present

## 2018-09-10 NOTE — Telephone Encounter (Signed)
-----   Message from Patsey Berthold, NP sent at 09/10/2018  5:46 AM EST ----- Please notify patient of stable labs. Thanks!

## 2018-09-10 NOTE — Telephone Encounter (Signed)
Notes recorded by Frederik Schmidt, RN on 09/10/2018 at 8:38 AM EST The patient's daughter has been notified of the result and verbalized understanding. All questions (if any) were answered. Frederik Schmidt, RN 09/10/2018 8:38 AM

## 2018-09-11 DIAGNOSIS — Z7901 Long term (current) use of anticoagulants: Secondary | ICD-10-CM | POA: Diagnosis not present

## 2018-09-11 DIAGNOSIS — E119 Type 2 diabetes mellitus without complications: Secondary | ICD-10-CM | POA: Diagnosis not present

## 2018-09-11 DIAGNOSIS — I4891 Unspecified atrial fibrillation: Secondary | ICD-10-CM | POA: Diagnosis not present

## 2018-09-11 DIAGNOSIS — M810 Age-related osteoporosis without current pathological fracture: Secondary | ICD-10-CM | POA: Diagnosis not present

## 2018-09-11 DIAGNOSIS — I495 Sick sinus syndrome: Secondary | ICD-10-CM | POA: Diagnosis not present

## 2018-09-11 DIAGNOSIS — M1990 Unspecified osteoarthritis, unspecified site: Secondary | ICD-10-CM | POA: Diagnosis not present

## 2018-09-11 DIAGNOSIS — I110 Hypertensive heart disease with heart failure: Secondary | ICD-10-CM | POA: Diagnosis not present

## 2018-09-11 DIAGNOSIS — Z96652 Presence of left artificial knee joint: Secondary | ICD-10-CM | POA: Diagnosis not present

## 2018-09-11 DIAGNOSIS — Z95810 Presence of automatic (implantable) cardiac defibrillator: Secondary | ICD-10-CM | POA: Diagnosis not present

## 2018-09-11 DIAGNOSIS — M6281 Muscle weakness (generalized): Secondary | ICD-10-CM | POA: Diagnosis not present

## 2018-09-11 DIAGNOSIS — G301 Alzheimer's disease with late onset: Secondary | ICD-10-CM | POA: Diagnosis not present

## 2018-09-11 DIAGNOSIS — I5022 Chronic systolic (congestive) heart failure: Secondary | ICD-10-CM | POA: Diagnosis not present

## 2018-09-11 DIAGNOSIS — Z9181 History of falling: Secondary | ICD-10-CM | POA: Diagnosis not present

## 2018-09-12 DIAGNOSIS — I495 Sick sinus syndrome: Secondary | ICD-10-CM | POA: Diagnosis not present

## 2018-09-12 DIAGNOSIS — M810 Age-related osteoporosis without current pathological fracture: Secondary | ICD-10-CM | POA: Diagnosis not present

## 2018-09-12 DIAGNOSIS — Z95810 Presence of automatic (implantable) cardiac defibrillator: Secondary | ICD-10-CM | POA: Diagnosis not present

## 2018-09-12 DIAGNOSIS — Z96652 Presence of left artificial knee joint: Secondary | ICD-10-CM | POA: Diagnosis not present

## 2018-09-12 DIAGNOSIS — M1990 Unspecified osteoarthritis, unspecified site: Secondary | ICD-10-CM | POA: Diagnosis not present

## 2018-09-12 DIAGNOSIS — I4891 Unspecified atrial fibrillation: Secondary | ICD-10-CM | POA: Diagnosis not present

## 2018-09-12 DIAGNOSIS — I5022 Chronic systolic (congestive) heart failure: Secondary | ICD-10-CM | POA: Diagnosis not present

## 2018-09-12 DIAGNOSIS — I110 Hypertensive heart disease with heart failure: Secondary | ICD-10-CM | POA: Diagnosis not present

## 2018-09-12 DIAGNOSIS — G301 Alzheimer's disease with late onset: Secondary | ICD-10-CM | POA: Diagnosis not present

## 2018-09-12 DIAGNOSIS — M6281 Muscle weakness (generalized): Secondary | ICD-10-CM | POA: Diagnosis not present

## 2018-09-12 DIAGNOSIS — Z9181 History of falling: Secondary | ICD-10-CM | POA: Diagnosis not present

## 2018-09-12 DIAGNOSIS — E119 Type 2 diabetes mellitus without complications: Secondary | ICD-10-CM | POA: Diagnosis not present

## 2018-09-12 DIAGNOSIS — Z7901 Long term (current) use of anticoagulants: Secondary | ICD-10-CM | POA: Diagnosis not present

## 2018-09-13 DIAGNOSIS — Z96652 Presence of left artificial knee joint: Secondary | ICD-10-CM | POA: Diagnosis not present

## 2018-09-13 DIAGNOSIS — Z95810 Presence of automatic (implantable) cardiac defibrillator: Secondary | ICD-10-CM | POA: Diagnosis not present

## 2018-09-13 DIAGNOSIS — I4891 Unspecified atrial fibrillation: Secondary | ICD-10-CM | POA: Diagnosis not present

## 2018-09-13 DIAGNOSIS — M1990 Unspecified osteoarthritis, unspecified site: Secondary | ICD-10-CM | POA: Diagnosis not present

## 2018-09-13 DIAGNOSIS — M6281 Muscle weakness (generalized): Secondary | ICD-10-CM | POA: Diagnosis not present

## 2018-09-13 DIAGNOSIS — M810 Age-related osteoporosis without current pathological fracture: Secondary | ICD-10-CM | POA: Diagnosis not present

## 2018-09-13 DIAGNOSIS — Z9181 History of falling: Secondary | ICD-10-CM | POA: Diagnosis not present

## 2018-09-13 DIAGNOSIS — I110 Hypertensive heart disease with heart failure: Secondary | ICD-10-CM | POA: Diagnosis not present

## 2018-09-13 DIAGNOSIS — Z7901 Long term (current) use of anticoagulants: Secondary | ICD-10-CM | POA: Diagnosis not present

## 2018-09-13 DIAGNOSIS — I495 Sick sinus syndrome: Secondary | ICD-10-CM | POA: Diagnosis not present

## 2018-09-13 DIAGNOSIS — I5022 Chronic systolic (congestive) heart failure: Secondary | ICD-10-CM | POA: Diagnosis not present

## 2018-09-13 DIAGNOSIS — G301 Alzheimer's disease with late onset: Secondary | ICD-10-CM | POA: Diagnosis not present

## 2018-09-13 DIAGNOSIS — E119 Type 2 diabetes mellitus without complications: Secondary | ICD-10-CM | POA: Diagnosis not present

## 2018-09-16 DIAGNOSIS — M6281 Muscle weakness (generalized): Secondary | ICD-10-CM | POA: Diagnosis not present

## 2018-09-16 DIAGNOSIS — Z7901 Long term (current) use of anticoagulants: Secondary | ICD-10-CM | POA: Diagnosis not present

## 2018-09-16 DIAGNOSIS — E119 Type 2 diabetes mellitus without complications: Secondary | ICD-10-CM | POA: Diagnosis not present

## 2018-09-16 DIAGNOSIS — I110 Hypertensive heart disease with heart failure: Secondary | ICD-10-CM | POA: Diagnosis not present

## 2018-09-16 DIAGNOSIS — Z9181 History of falling: Secondary | ICD-10-CM | POA: Diagnosis not present

## 2018-09-16 DIAGNOSIS — I495 Sick sinus syndrome: Secondary | ICD-10-CM | POA: Diagnosis not present

## 2018-09-16 DIAGNOSIS — Z95810 Presence of automatic (implantable) cardiac defibrillator: Secondary | ICD-10-CM | POA: Diagnosis not present

## 2018-09-16 DIAGNOSIS — M810 Age-related osteoporosis without current pathological fracture: Secondary | ICD-10-CM | POA: Diagnosis not present

## 2018-09-16 DIAGNOSIS — I5022 Chronic systolic (congestive) heart failure: Secondary | ICD-10-CM | POA: Diagnosis not present

## 2018-09-16 DIAGNOSIS — G301 Alzheimer's disease with late onset: Secondary | ICD-10-CM | POA: Diagnosis not present

## 2018-09-16 DIAGNOSIS — M1990 Unspecified osteoarthritis, unspecified site: Secondary | ICD-10-CM | POA: Diagnosis not present

## 2018-09-16 DIAGNOSIS — I4891 Unspecified atrial fibrillation: Secondary | ICD-10-CM | POA: Diagnosis not present

## 2018-09-16 DIAGNOSIS — Z96652 Presence of left artificial knee joint: Secondary | ICD-10-CM | POA: Diagnosis not present

## 2018-09-17 ENCOUNTER — Ambulatory Visit (INDEPENDENT_AMBULATORY_CARE_PROVIDER_SITE_OTHER): Payer: Medicare Other

## 2018-09-17 DIAGNOSIS — I4891 Unspecified atrial fibrillation: Secondary | ICD-10-CM | POA: Diagnosis not present

## 2018-09-17 DIAGNOSIS — Z96652 Presence of left artificial knee joint: Secondary | ICD-10-CM | POA: Diagnosis not present

## 2018-09-17 DIAGNOSIS — Z9181 History of falling: Secondary | ICD-10-CM | POA: Diagnosis not present

## 2018-09-17 DIAGNOSIS — I5022 Chronic systolic (congestive) heart failure: Secondary | ICD-10-CM | POA: Diagnosis not present

## 2018-09-17 DIAGNOSIS — E119 Type 2 diabetes mellitus without complications: Secondary | ICD-10-CM | POA: Diagnosis not present

## 2018-09-17 DIAGNOSIS — I495 Sick sinus syndrome: Secondary | ICD-10-CM | POA: Diagnosis not present

## 2018-09-17 DIAGNOSIS — G301 Alzheimer's disease with late onset: Secondary | ICD-10-CM | POA: Diagnosis not present

## 2018-09-17 DIAGNOSIS — Z95810 Presence of automatic (implantable) cardiac defibrillator: Secondary | ICD-10-CM | POA: Diagnosis not present

## 2018-09-17 DIAGNOSIS — M810 Age-related osteoporosis without current pathological fracture: Secondary | ICD-10-CM | POA: Diagnosis not present

## 2018-09-17 DIAGNOSIS — I428 Other cardiomyopathies: Secondary | ICD-10-CM

## 2018-09-17 DIAGNOSIS — I110 Hypertensive heart disease with heart failure: Secondary | ICD-10-CM | POA: Diagnosis not present

## 2018-09-17 DIAGNOSIS — M6281 Muscle weakness (generalized): Secondary | ICD-10-CM | POA: Diagnosis not present

## 2018-09-17 DIAGNOSIS — M1711 Unilateral primary osteoarthritis, right knee: Secondary | ICD-10-CM | POA: Diagnosis not present

## 2018-09-17 DIAGNOSIS — M1990 Unspecified osteoarthritis, unspecified site: Secondary | ICD-10-CM | POA: Diagnosis not present

## 2018-09-17 DIAGNOSIS — Z7901 Long term (current) use of anticoagulants: Secondary | ICD-10-CM | POA: Diagnosis not present

## 2018-09-18 DIAGNOSIS — Z96652 Presence of left artificial knee joint: Secondary | ICD-10-CM | POA: Diagnosis not present

## 2018-09-18 DIAGNOSIS — G301 Alzheimer's disease with late onset: Secondary | ICD-10-CM | POA: Diagnosis not present

## 2018-09-18 DIAGNOSIS — I110 Hypertensive heart disease with heart failure: Secondary | ICD-10-CM | POA: Diagnosis not present

## 2018-09-18 DIAGNOSIS — I495 Sick sinus syndrome: Secondary | ICD-10-CM | POA: Diagnosis not present

## 2018-09-18 DIAGNOSIS — M1990 Unspecified osteoarthritis, unspecified site: Secondary | ICD-10-CM | POA: Diagnosis not present

## 2018-09-18 DIAGNOSIS — Z9181 History of falling: Secondary | ICD-10-CM | POA: Diagnosis not present

## 2018-09-18 DIAGNOSIS — M810 Age-related osteoporosis without current pathological fracture: Secondary | ICD-10-CM | POA: Diagnosis not present

## 2018-09-18 DIAGNOSIS — E119 Type 2 diabetes mellitus without complications: Secondary | ICD-10-CM | POA: Diagnosis not present

## 2018-09-18 DIAGNOSIS — I5022 Chronic systolic (congestive) heart failure: Secondary | ICD-10-CM | POA: Diagnosis not present

## 2018-09-18 DIAGNOSIS — Z7901 Long term (current) use of anticoagulants: Secondary | ICD-10-CM | POA: Diagnosis not present

## 2018-09-18 DIAGNOSIS — I4891 Unspecified atrial fibrillation: Secondary | ICD-10-CM | POA: Diagnosis not present

## 2018-09-18 DIAGNOSIS — Z95810 Presence of automatic (implantable) cardiac defibrillator: Secondary | ICD-10-CM | POA: Diagnosis not present

## 2018-09-18 DIAGNOSIS — M6281 Muscle weakness (generalized): Secondary | ICD-10-CM | POA: Diagnosis not present

## 2018-09-18 NOTE — Progress Notes (Signed)
Remote ICD transmission.   

## 2018-09-19 LAB — CUP PACEART REMOTE DEVICE CHECK
Battery Voltage: 3.01 V
Brady Statistic AP VP Percent: 0 %
Brady Statistic AP VS Percent: 0 %
Brady Statistic AS VP Percent: 0 %
Brady Statistic AS VS Percent: 0 %
Brady Statistic RA Percent Paced: 0 %
Brady Statistic RV Percent Paced: 43.53 %
Date Time Interrogation Session: 20200121083624
HighPow Impedance: 39 Ohm
HighPow Impedance: 45 Ohm
Implantable Lead Implant Date: 20040220
Implantable Lead Implant Date: 20070426
Implantable Lead Implant Date: 20070511
Implantable Lead Location: 753859
Implantable Lead Location: 753860
Implantable Lead Location: 753860
Implantable Lead Model: 5076
Implantable Lead Model: 5092
Implantable Pulse Generator Implant Date: 20180411
Lead Channel Impedance Value: 342 Ohm
Lead Channel Impedance Value: 399 Ohm
Lead Channel Impedance Value: 437 Ohm
Lead Channel Pacing Threshold Amplitude: 1 V
Lead Channel Pacing Threshold Pulse Width: 0.4 ms
Lead Channel Sensing Intrinsic Amplitude: 0.375 mV
Lead Channel Sensing Intrinsic Amplitude: 1 mV
Lead Channel Sensing Intrinsic Amplitude: 1 mV
Lead Channel Setting Pacing Amplitude: 2.5 V
Lead Channel Setting Pacing Pulse Width: 0.4 ms
Lead Channel Setting Sensing Sensitivity: 0.3 mV
MDC IDC MSMT BATTERY REMAINING LONGEVITY: 111 mo
MDC IDC MSMT LEADCHNL RA SENSING INTR AMPL: 0.75 mV

## 2018-09-20 ENCOUNTER — Encounter: Payer: Self-pay | Admitting: Cardiology

## 2018-09-20 DIAGNOSIS — M6281 Muscle weakness (generalized): Secondary | ICD-10-CM | POA: Diagnosis not present

## 2018-09-20 DIAGNOSIS — Z95810 Presence of automatic (implantable) cardiac defibrillator: Secondary | ICD-10-CM | POA: Diagnosis not present

## 2018-09-20 DIAGNOSIS — Z9181 History of falling: Secondary | ICD-10-CM | POA: Diagnosis not present

## 2018-09-20 DIAGNOSIS — E119 Type 2 diabetes mellitus without complications: Secondary | ICD-10-CM | POA: Diagnosis not present

## 2018-09-20 DIAGNOSIS — M1990 Unspecified osteoarthritis, unspecified site: Secondary | ICD-10-CM | POA: Diagnosis not present

## 2018-09-20 DIAGNOSIS — I110 Hypertensive heart disease with heart failure: Secondary | ICD-10-CM | POA: Diagnosis not present

## 2018-09-20 DIAGNOSIS — Z7901 Long term (current) use of anticoagulants: Secondary | ICD-10-CM | POA: Diagnosis not present

## 2018-09-20 DIAGNOSIS — I495 Sick sinus syndrome: Secondary | ICD-10-CM | POA: Diagnosis not present

## 2018-09-20 DIAGNOSIS — I4891 Unspecified atrial fibrillation: Secondary | ICD-10-CM | POA: Diagnosis not present

## 2018-09-20 DIAGNOSIS — M810 Age-related osteoporosis without current pathological fracture: Secondary | ICD-10-CM | POA: Diagnosis not present

## 2018-09-20 DIAGNOSIS — Z96652 Presence of left artificial knee joint: Secondary | ICD-10-CM | POA: Diagnosis not present

## 2018-09-20 DIAGNOSIS — I5022 Chronic systolic (congestive) heart failure: Secondary | ICD-10-CM | POA: Diagnosis not present

## 2018-09-20 DIAGNOSIS — G301 Alzheimer's disease with late onset: Secondary | ICD-10-CM | POA: Diagnosis not present

## 2018-09-23 DIAGNOSIS — I110 Hypertensive heart disease with heart failure: Secondary | ICD-10-CM | POA: Diagnosis not present

## 2018-09-23 DIAGNOSIS — I5022 Chronic systolic (congestive) heart failure: Secondary | ICD-10-CM | POA: Diagnosis not present

## 2018-09-23 DIAGNOSIS — Z96652 Presence of left artificial knee joint: Secondary | ICD-10-CM | POA: Diagnosis not present

## 2018-09-23 DIAGNOSIS — I4891 Unspecified atrial fibrillation: Secondary | ICD-10-CM | POA: Diagnosis not present

## 2018-09-23 DIAGNOSIS — Z95810 Presence of automatic (implantable) cardiac defibrillator: Secondary | ICD-10-CM | POA: Diagnosis not present

## 2018-09-23 DIAGNOSIS — M810 Age-related osteoporosis without current pathological fracture: Secondary | ICD-10-CM | POA: Diagnosis not present

## 2018-09-23 DIAGNOSIS — M1990 Unspecified osteoarthritis, unspecified site: Secondary | ICD-10-CM | POA: Diagnosis not present

## 2018-09-23 DIAGNOSIS — E119 Type 2 diabetes mellitus without complications: Secondary | ICD-10-CM | POA: Diagnosis not present

## 2018-09-23 DIAGNOSIS — Z7901 Long term (current) use of anticoagulants: Secondary | ICD-10-CM | POA: Diagnosis not present

## 2018-09-23 DIAGNOSIS — M6281 Muscle weakness (generalized): Secondary | ICD-10-CM | POA: Diagnosis not present

## 2018-09-23 DIAGNOSIS — Z9181 History of falling: Secondary | ICD-10-CM | POA: Diagnosis not present

## 2018-09-23 DIAGNOSIS — G301 Alzheimer's disease with late onset: Secondary | ICD-10-CM | POA: Diagnosis not present

## 2018-09-23 DIAGNOSIS — I495 Sick sinus syndrome: Secondary | ICD-10-CM | POA: Diagnosis not present

## 2018-09-24 DIAGNOSIS — M1711 Unilateral primary osteoarthritis, right knee: Secondary | ICD-10-CM | POA: Diagnosis not present

## 2018-09-25 DIAGNOSIS — I495 Sick sinus syndrome: Secondary | ICD-10-CM | POA: Diagnosis not present

## 2018-09-25 DIAGNOSIS — E119 Type 2 diabetes mellitus without complications: Secondary | ICD-10-CM | POA: Diagnosis not present

## 2018-09-25 DIAGNOSIS — I110 Hypertensive heart disease with heart failure: Secondary | ICD-10-CM | POA: Diagnosis not present

## 2018-09-25 DIAGNOSIS — G301 Alzheimer's disease with late onset: Secondary | ICD-10-CM | POA: Diagnosis not present

## 2018-09-25 DIAGNOSIS — Z95810 Presence of automatic (implantable) cardiac defibrillator: Secondary | ICD-10-CM | POA: Diagnosis not present

## 2018-09-25 DIAGNOSIS — I4891 Unspecified atrial fibrillation: Secondary | ICD-10-CM | POA: Diagnosis not present

## 2018-09-25 DIAGNOSIS — M810 Age-related osteoporosis without current pathological fracture: Secondary | ICD-10-CM | POA: Diagnosis not present

## 2018-09-25 DIAGNOSIS — M1990 Unspecified osteoarthritis, unspecified site: Secondary | ICD-10-CM | POA: Diagnosis not present

## 2018-09-25 DIAGNOSIS — Z7901 Long term (current) use of anticoagulants: Secondary | ICD-10-CM | POA: Diagnosis not present

## 2018-09-25 DIAGNOSIS — Z9181 History of falling: Secondary | ICD-10-CM | POA: Diagnosis not present

## 2018-09-25 DIAGNOSIS — I5022 Chronic systolic (congestive) heart failure: Secondary | ICD-10-CM | POA: Diagnosis not present

## 2018-09-25 DIAGNOSIS — Z96652 Presence of left artificial knee joint: Secondary | ICD-10-CM | POA: Diagnosis not present

## 2018-09-25 DIAGNOSIS — M6281 Muscle weakness (generalized): Secondary | ICD-10-CM | POA: Diagnosis not present

## 2018-09-26 DIAGNOSIS — I5022 Chronic systolic (congestive) heart failure: Secondary | ICD-10-CM | POA: Diagnosis not present

## 2018-09-26 DIAGNOSIS — Z7901 Long term (current) use of anticoagulants: Secondary | ICD-10-CM | POA: Diagnosis not present

## 2018-09-26 DIAGNOSIS — I110 Hypertensive heart disease with heart failure: Secondary | ICD-10-CM | POA: Diagnosis not present

## 2018-09-26 DIAGNOSIS — M6281 Muscle weakness (generalized): Secondary | ICD-10-CM | POA: Diagnosis not present

## 2018-09-26 DIAGNOSIS — I495 Sick sinus syndrome: Secondary | ICD-10-CM | POA: Diagnosis not present

## 2018-09-26 DIAGNOSIS — E119 Type 2 diabetes mellitus without complications: Secondary | ICD-10-CM | POA: Diagnosis not present

## 2018-09-26 DIAGNOSIS — M1990 Unspecified osteoarthritis, unspecified site: Secondary | ICD-10-CM | POA: Diagnosis not present

## 2018-09-26 DIAGNOSIS — Z96652 Presence of left artificial knee joint: Secondary | ICD-10-CM | POA: Diagnosis not present

## 2018-09-26 DIAGNOSIS — G301 Alzheimer's disease with late onset: Secondary | ICD-10-CM | POA: Diagnosis not present

## 2018-09-26 DIAGNOSIS — Z95810 Presence of automatic (implantable) cardiac defibrillator: Secondary | ICD-10-CM | POA: Diagnosis not present

## 2018-09-26 DIAGNOSIS — I4891 Unspecified atrial fibrillation: Secondary | ICD-10-CM | POA: Diagnosis not present

## 2018-09-26 DIAGNOSIS — Z9181 History of falling: Secondary | ICD-10-CM | POA: Diagnosis not present

## 2018-09-26 DIAGNOSIS — M810 Age-related osteoporosis without current pathological fracture: Secondary | ICD-10-CM | POA: Diagnosis not present

## 2018-09-27 DIAGNOSIS — Z96652 Presence of left artificial knee joint: Secondary | ICD-10-CM | POA: Diagnosis not present

## 2018-09-27 DIAGNOSIS — I5022 Chronic systolic (congestive) heart failure: Secondary | ICD-10-CM | POA: Diagnosis not present

## 2018-09-27 DIAGNOSIS — I110 Hypertensive heart disease with heart failure: Secondary | ICD-10-CM | POA: Diagnosis not present

## 2018-09-27 DIAGNOSIS — Z9181 History of falling: Secondary | ICD-10-CM | POA: Diagnosis not present

## 2018-09-27 DIAGNOSIS — Z95810 Presence of automatic (implantable) cardiac defibrillator: Secondary | ICD-10-CM | POA: Diagnosis not present

## 2018-09-27 DIAGNOSIS — E119 Type 2 diabetes mellitus without complications: Secondary | ICD-10-CM | POA: Diagnosis not present

## 2018-09-27 DIAGNOSIS — I4891 Unspecified atrial fibrillation: Secondary | ICD-10-CM | POA: Diagnosis not present

## 2018-09-27 DIAGNOSIS — I495 Sick sinus syndrome: Secondary | ICD-10-CM | POA: Diagnosis not present

## 2018-09-27 DIAGNOSIS — M1990 Unspecified osteoarthritis, unspecified site: Secondary | ICD-10-CM | POA: Diagnosis not present

## 2018-09-27 DIAGNOSIS — M810 Age-related osteoporosis without current pathological fracture: Secondary | ICD-10-CM | POA: Diagnosis not present

## 2018-09-27 DIAGNOSIS — M6281 Muscle weakness (generalized): Secondary | ICD-10-CM | POA: Diagnosis not present

## 2018-09-27 DIAGNOSIS — Z7901 Long term (current) use of anticoagulants: Secondary | ICD-10-CM | POA: Diagnosis not present

## 2018-09-27 DIAGNOSIS — G301 Alzheimer's disease with late onset: Secondary | ICD-10-CM | POA: Diagnosis not present

## 2018-09-30 DIAGNOSIS — I4891 Unspecified atrial fibrillation: Secondary | ICD-10-CM | POA: Diagnosis not present

## 2018-09-30 DIAGNOSIS — M6281 Muscle weakness (generalized): Secondary | ICD-10-CM | POA: Diagnosis not present

## 2018-09-30 DIAGNOSIS — M810 Age-related osteoporosis without current pathological fracture: Secondary | ICD-10-CM | POA: Diagnosis not present

## 2018-09-30 DIAGNOSIS — I495 Sick sinus syndrome: Secondary | ICD-10-CM | POA: Diagnosis not present

## 2018-09-30 DIAGNOSIS — Z9181 History of falling: Secondary | ICD-10-CM | POA: Diagnosis not present

## 2018-09-30 DIAGNOSIS — Z7901 Long term (current) use of anticoagulants: Secondary | ICD-10-CM | POA: Diagnosis not present

## 2018-09-30 DIAGNOSIS — E119 Type 2 diabetes mellitus without complications: Secondary | ICD-10-CM | POA: Diagnosis not present

## 2018-09-30 DIAGNOSIS — I110 Hypertensive heart disease with heart failure: Secondary | ICD-10-CM | POA: Diagnosis not present

## 2018-09-30 DIAGNOSIS — I5022 Chronic systolic (congestive) heart failure: Secondary | ICD-10-CM | POA: Diagnosis not present

## 2018-09-30 DIAGNOSIS — G301 Alzheimer's disease with late onset: Secondary | ICD-10-CM | POA: Diagnosis not present

## 2018-09-30 DIAGNOSIS — Z96652 Presence of left artificial knee joint: Secondary | ICD-10-CM | POA: Diagnosis not present

## 2018-09-30 DIAGNOSIS — Z95810 Presence of automatic (implantable) cardiac defibrillator: Secondary | ICD-10-CM | POA: Diagnosis not present

## 2018-09-30 DIAGNOSIS — M1990 Unspecified osteoarthritis, unspecified site: Secondary | ICD-10-CM | POA: Diagnosis not present

## 2018-10-01 DIAGNOSIS — Z95810 Presence of automatic (implantable) cardiac defibrillator: Secondary | ICD-10-CM | POA: Diagnosis not present

## 2018-10-01 DIAGNOSIS — G301 Alzheimer's disease with late onset: Secondary | ICD-10-CM | POA: Diagnosis not present

## 2018-10-01 DIAGNOSIS — M810 Age-related osteoporosis without current pathological fracture: Secondary | ICD-10-CM | POA: Diagnosis not present

## 2018-10-01 DIAGNOSIS — M6281 Muscle weakness (generalized): Secondary | ICD-10-CM | POA: Diagnosis not present

## 2018-10-01 DIAGNOSIS — Z7901 Long term (current) use of anticoagulants: Secondary | ICD-10-CM | POA: Diagnosis not present

## 2018-10-01 DIAGNOSIS — E119 Type 2 diabetes mellitus without complications: Secondary | ICD-10-CM | POA: Diagnosis not present

## 2018-10-01 DIAGNOSIS — Z9181 History of falling: Secondary | ICD-10-CM | POA: Diagnosis not present

## 2018-10-01 DIAGNOSIS — I495 Sick sinus syndrome: Secondary | ICD-10-CM | POA: Diagnosis not present

## 2018-10-01 DIAGNOSIS — Z96652 Presence of left artificial knee joint: Secondary | ICD-10-CM | POA: Diagnosis not present

## 2018-10-01 DIAGNOSIS — M1990 Unspecified osteoarthritis, unspecified site: Secondary | ICD-10-CM | POA: Diagnosis not present

## 2018-10-01 DIAGNOSIS — I4891 Unspecified atrial fibrillation: Secondary | ICD-10-CM | POA: Diagnosis not present

## 2018-10-01 DIAGNOSIS — I5022 Chronic systolic (congestive) heart failure: Secondary | ICD-10-CM | POA: Diagnosis not present

## 2018-10-01 DIAGNOSIS — I110 Hypertensive heart disease with heart failure: Secondary | ICD-10-CM | POA: Diagnosis not present

## 2018-10-02 DIAGNOSIS — Z96652 Presence of left artificial knee joint: Secondary | ICD-10-CM | POA: Diagnosis not present

## 2018-10-02 DIAGNOSIS — E119 Type 2 diabetes mellitus without complications: Secondary | ICD-10-CM | POA: Diagnosis not present

## 2018-10-02 DIAGNOSIS — I110 Hypertensive heart disease with heart failure: Secondary | ICD-10-CM | POA: Diagnosis not present

## 2018-10-02 DIAGNOSIS — I4891 Unspecified atrial fibrillation: Secondary | ICD-10-CM | POA: Diagnosis not present

## 2018-10-02 DIAGNOSIS — M810 Age-related osteoporosis without current pathological fracture: Secondary | ICD-10-CM | POA: Diagnosis not present

## 2018-10-02 DIAGNOSIS — Z9181 History of falling: Secondary | ICD-10-CM | POA: Diagnosis not present

## 2018-10-02 DIAGNOSIS — Z7901 Long term (current) use of anticoagulants: Secondary | ICD-10-CM | POA: Diagnosis not present

## 2018-10-02 DIAGNOSIS — G301 Alzheimer's disease with late onset: Secondary | ICD-10-CM | POA: Diagnosis not present

## 2018-10-02 DIAGNOSIS — I5022 Chronic systolic (congestive) heart failure: Secondary | ICD-10-CM | POA: Diagnosis not present

## 2018-10-02 DIAGNOSIS — Z95810 Presence of automatic (implantable) cardiac defibrillator: Secondary | ICD-10-CM | POA: Diagnosis not present

## 2018-10-02 DIAGNOSIS — M1990 Unspecified osteoarthritis, unspecified site: Secondary | ICD-10-CM | POA: Diagnosis not present

## 2018-10-02 DIAGNOSIS — M6281 Muscle weakness (generalized): Secondary | ICD-10-CM | POA: Diagnosis not present

## 2018-10-02 DIAGNOSIS — I495 Sick sinus syndrome: Secondary | ICD-10-CM | POA: Diagnosis not present

## 2018-10-04 DIAGNOSIS — I110 Hypertensive heart disease with heart failure: Secondary | ICD-10-CM | POA: Diagnosis not present

## 2018-10-04 DIAGNOSIS — M6281 Muscle weakness (generalized): Secondary | ICD-10-CM | POA: Diagnosis not present

## 2018-10-04 DIAGNOSIS — M1990 Unspecified osteoarthritis, unspecified site: Secondary | ICD-10-CM | POA: Diagnosis not present

## 2018-10-04 DIAGNOSIS — M810 Age-related osteoporosis without current pathological fracture: Secondary | ICD-10-CM | POA: Diagnosis not present

## 2018-10-04 DIAGNOSIS — Z7901 Long term (current) use of anticoagulants: Secondary | ICD-10-CM | POA: Diagnosis not present

## 2018-10-04 DIAGNOSIS — Z96652 Presence of left artificial knee joint: Secondary | ICD-10-CM | POA: Diagnosis not present

## 2018-10-04 DIAGNOSIS — I4891 Unspecified atrial fibrillation: Secondary | ICD-10-CM | POA: Diagnosis not present

## 2018-10-04 DIAGNOSIS — E119 Type 2 diabetes mellitus without complications: Secondary | ICD-10-CM | POA: Diagnosis not present

## 2018-10-04 DIAGNOSIS — Z9181 History of falling: Secondary | ICD-10-CM | POA: Diagnosis not present

## 2018-10-04 DIAGNOSIS — I5022 Chronic systolic (congestive) heart failure: Secondary | ICD-10-CM | POA: Diagnosis not present

## 2018-10-04 DIAGNOSIS — Z95810 Presence of automatic (implantable) cardiac defibrillator: Secondary | ICD-10-CM | POA: Diagnosis not present

## 2018-10-04 DIAGNOSIS — G301 Alzheimer's disease with late onset: Secondary | ICD-10-CM | POA: Diagnosis not present

## 2018-10-04 DIAGNOSIS — I495 Sick sinus syndrome: Secondary | ICD-10-CM | POA: Diagnosis not present

## 2018-10-07 DIAGNOSIS — M1990 Unspecified osteoarthritis, unspecified site: Secondary | ICD-10-CM | POA: Diagnosis not present

## 2018-10-07 DIAGNOSIS — Z7901 Long term (current) use of anticoagulants: Secondary | ICD-10-CM | POA: Diagnosis not present

## 2018-10-07 DIAGNOSIS — I110 Hypertensive heart disease with heart failure: Secondary | ICD-10-CM | POA: Diagnosis not present

## 2018-10-07 DIAGNOSIS — I5022 Chronic systolic (congestive) heart failure: Secondary | ICD-10-CM | POA: Diagnosis not present

## 2018-10-07 DIAGNOSIS — I495 Sick sinus syndrome: Secondary | ICD-10-CM | POA: Diagnosis not present

## 2018-10-07 DIAGNOSIS — M6281 Muscle weakness (generalized): Secondary | ICD-10-CM | POA: Diagnosis not present

## 2018-10-07 DIAGNOSIS — G301 Alzheimer's disease with late onset: Secondary | ICD-10-CM | POA: Diagnosis not present

## 2018-10-07 DIAGNOSIS — M810 Age-related osteoporosis without current pathological fracture: Secondary | ICD-10-CM | POA: Diagnosis not present

## 2018-10-07 DIAGNOSIS — E119 Type 2 diabetes mellitus without complications: Secondary | ICD-10-CM | POA: Diagnosis not present

## 2018-10-07 DIAGNOSIS — I4891 Unspecified atrial fibrillation: Secondary | ICD-10-CM | POA: Diagnosis not present

## 2018-10-07 DIAGNOSIS — Z95810 Presence of automatic (implantable) cardiac defibrillator: Secondary | ICD-10-CM | POA: Diagnosis not present

## 2018-10-07 DIAGNOSIS — Z96652 Presence of left artificial knee joint: Secondary | ICD-10-CM | POA: Diagnosis not present

## 2018-10-07 DIAGNOSIS — Z9181 History of falling: Secondary | ICD-10-CM | POA: Diagnosis not present

## 2018-10-09 DIAGNOSIS — Z95810 Presence of automatic (implantable) cardiac defibrillator: Secondary | ICD-10-CM | POA: Diagnosis not present

## 2018-10-09 DIAGNOSIS — E119 Type 2 diabetes mellitus without complications: Secondary | ICD-10-CM | POA: Diagnosis not present

## 2018-10-09 DIAGNOSIS — Z7901 Long term (current) use of anticoagulants: Secondary | ICD-10-CM | POA: Diagnosis not present

## 2018-10-09 DIAGNOSIS — Z9181 History of falling: Secondary | ICD-10-CM | POA: Diagnosis not present

## 2018-10-09 DIAGNOSIS — Z96652 Presence of left artificial knee joint: Secondary | ICD-10-CM | POA: Diagnosis not present

## 2018-10-09 DIAGNOSIS — M1990 Unspecified osteoarthritis, unspecified site: Secondary | ICD-10-CM | POA: Diagnosis not present

## 2018-10-09 DIAGNOSIS — I4891 Unspecified atrial fibrillation: Secondary | ICD-10-CM | POA: Diagnosis not present

## 2018-10-09 DIAGNOSIS — M810 Age-related osteoporosis without current pathological fracture: Secondary | ICD-10-CM | POA: Diagnosis not present

## 2018-10-09 DIAGNOSIS — I5022 Chronic systolic (congestive) heart failure: Secondary | ICD-10-CM | POA: Diagnosis not present

## 2018-10-09 DIAGNOSIS — M6281 Muscle weakness (generalized): Secondary | ICD-10-CM | POA: Diagnosis not present

## 2018-10-09 DIAGNOSIS — G301 Alzheimer's disease with late onset: Secondary | ICD-10-CM | POA: Diagnosis not present

## 2018-10-09 DIAGNOSIS — I110 Hypertensive heart disease with heart failure: Secondary | ICD-10-CM | POA: Diagnosis not present

## 2018-10-09 DIAGNOSIS — I495 Sick sinus syndrome: Secondary | ICD-10-CM | POA: Diagnosis not present

## 2018-10-10 DIAGNOSIS — I110 Hypertensive heart disease with heart failure: Secondary | ICD-10-CM | POA: Diagnosis not present

## 2018-10-10 DIAGNOSIS — M6281 Muscle weakness (generalized): Secondary | ICD-10-CM | POA: Diagnosis not present

## 2018-10-10 DIAGNOSIS — I4891 Unspecified atrial fibrillation: Secondary | ICD-10-CM | POA: Diagnosis not present

## 2018-10-10 DIAGNOSIS — Z7901 Long term (current) use of anticoagulants: Secondary | ICD-10-CM | POA: Diagnosis not present

## 2018-10-10 DIAGNOSIS — I495 Sick sinus syndrome: Secondary | ICD-10-CM | POA: Diagnosis not present

## 2018-10-10 DIAGNOSIS — Z96652 Presence of left artificial knee joint: Secondary | ICD-10-CM | POA: Diagnosis not present

## 2018-10-10 DIAGNOSIS — M1990 Unspecified osteoarthritis, unspecified site: Secondary | ICD-10-CM | POA: Diagnosis not present

## 2018-10-10 DIAGNOSIS — G301 Alzheimer's disease with late onset: Secondary | ICD-10-CM | POA: Diagnosis not present

## 2018-10-10 DIAGNOSIS — I5022 Chronic systolic (congestive) heart failure: Secondary | ICD-10-CM | POA: Diagnosis not present

## 2018-10-10 DIAGNOSIS — E119 Type 2 diabetes mellitus without complications: Secondary | ICD-10-CM | POA: Diagnosis not present

## 2018-10-10 DIAGNOSIS — Z9181 History of falling: Secondary | ICD-10-CM | POA: Diagnosis not present

## 2018-10-10 DIAGNOSIS — M810 Age-related osteoporosis without current pathological fracture: Secondary | ICD-10-CM | POA: Diagnosis not present

## 2018-10-10 DIAGNOSIS — Z95810 Presence of automatic (implantable) cardiac defibrillator: Secondary | ICD-10-CM | POA: Diagnosis not present

## 2018-10-11 DIAGNOSIS — I495 Sick sinus syndrome: Secondary | ICD-10-CM | POA: Diagnosis not present

## 2018-10-11 DIAGNOSIS — I110 Hypertensive heart disease with heart failure: Secondary | ICD-10-CM | POA: Diagnosis not present

## 2018-10-11 DIAGNOSIS — Z7901 Long term (current) use of anticoagulants: Secondary | ICD-10-CM | POA: Diagnosis not present

## 2018-10-11 DIAGNOSIS — I4891 Unspecified atrial fibrillation: Secondary | ICD-10-CM | POA: Diagnosis not present

## 2018-10-11 DIAGNOSIS — M1990 Unspecified osteoarthritis, unspecified site: Secondary | ICD-10-CM | POA: Diagnosis not present

## 2018-10-11 DIAGNOSIS — Z95810 Presence of automatic (implantable) cardiac defibrillator: Secondary | ICD-10-CM | POA: Diagnosis not present

## 2018-10-11 DIAGNOSIS — G301 Alzheimer's disease with late onset: Secondary | ICD-10-CM | POA: Diagnosis not present

## 2018-10-11 DIAGNOSIS — Z9181 History of falling: Secondary | ICD-10-CM | POA: Diagnosis not present

## 2018-10-11 DIAGNOSIS — E119 Type 2 diabetes mellitus without complications: Secondary | ICD-10-CM | POA: Diagnosis not present

## 2018-10-11 DIAGNOSIS — Z96652 Presence of left artificial knee joint: Secondary | ICD-10-CM | POA: Diagnosis not present

## 2018-10-11 DIAGNOSIS — M6281 Muscle weakness (generalized): Secondary | ICD-10-CM | POA: Diagnosis not present

## 2018-10-11 DIAGNOSIS — M810 Age-related osteoporosis without current pathological fracture: Secondary | ICD-10-CM | POA: Diagnosis not present

## 2018-10-11 DIAGNOSIS — I5022 Chronic systolic (congestive) heart failure: Secondary | ICD-10-CM | POA: Diagnosis not present

## 2018-10-14 DIAGNOSIS — Z95810 Presence of automatic (implantable) cardiac defibrillator: Secondary | ICD-10-CM | POA: Diagnosis not present

## 2018-10-14 DIAGNOSIS — I495 Sick sinus syndrome: Secondary | ICD-10-CM | POA: Diagnosis not present

## 2018-10-14 DIAGNOSIS — E119 Type 2 diabetes mellitus without complications: Secondary | ICD-10-CM | POA: Diagnosis not present

## 2018-10-14 DIAGNOSIS — M1990 Unspecified osteoarthritis, unspecified site: Secondary | ICD-10-CM | POA: Diagnosis not present

## 2018-10-14 DIAGNOSIS — Z96652 Presence of left artificial knee joint: Secondary | ICD-10-CM | POA: Diagnosis not present

## 2018-10-14 DIAGNOSIS — Z7901 Long term (current) use of anticoagulants: Secondary | ICD-10-CM | POA: Diagnosis not present

## 2018-10-14 DIAGNOSIS — G301 Alzheimer's disease with late onset: Secondary | ICD-10-CM | POA: Diagnosis not present

## 2018-10-14 DIAGNOSIS — I5022 Chronic systolic (congestive) heart failure: Secondary | ICD-10-CM | POA: Diagnosis not present

## 2018-10-14 DIAGNOSIS — M810 Age-related osteoporosis without current pathological fracture: Secondary | ICD-10-CM | POA: Diagnosis not present

## 2018-10-14 DIAGNOSIS — I110 Hypertensive heart disease with heart failure: Secondary | ICD-10-CM | POA: Diagnosis not present

## 2018-10-14 DIAGNOSIS — Z9181 History of falling: Secondary | ICD-10-CM | POA: Diagnosis not present

## 2018-10-14 DIAGNOSIS — M6281 Muscle weakness (generalized): Secondary | ICD-10-CM | POA: Diagnosis not present

## 2018-10-14 DIAGNOSIS — I4891 Unspecified atrial fibrillation: Secondary | ICD-10-CM | POA: Diagnosis not present

## 2018-10-15 ENCOUNTER — Telehealth: Payer: Self-pay | Admitting: Nurse Practitioner

## 2018-10-15 MED ORDER — OMEPRAZOLE 20 MG PO CPDR: 20.0000 mg | DELAYED_RELEASE_CAPSULE | Freq: Every day | ORAL | 0 refills | Status: DC

## 2018-10-15 MED ORDER — CELECOXIB 100 MG PO CAPS: 100.0000 mg | ORAL_CAPSULE | ORAL | 0 refills | Status: DC | PRN

## 2018-10-15 NOTE — Telephone Encounter (Signed)
Spoke to Ms. Dowse's daughter, on [place] list, via telephone to discuss new medication regimen from ortho.  Pharmacist discussed with this provider that Ms. Turpin has script sent in for Celecoxib 200 MG twice a day for 30 days.  Ms. Quijas is also on Xarelto daily.  Concern for GI bleed/interaction.  Ms. Pellegrin daughter agrees with plan of care to change dosing of Celecoxib to 100 MG daily as needed only (only for severe arthritis pain) and initiation of Prilosec for 30 days while taking Celecoxib.  We discussed risks/benefits.  Ms. Miyazaki is reported to be having more knee pain.  Has had gel placed and continues to have discomfort.  Her daughter reports patient continues to have physical therapy.  Discussed changes with pharmacist.

## 2018-10-16 DIAGNOSIS — M6281 Muscle weakness (generalized): Secondary | ICD-10-CM | POA: Diagnosis not present

## 2018-10-16 DIAGNOSIS — Z7901 Long term (current) use of anticoagulants: Secondary | ICD-10-CM | POA: Diagnosis not present

## 2018-10-16 DIAGNOSIS — M1990 Unspecified osteoarthritis, unspecified site: Secondary | ICD-10-CM | POA: Diagnosis not present

## 2018-10-16 DIAGNOSIS — Z9181 History of falling: Secondary | ICD-10-CM | POA: Diagnosis not present

## 2018-10-16 DIAGNOSIS — Z96652 Presence of left artificial knee joint: Secondary | ICD-10-CM | POA: Diagnosis not present

## 2018-10-16 DIAGNOSIS — I495 Sick sinus syndrome: Secondary | ICD-10-CM | POA: Diagnosis not present

## 2018-10-16 DIAGNOSIS — M810 Age-related osteoporosis without current pathological fracture: Secondary | ICD-10-CM | POA: Diagnosis not present

## 2018-10-16 DIAGNOSIS — Z95810 Presence of automatic (implantable) cardiac defibrillator: Secondary | ICD-10-CM | POA: Diagnosis not present

## 2018-10-16 DIAGNOSIS — I4891 Unspecified atrial fibrillation: Secondary | ICD-10-CM | POA: Diagnosis not present

## 2018-10-16 DIAGNOSIS — I5022 Chronic systolic (congestive) heart failure: Secondary | ICD-10-CM | POA: Diagnosis not present

## 2018-10-16 DIAGNOSIS — I110 Hypertensive heart disease with heart failure: Secondary | ICD-10-CM | POA: Diagnosis not present

## 2018-10-16 DIAGNOSIS — G301 Alzheimer's disease with late onset: Secondary | ICD-10-CM | POA: Diagnosis not present

## 2018-10-16 DIAGNOSIS — E119 Type 2 diabetes mellitus without complications: Secondary | ICD-10-CM | POA: Diagnosis not present

## 2018-10-18 DIAGNOSIS — I4891 Unspecified atrial fibrillation: Secondary | ICD-10-CM | POA: Diagnosis not present

## 2018-10-18 DIAGNOSIS — Z95810 Presence of automatic (implantable) cardiac defibrillator: Secondary | ICD-10-CM | POA: Diagnosis not present

## 2018-10-18 DIAGNOSIS — M1990 Unspecified osteoarthritis, unspecified site: Secondary | ICD-10-CM | POA: Diagnosis not present

## 2018-10-18 DIAGNOSIS — I5022 Chronic systolic (congestive) heart failure: Secondary | ICD-10-CM | POA: Diagnosis not present

## 2018-10-18 DIAGNOSIS — M6281 Muscle weakness (generalized): Secondary | ICD-10-CM | POA: Diagnosis not present

## 2018-10-18 DIAGNOSIS — G301 Alzheimer's disease with late onset: Secondary | ICD-10-CM | POA: Diagnosis not present

## 2018-10-18 DIAGNOSIS — Z96652 Presence of left artificial knee joint: Secondary | ICD-10-CM | POA: Diagnosis not present

## 2018-10-18 DIAGNOSIS — I110 Hypertensive heart disease with heart failure: Secondary | ICD-10-CM | POA: Diagnosis not present

## 2018-10-18 DIAGNOSIS — I495 Sick sinus syndrome: Secondary | ICD-10-CM | POA: Diagnosis not present

## 2018-10-18 DIAGNOSIS — Z7901 Long term (current) use of anticoagulants: Secondary | ICD-10-CM | POA: Diagnosis not present

## 2018-10-18 DIAGNOSIS — E119 Type 2 diabetes mellitus without complications: Secondary | ICD-10-CM | POA: Diagnosis not present

## 2018-10-18 DIAGNOSIS — M810 Age-related osteoporosis without current pathological fracture: Secondary | ICD-10-CM | POA: Diagnosis not present

## 2018-10-18 DIAGNOSIS — Z9181 History of falling: Secondary | ICD-10-CM | POA: Diagnosis not present

## 2018-10-21 DIAGNOSIS — G301 Alzheimer's disease with late onset: Secondary | ICD-10-CM | POA: Diagnosis not present

## 2018-10-21 DIAGNOSIS — Z7901 Long term (current) use of anticoagulants: Secondary | ICD-10-CM | POA: Diagnosis not present

## 2018-10-21 DIAGNOSIS — M1990 Unspecified osteoarthritis, unspecified site: Secondary | ICD-10-CM | POA: Diagnosis not present

## 2018-10-21 DIAGNOSIS — E119 Type 2 diabetes mellitus without complications: Secondary | ICD-10-CM | POA: Diagnosis not present

## 2018-10-21 DIAGNOSIS — M810 Age-related osteoporosis without current pathological fracture: Secondary | ICD-10-CM | POA: Diagnosis not present

## 2018-10-21 DIAGNOSIS — Z9181 History of falling: Secondary | ICD-10-CM | POA: Diagnosis not present

## 2018-10-21 DIAGNOSIS — I4891 Unspecified atrial fibrillation: Secondary | ICD-10-CM | POA: Diagnosis not present

## 2018-10-21 DIAGNOSIS — I5022 Chronic systolic (congestive) heart failure: Secondary | ICD-10-CM | POA: Diagnosis not present

## 2018-10-21 DIAGNOSIS — I110 Hypertensive heart disease with heart failure: Secondary | ICD-10-CM | POA: Diagnosis not present

## 2018-10-21 DIAGNOSIS — Z96652 Presence of left artificial knee joint: Secondary | ICD-10-CM | POA: Diagnosis not present

## 2018-10-21 DIAGNOSIS — M6281 Muscle weakness (generalized): Secondary | ICD-10-CM | POA: Diagnosis not present

## 2018-10-21 DIAGNOSIS — I495 Sick sinus syndrome: Secondary | ICD-10-CM | POA: Diagnosis not present

## 2018-10-21 DIAGNOSIS — Z95810 Presence of automatic (implantable) cardiac defibrillator: Secondary | ICD-10-CM | POA: Diagnosis not present

## 2018-10-23 DIAGNOSIS — I110 Hypertensive heart disease with heart failure: Secondary | ICD-10-CM | POA: Diagnosis not present

## 2018-10-23 DIAGNOSIS — M810 Age-related osteoporosis without current pathological fracture: Secondary | ICD-10-CM | POA: Diagnosis not present

## 2018-10-23 DIAGNOSIS — M6281 Muscle weakness (generalized): Secondary | ICD-10-CM | POA: Diagnosis not present

## 2018-10-23 DIAGNOSIS — G301 Alzheimer's disease with late onset: Secondary | ICD-10-CM | POA: Diagnosis not present

## 2018-10-23 DIAGNOSIS — Z9181 History of falling: Secondary | ICD-10-CM | POA: Diagnosis not present

## 2018-10-23 DIAGNOSIS — M1990 Unspecified osteoarthritis, unspecified site: Secondary | ICD-10-CM | POA: Diagnosis not present

## 2018-10-23 DIAGNOSIS — I5022 Chronic systolic (congestive) heart failure: Secondary | ICD-10-CM | POA: Diagnosis not present

## 2018-10-23 DIAGNOSIS — Z95810 Presence of automatic (implantable) cardiac defibrillator: Secondary | ICD-10-CM | POA: Diagnosis not present

## 2018-10-23 DIAGNOSIS — I495 Sick sinus syndrome: Secondary | ICD-10-CM | POA: Diagnosis not present

## 2018-10-23 DIAGNOSIS — Z7901 Long term (current) use of anticoagulants: Secondary | ICD-10-CM | POA: Diagnosis not present

## 2018-10-23 DIAGNOSIS — Z96652 Presence of left artificial knee joint: Secondary | ICD-10-CM | POA: Diagnosis not present

## 2018-10-23 DIAGNOSIS — I4891 Unspecified atrial fibrillation: Secondary | ICD-10-CM | POA: Diagnosis not present

## 2018-10-23 DIAGNOSIS — E119 Type 2 diabetes mellitus without complications: Secondary | ICD-10-CM | POA: Diagnosis not present

## 2018-10-25 DIAGNOSIS — M810 Age-related osteoporosis without current pathological fracture: Secondary | ICD-10-CM | POA: Diagnosis not present

## 2018-10-25 DIAGNOSIS — Z96652 Presence of left artificial knee joint: Secondary | ICD-10-CM | POA: Diagnosis not present

## 2018-10-25 DIAGNOSIS — Z95810 Presence of automatic (implantable) cardiac defibrillator: Secondary | ICD-10-CM | POA: Diagnosis not present

## 2018-10-25 DIAGNOSIS — M1990 Unspecified osteoarthritis, unspecified site: Secondary | ICD-10-CM | POA: Diagnosis not present

## 2018-10-25 DIAGNOSIS — M6281 Muscle weakness (generalized): Secondary | ICD-10-CM | POA: Diagnosis not present

## 2018-10-25 DIAGNOSIS — I495 Sick sinus syndrome: Secondary | ICD-10-CM | POA: Diagnosis not present

## 2018-10-25 DIAGNOSIS — I5022 Chronic systolic (congestive) heart failure: Secondary | ICD-10-CM | POA: Diagnosis not present

## 2018-10-25 DIAGNOSIS — I4891 Unspecified atrial fibrillation: Secondary | ICD-10-CM | POA: Diagnosis not present

## 2018-10-25 DIAGNOSIS — E119 Type 2 diabetes mellitus without complications: Secondary | ICD-10-CM | POA: Diagnosis not present

## 2018-10-25 DIAGNOSIS — Z7901 Long term (current) use of anticoagulants: Secondary | ICD-10-CM | POA: Diagnosis not present

## 2018-10-25 DIAGNOSIS — I110 Hypertensive heart disease with heart failure: Secondary | ICD-10-CM | POA: Diagnosis not present

## 2018-10-25 DIAGNOSIS — Z9181 History of falling: Secondary | ICD-10-CM | POA: Diagnosis not present

## 2018-10-25 DIAGNOSIS — G301 Alzheimer's disease with late onset: Secondary | ICD-10-CM | POA: Diagnosis not present

## 2018-10-28 DIAGNOSIS — Z95810 Presence of automatic (implantable) cardiac defibrillator: Secondary | ICD-10-CM | POA: Diagnosis not present

## 2018-10-28 DIAGNOSIS — Z7901 Long term (current) use of anticoagulants: Secondary | ICD-10-CM | POA: Diagnosis not present

## 2018-10-28 DIAGNOSIS — I5022 Chronic systolic (congestive) heart failure: Secondary | ICD-10-CM | POA: Diagnosis not present

## 2018-10-28 DIAGNOSIS — Z96652 Presence of left artificial knee joint: Secondary | ICD-10-CM | POA: Diagnosis not present

## 2018-10-28 DIAGNOSIS — G301 Alzheimer's disease with late onset: Secondary | ICD-10-CM | POA: Diagnosis not present

## 2018-10-28 DIAGNOSIS — I4891 Unspecified atrial fibrillation: Secondary | ICD-10-CM | POA: Diagnosis not present

## 2018-10-28 DIAGNOSIS — I495 Sick sinus syndrome: Secondary | ICD-10-CM | POA: Diagnosis not present

## 2018-10-28 DIAGNOSIS — M6281 Muscle weakness (generalized): Secondary | ICD-10-CM | POA: Diagnosis not present

## 2018-10-28 DIAGNOSIS — E119 Type 2 diabetes mellitus without complications: Secondary | ICD-10-CM | POA: Diagnosis not present

## 2018-10-28 DIAGNOSIS — Z9181 History of falling: Secondary | ICD-10-CM | POA: Diagnosis not present

## 2018-10-28 DIAGNOSIS — M810 Age-related osteoporosis without current pathological fracture: Secondary | ICD-10-CM | POA: Diagnosis not present

## 2018-10-28 DIAGNOSIS — I110 Hypertensive heart disease with heart failure: Secondary | ICD-10-CM | POA: Diagnosis not present

## 2018-10-28 DIAGNOSIS — M1990 Unspecified osteoarthritis, unspecified site: Secondary | ICD-10-CM | POA: Diagnosis not present

## 2018-10-30 DIAGNOSIS — I5022 Chronic systolic (congestive) heart failure: Secondary | ICD-10-CM | POA: Diagnosis not present

## 2018-10-30 DIAGNOSIS — M810 Age-related osteoporosis without current pathological fracture: Secondary | ICD-10-CM | POA: Diagnosis not present

## 2018-10-30 DIAGNOSIS — M1990 Unspecified osteoarthritis, unspecified site: Secondary | ICD-10-CM | POA: Diagnosis not present

## 2018-10-30 DIAGNOSIS — G301 Alzheimer's disease with late onset: Secondary | ICD-10-CM | POA: Diagnosis not present

## 2018-10-30 DIAGNOSIS — Z95810 Presence of automatic (implantable) cardiac defibrillator: Secondary | ICD-10-CM | POA: Diagnosis not present

## 2018-10-30 DIAGNOSIS — Z96652 Presence of left artificial knee joint: Secondary | ICD-10-CM | POA: Diagnosis not present

## 2018-10-30 DIAGNOSIS — Z9181 History of falling: Secondary | ICD-10-CM | POA: Diagnosis not present

## 2018-10-30 DIAGNOSIS — I4891 Unspecified atrial fibrillation: Secondary | ICD-10-CM | POA: Diagnosis not present

## 2018-10-30 DIAGNOSIS — I110 Hypertensive heart disease with heart failure: Secondary | ICD-10-CM | POA: Diagnosis not present

## 2018-10-30 DIAGNOSIS — E119 Type 2 diabetes mellitus without complications: Secondary | ICD-10-CM | POA: Diagnosis not present

## 2018-10-30 DIAGNOSIS — M6281 Muscle weakness (generalized): Secondary | ICD-10-CM | POA: Diagnosis not present

## 2018-10-30 DIAGNOSIS — Z7901 Long term (current) use of anticoagulants: Secondary | ICD-10-CM | POA: Diagnosis not present

## 2018-10-30 DIAGNOSIS — I495 Sick sinus syndrome: Secondary | ICD-10-CM | POA: Diagnosis not present

## 2018-10-31 DIAGNOSIS — I110 Hypertensive heart disease with heart failure: Secondary | ICD-10-CM | POA: Diagnosis not present

## 2018-10-31 DIAGNOSIS — Z7901 Long term (current) use of anticoagulants: Secondary | ICD-10-CM | POA: Diagnosis not present

## 2018-10-31 DIAGNOSIS — Z9181 History of falling: Secondary | ICD-10-CM | POA: Diagnosis not present

## 2018-10-31 DIAGNOSIS — M6281 Muscle weakness (generalized): Secondary | ICD-10-CM | POA: Diagnosis not present

## 2018-10-31 DIAGNOSIS — I495 Sick sinus syndrome: Secondary | ICD-10-CM | POA: Diagnosis not present

## 2018-10-31 DIAGNOSIS — Z95810 Presence of automatic (implantable) cardiac defibrillator: Secondary | ICD-10-CM | POA: Diagnosis not present

## 2018-10-31 DIAGNOSIS — M1990 Unspecified osteoarthritis, unspecified site: Secondary | ICD-10-CM | POA: Diagnosis not present

## 2018-10-31 DIAGNOSIS — G301 Alzheimer's disease with late onset: Secondary | ICD-10-CM | POA: Diagnosis not present

## 2018-10-31 DIAGNOSIS — M810 Age-related osteoporosis without current pathological fracture: Secondary | ICD-10-CM | POA: Diagnosis not present

## 2018-10-31 DIAGNOSIS — I5022 Chronic systolic (congestive) heart failure: Secondary | ICD-10-CM | POA: Diagnosis not present

## 2018-10-31 DIAGNOSIS — Z96652 Presence of left artificial knee joint: Secondary | ICD-10-CM | POA: Diagnosis not present

## 2018-10-31 DIAGNOSIS — I4891 Unspecified atrial fibrillation: Secondary | ICD-10-CM | POA: Diagnosis not present

## 2018-10-31 DIAGNOSIS — E119 Type 2 diabetes mellitus without complications: Secondary | ICD-10-CM | POA: Diagnosis not present

## 2018-11-06 ENCOUNTER — Encounter: Payer: Self-pay | Admitting: Nurse Practitioner

## 2018-11-06 ENCOUNTER — Other Ambulatory Visit: Payer: Self-pay

## 2018-11-06 ENCOUNTER — Ambulatory Visit (INDEPENDENT_AMBULATORY_CARE_PROVIDER_SITE_OTHER): Payer: Medicare Other | Admitting: Nurse Practitioner

## 2018-11-06 DIAGNOSIS — M19042 Primary osteoarthritis, left hand: Secondary | ICD-10-CM | POA: Diagnosis not present

## 2018-11-06 DIAGNOSIS — M19041 Primary osteoarthritis, right hand: Secondary | ICD-10-CM

## 2018-11-06 DIAGNOSIS — F0280 Dementia in other diseases classified elsewhere without behavioral disturbance: Secondary | ICD-10-CM

## 2018-11-06 DIAGNOSIS — G301 Alzheimer's disease with late onset: Secondary | ICD-10-CM | POA: Diagnosis not present

## 2018-11-06 MED ORDER — GALANTAMINE HYDROBROMIDE 4 MG PO TABS: 4.0000 mg | ORAL_TABLET | Freq: Two times a day (BID) | ORAL | 2 refills | Status: DC

## 2018-11-06 MED ORDER — ALBUTEROL SULFATE (2.5 MG/3ML) 0.083% IN NEBU: 2.5000 mg | INHALATION_SOLUTION | Freq: Four times a day (QID) | RESPIRATORY_TRACT | 1 refills | Status: DC | PRN

## 2018-11-06 MED ORDER — OMEPRAZOLE 20 MG PO CPDR: 20.0000 mg | DELAYED_RELEASE_CAPSULE | Freq: Every day | ORAL | 5 refills | Status: DC

## 2018-11-06 NOTE — Progress Notes (Signed)
BP 133/84    Pulse (!) 52    Temp 98 F (36.7 C) (Oral)    Ht 5\' 1"  (1.549 m)    Wt 171 lb (77.6 kg)    LMP  (LMP Unknown)    SpO2 95%    BMI 32.31 kg/m    Subjective:    Patient ID: Barbara Blackburn, female    DOB: Mar 26, 1929, 85 y.o.   MRN: [id number]  HPI: Barbara Blackburn is a 85 y.o. female  Chief Complaint  Patient presents with   Dementia    f/u   DEMENTIA: Chronic, progressive in nature.  She had initial appointment with Dr. Manuella Ghazi on 08/16/18 and he initiated Galantamine 4 MG BID.  This was switched to once a day at last visit due to daughter concerns, now is taking it twice a day as no further fatigue.   Patient and daughter deny N&V, CP, dizziness, syncope, headache, fatigue, or decreased appetite at this time.   Patient continues to eat well at home per her daughter report, no recent falls.  Has history of fall last year with injury.  She lives in a small apartment attached to her daughter's house.  At last visit had PT/OT referral placed and her daughter reports this helped tremendously in the home with strengthening.  States they just completed their time in the home and daughter is requesting further home health services.  Discussed CCM team with patient and daughter.  OSTEOARTHRITIS RIGHT KNEE: Has ongoing issues with arthritis to right knee.  She is followed by [facility] providers and has had gel placed without success.  They have tried Celecoxib with minimal improvement.  Discussed at length risks of this medication with her daughter and her, as she is also on Xarelto.  They wish no longer to take this.  Wish to continue knee brace, ice, and Voltaren gel.  Her daughter and her would like knee injection.  Discussed performing in clinic today, which they would prefer.   Relevant past medical, surgical, family and social history reviewed and updated as indicated. Interim medical history since our last visit reviewed. Allergies and medications reviewed and  updated.  Review of Systems  Constitutional: Negative for activity change, appetite change, diaphoresis, fatigue and fever.  Respiratory: Negative for cough, chest tightness and shortness of breath.   Cardiovascular: Negative for chest pain, palpitations and leg swelling.  Gastrointestinal: Negative for abdominal distention, abdominal pain, constipation, diarrhea, nausea and vomiting.  Endocrine: Negative for cold intolerance, heat intolerance, polydipsia, polyphagia and polyuria.  Neurological: Negative for dizziness, syncope, weakness, light-headedness, numbness and headaches.  Psychiatric/Behavioral: Negative.     Per HPI unless specifically indicated above     Objective:    BP 133/84    Pulse (!) 52    Temp 98 F (36.7 C) (Oral)    Ht 5\' 1"  (1.549 m)    Wt 171 lb (77.6 kg)    LMP  (LMP Unknown)    SpO2 95%    BMI 32.31 kg/m   Wt Readings from Last 3 Encounters:  11/06/18 171 lb (77.6 kg)  09/05/18 175 lb (79.4 kg)  09/03/18 174 lb (78.9 kg)    Physical Exam Vitals signs and nursing note reviewed.  Constitutional:      General: She is awake.     Appearance: She is well-developed.  HENT:     Head: Normocephalic.  Eyes:     General:        Right eye: No discharge.  Left eye: No discharge.     Conjunctiva/sclera: Conjunctivae normal.     Pupils: Pupils are equal, round, and reactive to light.  Neck:     Musculoskeletal: Normal range of motion and neck supple.     Thyroid: No thyromegaly.     Vascular: No carotid bruit or JVD.  Cardiovascular:     Rate and Rhythm: Normal rate and regular rhythm.     Heart sounds: Normal heart sounds. No murmur. No gallop.   Pulmonary:     Effort: Pulmonary effort is normal.     Breath sounds: Normal breath sounds.  Abdominal:     General: Bowel sounds are normal.     Palpations: Abdomen is soft.  Musculoskeletal:     Right knee: She exhibits decreased range of motion and swelling. She exhibits no effusion and no erythema.  Tenderness found.     Left knee: She exhibits normal range of motion, no swelling, no effusion and no erythema. No tenderness found.     Right lower leg: No edema.     Left lower leg: No edema.  Lymphadenopathy:     Cervical: No cervical adenopathy.  Skin:    General: Skin is warm and dry.  Neurological:     Mental Status: She is alert and oriented to person, place, and time.  Psychiatric:        Mood and Affect: Mood normal.        Behavior: Behavior normal. Behavior is cooperative.        Thought Content: Thought content normal.        Judgment: Judgment normal.    STEROID INJECTION  Procedure: Knee Intraarticular Steroid Injection   Description: After verbal consent and patient ed on right knee injection.  Area prepped and draped using Betadine. semi-sterile technique. Using a anterior approach, a mixture of 4 cc of  1% Marcaine & 1 cc of Kenalog 40 was injected into knee joint.  Pressure applied and bandage was then placed over the injection site.  Complications:  none Post Procedure Instructions: To the ER if any symptoms of erythema or swelling.   Follow Up: PRN  Results for orders placed or performed in visit on 09/17/18  CUP PACEART REMOTE DEVICE CHECK  Result Value Ref Range   Date Time Interrogation Session [id number]    Pulse Generator Manufacturer MERM    Pulse Gen Model [id number] Evera XT DR    Pulse Gen Serial Number [id number] [facility]    Implantable Pulse Generator Type Implantable Cardiac Defibulator    Implantable Pulse Generator Implant Date 50932671    Implantable Lead Manufacturer [facility]    Implantable Lead Model 5076 CapSureFix Novus    Implantable Lead Serial Number [id number]    Implantable Lead Implant Date [id number]    Implantable Lead Location Detail 1 APPENDAGE    Implantable Lead Location [id number]    Implantable Lead Manufacturer [facility]    Implantable Lead Model [id number] Sprint Fidelis    Implantable Lead Serial Number [id number]     Implantable Lead Implant Date [id number]    Implantable Lead Location Detail 1 APEX    Implantable Lead Location [id number]    Implantable Lead Manufacturer [facility]    Implantable Lead Model [id number] CapSure SP Novus    Implantable Lead Serial Number [id number]    Implantable Lead Implant Date [id number]    Implantable Lead Location [id number]    Lead Channel Setting Sensing Sensitivity 0.3 mV   Lead Channel Setting Pacing  Pulse Width 0.4 ms   Lead Channel Setting Pacing Amplitude 2.5 V   Lead Channel Impedance Value 399 ohm   Lead Channel Sensing Intrinsic Amplitude 0.75 mV   Lead Channel Sensing Intrinsic Amplitude 0.375 mV   Lead Channel Impedance Value 437 ohm   Lead Channel Impedance Value 342 ohm   Lead Channel Sensing Intrinsic Amplitude 1 mV   Lead Channel Sensing Intrinsic Amplitude 1 mV   Lead Channel Pacing Threshold Amplitude 1 V   Lead Channel Pacing Threshold Pulse Width 0.4 ms   HighPow Impedance 39 ohm   HighPow Impedance 45 ohm   Battery Status OK    Battery Remaining Longevity 111 mo   Battery Voltage 3.01 V   Brady Statistic RA Percent Paced 0 %   Brady Statistic RV Percent Paced 43.53 %   Brady Statistic AP VP Percent 0 %   Brady Statistic AS VP Percent 0 %   Brady Statistic AP VS Percent 0 %   Brady Statistic AS VS Percent 0 %      Assessment & Plan:   Problem List Items Addressed This Visit      Nervous and Auditory   Dementia (HCC)    Chronic, progressive.  Continue current medication regimen.  CCM referral placed for further assistance to obtain continued therapy in home and any home needs.      Relevant Medications   galantamine (RAZADYNE) 4 MG tablet     Musculoskeletal and Integument   Arthritis, degenerative    Chronic, knee injection performed today.          Follow up plan: Return in about 3 months (around 02/06/2019) for dementia.

## 2018-11-06 NOTE — Assessment & Plan Note (Signed)
Chronic, knee injection performed today.

## 2018-11-06 NOTE — Assessment & Plan Note (Signed)
Chronic, progressive.  Continue current medication regimen.  CCM referral placed for further assistance to obtain continued therapy in home and any home needs.

## 2018-11-06 NOTE — Patient Instructions (Signed)

## 2018-11-07 ENCOUNTER — Telehealth: Payer: Self-pay | Admitting: *Deleted

## 2018-11-07 ENCOUNTER — Encounter: Payer: Self-pay | Admitting: Internal Medicine

## 2018-11-07 ENCOUNTER — Ambulatory Visit (INDEPENDENT_AMBULATORY_CARE_PROVIDER_SITE_OTHER): Payer: Medicare Other | Admitting: Internal Medicine

## 2018-11-07 VITALS — BP 156/70 | HR 90 | Ht 61.0 in | Wt 171.1 lb

## 2018-11-07 DIAGNOSIS — I5022 Chronic systolic (congestive) heart failure: Secondary | ICD-10-CM | POA: Diagnosis not present

## 2018-11-07 DIAGNOSIS — I4821 Permanent atrial fibrillation: Secondary | ICD-10-CM | POA: Diagnosis not present

## 2018-11-07 DIAGNOSIS — I428 Other cardiomyopathies: Secondary | ICD-10-CM | POA: Diagnosis not present

## 2018-11-07 DIAGNOSIS — Z95810 Presence of automatic (implantable) cardiac defibrillator: Secondary | ICD-10-CM | POA: Diagnosis not present

## 2018-11-07 MED ORDER — ISOSORBIDE MONONITRATE ER 30 MG PO TB24: 30.0000 mg | ORAL_TABLET | Freq: Every day | ORAL | 6 refills | Status: DC

## 2018-11-07 MED ORDER — POTASSIUM CHLORIDE ER 10 MEQ PO TBCR: EXTENDED_RELEASE_TABLET | ORAL | 0 refills | Status: DC

## 2018-11-07 NOTE — Progress Notes (Signed)
Patient Care Team: Venita Lick, NP as PCP - General (Nurse Practitioner) Winfield Rast, DC as Referring Physician (Chiropractic Medicine) Deboraha Sprang, MD as Consulting Physician (Cardiology) Minna Merritts, MD as Consulting Physician (Cardiology) Marry Guan, Laurice Record, MD (Orthopedic Surgery)   HPI  Barbara Blackburn is a 85 y.o. female Seen in followup for a dual chamber ICD implanted for syncope in the setting of  Nonischemic and  ischemic cardiomyopathy with prior STENTING  and congestive heart failure. She had  received a defibrillator with a 6949-lead.  At  device generator replacement in 2012; she underwent "pirating" of her rate sense pacing lead and capping of the rate sense portion of her ICD lead  She reached ERI and underwent generator replacement 4/18.  Atrial fibrillation.  Permanent.  Prior stroke.  Anticoagulation with rivaroxaban.   She is seen today following an ICD discharge happened earlier this morning while asleep.  ATP failed ICD discharge terminated V T/VF as well as transiently atrial fibrillation.   Myoview per [facility]) notes 2/14>>EF 50% w inferior scar  DATE TEST    2012        EF 20.25 %   2/14    Myoview   EF 50 %   4/17 Echo EF 60-65%             Date Cr K GFR Hgb  7/18 0.81  60s 15.2  5/19  0.72 3.6  15.1  1/20 0.81 4.0  14.1   The patient denies, nocturnal dyspnea, orthopnea.  There have been no palpitations, lightheadedness or syncope .  The patient has had exertional chest discomfort.  Typically relieved by rest.  Also significant dyspnea on exertion at less than 50 feet.  Denies edema.  Eating less, but not sure if it is early satiety.  Coughing at night but denies orthopnea or nocturnal dyspnea.   Was called this morning following ICD discharge of which she was unaware    Thromboembolic risk factors ( age  -2, HTN-1, TIA/CVA-2, Vasc disease -1, Gender-1) for a CHADSVASc Score of 7    Past Medical History:  Diagnosis  Date  . 6949-lead 11/11/2013  . Allergy   . CHF (congestive heart failure) (HCC)    class 2  . Chronic atrial fibrillation   . Coronary artery disease   . Degenerative joint disease    severe  . Dyslipidemia   . Hyperlipidemia   . ICD-Medtronic 05/10/2009   Qualifier: Diagnosis of  By: Lovena Le, MD, [facility], Binnie Kand   . Nonischemic cardiomyopathy [facility])     Past Surgical History:  Procedure Laterality Date  . ICD GENERATOR CHANGEOUT N/A 12/06/2016   Procedure: ICD Generator Changeout;  Surgeon: Deboraha Sprang, MD;  Location: [place] [facility];  Service: Cardiovascular;  Laterality: N/A;  . Medtronic dual-chamber ICD  01/05/2006   generator change April 2012  . PCI stent    . REPLACEMENT TOTAL KNEE      Current Outpatient Medications  Medication Sig Dispense Refill  . albuterol (PROVENTIL) (2.5 MG/3ML) 0.083% nebulizer solution Take 3 mLs (2.5 mg total) by nebulization every 6 (six) hours as needed for wheezing or shortness of breath. 150 mL 1  . amLODipine (NORVASC) 2.5 MG tablet Take 1 tablet (2.5 mg total) by mouth daily. 180 tablet 3  . celecoxib (CELEBREX) 100 MG capsule Take 1 capsule (100 mg total) by mouth as needed (once daily as needed only for knee pain). 30 capsule  0  . Cholecalciferol (VITAMIN D) 2000 units tablet Take 2,000 Units by mouth daily.    . diclofenac sodium (VOLTAREN) 1 % GEL Apply topically as needed.     . ENSURE PLUS (ENSURE PLUS) LIQD Take 237 mLs by mouth.    . furosemide (LASIX) 40 MG tablet TAKE 1 TABLET BY MOUTH  DAILY WITH LUNCH 90 tablet 3  . galantamine (RAZADYNE) 4 MG tablet Take 1 tablet (4 mg total) by mouth 2 (two) times daily with a meal. 180 tablet 2  . losartan (COZAAR) 50 MG tablet Take 1 tablet (50 mg total) by mouth daily. 90 tablet 3  . omeprazole (PRILOSEC) 20 MG capsule Take 1 capsule (20 mg total) by mouth daily. 30 capsule 5  . rivaroxaban (XARELTO) 20 MG TABS tablet TAKE 1 TABLET BY MOUTH  DAILY WITH SUPPER 90 tablet 3  .  rosuvastatin (CRESTOR) 10 MG tablet Take 1 tablet (10 mg total) by mouth daily. 90 tablet 0  . sertraline (ZOLOFT) 50 MG tablet Take 1 tablet (50 mg total) by mouth daily. 90 tablet 3   No current facility-administered medications for this visit.     Allergies  Allergen Reactions  . Clonazepam     Nightmare   . Fluoxetine Hcl Other (See Comments)    nightmares  . Lisinopril     Cough   . Zolpidem Tartrate Other (See Comments)    Nightmares     Review of Systems negative except from HPI and PMH BP (!) 156/70 (BP Location: Left Arm, Patient Position: Sitting, Cuff Size: Normal)   Pulse 90   Ht 5\' 1"  (1.549 m)   Wt 171 lb 1 oz (77.6 kg)   LMP  (LMP Unknown)   BMI 32.32 kg/m   Physical Exam Well developed and well nourished in no acute distress HENT normal Neck supple with JVP->10 Clear Device pocket well healed; without hematoma or erythema.  There is no tethering  Regular rate and rhythm, no  gallop No murmur Abd-soft with active BS No Clubbing cyanosis 1+ edema Skin-warm and dry A & Oriented  Grossly normal sensory and motor function  ECG atrial fibrillation with intrinsic conduction with some ventricular pacing and occasional PVCs\  Assessment and  Plan  Nonischemic cardiomyopathy  Atrial fibrillation permanent  Congestive heart failure-chronic/acute systolic  Syncope  VT/VF with appropriate interval therapy  Hypertension   Falls  ICD for secondary prevention  The patient's device was interrogated.  The information was reviewed. No changes were made in the programming.     6949-lead with capping of the rate sense portion     The patient had VT VF terminated by shock therapy following failed ATP.  She had another episode 12/19.  Possible triggers include ischemia as there has been some exertional chest discomfort; also OptiVol is significantly elevated and persistently so over the last 2-3 months.  Has had nocturnal cough which might reflect congestive  heart failure and dyspnea on exertion.  We will empirically begin Imdur as an anti-ischemic therapy and increase her diuretics from 40--80.  We will plan a remote transmission to look at OptiVol in the next 14 days.  Check electrolytes today.  On Anticoagulation;  No bleeding issues   Less orthostasis   We spent more than 50% of our >25 min visit in face to face counseling regarding the above

## 2018-11-07 NOTE — Patient Instructions (Addendum)
Medication Instructions:  - Your physician has recommended you make the following change in your medication:   1) INCREASE lasix (furosemide) 40 mg- take 2 tablets (80 mg) by mouth once daily in the morning x 5 days, then resume 1 tablet (40 mg) once daily  2) START potassium 10 meq- take 1 tablet (10 meq) by mouth twice daily x 5 days, then stop  3) START imdur (isosorbide MN) 30 mg- take 1 tablet (30 mg) by mouth once daily  If you need a refill on your cardiac medications before your next appointment, please call your pharmacy.   Lab work: - Your physician recommends that you have lab work today: BMP/ Magnesium  If you have labs (blood work) drawn today and your tests are completely normal, you will receive your results only by: [name] MyChart Message (if you have MyChart) OR . A paper copy in the mail If you have any lab test that is abnormal or we need to change your treatment, we will call you to review the results.  Testing/Procedures: - Your physician has requested that you have an echocardiogram. Echocardiography is a painless test that uses sound waves to create images of your heart. It provides your doctor with information about the size and shape of your heart and how well your heart's chambers and valves are working. This procedure takes approximately one hour. There are no restrictions for this procedure.  Follow-Up: At [facility], you and your health needs are our priority.  As part of our continuing mission to provide you with exceptional heart care, we have created designated Provider Care Teams.  These Care Teams include your primary Cardiologist (physician) and Advanced Practice Providers (APPs -  Physician Assistants and Nurse Practitioners) who all work together to provide you with the care you need, when you need it. . in 3 months with Dr. Caryl Comes.  Any Other Special Instructions Will Be Listed Below (If Applicable). - We will have one of our nurses, Sharman Cheek,  contact you about enrollment in our ICM (implantable cardiac monitoring) clinic.  She will be able to monitor your fluid levels on your device once monthly

## 2018-11-07 NOTE — Telephone Encounter (Signed)
Late documentation due to Epic downtime:   Spoke with Barbara Blackburn [facility]) regarding Carelink alert received for VF episode on 11/07/18 at 03:45, terminated with 25J shock. Appears from EGM that ATP during charge accelerated arrhythmia. Barbara Blackburn is agreeable to an appointment today at 10:15am with Dr. Caryl Comes at the [facility] office.

## 2018-11-08 LAB — BASIC METABOLIC PANEL
BUN/Creatinine Ratio: 29 — ABNORMAL HIGH (ref 12–28)
BUN: 23 mg/dL (ref 8–27)
CHLORIDE: 104 mmol/L (ref 96–106)
CO2: 24 mmol/L (ref 20–29)
Calcium: 10.3 mg/dL (ref 8.7–10.3)
Creatinine, Ser: 0.78 mg/dL (ref 0.57–1.00)
GFR calc non Af Amer: 69 mL/min/{1.73_m2} (ref >59)
GFR, EST AFRICAN AMERICAN: 79 mL/min/{1.73_m2} (ref >59)
Glucose: 131 mg/dL — ABNORMAL HIGH (ref 65–99)
Potassium: 3.9 mmol/L (ref 3.5–5.2)
Sodium: 147 mmol/L — ABNORMAL HIGH (ref 134–144)

## 2018-11-08 LAB — MAGNESIUM: Magnesium: 2.4 mg/dL — ABNORMAL HIGH (ref 1.6–2.3)

## 2018-11-08 IMAGING — CT CT HEAD W/O CM
1 series · 16 of 30 positions shown, 20 images · non-contrast
Comparison: 01/01/2018 head CT at [HOSPITAL]

CLINICAL DATA: Fall with head injury 2 weeks ago sustaining small
left convexity subdural hematoma.

EXAM:
CT HEAD WITHOUT CONTRAST
TECHNIQUE: Contiguous axial images were obtained from the base of the skull
through the vertex without intravenous contrast.

[Series 3: head wo · axial · 0.49mm/px · z∈[+127,+262]mm · 16 of 31 slices shown, 20 images]
[im 2/31  brain]
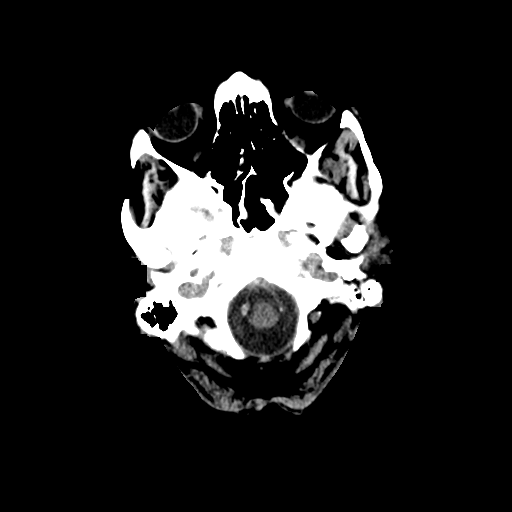
[im 2/31  bone]
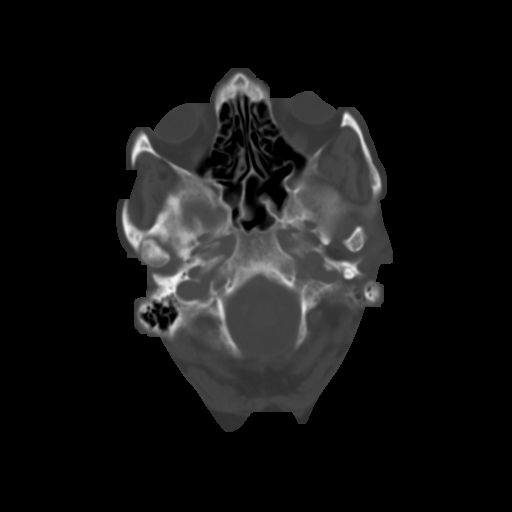
[im 4/31  brain]
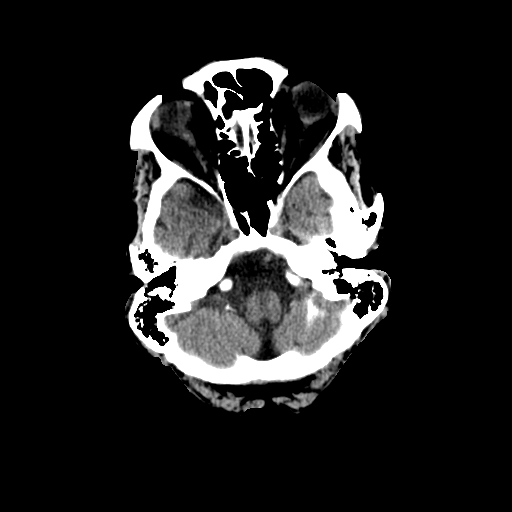
[im 6/31  brain]
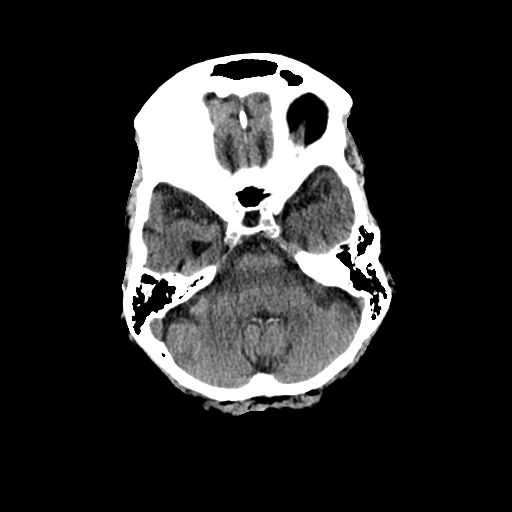
[im 8/31  brain]
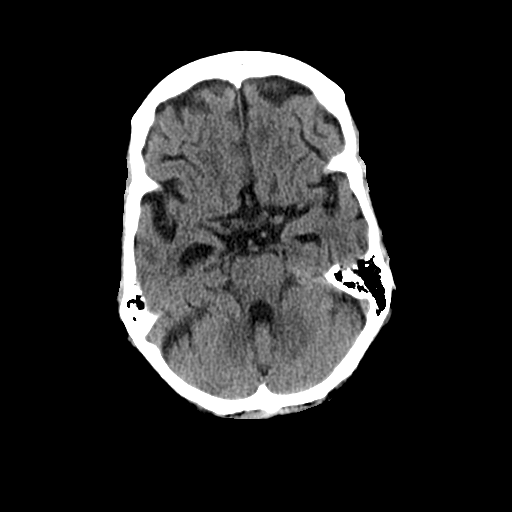
[im 9/31  brain]
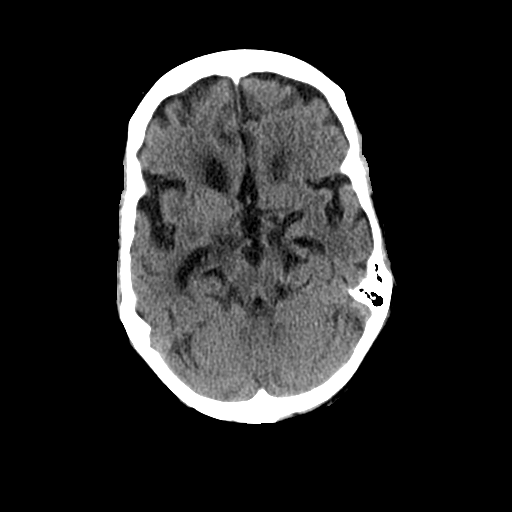
[im 9/31  bone]
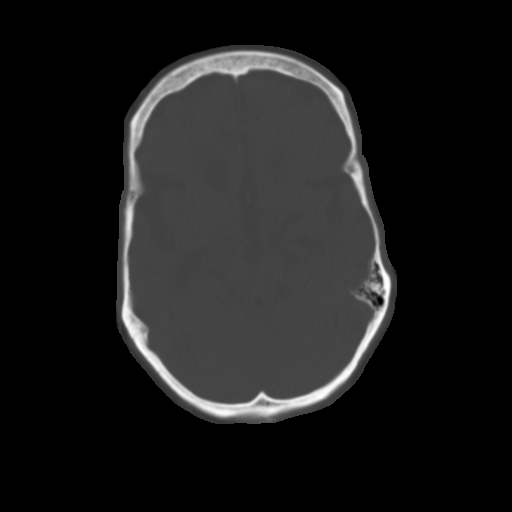
[im 11/31  brain]
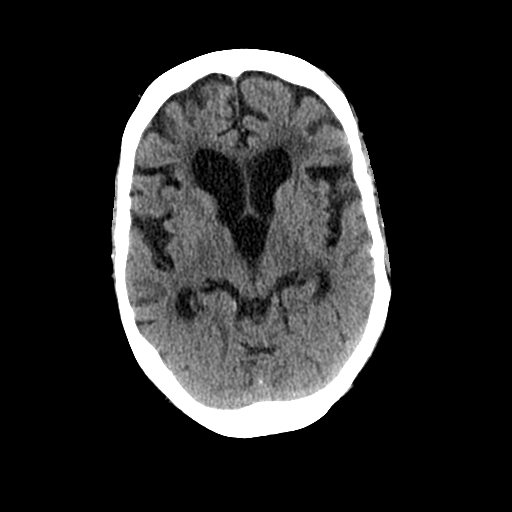
[im 13/31  brain]
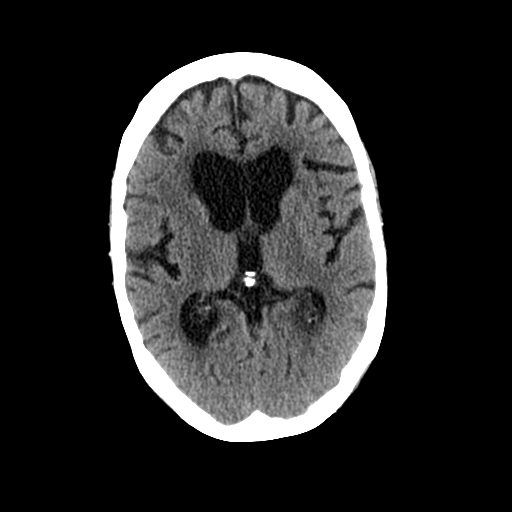
[im 15/31  brain]
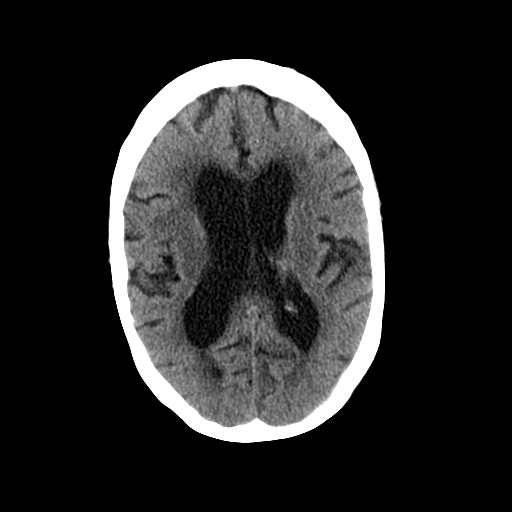
[im 16/31  brain]
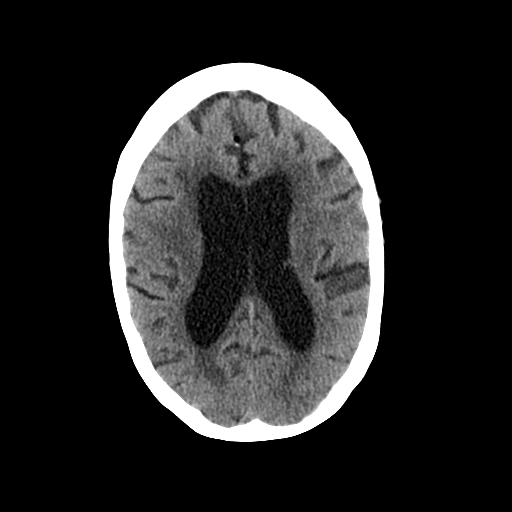
[im 16/31  bone]
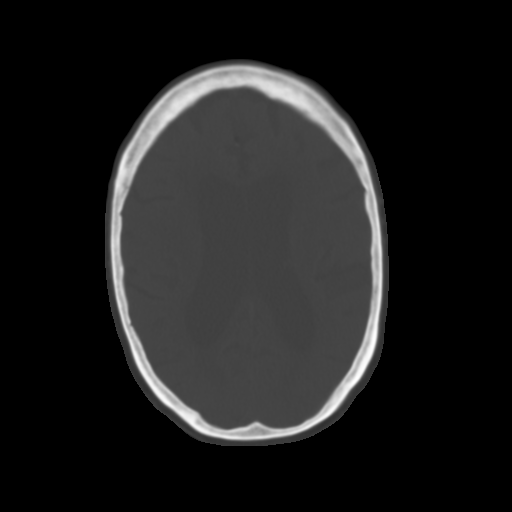
[im 18/31  brain]
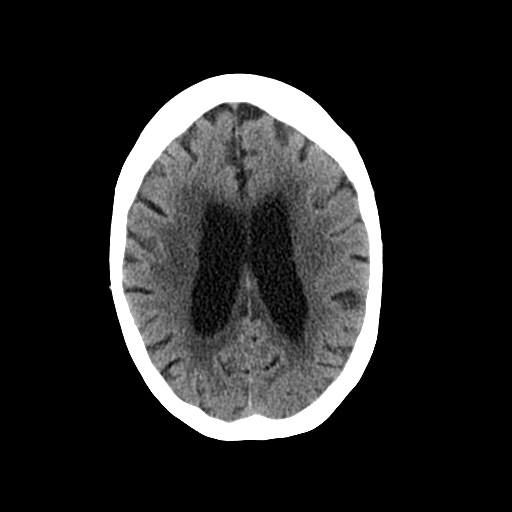
[im 20/31  brain]
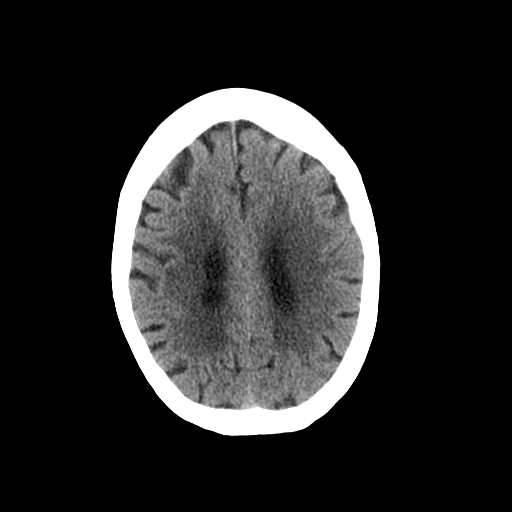
[im 22/31  brain]
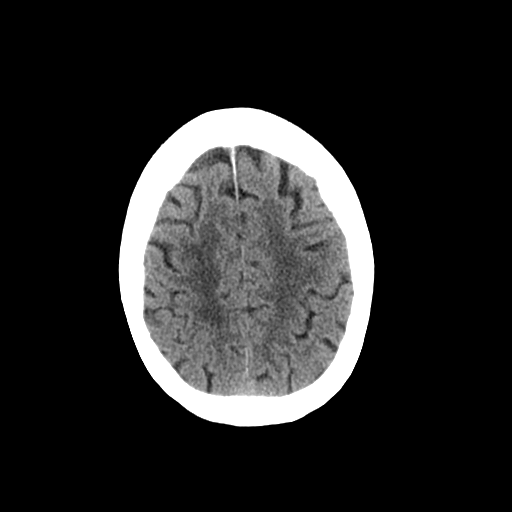
[im 23/31  brain]
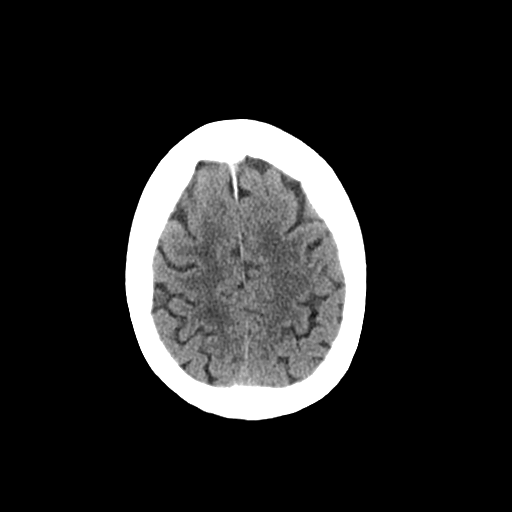
[im 23/31  bone]
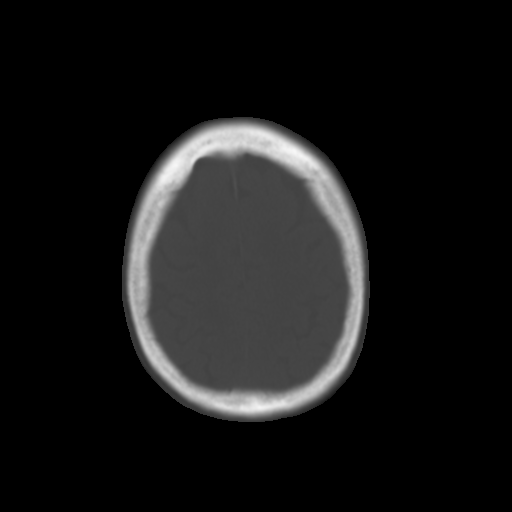
[im 25/31  brain]
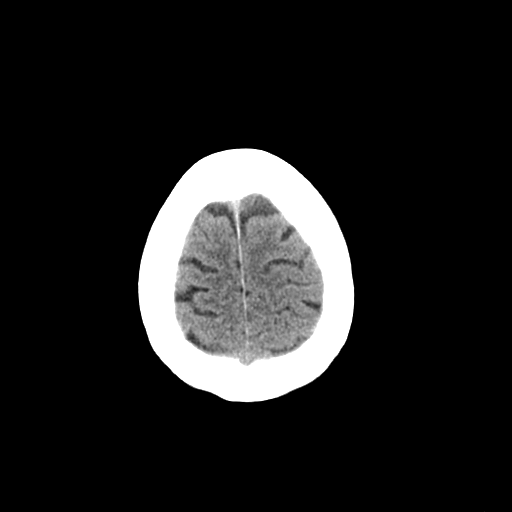
[im 27/31  brain]
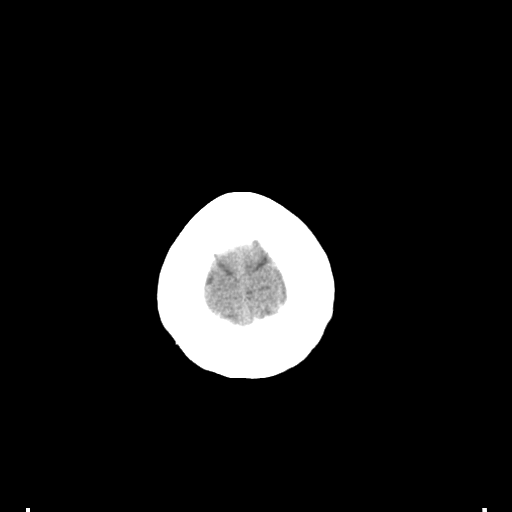
[im 29/31  brain]
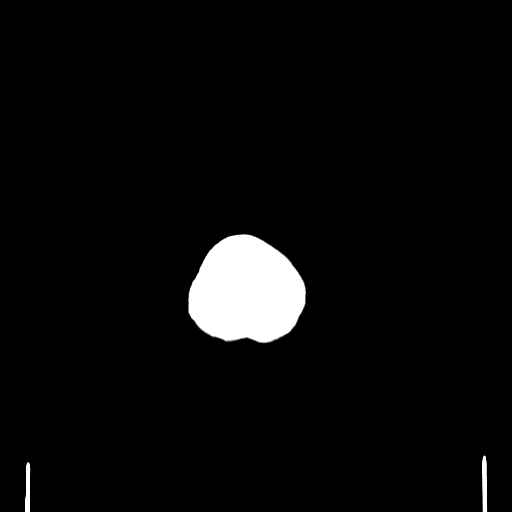

[16 of 30 positions shown; findings below may reference images not displayed]

FINDINGS: Brain: No further visualized subdural hemorrhage along the left
convexity. No hygroma, mass effect or new hemorrhage identified. No
evidence of infarct. Stable advanced small vessel disease, atrophy
and ex vacuo dilatation of the lateral ventricles. No cerebral
infarct identified. No evidence to suggest mass lesion.

Vascular: No hyperdense vessel or unexpected calcification.

Skull: No evidence of skull fracture.

Sinuses/Orbits: The left orbital floor fracture seen previously is
not assessed on the current study. No further blood visualized in
the left upper maxillary antrum.

Other: None.
IMPRESSION: Resolution of small left convexity subdural hemorrhage seen on the
prior head CT.

## 2018-11-11 ENCOUNTER — Other Ambulatory Visit: Payer: Self-pay | Admitting: Nurse Practitioner

## 2018-11-11 ENCOUNTER — Telehealth: Payer: Self-pay

## 2018-11-11 NOTE — Telephone Encounter (Signed)
Received ICM referral from Dr Caryl Comes after office visit 11/07/2018 for device shock.  Call to daughter Barbara Blackburn, DPR, and ICM intro given. She agreed to monthly follow up.  Advised would like to assist her in sending a manual transmission today so the fluid levels can be rechecked after the diuretics were adjusted during 11/07/2018 office visit. Attempted manual transmission several times and unable to send due to screen was showing low battery.  She made sure the plug was pushed in the outlet and screen showed it was charging.  Advised would call back tomorrow to try another manual send. Best time to reach her tomorrow is 8:30 AM.

## 2018-11-12 ENCOUNTER — Other Ambulatory Visit: Payer: Self-pay | Admitting: Nurse Practitioner

## 2018-11-12 NOTE — Telephone Encounter (Signed)
Returned call to [name].  She reported [employer] is sending new monitor in 7-10 days.  Asked how patient is feeling.  She said she is using Albuterol which is helping with shortness of breath. Today's weight is 151 lbs at home. She said patient weighed 171 lbs in Dr Olin Pia office on 3/12 which is a 20 lb difference.  Asked if scales are correct at home and she said yes.  She will reweigh her and call back.  BP today was 135/66 pulse 76.  Denies any dizziness, lightheadedness, or feeling faint and advised if she has any of those symptoms to call back or if urgent than use the ER.  She reports patient is feeling fine at this time. She has encouraged her to continue to drink plenty of fluids since the Furosemide was doubled.  Yesterday was last day of increased dosage.  She will call back if anything is needed or changes.  Advised to weigh and record daily weights before eating or drinking with same clothes on.

## 2018-11-12 NOTE — Telephone Encounter (Signed)
Requested medication (s) are due for refill today: yes  Requested medication (s) are on the active medication list: no  Last refill:  10/15/18  Future visit scheduled: yes  Notes to clinic:  Prescription end date 10/15/18   Requested Prescriptions  Pending Prescriptions Disp Refills   celecoxib (CELEBREX) 100 MG capsule [Pharmacy Med Name: CELECOXIB 100 MG CAPSULE] 30 capsule 0    Sig: Take 1 capsule (100 mg total) by mouth as needed (once daily as needed only for knee pain).     Analgesics:  COX2 Inhibitors Passed - 11/12/2018 12:05 PM      Passed - HGB in normal range and within 360 days    Hemoglobin  Date Value Ref Range Status  09/05/2018 14.1 11.1 - 15.9 g/dL Final         Passed - Cr in normal range and within 360 days    Creatinine  Date Value Ref Range Status  08/30/2011 0.79 0.60 - 1.30 mg/dL Final   Creat  Date Value Ref Range Status  12/20/2010 0.78 0.40 - 1.20 mg/dL Final   Creatinine, Ser  Date Value Ref Range Status  11/07/2018 0.78 0.57 - 1.00 mg/dL Final         Passed - Patient is not pregnant      Passed - Valid encounter within last 12 months    Recent Outpatient Visits          6 days ago Late onset Alzheimer's disease without behavioral disturbance ([place])   [place], Jolene T, NP   2 months ago Late onset Alzheimer's disease without behavioral disturbance ([place])   [place], Jolene T, NP   5 months ago Dementia without behavioral disturbance, unspecified dementia type ([place])   [place] Cannady, Jolene T, NP   8 months ago Essential hypertension, benign   [facility] [place], Malachy Mood, NP   9 months ago Herpes zoster without complication   [employer], [place], DO      Future Appointments            In 2 months Cannady, [name], NP [employer]

## 2018-11-12 NOTE — Telephone Encounter (Signed)
Daughter returned call.  Patient's at home weight this morning was not correct.  She said the weight is 168.2 lbs which is a 2.8 lbs weight loss since 11/07/2018 office appt.

## 2018-11-12 NOTE — Telephone Encounter (Signed)
Call to [name], [place].  Attempted to assist sending remote manual transmission and she reported error code.  Provided [employer] Support Service number and encouraged to call for assistance with monitor.  She said she would try them today.  Provided direct ICM number and she will call back with an update on the monitor.

## 2018-11-18 ENCOUNTER — Ambulatory Visit (INDEPENDENT_AMBULATORY_CARE_PROVIDER_SITE_OTHER): Payer: Medicare Other

## 2018-11-18 ENCOUNTER — Other Ambulatory Visit: Payer: Self-pay

## 2018-11-18 DIAGNOSIS — I5022 Chronic systolic (congestive) heart failure: Secondary | ICD-10-CM

## 2018-11-18 DIAGNOSIS — Z95810 Presence of automatic (implantable) cardiac defibrillator: Secondary | ICD-10-CM

## 2018-11-18 LAB — CUP PACEART INCLINIC DEVICE CHECK
Date Time Interrogation Session: 20200323132304
Implantable Lead Implant Date: 20040220
Implantable Lead Implant Date: 20070426
Implantable Lead Location: 753859
Implantable Lead Location: 753860
Implantable Lead Location: 753860
Implantable Lead Model: 6949
Implantable Pulse Generator Implant Date: 20180411
MDC IDC LEAD IMPLANT DT: 20070511

## 2018-11-18 NOTE — Progress Notes (Signed)
EPIC Encounter for ICM Monitoring  Patient Name: Barbara Blackburn is a 85 y.o. female Date: 11/18/2018 Primary Care Physican: Venita Lick, NP Primary Cardiologist: Rockey Situ Electrophysiologist: Caryl Comes 11/18/2018 Weight: 169 lbs        1st remote.  Spoke with [name], [place].  Heart Failure questions reviewed.  Patient has been [name] of breath in the shower over the last few days.  Weight has been elevated 4 pounds for the month of March in comparison to February.  Patient took extra Furosemide for total of 80 mg daily from 3/12 - 3/16 which slightly improved impedance but then worsened again.       Diet: Discuss liming salt intake to 2000 mg daily which after discussing types of foods, she may be exceeding that limit.  Pt has been adhering to fluid intake to 64 oz daily but daughter did increase fluid intake when patient was taking extra Furosemide but unsure how much she was drinking.   Report: Thoracic impedance abnormal suggesting fluid accumulation starting December 2019.   Minimal thoracic impedance improvement after taking 5 days of 80 mg of Furosemide daily.  Prescribed: Furosemide 40 mg take 1 tablet at noon.   Labs: 11/07/2018 Creatinine 0.78, BUN 23, Potassium 3.9, Sodium 147, GFR 69-79 09/05/2018 Creatinine 0.81, BUN 16, Potassium 4.0, Sodium 144, GFR 66-46  01/01/2018 Creatinine 0.72, BUN 22, Potassium 3.6, Sodium 139, GFR >60  A complete set of results can be found in Results Review.  Recommendations:  Advised to review food labels for salt amount and to limit to 2000 mg daily.  Advised if breathing worsens to call back  Follow-up plan: ICM clinic phone appointment on 11/25/2018 to recheck fluid levels.       Copy of ICM check and message sent to Dr. Caryl Comes for review and recommendations.  3 month ICM trend: 11/18/2018    1 Year ICM trend:       [name], RN 11/18/2018 2:44 PM

## 2018-11-19 MED ORDER — FUROSEMIDE 40 MG PO TABS: ORAL_TABLET | ORAL | 3 refills | Status: DC

## 2018-11-19 NOTE — Progress Notes (Signed)
Spoke with daughter. Advised Dr Caryl Comes has recommended to increase Furosemide 40 mg to 2 tablets (80 mg total) every morning and 1 tablet (40 mg total) every afternoon.  Advised to wait at least 6-7 hours between dosages.  Reiterated that fluid intake should be limited to 64 oz daily even with the increase of Lasix and provided explanation why the fluid limit is important.    She verbalized understanding of new Furosemide dosage and to limit fluids to 64 oz daily and salt to 2000 mg daily.  She said they have gotten rid of the salt shaker and are looking at labels.  She said they threw out a lot of foods to that were high in salt.  Encouraged to use herbs, flavored vinegars and oils for flavor.  Reviewed symptoms of dehydration to report and call if she experiences any symptoms.  Advised to weigh daily.  Next remote transmission 11/25/2018.

## 2018-11-19 NOTE — Progress Notes (Signed)
Message received from Dr Caryl Comes with recommendations:  From: Deboraha Sprang, MD Sent: 11/19/2018   1:53 PM EDT To: [name], RN Subject: RE: Leanne Lovely recheck today                      Did you help her understand not to increase po intake  Lets increase her furosemide to 80 /40 going forward Thks steve

## 2018-11-25 ENCOUNTER — Other Ambulatory Visit: Payer: Self-pay

## 2018-11-25 ENCOUNTER — Ambulatory Visit (INDEPENDENT_AMBULATORY_CARE_PROVIDER_SITE_OTHER): Payer: Medicare Other

## 2018-11-25 DIAGNOSIS — I5022 Chronic systolic (congestive) heart failure: Secondary | ICD-10-CM

## 2018-11-25 DIAGNOSIS — Z95810 Presence of automatic (implantable) cardiac defibrillator: Secondary | ICD-10-CM

## 2018-11-25 LAB — CUP PACEART REMOTE DEVICE CHECK
Battery Remaining Longevity: 106 mo
Battery Voltage: 3.01 V
Brady Statistic AP VP Percent: 0 %
Brady Statistic AP VS Percent: 0 %
Brady Statistic AS VP Percent: 0 %
Brady Statistic AS VS Percent: 0 %
Brady Statistic RA Percent Paced: 0 %
Brady Statistic RV Percent Paced: 36.14 %
Date Time Interrogation Session: 20200330114342
HighPow Impedance: 39 Ohm
HighPow Impedance: 46 Ohm
Implantable Lead Implant Date: 20040220
Implantable Lead Implant Date: 20070426
Implantable Lead Implant Date: 20070511
Implantable Lead Location: 753859
Implantable Lead Location: 753860
Implantable Lead Location: 753860
Implantable Lead Model: 5076
Implantable Lead Model: 5092
Implantable Lead Model: 6949
Implantable Pulse Generator Implant Date: 20180411
Lead Channel Impedance Value: 342 Ohm
Lead Channel Impedance Value: 399 Ohm
Lead Channel Impedance Value: 399 Ohm
Lead Channel Pacing Threshold Amplitude: 0.625 V
Lead Channel Pacing Threshold Pulse Width: 0.4 ms
Lead Channel Sensing Intrinsic Amplitude: 0.375 mV
Lead Channel Sensing Intrinsic Amplitude: 0.75 mV
Lead Channel Sensing Intrinsic Amplitude: 1 mV
Lead Channel Sensing Intrinsic Amplitude: 1 mV
Lead Channel Setting Pacing Amplitude: 2.5 V
Lead Channel Setting Pacing Pulse Width: 0.4 ms
Lead Channel Setting Sensing Sensitivity: 0.3 mV

## 2018-11-26 NOTE — Progress Notes (Signed)
EPIC Encounter for ICM Monitoring  Patient Name: Barbara Blackburn is a 85 y.o. female Date: 11/26/2018 Primary Care Physican: Venita Lick, NP Primary Cardiologist: Rockey Situ Electrophysiologist: Caryl Comes 11/18/2018 Weight: 169 lbs  11/26/2018 Weight: 165.4 lbs                                                           Spoke with daughter Barbara Blackburn, [place].  Heart Failure questions reviewed.  SOB has resolved since increasing Furosemide.  Patient is feeling much better.   Daughter is reviewing food labels and decreasing salt intake and restricting fluids to 64 oz daily.     Report: Thoracic impedance trended up to baseline normal.  Prescribed: Furosemide 40 mg take 2 tablets (80 mg total) every morning and 1 tablet (40 mg total) every evening (increased 11/18/2018).  Labs: 11/07/2018 Creatinine 0.78, BUN 23, Potassium 3.9, Sodium 147, GFR 69-79 09/05/2018 Creatinine 0.81, BUN 16, Potassium 4.0, Sodium 144, GFR 66-46  01/01/2018 Creatinine 0.72, BUN 22, Potassium 3.6, Sodium 139, GFR >60  A complete set of results can be found in Results Review.  Recommendations:  Advised to review food labels for salt amount and to limit to 2000 mg daily.  Advised if breathing worsens to call back  Follow-up plan: ICM clinic phone appointment on 12/09/2018.       Copy of ICM check and message sent to Dr. Caryl Comes  3 month ICM trend: 11/25/2018    1 Year ICM trend:       Barbara Billings, RN 11/26/2018 8:42 AM

## 2018-12-05 ENCOUNTER — Other Ambulatory Visit: Payer: Medicare Other

## 2018-12-09 ENCOUNTER — Ambulatory Visit (INDEPENDENT_AMBULATORY_CARE_PROVIDER_SITE_OTHER): Payer: Medicare Other

## 2018-12-09 ENCOUNTER — Other Ambulatory Visit: Payer: Self-pay

## 2018-12-09 DIAGNOSIS — I5022 Chronic systolic (congestive) heart failure: Secondary | ICD-10-CM

## 2018-12-09 DIAGNOSIS — Z95810 Presence of automatic (implantable) cardiac defibrillator: Secondary | ICD-10-CM

## 2018-12-10 NOTE — Progress Notes (Signed)
EPIC Encounter for ICM Monitoring  Patient Name: Barbara Blackburn is a 85 y.o. female Date: 12/10/2018 Primary Care Physican: Barbara Lick, NP Primary Cardiologist:Barbara Blackburn Electrophysiologist:Barbara Blackburn 3/23/2018Weight:169lbs  11/26/2018 Weight: 165.4 lbs 12/09/2018 Weight: 164.8 lbs   Spoke with daughter Barbara Blackburn, [place]. Heart Failure questions reviewed.Pt is asymptomatic.     Thoracic impedance normal.  Prescribed: Furosemide40 mg take 2 tablets (80 mg total) every morning and 1 tablet (40 mg total) every evening (increased 11/18/2018).  Labs: 11/07/2018 Creatinine0.78Evlyn Blackburn, Potassium3.9, [id number], [id number] 09/05/2018 Creatinine0.81, BUN16, Potassium4.0, [id number], [id number]  05/07/2019Creatinine 0.72, BUN22, Potassium3.6, Sodium139, GFR>60 A complete set of results can be found in Results Review.  Recommendations:Advised to review food labels for salt amount and to limit to 2000 mg daily.   Follow-up plan: ICM clinic phone appointment 01/13/2019.    Copy of ICM check sent to Dr. Caryl Blackburn.   3 month ICM trend: 12/09/2018   1 Year ICM trend:       Barbara Billings, RN 12/10/2018 8:48 AM

## 2018-12-17 ENCOUNTER — Other Ambulatory Visit: Payer: Self-pay

## 2018-12-17 ENCOUNTER — Ambulatory Visit (INDEPENDENT_AMBULATORY_CARE_PROVIDER_SITE_OTHER): Payer: Medicare Other | Admitting: *Deleted

## 2018-12-17 DIAGNOSIS — I5022 Chronic systolic (congestive) heart failure: Secondary | ICD-10-CM

## 2018-12-17 DIAGNOSIS — I428 Other cardiomyopathies: Secondary | ICD-10-CM

## 2018-12-17 LAB — CUP PACEART REMOTE DEVICE CHECK
Battery Remaining Longevity: 106 mo
Battery Voltage: 3.01 V
Brady Statistic AP VP Percent: 0 %
Brady Statistic AP VS Percent: 0 %
Brady Statistic AS VP Percent: 0 %
Brady Statistic AS VS Percent: 0 %
Brady Statistic RA Percent Paced: 0 %
Brady Statistic RV Percent Paced: 50.15 %
Date Time Interrogation Session: 20200421041603
HighPow Impedance: 40 Ohm
HighPow Impedance: 52 Ohm
Implantable Lead Implant Date: 20040220
Implantable Lead Implant Date: 20070426
Implantable Lead Implant Date: 20070511
Implantable Lead Location: 753859
Implantable Lead Location: 753860
Implantable Lead Location: 753860
Implantable Lead Model: 5076
Implantable Lead Model: 5092
Implantable Lead Model: 6949
Implantable Pulse Generator Implant Date: 20180411
Lead Channel Impedance Value: 380 Ohm
Lead Channel Impedance Value: 399 Ohm
Lead Channel Impedance Value: 456 Ohm
Lead Channel Pacing Threshold Amplitude: 0.875 V
Lead Channel Pacing Threshold Pulse Width: 0.4 ms
Lead Channel Sensing Intrinsic Amplitude: 0.375 mV
Lead Channel Sensing Intrinsic Amplitude: 0.75 mV
Lead Channel Sensing Intrinsic Amplitude: 0.875 mV
Lead Channel Sensing Intrinsic Amplitude: 0.875 mV
Lead Channel Setting Pacing Amplitude: 2.5 V
Lead Channel Setting Pacing Pulse Width: 0.4 ms
Lead Channel Setting Sensing Sensitivity: 0.3 mV

## 2018-12-23 NOTE — Progress Notes (Signed)
Remote ICD transmission.   

## 2018-12-26 ENCOUNTER — Telehealth: Payer: Self-pay | Admitting: Internal Medicine

## 2018-12-26 NOTE — Telephone Encounter (Signed)
Call received from [place], in scheduling that Dr. Caryl Comes had messaged her about the patient needing an e-visit due to a shock on 11/07/18. She called to schedule for 5/1 with Dr. Caryl Comes, but the patient's daughter was unsure why.  I advised Gabriel Cirri, that Dr. Caryl Comes and I spoke about this last week and she did not need the appt as he saw her in clinic on 3/12, the day of her shock.  I advised I would follow up with the patient's daughter, [name] to discuss.  I did review further with Dr. Caryl Comes prior to calling [name] and he advised the patient's VT had been quiet, but fluid trends up on most recent transmission from April. If the patient is doing ok and no increased SOB, he would recommend transmitting next week to f/u on her fluid levels.  I called and discussed the above with the patient's daughter, [name]. I advised that the information from March was not deleted from Dr. Olin Pia work to complete, and that the f/u tomorrow was not needed if she was doing ok.  [name] states the patient is doing well after her last adjustment of her diuretics.  I advised I will cancel the appt with Dr. Caryl Comes for tomorrow and will ask Sharman Cheek, RN to reach out to her about what day might be good for her schedule to have the patient transmit for ICM clinic next week.  [name] voices understanding and is agreeable.

## 2018-12-27 ENCOUNTER — Telehealth: Payer: Medicare Other | Admitting: Internal Medicine

## 2018-12-27 NOTE — Telephone Encounter (Signed)
Attempted call to daughter regarding remote transmission for fluid level recheck.  Left a message to send a manual transmission on Tuesday, 5/5.  Advised to call back if unable to send transmission that date and can reschedule.

## 2018-12-31 ENCOUNTER — Telehealth: Payer: Self-pay

## 2018-12-31 ENCOUNTER — Ambulatory Visit (INDEPENDENT_AMBULATORY_CARE_PROVIDER_SITE_OTHER): Payer: Medicare Other

## 2018-12-31 ENCOUNTER — Other Ambulatory Visit: Payer: Self-pay

## 2018-12-31 DIAGNOSIS — I5022 Chronic systolic (congestive) heart failure: Secondary | ICD-10-CM

## 2018-12-31 DIAGNOSIS — Z95810 Presence of automatic (implantable) cardiac defibrillator: Secondary | ICD-10-CM

## 2018-12-31 NOTE — Progress Notes (Signed)
EPIC Encounter for ICM Monitoring  Patient Name: Barbara Blackburn is a 85 y.o. female Date: 12/31/2018 Primary Care Physican: Venita Lick, NP Primary Cardiologist:Gollan Electrophysiologist:Klein 3/23/2020Weight:169lbs 12/09/2018 Weight: 164.8 lbs 12/31/2018 Weight: 163.6 lbs   Spoke with daughter Barbara Blackburn, [place]. Heart Failure questions reviewed.Pt is asymptomatic and has not complaints.    Opt[employer]ol Thoracic impedancehad been abnormal since December before returning back to normal in the last few days.    Prescribed: Furosemide40 mg take2tablets (80 mg total) every morning and 1 tablet (40 mg total) every evening (increased 11/18/2018).  Labs: 11/07/2018 Creatinine0.78[name], Potassium3.9, [id number], [id number] 09/05/2018 Creatinine0.81, BUN16, Potassium4.0, [id number], [id number]  05/07/2019Creatinine 0.72, BUN22, Potassium3.6, Sodium139, GFR>60 A complete set of results can be found in Results Review.  Recommendations:No changes today.   Follow-up plan: ICM clinic phone appointment 01/13/2019.  3 month ICM trend: 12/31/2018    1 Year ICM trend:       [name], RN 12/31/2018 3:48 PM

## 2018-12-31 NOTE — Telephone Encounter (Signed)
Unable to leave a message for patient to remind of missed remote transmission.  

## 2019-01-07 ENCOUNTER — Ambulatory Visit (INDEPENDENT_AMBULATORY_CARE_PROVIDER_SITE_OTHER): Payer: Medicare Other

## 2019-01-07 ENCOUNTER — Other Ambulatory Visit: Payer: Self-pay

## 2019-01-07 DIAGNOSIS — I5022 Chronic systolic (congestive) heart failure: Secondary | ICD-10-CM

## 2019-01-07 DIAGNOSIS — I428 Other cardiomyopathies: Secondary | ICD-10-CM | POA: Diagnosis not present

## 2019-01-07 NOTE — Progress Notes (Unsigned)
   Patient ID: Barbara Blackburn, female    DOB: [birth year], 85 y.o.   MRN: [id number]  Low LVEF derived from echo performed today, reported to Dr Fletcher Anon. He advised sending a message to Dr Caryl Comes. Secure Chat message sent to Dr Caryl Comes giving my impression of low LVEF based on today's echocardiogram and comparing it to the patient's previous LVEF from 2017.     Review of Systems    Physical Exam

## 2019-01-10 ENCOUNTER — Telehealth: Payer: Self-pay | Admitting: Internal Medicine

## 2019-01-10 MED ORDER — SPIRONOLACTONE 25 MG PO TABS: 25.0000 mg | ORAL_TABLET | Freq: Every day | ORAL | 3 refills | Status: DC

## 2019-01-10 NOTE — Telephone Encounter (Signed)
The patient's echo results were reviewed with Dr. Caryl Comes. EF has gone down to 25-30 %.   Orders received to: 1) Stop amlodipine  2) Start aldactone 25 mg- take 1 tablet by mouth once daily 3) come in for re-programming of her device on 01/16/19.  I spoke with the patient's daughter, [name] (ok per [facility]) regarding the patient's echo results. She is aware of Dr. Olin Pia recommendations and voices understanding.   It is very hard to get her up and going due to some mobility issues. They will come in at 12:30 pm to see Dr. Caryl Comes for re-programming.  I have advised [name] that they will need to wear mask. She will not be able to come back with the patient, but we can come out and advise her of what has transpired as the patient has some mild dementia.  [name] is agreeable and voices understanding.

## 2019-01-13 ENCOUNTER — Ambulatory Visit (INDEPENDENT_AMBULATORY_CARE_PROVIDER_SITE_OTHER): Payer: Medicare Other

## 2019-01-13 ENCOUNTER — Other Ambulatory Visit: Payer: Self-pay

## 2019-01-13 DIAGNOSIS — I5022 Chronic systolic (congestive) heart failure: Secondary | ICD-10-CM

## 2019-01-13 DIAGNOSIS — Z95810 Presence of automatic (implantable) cardiac defibrillator: Secondary | ICD-10-CM

## 2019-01-14 NOTE — Progress Notes (Signed)
EPIC Encounter for ICM Monitoring  Patient Name: Barbara Blackburn is a 85 y.o. female Date: 01/14/2019 Primary Care Physican: Venita Lick, NP Primary Cardiologist:Gollan Electrophysiologist:Klein 3/23/2020Weight:169lbs 12/09/2018 Weight:164.8lbs 12/31/2018 Weight: 163.6 lbs 01/15/2019 Weight: 163.4 lbs    Spoke with [name], [place]. Heart Failure questions reviewed.Pt isasymptomatic and has not complaints.  Optivol Thoracic impedancenormal.    Prescribed: Furosemide40 mg take2tablets (80 mg total) every morning and 1 tablet (40 mg total) every evening (increased 11/18/2018).  Labs: 11/07/2018 Creatinine0.78[name], Potassium3.9, [id number], [id number] 09/05/2018 Creatinine0.81, BUN16, Potassium4.0, [id number], [id number]  05/07/2019Creatinine 0.72, BUN22, Potassium3.6, Sodium139, GFR>60 A complete set of results can be found in Results Review.  Recommendations:No changes and encouraged to call for any fluid symptoms.   Follow-up plan: ICM clinic phone appointment6/22/2020.  Office appointment with Dr Caryl Comes and 01/16/2019 and 02/11/2019.  3 month ICM trend: 01/13/2019    1 Year ICM trend:       [name], RN 01/14/2019 4:55 PM

## 2019-01-16 ENCOUNTER — Ambulatory Visit (INDEPENDENT_AMBULATORY_CARE_PROVIDER_SITE_OTHER): Payer: Medicare Other | Admitting: Internal Medicine

## 2019-01-16 ENCOUNTER — Other Ambulatory Visit: Payer: Self-pay

## 2019-01-16 VITALS — BP 128/64 | Wt 160.0 lb

## 2019-01-16 DIAGNOSIS — Z79899 Other long term (current) drug therapy: Secondary | ICD-10-CM

## 2019-01-16 DIAGNOSIS — I428 Other cardiomyopathies: Secondary | ICD-10-CM | POA: Diagnosis not present

## 2019-01-16 DIAGNOSIS — I4821 Permanent atrial fibrillation: Secondary | ICD-10-CM

## 2019-01-16 DIAGNOSIS — I5022 Chronic systolic (congestive) heart failure: Secondary | ICD-10-CM | POA: Diagnosis not present

## 2019-01-16 MED ORDER — FUROSEMIDE 40 MG PO TABS: ORAL_TABLET | ORAL | Status: AC

## 2019-01-16 NOTE — Patient Instructions (Addendum)
Medication Instructions:  - Your physician has recommended you make the following change in your medication:   1) Lasix (furosemide) 40 mg- take 3 tablets (120 mg) by mouth once EVERY OTHER day   If you need a refill on your cardiac medications before your next appointment, please call your pharmacy.   Lab work: - Your physician recommends that you have lab work today: BMP  If you have labs (blood work) drawn today and your tests are completely normal, you will receive your results only by: [name] MyChart Message (if you have MyChart) OR . A paper copy in the mail If you have any lab test that is abnormal or we need to change your treatment, we will call you to review the results.  Testing/Procedures: - none ordered  Follow-Up: At [facility], you and your health needs are our priority.  As part of our continuing mission to provide you with exceptional heart care, we have created designated Provider Care Teams.  These Care Teams include your primary Cardiologist (physician) and Advanced Practice Providers (APPs -  Physician Assistants and Nurse Practitioners) who all work together to provide you with the care you need, when you need it.  You will need a follow up appointment in 3 months (August) with Dr. Caryl Comes . Please call our office 2 months in advance to schedule this appointment.  (call in mid to late June to schedule)  Remote monitoring is used to monitor your Pacemaker of ICD from home. This monitoring reduces the number of office visits required to check your device to one time per year. It allows [place] to keep an eye on the functioning of your device to ensure it is working properly. You are scheduled for a device check from home on 03/18/2019. You may send your transmission at any time that day. If you have a wireless device, the transmission will be sent automatically. After your physician reviews your transmission, you will receive a postcard with your next transmission date.   Any  Other Special Instructions Will Be Listed Below (If Applicable). - N/A

## 2019-01-16 NOTE — Progress Notes (Signed)
.      Patient Care Team: Venita Lick, NP as PCP - General (Nurse Practitioner) Winfield Rast, DC as Referring Physician (Chiropractic Medicine) Deboraha Sprang, MD as Consulting Physician (Cardiology) Minna Merritts, MD as Consulting Physician (Cardiology) Marry Guan, Laurice Record, MD (Orthopedic Surgery)   HPI  Barbara Blackburn is a 86 y.o. female Seen in followup for a dual chamber ICD implanted for syncope in the setting of  Nonischemic and  ischemic cardiomyopathy with prior STENTING  and congestive heart failure. She had  received a defibrillator with a 6949-lead.  At  device generator replacement in 2012; she underwent "pirating" of her rate sense pacing lead and capping of the rate sense portion of her ICD lead  She reached ERI and underwent generator replacement 4/18.  Atrial fibrillation.  Permanent.  Prior stroke.  Anticoagulation with rivaroxaban.   Seen in 2/20  following an ICD discharge  ATP failed ICD discharge terminated V T/VF as well as transiently atrial fibrillation.  Found to be volume overloaded  optiovol elevated and diuresed to good effect  Repeat echo As Below    Myoview per [facility]) notes 2/14>>EF 50% w inferior scar  DATE TEST    2012        EF 20.25 %   2/14    Myoview   EF 50 %   4/17 Echo EF 60-65%   5/20 Echo  25-20$% LAE (4.9/-/63)       Date Cr K GFR Hgb  7/18 0.81  60s 15.2  5/19  0.72 3.6  15.1  1/20 0.81 4.0  14.1    les sob  No edema no chest pain but feels weak and tired.  Denies orthostatic lightheadedness.  Talk to her daughter.  Impressed at how much better her breathing is.  Dementia is an issue.  No bleeding.  Thromboembolic risk factors ( age  -2, HTN-1, TIA/CVA-2, Vasc disease -1, Gender-1) for a CHADSVASc Score of 7    Past Medical History:  Diagnosis Date  . 6949-lead 11/11/2013  . Allergy   . CHF (congestive heart failure) (HCC)    class 2  . Chronic atrial fibrillation   . Coronary artery disease   .  Degenerative joint disease    severe  . Dyslipidemia   . Hyperlipidemia   . ICD-Medtronic 05/10/2009   Qualifier: Diagnosis of  By: Lovena Le, MD, [facility], Binnie Kand   . Nonischemic cardiomyopathy [facility])     Past Surgical History:  Procedure Laterality Date  . ICD GENERATOR CHANGEOUT N/A 12/06/2016   Procedure: ICD Generator Changeout;  Surgeon: Deboraha Sprang, MD;  Location: [place] CV LAB;  Service: Cardiovascular;  Laterality: N/A;  . Medtronic dual-chamber ICD  01/05/2006   generator change April 2012  . PCI stent    . REPLACEMENT TOTAL KNEE      Current Outpatient Medications  Medication Sig Dispense Refill  . albuterol (PROVENTIL) (2.5 MG/3ML) 0.083% nebulizer solution Take 3 mLs (2.5 mg total) by nebulization every 6 (six) hours as needed for wheezing or shortness of breath. 150 mL 1  . Cholecalciferol (VITAMIN D) 2000 units tablet Take 2,000 Units by mouth daily.    [name] ENSURE PLUS (ENSURE PLUS) LIQD Take 237 mLs by mouth.    . furosemide (LASIX) 40 MG tablet Take 2 tablets (80 mg total) by mouth every morning and 1 tablet (40 mg total) every evening. 270 tablet 3  . galantamine (RAZADYNE) 4 MG tablet Take 1 tablet (  4 mg total) by mouth 2 (two) times daily with a meal. 180 tablet 2  . isosorbide mononitrate (IMDUR) 30 MG 24 hr tablet Take 1 tablet (30 mg total) by mouth daily. 30 tablet 6  . losartan (COZAAR) 50 MG tablet Take 1 tablet (50 mg total) by mouth daily. 90 tablet 3  . omeprazole (PRILOSEC) 20 MG capsule     . rivaroxaban (XARELTO) 20 MG TABS tablet TAKE 1 TABLET BY MOUTH  DAILY WITH SUPPER 90 tablet 3  . rosuvastatin (CRESTOR) 10 MG tablet Take 1 tablet (10 mg total) by mouth daily. 90 tablet 0  . sertraline (ZOLOFT) 50 MG tablet Take 1 tablet (50 mg total) by mouth daily. 90 tablet 3  . spironolactone (ALDACTONE) 25 MG tablet Take 1 tablet (25 mg total) by mouth daily. 90 tablet 3   No current facility-administered medications for this visit.     Allergies   Allergen Reactions  . Clonazepam     Nightmare   . Fluoxetine Hcl Other (See Comments)    nightmares  . Lisinopril     Cough   . Zolpidem Tartrate Other (See Comments)    Nightmares     Review of Systems negative except from HPI and PMH BP 128/64 (BP Location: Left Arm, Patient Position: Sitting, Cuff Size: Normal)   Wt 160 lb (72.6 kg)   LMP  (LMP Unknown)   BMI 30.23 kg/m   Physical Exam Well developed and well nourished in no acute distress HENT normal Neck supple   Device pocket well healed; without hematoma or erythema.  There is no tethering  IRR No Clubbing cyanosis   edema Skin-warm and dry A & Oriented  Grossly normal sensory and motor function     Assessment and  Plan  Nonischemic cardiomyopathy  Atrial fibrillation permanent  Congestive heart failure-chronic/acute systolic  Syncope  VT/VF with appropriate interval therapy  Hypertension   Falls  ICD for secondary prevention  The patient's device was interrogated.  The information was reviewed. No changes were made in the programming.     6949-lead with capping of the rate sense portion    No interval ventricular arrhythmias  Atrial fibrillation rates are considerably faster over the last 4 months.  Ventricular pacing percentage seems to be about 30% from cardiac Compass  We will check electrolytes today on the increased diuretics; I have reviewed with her daughter the physiology of fluid.  We will decrease her p.o. intake from the 8 classes a day and decrease her diuretics to 120 every other day.  We will continue to follow [facility] in [facility].  Because of her new cardiomyopathy is not clear.  It does not appear that atrial fibrillation rates are that fast.  Her ventricular pacing percentage off of interrogation was about 40 to 50%; I suspect some of this was fusion and indeed pseudofusion.  She is currently euvolemic and will be followed as above.  Once we have her blood work back, would  anticipate trying low-dose beta-blockers with the hopes of avoiding aggravating of her ventricular pacing percentage.  We will plan device interrogation in 2 weeks to review histograms and VP percentage  We spent more than 50% of our >25 min visit in face to face counseling regarding the above

## 2019-01-17 LAB — CBC WITH DIFFERENTIAL/PLATELET
Basophils Absolute: 0.1 10*3/uL (ref 0.0–0.2)
Basos: 1 %
EOS (ABSOLUTE): 0.1 10*3/uL (ref 0.0–0.4)
Eos: 2 %
Hematocrit: 42.6 % (ref 34.0–46.6)
Hemoglobin: 14.4 g/dL (ref 11.1–15.9)
Immature Grans (Abs): 0 10*3/uL (ref 0.0–0.1)
Immature Granulocytes: 0 %
Lymphocytes Absolute: 1.4 10*3/uL (ref 0.7–3.1)
Lymphs: 20 %
MCH: 28.4 pg (ref 26.6–33.0)
MCHC: 33.8 g/dL (ref 31.5–35.7)
MCV: 84 fL (ref 79–97)
Monocytes Absolute: 0.5 10*3/uL (ref 0.1–0.9)
Monocytes: 7 %
Neutrophils Absolute: 5.1 10*3/uL (ref 1.4–7.0)
Neutrophils: 70 %
Platelets: 182 10*3/uL (ref 150–450)
RBC: 5.07 x10E6/uL (ref 3.77–5.28)
RDW: 15.6 % — ABNORMAL HIGH (ref 11.7–15.4)
WBC: 7.3 10*3/uL (ref 3.4–10.8)

## 2019-01-17 LAB — BASIC METABOLIC PANEL
BUN/Creatinine Ratio: 22 (ref 12–28)
BUN: 20 mg/dL (ref 8–27)
CO2: 21 mmol/L (ref 20–29)
Calcium: 9.3 mg/dL (ref 8.7–10.3)
Chloride: 102 mmol/L (ref 96–106)
Creatinine, Ser: 0.89 mg/dL (ref 0.57–1.00)
GFR calc Af Amer: 67 mL/min/{1.73_m2} (ref >59)
GFR calc non Af Amer: 58 mL/min/{1.73_m2} — ABNORMAL LOW (ref >59)
Glucose: 113 mg/dL — ABNORMAL HIGH (ref 65–99)
Potassium: 4.2 mmol/L (ref 3.5–5.2)
Sodium: 141 mmol/L (ref 134–144)

## 2019-01-27 ENCOUNTER — Other Ambulatory Visit: Payer: Self-pay | Admitting: Nurse Practitioner

## 2019-02-05 ENCOUNTER — Telehealth: Payer: Self-pay | Admitting: Nurse Practitioner

## 2019-02-05 NOTE — Telephone Encounter (Signed)
Called pt to set up virtual visit, no answer, left voicemail. °

## 2019-02-06 ENCOUNTER — Ambulatory Visit: Payer: Medicare Other | Admitting: Nurse Practitioner

## 2019-02-11 ENCOUNTER — Encounter: Payer: Medicare Other | Admitting: Internal Medicine

## 2019-02-12 ENCOUNTER — Ambulatory Visit: Payer: Self-pay | Admitting: *Deleted

## 2019-02-12 ENCOUNTER — Encounter: Payer: Self-pay | Admitting: Nurse Practitioner

## 2019-02-12 ENCOUNTER — Ambulatory Visit (INDEPENDENT_AMBULATORY_CARE_PROVIDER_SITE_OTHER): Payer: Medicare Other | Admitting: Nurse Practitioner

## 2019-02-12 ENCOUNTER — Other Ambulatory Visit: Payer: Self-pay

## 2019-02-12 VITALS — BP 130/87 | HR 67 | Temp 98.7°F | Wt 160.2 lb

## 2019-02-12 DIAGNOSIS — I10 Essential (primary) hypertension: Secondary | ICD-10-CM

## 2019-02-12 DIAGNOSIS — I4821 Permanent atrial fibrillation: Secondary | ICD-10-CM

## 2019-02-12 DIAGNOSIS — M150 Primary generalized (osteo)arthritis: Secondary | ICD-10-CM | POA: Diagnosis not present

## 2019-02-12 DIAGNOSIS — F0280 Dementia in other diseases classified elsewhere without behavioral disturbance: Secondary | ICD-10-CM

## 2019-02-12 DIAGNOSIS — G301 Alzheimer's disease with late onset: Secondary | ICD-10-CM

## 2019-02-12 DIAGNOSIS — E7800 Pure hypercholesterolemia, unspecified: Secondary | ICD-10-CM

## 2019-02-12 DIAGNOSIS — E538 Deficiency of other specified B group vitamins: Secondary | ICD-10-CM | POA: Insufficient documentation

## 2019-02-12 DIAGNOSIS — M159 Polyosteoarthritis, unspecified: Secondary | ICD-10-CM

## 2019-02-12 NOTE — Chronic Care Management (AMB) (Signed)
  Chronic Care Management   Initial Visit Note  02/12/2019 Name: Barbara Blackburn MRN: [id number] DOB: July 14, 1929  Referred by: Venita Lick, NP Reason for referral : No chief complaint on file.   [name] is a 85 y.o. year old female who is a primary care patient of Cannady, Henrine Screws T, NP. The CCM team was consulted for assistance with chronic disease management and care coordination needs.   Review of patient status, including review of consultants reports, relevant laboratory and other test results, and collaboration with appropriate care team members and the patient's provider was performed as part of comprehensive patient evaluation and provision of chronic care management services.    SDOH (Social Determinants of Health) screening performed today. See Care Plan Entry related to challenges with: Physical Activity  Objective:   Goals Addressed            This Visit's Progress   . Physical therepy needs (pt-stated)       Current Barriers:  [name] Knowledge Deficits related to obtaining a [place] PT   Nurse Case Manager Clinical Goal(s):  [name] Over the next 30  days, patient will work with [facility]  to address needs related to PT referral   Interventions:  . Provided education to patient re: [place], and PT referral process . Discussed plans with patient for ongoing care management follow up and provided patient with direct contact information for care management team . Provided patient and/or caregiver with the process information about [place] PT referral  (community resource). . Patient with Memory issues  .  Patient Self Care Activities:  . Currently UNABLE TO independently obtain PT therapy.   Initial goal documentation         [name] was given information about Chronic Care Management services today including:  1. CCM service includes personalized support from designated clinical staff supervised by her physician, including individualized plan of  care and coordination with other care providers 2. 24/7 contact phone numbers for assistance for urgent and routine care needs. 3. Service will only be billed when office clinical staff spend 20 minutes or more in a month to coordinate care. 4. Only one practitioner may furnish and bill the service in a calendar month. 5. The patient may stop CCM services at any time (effective at the end of the month) by phone call to the office staff. 6. The patient will be responsible for cost sharing (co-pay) of up to 20% of the service fee (after annual deductible is met).  Patient agreed to services and verbal consent obtained.   Plan:   The care management team will reach out to the patient again over the next 3 days.  The patient has been provided with contact information for the care management team and has been advised to call with any health related questions or concerns.   Merlene Morse Captola Teschner RN, BSN Nurse Case Editor, commissioning [facility]  [phone]) Business Mobile

## 2019-02-12 NOTE — Assessment & Plan Note (Signed)
Chronic, ongoing.  Continue current medication regimen.  Obtain lipid panel next visit.

## 2019-02-12 NOTE — Assessment & Plan Note (Signed)
Chronic, ongoing.  Continue current medication regimen and collaboration with cardiology. 

## 2019-02-12 NOTE — Patient Instructions (Signed)
Dementia Caregiver Guide Dementia is a term used to describe a number of symptoms that affect memory and thinking. The most common symptoms include:  Memory loss.  Trouble with language and communication.  Trouble concentrating.  Poor judgment.  Problems with reasoning.  Child-like behavior and language.  Extreme anxiety.  Angry outbursts.  Wandering from home or public places. Dementia usually gets worse slowly over time. In the early stages, people with dementia can stay independent and safe with some help. In later stages, they need help with daily tasks such as dressing, grooming, and using the bathroom. How to help the person with dementia cope Dementia can be frightening and confusing. Here are some tips to help the person with dementia cope with changes caused by the disease. General tips  Keep the person on track with his or her routine.  Try to identify areas where the person may need help.  Be supportive, patient, calm, and encouraging.  Gently remind the person that adjusting to changes takes time.  Help with the tasks that the person has asked for help with.  Keep the person involved in daily tasks and decisions as much as possible.  Encourage conversation, but try not to get frustrated or harried if the person struggles to find words or does not seem to appreciate your help. Communication tips  When the person is talking or seems frustrated, make eye contact and hold the person's hand.  Ask specific questions that need yes or no answers.  Use simple words, short sentences, and a calm voice. Only give one direction at a time.  When offering choices, limit them to just 1 or 2.  Avoid correcting the person in a negative way.  If the person is struggling to find the right words, gently try to help him or her. How to recognize symptoms of stress Symptoms of stress in caregivers include:  Feeling frustrated or angry with the person with dementia.   Denying that the person has dementia or that his or her symptoms will not improve.  Feeling hopeless and unappreciated.  Difficulty sleeping.  Difficulty concentrating.  Feeling anxious, irritable, or depressed.  Developing stress-related health problems.  Feeling like you have too little time for your own life. Follow these instructions at home:   Make sure that you and the person you are caring for: ? Get regular sleep. ? Exercise regularly. ? Eat regular, nutritious meals. ? Drink enough fluid to keep your urine clear or pale yellow. ? Take over-the-counter and prescription medicines only as told by your health care providers. ? Attend all scheduled health care appointments.  Join a support group with others who are caregivers.  Ask about respite care resources so that you can have a regular break from the stress of caregiving.  Look for signs of stress in yourself and in the person you are caring for. If you notice signs of stress, take steps to manage it.  Consider any safety risks and take steps to avoid them.  Organize medications in a pill box for each day of the week.  Create a plan to handle any legal or financial matters. Get legal or financial advice if needed.  Keep a calendar in a central location to remind the person of appointments or other activities. Tips for reducing the risk of injury  Keep floors clear of clutter. Remove rugs, magazine racks, and floor lamps.  Keep hallways well lit, especially at night.  Put a handrail and nonslip mat in the bathtub or   shower.  Put childproof locks on cabinets that contain dangerous items, such as medicines, alcohol, guns, toxic cleaning items, sharp tools or utensils, matches, and lighters.  Put the locks in places where the person cannot see or reach them easily. This will help ensure that the person does not wander out of the house and get lost.  Be prepared for emergencies. Keep a list of emergency phone  numbers and addresses in a convenient area.  Remove car keys and lock garage doors so that the person does not try to get in the car and drive.  Have the person wear a bracelet that tracks locations and identifies the person as having memory problems. This should be worn at all times for safety. Where to find support: Many individuals and organizations offer support. These include:  Support groups for people with dementia and for caregivers.  Counselors or therapists.  Home health care services.  Adult day care centers. Where to find more information Alzheimer's Association: www.alz.org Contact a health care provider if:  The person's health is rapidly getting worse.  You are no longer able to care for the person.  Caring for the person is affecting your physical and emotional health.  The person threatens himself or herself, you, or anyone else. Summary  Dementia is a term used to describe a number of symptoms that affect memory and thinking.  Dementia usually gets worse slowly over time.  Take steps to reduce the person's risk of injury, and to plan for future care.  Caregivers need support, relief from caregiving, and time for their own lives. This information is not intended to replace advice given to you by your health care provider. Make sure you discuss any questions you have with your health care provider. Document Released: 07/18/2016 Document Revised: 07/18/2016 Document Reviewed: 07/18/2016 Elsevier Interactive Patient Education  2019 Elsevier Inc.  

## 2019-02-12 NOTE — Assessment & Plan Note (Signed)
Chronic, ongoing with BP below goal.  Continue current medication regimen and collaboration with cardiology.  Avoid hypotension d/t cognitive status and risk for falls.

## 2019-02-12 NOTE — Progress Notes (Signed)
BP 130/87   Pulse 67   Temp 98.7 F (37.1 C) (Oral)   Wt 160 lb 3.2 oz (72.7 kg)   LMP  (LMP Unknown)   SpO2 97%   BMI 30.27 kg/m    Subjective:    Patient ID: Barbara Blackburn, female    DOB: 1929/07/30, 85 y.o.   MRN: [id number]  HPI: Barbara Blackburn is a 85 y.o. female  Chief Complaint  Patient presents with  . Dementia    3 month f/up    DEMENTIA:  Chronic, progressive in nature. She had initial appointment with Dr. Manuella Ghazi on 08/16/18 and she was placed on Galantamine.  Patient and daughter deny N&V, CP, dizziness, syncope, headache, fatigue, or decreased appetite at this time.  Patient continues to eat well at home per her daughter report, no recent falls. Has history of fall last year with injury.  Her daughter would like in house PT for patient, [facility].  She lives with daughter in apartment attached to house.  Has had some weight loss over past year, but daughter weighs daily and appears to be stabilizing at this time.  Continues to have Ensure supplements throughout daytime.   ATRIAL FIBRILLATION Sees cardiology, last saw in May 2020.  Dr. Caryl Comes.  Recent labs May 2020. Atrial fibrillation status: stable Satisfied with current treatment: yes  Medication side effects:  no Medication compliance: good compliance Etiology of atrial fibrillation:  Palpitations:  no Chest pain:  no Dyspnea on exertion:  no Orthopnea:  no Syncope:  no Edema:  no Ventricular rate control: none Anti-coagulation: long acting   HYPERTENSION / HYPERLIPIDEMIA Continues on Crestor, Losartan, Lasix, and Imdur.   Satisfied with current treatment? yes Duration of hypertension: chronic BP monitoring frequency: daily BP range: 126/59, 132/98, 127/62 BP medication side effects: no Duration of hyperlipidemia: chronic Cholesterol medication side effects: no Cholesterol supplements: none Medication compliance: good compliance Aspirin: no Recent stressors: no Recurrent headaches: no  Visual changes: no Palpitations: no Dyspnea: no Chest pain: no Lower extremity edema: no Dizzy/lightheaded: no   LEFT KNEE PAIN: Chronic, had knee injection three months ago and would like another today.  Is bone on bone in knee.   Duration: chronic Involved knee: right Mechanism of injury: unknown Location:anterior Onset: gradual Severity: 7/10  Quality:  dull and aching Frequency: constant Radiation: no Aggravating factors: weight bearing, walking, bending and movement  Alleviating factors: ice and APAP  Status: fluctuating Treatments attempted: ice and APAP  Relief with NSAIDs?:  No NSAIDs Taken Weakness with weight bearing or walking: no Sensation of giving way: no Locking: no Popping: no Bruising: no Swelling: no Redness: no Paresthesias/decreased sensation: no Fevers: no  Relevant past medical, surgical, family and social history reviewed and updated as indicated. Interim medical history since our last visit reviewed. Allergies and medications reviewed and updated.  Review of Systems  Constitutional: Negative for activity change, appetite change, diaphoresis, fatigue and fever.  Respiratory: Negative for cough, chest tightness and shortness of breath.   Cardiovascular: Negative for chest pain, palpitations and leg swelling.  Gastrointestinal: Negative for abdominal distention, abdominal pain, constipation, diarrhea, nausea and vomiting.  Neurological: Negative for dizziness, syncope, weakness, light-headedness, numbness and headaches.  Psychiatric/Behavioral: Negative.     Per HPI unless specifically indicated above     Objective:    BP 130/87   Pulse 67   Temp 98.7 F (37.1 C) (Oral)   Wt 160 lb 3.2 oz (72.7 kg)   LMP  (LMP Unknown)  SpO2 97%   BMI 30.27 kg/m   Wt Readings from Last 3 Encounters:  02/12/19 160 lb 3.2 oz (72.7 kg)  01/16/19 160 lb (72.6 kg)  11/07/18 171 lb 1 oz (77.6 kg)    Physical Exam Vitals signs and nursing note reviewed.   Constitutional:      General: She is awake. She is not in acute distress.    Appearance: She is well-developed. She is not ill-appearing.  HENT:     Head: Normocephalic.     Right Ear: Hearing normal.     Left Ear: Hearing normal.     Nose: Nose normal.     Mouth/Throat:     Mouth: Mucous membranes are moist.  Eyes:     General: Lids are normal.        Right eye: No discharge.        Left eye: No discharge.     Conjunctiva/sclera: Conjunctivae normal.     Pupils: Pupils are equal, round, and reactive to light.  Neck:     Musculoskeletal: Normal range of motion and neck supple.     Thyroid: No thyromegaly.     Vascular: No carotid bruit.  Cardiovascular:     Rate and Rhythm: Normal rate and regular rhythm.     Heart sounds: Normal heart sounds. No murmur. No gallop.   Pulmonary:     Effort: Pulmonary effort is normal.     Breath sounds: Normal breath sounds.  Abdominal:     General: Bowel sounds are normal.     Palpations: Abdomen is soft. There is no hepatomegaly or splenomegaly.  Musculoskeletal:     Right knee: She exhibits decreased range of motion and swelling. She exhibits no laceration and no erythema. Tenderness found.     Left knee: She exhibits normal range of motion, no swelling, no laceration and no erythema. No tenderness found.     Right lower leg: No edema.     Left lower leg: No edema.     Comments: Right knee with mild swelling present to lateral aspect.  Tenderness with ROM and decreased ROM.  Lymphadenopathy:     Cervical: No cervical adenopathy.  Skin:    General: Skin is warm and dry.  Neurological:     Mental Status: She is alert and oriented to person, place, and time.     Comments: Walks with walker, slightly unsteady transferring from chair to walker.  Needs reminder to lock walker prior to standing or moving from chair to walker.    Psychiatric:        Attention and Perception: Attention normal.        Mood and Affect: Mood normal.         Behavior: Behavior normal. Behavior is cooperative.        Thought Content: Thought content normal.        Judgment: Judgment normal.     Comments: Pleasantly confused    STEROID INJECTION  Procedure: After verbal consent and patient ed on right knee injection.  Area prepped and draped using Betadine. semi-sterile technique.  Knee Intraarticular Steroid Injection attempted, difficult to get into space on anterior approach (same location as previous).  Patient uncomfortable and flinching, unable to perform injection.  Would benefit from lidocaine spray prior to procedure, not in house.  Applied pressure post procedure and given instructions.     Results for orders placed or performed in visit on 79/02/40  Basic metabolic panel  Result Value Ref Range  Glucose 113 (H) 65 - 99 mg/dL   BUN 20 8 - 27 mg/dL   Creatinine, Ser 0.89 0.57 - 1.00 mg/dL   GFR calc non Af Amer 58 (L) >59 mL/min/1.73   GFR calc Af Amer 67 >59 mL/min/1.73   BUN/Creatinine Ratio 22 12 - 28   Sodium 141 134 - 144 mmol/L   Potassium 4.2 3.5 - 5.2 mmol/L   Chloride 102 96 - 106 mmol/L   CO2 21 20 - 29 mmol/L   Calcium 9.3 8.7 - 10.3 mg/dL  CBC w/Diff  Result Value Ref Range   WBC 7.3 3.4 - 10.8 x10E3/uL   RBC 5.07 3.77 - 5.28 x10E6/uL   Hemoglobin 14.4 11.1 - 15.9 g/dL   Hematocrit 42.6 34.0 - 46.6 %   MCV 84 79 - 97 fL   MCH 28.4 26.6 - 33.0 pg   MCHC 33.8 31.5 - 35.7 g/dL   RDW 15.6 (H) 11.7 - 15.4 %   Platelets 182 150 - 450 x10E3/uL   Neutrophils 70 Not Estab. %   Lymphs 20 Not Estab. %   Monocytes 7 Not Estab. %   Eos 2 Not Estab. %   Basos 1 Not Estab. %   Neutrophils Absolute 5.1 1.4 - 7.0 x10E3/uL   Lymphocytes Absolute 1.4 0.7 - 3.1 x10E3/uL   Monocytes Absolute 0.5 0.1 - 0.9 x10E3/uL   EOS (ABSOLUTE) 0.1 0.0 - 0.4 x10E3/uL   Basophils Absolute 0.1 0.0 - 0.2 x10E3/uL   Immature Granulocytes 0 Not Estab. %   Immature Grans (Abs) 0.0 0.0 - 0.1 x10E3/uL      Assessment & Plan:   Problem List  Items Addressed This Visit      Cardiovascular and Mediastinum   ATRIAL FIBRILLATION    Chronic, ongoing.  Continue current medication regimen and collaboration with cardiology.        Essential hypertension, benign    Chronic, ongoing with BP below goal.  Continue current medication regimen and collaboration with cardiology.  Avoid hypotension d/t cognitive status and risk for falls.          Nervous and Auditory   Dementia ([place]) - Primary    Chronic, progressive.  Continue to monitor weight.  Home health referral for PT.  Continue Galantamine.        Relevant Orders   Ambulatory referral to [place]   Ambulatory referral to Chronic Care Management Services     Musculoskeletal and Integument   Osteoarthritis    Chronic, unable to perform knee injection.  Referral to ortho for possible needed for guided injection or use of lidocaine spray prior to injection.        Relevant Orders   Ambulatory referral to Orthopedics     Other   Hypercholesterolemia    Chronic, ongoing.  Continue current medication regimen.  Obtain lipid panel next visit.            Follow up plan: Return in about 3 months (around 05/15/2019) for Dementia, HTN/HLD.

## 2019-02-12 NOTE — Assessment & Plan Note (Signed)
Chronic, progressive.  Continue to monitor weight.  Home health referral for PT.  Continue Galantamine.

## 2019-02-12 NOTE — Assessment & Plan Note (Signed)
Chronic, unable to perform knee injection.  Referral to ortho for possible needed for guided injection or use of lidocaine spray prior to injection.

## 2019-02-13 ENCOUNTER — Ambulatory Visit (INDEPENDENT_AMBULATORY_CARE_PROVIDER_SITE_OTHER): Payer: Medicare Other | Admitting: *Deleted

## 2019-02-13 DIAGNOSIS — I5022 Chronic systolic (congestive) heart failure: Secondary | ICD-10-CM | POA: Diagnosis not present

## 2019-02-13 DIAGNOSIS — G301 Alzheimer's disease with late onset: Secondary | ICD-10-CM | POA: Diagnosis not present

## 2019-02-13 DIAGNOSIS — F0280 Dementia in other diseases classified elsewhere without behavioral disturbance: Secondary | ICD-10-CM | POA: Diagnosis not present

## 2019-02-13 NOTE — Patient Instructions (Signed)
Thank you allowing the Chronic Care Management Team to be a part of your care! It was a pleasure speaking with you today!   CCM (Chronic Care Management) Team   Jaevian Shean RN, BSN Nurse Care Coordinator  [phone]  Catie [facility] PharmD  Clinical Pharmacist  [phone]  Eula Fried LCSW Clinical Social Worker [phone]  Goals Addressed            This Visit's Progress   . Physical therepy needs (pt-stated)       Current Barriers:  [name] Knowledge Deficits related to obtaining a [place] PT   Nurse Case Manager Clinical Goal(s):  [name] Over the next 30  days, patient will work with [facility]  to address needs related to PT referral   Interventions:  . Provided education to patient re: [place], and PT referral process . Discussed plans with patient for ongoing care management follow up and provided patient with direct contact information for care management team . Provided patient and/or caregiver with the process information about [place] PT referral  (community resource). . Patient with Memory issues  . Spoke with [facility] home health per Daughter's request and they were unable to accept patient at this time. . Called daughter to let her know and daughter stated with her mom's memory issues she would rather just hold off until [facility] is available.  . Discussed further dementia resources with daughter including Museum/gallery curator.  . Emailed dementia resources to daughter  Patient Self Care Activities:  . Currently UNABLE TO independently obtain PT therapy.   Please see past updates related to this goal by clicking on the "Past Updates" button in the selected goal         The patient verbalized understanding of instructions provided today and declined a print copy of patient instruction materials.   The patient has been provided with contact information for the care management team and has been advised to call with any health related questions or concerns.     Ms. Wunschel was given information about Chronic Care Management services today including:  1. CCM service includes personalized support from designated clinical staff supervised by her physician, including individualized plan of care and coordination with other care providers 2. 24/7 contact phone numbers for assistance for urgent and routine care needs. 3. Service will only be billed when office clinical staff spend 20 minutes or more in a month to coordinate care. 4. Only one practitioner may furnish and bill the service in a calendar month. 5. The patient may stop CCM services at any time (effective at the end of the month) by phone call to the office staff. 6. The patient will be responsible for cost sharing (co-pay) of up to 20% of the service fee (after annual deductible is met).  Patient agreed to services and verbal consent obtained.

## 2019-02-13 NOTE — Chronic Care Management (AMB) (Signed)
  Chronic Care Management   Follow Up Note   02/13/2019 Name: Barbara Blackburn MRN: [id number] DOB: Feb 21, 1929  Referred by: Venita Lick, NP Reason for referral : Care Coordination [facility] services )   Barbara Blackburn is a 85 y.o. year old female who is a primary care patient of Cannady, [name], NP. The CCM team was consulted for assistance with chronic disease management and care coordination needs.    Review of patient status, including review of consultants reports, relevant laboratory and other test results, and collaboration with appropriate care team members and the patient's provider was performed as part of comprehensive patient evaluation and provision of chronic care management services.    Goals Addressed            This Visit's Progress   . Physical therepy needs (pt-stated)       Current Barriers:  [name] Knowledge Deficits related to obtaining a [place] PT   Nurse Case Manager Clinical Goal(s):  [name] Over the next 30  days, patient will work with [facility]  to address needs related to PT referral   Interventions:  . Provided education to patient re: [place], and PT referral process . Discussed plans with patient for ongoing care management follow up and provided patient with direct contact information for care management team . Provided patient and/or caregiver with the process information about [place] PT referral  (community resource). . Patient with Memory issues  . Spoke with [facility] home health per Daughter's request and they were unable to accept patient at this time. . Called daughter to let her know and daughter stated with her mom's memory issues she would rather just hold off until [facility] is available.  . Discussed further dementia resources with daughter including Museum/gallery curator.  . Emailed dementia resources to daughter  Patient Self Care Activities:  . Currently UNABLE TO independently obtain PT therapy.   Please see past updates related  to this goal by clicking on the "Past Updates" button in the selected goal          The care management team will reach out to the patient again over the next 30 days.  The patient has been provided with contact information for the care management team and has been advised to call with any health related questions or concerns.    Merlene Morse Chloeann Alfred RN, BSN Nurse Case Editor, commissioning Family Practice/[facility] Care Management  [phone]) Business Mobile

## 2019-02-13 NOTE — Patient Instructions (Signed)
Thank you allowing the Chronic Care Management Team to be a part of your care! It was a pleasure speaking with you today!   CCM (Chronic Care Management) Team   Kol Consuegra RN, BSN Nurse Care Coordinator  [phone]  Catie [facility] PharmD  Clinical Pharmacist  [phone]  Eula Fried LCSW Clinical Social Worker [phone]  Goals Addressed            This Visit's Progress   . Physical therepy needs (pt-stated)       Current Barriers:  [name] Knowledge Deficits related to obtaining a [place] PT   Nurse Case Manager Clinical Goal(s):  [name] Over the next 30  days, patient will work with [facility]  to address needs related to PT referral   Interventions:  . Provided education to patient re: [place], and PT referral process . Discussed plans with patient for ongoing care management follow up and provided patient with direct contact information for care management team . Provided patient and/or caregiver with the process information about [place] PT referral  (community resource). . Patient with Memory issues  .  Patient Self Care Activities:  . Currently UNABLE TO independently obtain PT therapy.   Initial goal documentation        The patient verbalized understanding of instructions provided today and declined a print copy of patient instruction materials.   The patient has been provided with contact information for the care management team and has been advised to call with any health related questions or concerns.

## 2019-02-17 ENCOUNTER — Ambulatory Visit (INDEPENDENT_AMBULATORY_CARE_PROVIDER_SITE_OTHER): Payer: Medicare Other

## 2019-02-17 DIAGNOSIS — I5022 Chronic systolic (congestive) heart failure: Secondary | ICD-10-CM | POA: Diagnosis not present

## 2019-02-17 DIAGNOSIS — Z95810 Presence of automatic (implantable) cardiac defibrillator: Secondary | ICD-10-CM | POA: Diagnosis not present

## 2019-02-17 DIAGNOSIS — R413 Other amnesia: Secondary | ICD-10-CM | POA: Diagnosis not present

## 2019-02-18 ENCOUNTER — Telehealth: Payer: Self-pay

## 2019-02-18 DIAGNOSIS — E119 Type 2 diabetes mellitus without complications: Secondary | ICD-10-CM | POA: Diagnosis not present

## 2019-02-18 DIAGNOSIS — M1711 Unilateral primary osteoarthritis, right knee: Secondary | ICD-10-CM | POA: Diagnosis not present

## 2019-02-18 NOTE — Telephone Encounter (Signed)
Please clarify what is needed for this patient.

## 2019-02-18 NOTE — Telephone Encounter (Signed)
Verbal given for just PT

## 2019-02-18 NOTE — Telephone Encounter (Signed)
Copied from [place]: Barbara Blackburn with [facility] called to clarify order request that was faxed over to their office. Need to know if it is for PT and Nursing or if it is for one or the other. Also if its for nursing need to specify which type of nursing is needed. Call back # [phone]

## 2019-02-18 NOTE — Telephone Encounter (Signed)
Just needs PT if they will do this.

## 2019-02-19 NOTE — Progress Notes (Signed)
EPIC Encounter for ICM Monitoring  Patient Name: Barbara Blackburn is a 85 y.o. female Date: 02/19/2019 Primary Care Physican: Venita Lick, NP Primary Cardiologist:Gollan Electrophysiologist:Klein 01/15/2019 Weight: 163.4 lbs 02/19/2019 Weight: 160-162 lbs   Spoke with [name], [place]. Heart Failure questions reviewed.Pt isasymptomaticand has not complaints.  OptivolThoracic impedancenormal.  Prescribed: Furosemide40 mg take2tablets (80 mg total) every morning and 1 tablet (40 mg total) every evening (increased 11/18/2018).  Labs: 11/07/2018 Creatinine0.78[name], Potassium3.9, [id number], OEC95-07 09/05/2018 Creatinine0.81, BUN16, Potassium4.0, [id number], [id number]  05/07/2019Creatinine 0.72, BUN22, Potassium3.6, Sodium139, GFR>60 A complete set of results can be found in Results Review.  Recommendations:No changes and encouraged to call for any fluid symptoms.  Follow-up plan: ICM clinic phone appointment7/28/2020.    3 month ICM trend: 02/17/2019    1 Year ICM trend:       [name], RN 02/19/2019 8:54 AM

## 2019-02-24 ENCOUNTER — Other Ambulatory Visit: Payer: Self-pay | Admitting: Nurse Practitioner

## 2019-02-24 ENCOUNTER — Telehealth: Payer: Self-pay | Admitting: Nurse Practitioner

## 2019-02-24 NOTE — Telephone Encounter (Signed)
Noted, thank you

## 2019-02-24 NOTE — Telephone Encounter (Signed)
Jenny Reichmann called from [facility] stating patient did not want to be seen on her first home visit on 6/28, Patient new start date will be 7/2.   Jenny Reichmann called back [phone]

## 2019-02-27 DIAGNOSIS — D649 Anemia, unspecified: Secondary | ICD-10-CM | POA: Diagnosis not present

## 2019-02-27 DIAGNOSIS — I509 Heart failure, unspecified: Secondary | ICD-10-CM | POA: Diagnosis not present

## 2019-02-27 DIAGNOSIS — G301 Alzheimer's disease with late onset: Secondary | ICD-10-CM | POA: Diagnosis not present

## 2019-02-27 DIAGNOSIS — I110 Hypertensive heart disease with heart failure: Secondary | ICD-10-CM | POA: Diagnosis not present

## 2019-02-27 DIAGNOSIS — Z9181 History of falling: Secondary | ICD-10-CM | POA: Diagnosis not present

## 2019-02-27 DIAGNOSIS — M150 Primary generalized (osteo)arthritis: Secondary | ICD-10-CM | POA: Diagnosis not present

## 2019-02-27 DIAGNOSIS — E785 Hyperlipidemia, unspecified: Secondary | ICD-10-CM | POA: Diagnosis not present

## 2019-02-27 DIAGNOSIS — I4891 Unspecified atrial fibrillation: Secondary | ICD-10-CM | POA: Diagnosis not present

## 2019-02-27 DIAGNOSIS — Z7901 Long term (current) use of anticoagulants: Secondary | ICD-10-CM | POA: Diagnosis not present

## 2019-03-03 DIAGNOSIS — Z7901 Long term (current) use of anticoagulants: Secondary | ICD-10-CM | POA: Diagnosis not present

## 2019-03-03 DIAGNOSIS — G301 Alzheimer's disease with late onset: Secondary | ICD-10-CM | POA: Diagnosis not present

## 2019-03-03 DIAGNOSIS — M150 Primary generalized (osteo)arthritis: Secondary | ICD-10-CM | POA: Diagnosis not present

## 2019-03-03 DIAGNOSIS — I110 Hypertensive heart disease with heart failure: Secondary | ICD-10-CM | POA: Diagnosis not present

## 2019-03-03 DIAGNOSIS — Z9181 History of falling: Secondary | ICD-10-CM | POA: Diagnosis not present

## 2019-03-03 DIAGNOSIS — I509 Heart failure, unspecified: Secondary | ICD-10-CM | POA: Diagnosis not present

## 2019-03-03 DIAGNOSIS — E785 Hyperlipidemia, unspecified: Secondary | ICD-10-CM | POA: Diagnosis not present

## 2019-03-03 DIAGNOSIS — D649 Anemia, unspecified: Secondary | ICD-10-CM | POA: Diagnosis not present

## 2019-03-03 DIAGNOSIS — I4891 Unspecified atrial fibrillation: Secondary | ICD-10-CM | POA: Diagnosis not present

## 2019-03-05 DIAGNOSIS — Z7901 Long term (current) use of anticoagulants: Secondary | ICD-10-CM | POA: Diagnosis not present

## 2019-03-05 DIAGNOSIS — I509 Heart failure, unspecified: Secondary | ICD-10-CM | POA: Diagnosis not present

## 2019-03-05 DIAGNOSIS — I110 Hypertensive heart disease with heart failure: Secondary | ICD-10-CM | POA: Diagnosis not present

## 2019-03-05 DIAGNOSIS — I4891 Unspecified atrial fibrillation: Secondary | ICD-10-CM | POA: Diagnosis not present

## 2019-03-05 DIAGNOSIS — E785 Hyperlipidemia, unspecified: Secondary | ICD-10-CM | POA: Diagnosis not present

## 2019-03-05 DIAGNOSIS — G301 Alzheimer's disease with late onset: Secondary | ICD-10-CM | POA: Diagnosis not present

## 2019-03-05 DIAGNOSIS — D649 Anemia, unspecified: Secondary | ICD-10-CM | POA: Diagnosis not present

## 2019-03-05 DIAGNOSIS — Z9181 History of falling: Secondary | ICD-10-CM | POA: Diagnosis not present

## 2019-03-05 DIAGNOSIS — M150 Primary generalized (osteo)arthritis: Secondary | ICD-10-CM | POA: Diagnosis not present

## 2019-03-10 DIAGNOSIS — I4891 Unspecified atrial fibrillation: Secondary | ICD-10-CM | POA: Diagnosis not present

## 2019-03-10 DIAGNOSIS — Z9181 History of falling: Secondary | ICD-10-CM | POA: Diagnosis not present

## 2019-03-10 DIAGNOSIS — G301 Alzheimer's disease with late onset: Secondary | ICD-10-CM | POA: Diagnosis not present

## 2019-03-10 DIAGNOSIS — Z7901 Long term (current) use of anticoagulants: Secondary | ICD-10-CM | POA: Diagnosis not present

## 2019-03-10 DIAGNOSIS — M150 Primary generalized (osteo)arthritis: Secondary | ICD-10-CM | POA: Diagnosis not present

## 2019-03-10 DIAGNOSIS — I110 Hypertensive heart disease with heart failure: Secondary | ICD-10-CM | POA: Diagnosis not present

## 2019-03-10 DIAGNOSIS — E785 Hyperlipidemia, unspecified: Secondary | ICD-10-CM | POA: Diagnosis not present

## 2019-03-10 DIAGNOSIS — D649 Anemia, unspecified: Secondary | ICD-10-CM | POA: Diagnosis not present

## 2019-03-10 DIAGNOSIS — I509 Heart failure, unspecified: Secondary | ICD-10-CM | POA: Diagnosis not present

## 2019-03-11 ENCOUNTER — Ambulatory Visit (INDEPENDENT_AMBULATORY_CARE_PROVIDER_SITE_OTHER): Payer: Medicare Other | Admitting: *Deleted

## 2019-03-11 DIAGNOSIS — F0280 Dementia in other diseases classified elsewhere without behavioral disturbance: Secondary | ICD-10-CM | POA: Diagnosis not present

## 2019-03-11 DIAGNOSIS — G301 Alzheimer's disease with late onset: Secondary | ICD-10-CM | POA: Diagnosis not present

## 2019-03-11 DIAGNOSIS — F0390 Unspecified dementia without behavioral disturbance: Secondary | ICD-10-CM | POA: Diagnosis not present

## 2019-03-11 NOTE — Chronic Care Management (AMB) (Signed)
  Chronic Care Management   Follow Up Note   03/11/2019 Name: Barbara Blackburn MRN: [id number] DOB: [birth year]  Referred by: Venita Lick, NP Reason for referral : Chronic Care Management (Dementia/Caregiver f/u )   Barbara Blackburn is a 85 y.o. year old female who is a primary care patient of Cannady, [name], NP. The CCM team was consulted for assistance with chronic disease management and care coordination needs.    Review of patient status, including review of consultants reports, relevant laboratory and other test results, and collaboration with appropriate care team members and the patient's provider was performed as part of comprehensive patient evaluation and provision of chronic care management services.    Goals Addressed            This Visit's Progress   . Physical therepy needs (pt-stated)       Current Barriers:  Barbara Blackburn Knowledge Deficits related to obtaining a [place] PT   Nurse Case Manager Clinical Goal(s):  Barbara Blackburn Over the next 30  days, patient will work with Barbara Blackburn  to address needs related to PT referral   Interventions:  . Reviewed medications with patient and discussed Patient's recent trial of Namenda started by Neuro but daughter/Caregiver Barbara Blackburn reports patient's mood was angry/agitated on the medication and patient was unable to tolerate it. Also discussed Neuro started patient on Buspar prn for anxiety. Daughter stated patient took this and the whole dose was "too much" for her, and made her too "dulled out" Daughter stated she plans to try half the dose. Also talked with daughter about a pharmacy consult to brainstorm other medication ideas.  . Discussed plans with patient for ongoing care management follow up and provided patient with direct contact information for care management team . Provided patient and/or caregiver with resource information about care-giving and dementia/alzheimer's   (community resource). . Pharmacy referral for medications to  assist patient with memory and agitation  . Patient with Memory issues, daughter the main caregiver. Daughter does state she is taking a small break this week end and recognizes the importance of caring for herself.  . Patient has started [facility] PT through [facility] and daughter reports this has been going very well. Daughter reports patient was able to obtain a steroid injection in her knee that was helpful for about a week but now they are using ice and salon pas patches related to ongoing pain.   Patient Self Care Activities:  . Currently UNABLE TO independently obtain PT therapy.   Please see past updates related to this goal by clicking on the "Past Updates" button in the selected goal          The care management team will reach out to the patient again over the next 30 days.  The patient has been provided with contact information for the care management team and has been advised to call with any health related questions or concerns.    Barbara Blackburn Barbara Hartung RN, BSN Nurse Case Editor, commissioning Family Practice/[facility] Care Management  [phone]) Business Mobile

## 2019-03-11 NOTE — Patient Instructions (Signed)
Thank you allowing the Chronic Care Management Team to be a part of your care! It was a pleasure speaking with you today!  CCM (Chronic Care Management) Team   Aminata Buffalo RN, BSN Nurse Care Coordinator  [phone]  Catie [facility] PharmD  Clinical Pharmacist  [phone]  Eula Fried LCSW Clinical Social Worker [phone]  Goals Addressed            This Visit's Progress   . Physical therepy needs (pt-stated)       Current Barriers:  [name] Knowledge Deficits related to obtaining a [place] PT   Nurse Case Manager Clinical Goal(s):  [name] Over the next 30  days, patient will work with [facility]  to address needs related to PT referral   Interventions:  . Reviewed medications with patient and discussed Patient's recent trial of Namenda started by Neuro but daughter/Caregiver [name] reports patient's mood was angry/agitated on the medication and she was unable to tolerate it. Also discussed Neuro started patient on Buspar prn for anxiety. Daughter stated patient took this and the whole dose was "too much" for her, and made her too "dulled out" Daughter stated she plans to try half the dose. Also talked with daughter about a pharmacy consult to brainstorm other medication ideas.  . Discussed plans with patient for ongoing care management follow up and provided patient with direct contact information for care management team . Provided patient and/or caregiver with resource information about care-giving and dementia/alzheimer's   (community resource). . Pharmacy referral for medications to assist patient with memory and agitation  . Patient with Memory issues, daughter the main caregiver. Daughter does state she is taking a small break this week end and recognizes the importance of caring for herself.  . Patient has started [facility] PT through [facility] and daughter reports this has been going very well. Daughter reports patient was able to obtain a steroid injection in her knee that was  helpful for about a week but now they are using ice and salon pas patches related to ongoing pain.   Patient Self Care Activities:  . Currently UNABLE TO independently obtain PT therapy.   Please see past updates related to this goal by clicking on the "Past Updates" button in the selected goal         The patient verbalized understanding of instructions provided today and declined a print copy of patient instruction materials.   The patient has been provided with contact information for the care management team and has been advised to call with any health related questions or concerns.

## 2019-03-12 ENCOUNTER — Telehealth: Payer: Self-pay

## 2019-03-12 ENCOUNTER — Telehealth: Payer: Self-pay | Admitting: Nurse Practitioner

## 2019-03-12 DIAGNOSIS — I4891 Unspecified atrial fibrillation: Secondary | ICD-10-CM | POA: Diagnosis not present

## 2019-03-12 DIAGNOSIS — M150 Primary generalized (osteo)arthritis: Secondary | ICD-10-CM | POA: Diagnosis not present

## 2019-03-12 DIAGNOSIS — D649 Anemia, unspecified: Secondary | ICD-10-CM | POA: Diagnosis not present

## 2019-03-12 DIAGNOSIS — E785 Hyperlipidemia, unspecified: Secondary | ICD-10-CM | POA: Diagnosis not present

## 2019-03-12 DIAGNOSIS — I509 Heart failure, unspecified: Secondary | ICD-10-CM | POA: Diagnosis not present

## 2019-03-12 DIAGNOSIS — Z7901 Long term (current) use of anticoagulants: Secondary | ICD-10-CM | POA: Diagnosis not present

## 2019-03-12 DIAGNOSIS — G301 Alzheimer's disease with late onset: Secondary | ICD-10-CM | POA: Diagnosis not present

## 2019-03-12 DIAGNOSIS — I110 Hypertensive heart disease with heart failure: Secondary | ICD-10-CM | POA: Diagnosis not present

## 2019-03-12 DIAGNOSIS — Z9181 History of falling: Secondary | ICD-10-CM | POA: Diagnosis not present

## 2019-03-12 NOTE — Telephone Encounter (Signed)
Spoke to Anthony on telephone.  He reports patient and her daughter did not want to be seen at this time, but he did tell them if any increased pain or changes to be seen by provider which they agreed with.

## 2019-03-12 NOTE — Telephone Encounter (Signed)
Copied from [place]. Topic: General - Inquiry >> Mar 12, 2019  3:13 PM Richardo Priest, [place] wrote: Reason for CRM: Lennette Bihari, PT, called in to inform office that today while he was doing PT, patient went to the bathroom and on the way back to living room fell. Patient has a slight abrasion on right knee and small hematoma on left side of chest. Lennette Bihari took vitals and patient is right above her borderline 90/50, being 95/56. Lennette Bihari wanted to inform office of the changes and to contact him at [phone] if they have any questions.

## 2019-03-21 DIAGNOSIS — I110 Hypertensive heart disease with heart failure: Secondary | ICD-10-CM | POA: Diagnosis not present

## 2019-03-21 DIAGNOSIS — E785 Hyperlipidemia, unspecified: Secondary | ICD-10-CM | POA: Diagnosis not present

## 2019-03-21 DIAGNOSIS — M150 Primary generalized (osteo)arthritis: Secondary | ICD-10-CM | POA: Diagnosis not present

## 2019-03-21 DIAGNOSIS — G301 Alzheimer's disease with late onset: Secondary | ICD-10-CM | POA: Diagnosis not present

## 2019-03-21 DIAGNOSIS — Z7901 Long term (current) use of anticoagulants: Secondary | ICD-10-CM | POA: Diagnosis not present

## 2019-03-21 DIAGNOSIS — I509 Heart failure, unspecified: Secondary | ICD-10-CM | POA: Diagnosis not present

## 2019-03-21 DIAGNOSIS — I4891 Unspecified atrial fibrillation: Secondary | ICD-10-CM | POA: Diagnosis not present

## 2019-03-21 DIAGNOSIS — Z9181 History of falling: Secondary | ICD-10-CM | POA: Diagnosis not present

## 2019-03-21 DIAGNOSIS — D649 Anemia, unspecified: Secondary | ICD-10-CM | POA: Diagnosis not present

## 2019-03-24 ENCOUNTER — Ambulatory Visit (INDEPENDENT_AMBULATORY_CARE_PROVIDER_SITE_OTHER): Payer: Medicare Other | Admitting: *Deleted

## 2019-03-24 DIAGNOSIS — I110 Hypertensive heart disease with heart failure: Secondary | ICD-10-CM | POA: Diagnosis not present

## 2019-03-24 DIAGNOSIS — I509 Heart failure, unspecified: Secondary | ICD-10-CM | POA: Diagnosis not present

## 2019-03-24 DIAGNOSIS — D649 Anemia, unspecified: Secondary | ICD-10-CM | POA: Diagnosis not present

## 2019-03-24 DIAGNOSIS — I4891 Unspecified atrial fibrillation: Secondary | ICD-10-CM | POA: Diagnosis not present

## 2019-03-24 DIAGNOSIS — Z7901 Long term (current) use of anticoagulants: Secondary | ICD-10-CM | POA: Diagnosis not present

## 2019-03-24 DIAGNOSIS — M150 Primary generalized (osteo)arthritis: Secondary | ICD-10-CM | POA: Diagnosis not present

## 2019-03-24 DIAGNOSIS — I5022 Chronic systolic (congestive) heart failure: Secondary | ICD-10-CM

## 2019-03-24 DIAGNOSIS — E785 Hyperlipidemia, unspecified: Secondary | ICD-10-CM | POA: Diagnosis not present

## 2019-03-24 DIAGNOSIS — G301 Alzheimer's disease with late onset: Secondary | ICD-10-CM | POA: Diagnosis not present

## 2019-03-24 DIAGNOSIS — Z9181 History of falling: Secondary | ICD-10-CM | POA: Diagnosis not present

## 2019-03-24 DIAGNOSIS — I4821 Permanent atrial fibrillation: Secondary | ICD-10-CM

## 2019-03-24 LAB — CUP PACEART REMOTE DEVICE CHECK
Battery Remaining Longevity: 102 mo
Battery Voltage: 3.01 V
Brady Statistic AP VP Percent: 0 %
Brady Statistic AP VS Percent: 0 %
Brady Statistic AS VP Percent: 0 %
Brady Statistic AS VS Percent: 0 %
Brady Statistic RA Percent Paced: 0 %
Brady Statistic RV Percent Paced: 4.17 %
Date Time Interrogation Session: 20200727083723
HighPow Impedance: 45 Ohm
HighPow Impedance: 54 Ohm
Implantable Lead Implant Date: 20040220
Implantable Lead Implant Date: 20070426
Implantable Lead Implant Date: 20070511
Implantable Lead Location: 753859
Implantable Lead Location: 753860
Implantable Lead Location: 753860
Implantable Lead Model: 5076
Implantable Lead Model: 5092
Implantable Lead Model: 6949
Implantable Pulse Generator Implant Date: 20180411
Lead Channel Impedance Value: 399 Ohm
Lead Channel Impedance Value: 399 Ohm
Lead Channel Impedance Value: 456 Ohm
Lead Channel Pacing Threshold Amplitude: 0.5 V
Lead Channel Pacing Threshold Pulse Width: 0.4 ms
Lead Channel Sensing Intrinsic Amplitude: 0.375 mV
Lead Channel Sensing Intrinsic Amplitude: 0.75 mV
Lead Channel Sensing Intrinsic Amplitude: 2.25 mV
Lead Channel Sensing Intrinsic Amplitude: 2.25 mV
Lead Channel Setting Pacing Amplitude: 2.5 V
Lead Channel Setting Pacing Pulse Width: 0.4 ms
Lead Channel Setting Sensing Sensitivity: 0.3 mV

## 2019-03-25 ENCOUNTER — Ambulatory Visit (INDEPENDENT_AMBULATORY_CARE_PROVIDER_SITE_OTHER): Payer: Medicare Other

## 2019-03-25 DIAGNOSIS — Z95810 Presence of automatic (implantable) cardiac defibrillator: Secondary | ICD-10-CM

## 2019-03-25 DIAGNOSIS — I5022 Chronic systolic (congestive) heart failure: Secondary | ICD-10-CM | POA: Diagnosis not present

## 2019-03-26 DIAGNOSIS — I509 Heart failure, unspecified: Secondary | ICD-10-CM | POA: Diagnosis not present

## 2019-03-26 DIAGNOSIS — G301 Alzheimer's disease with late onset: Secondary | ICD-10-CM | POA: Diagnosis not present

## 2019-03-26 DIAGNOSIS — E785 Hyperlipidemia, unspecified: Secondary | ICD-10-CM | POA: Diagnosis not present

## 2019-03-26 DIAGNOSIS — Z9181 History of falling: Secondary | ICD-10-CM | POA: Diagnosis not present

## 2019-03-26 DIAGNOSIS — I110 Hypertensive heart disease with heart failure: Secondary | ICD-10-CM | POA: Diagnosis not present

## 2019-03-26 DIAGNOSIS — Z7901 Long term (current) use of anticoagulants: Secondary | ICD-10-CM | POA: Diagnosis not present

## 2019-03-26 DIAGNOSIS — M150 Primary generalized (osteo)arthritis: Secondary | ICD-10-CM | POA: Diagnosis not present

## 2019-03-26 DIAGNOSIS — I4891 Unspecified atrial fibrillation: Secondary | ICD-10-CM | POA: Diagnosis not present

## 2019-03-26 DIAGNOSIS — D649 Anemia, unspecified: Secondary | ICD-10-CM | POA: Diagnosis not present

## 2019-03-26 NOTE — Progress Notes (Signed)
EPIC Encounter for ICM Monitoring  Patient Name: Barbara Blackburn is a 85 y.o. female Date: 03/26/2019 Primary Care Physican: Venita Lick, NP Primary Cardiologist:Gollan Electrophysiologist:Klein 02/19/2019 Weight: 160-162 lbs 03/26/2019 Weight: 161.2 lbs  Since 02/17/2019 VT-NS (>4 beats, >171 bpm)   16  Since 03/24/2019 VT-NS (>4 beats, >171 bpm)   14   Spoke with daughter Barbara Blackburn, [place]. Heart Failure questions reviewed. She said she was out of town for about a week and other family members stayed with patient.  She said it is possible that patient ate foods that were higher in salt than what she normally gives the patient.   OptivolThoracic impedancesuggesting fluid accumulation since 03/13/2019.  Prescribed: Furosemide40 mg Take 3 tablets (120 mg) by mouth once every other day in the morning.    Labs: 01/16/2019 Creatinine 0.89, BUN 20, Potassium 4.2, Sodium 141, GFR 58-67 11/07/2018 Creatinine0.78, BUN23, Potassium3.9, [id number], HDQ22-29 A complete set of results can be found in Results Review.  Recommendations: Advised to take 1 tablet of 40 mg Furosemide on the office diuretic day for one dose day only.  She should continue prescribed dosage.   Follow-up plan: ICM clinic phone appointment8/11/2018 to recheck fluid levels.  Office visit 04/15/2019 with Dr Caryl Comes.  Copy of ICM check sent to Dr. Caryl Comes and Dr Rockey Situ.   3 month ICM trend: 03/25/2019    1 Year ICM trend:       Barbara Billings, RN 03/26/2019 12:11 PM

## 2019-03-31 DIAGNOSIS — I509 Heart failure, unspecified: Secondary | ICD-10-CM | POA: Diagnosis not present

## 2019-03-31 DIAGNOSIS — Z7901 Long term (current) use of anticoagulants: Secondary | ICD-10-CM | POA: Diagnosis not present

## 2019-03-31 DIAGNOSIS — M150 Primary generalized (osteo)arthritis: Secondary | ICD-10-CM | POA: Diagnosis not present

## 2019-03-31 DIAGNOSIS — I110 Hypertensive heart disease with heart failure: Secondary | ICD-10-CM | POA: Diagnosis not present

## 2019-03-31 DIAGNOSIS — D649 Anemia, unspecified: Secondary | ICD-10-CM | POA: Diagnosis not present

## 2019-03-31 DIAGNOSIS — E785 Hyperlipidemia, unspecified: Secondary | ICD-10-CM | POA: Diagnosis not present

## 2019-03-31 DIAGNOSIS — Z9181 History of falling: Secondary | ICD-10-CM | POA: Diagnosis not present

## 2019-03-31 DIAGNOSIS — G301 Alzheimer's disease with late onset: Secondary | ICD-10-CM | POA: Diagnosis not present

## 2019-03-31 DIAGNOSIS — I4891 Unspecified atrial fibrillation: Secondary | ICD-10-CM | POA: Diagnosis not present

## 2019-04-01 ENCOUNTER — Ambulatory Visit (INDEPENDENT_AMBULATORY_CARE_PROVIDER_SITE_OTHER): Payer: Medicare Other

## 2019-04-01 DIAGNOSIS — Z95810 Presence of automatic (implantable) cardiac defibrillator: Secondary | ICD-10-CM

## 2019-04-01 DIAGNOSIS — I5022 Chronic systolic (congestive) heart failure: Secondary | ICD-10-CM

## 2019-04-02 ENCOUNTER — Telehealth: Payer: Self-pay

## 2019-04-02 DIAGNOSIS — I509 Heart failure, unspecified: Secondary | ICD-10-CM | POA: Diagnosis not present

## 2019-04-02 DIAGNOSIS — D649 Anemia, unspecified: Secondary | ICD-10-CM | POA: Diagnosis not present

## 2019-04-02 DIAGNOSIS — M150 Primary generalized (osteo)arthritis: Secondary | ICD-10-CM | POA: Diagnosis not present

## 2019-04-02 DIAGNOSIS — G301 Alzheimer's disease with late onset: Secondary | ICD-10-CM | POA: Diagnosis not present

## 2019-04-02 DIAGNOSIS — Z7901 Long term (current) use of anticoagulants: Secondary | ICD-10-CM | POA: Diagnosis not present

## 2019-04-02 DIAGNOSIS — I110 Hypertensive heart disease with heart failure: Secondary | ICD-10-CM | POA: Diagnosis not present

## 2019-04-02 DIAGNOSIS — I4891 Unspecified atrial fibrillation: Secondary | ICD-10-CM | POA: Diagnosis not present

## 2019-04-02 DIAGNOSIS — Z9181 History of falling: Secondary | ICD-10-CM | POA: Diagnosis not present

## 2019-04-02 DIAGNOSIS — E785 Hyperlipidemia, unspecified: Secondary | ICD-10-CM | POA: Diagnosis not present

## 2019-04-02 NOTE — Progress Notes (Signed)
EPIC Encounter for ICM Monitoring  Patient Name: Barbara Blackburn is a 85 y.o. female Date: 04/02/2019 Primary Care Physican: Venita Lick, NP Primary Cardiologist:Gollan Electrophysiologist:Klein 02/19/2019 Weight: 160-162 lbs 03/26/2019 Weight: 162.2 lbs 04/01/2019 Weight: 166 lbs        Spoke with daughter Barbara Blackburn, [place]. Heart Failure questions reviewed. Weight has increased 3.8 lbs since 7/29.    OptivolThoracic impedancesuggesting possible ongoing fluid accumulation since 03/13/2019.  Prescribed: Furosemide40 mg Take 3 tablets (120 mg) by mouth once every other day in the morning.    Labs: 01/16/2019 Creatinine 0.89, BUN 20, Potassium 4.2, Sodium 141, GFR 58-67 11/07/2018 Creatinine0.78, BUN23, Potassium3.9, [id number], HUD14-97 A complete set of results can be found in Results Review.  Recommendations: Advised to take Furosemide 2 tablets tomorrow and then resume 120 mg every other day.    Follow-up plan: ICM clinic phone appointment8/02/2019 to recheck fluid levels.  Office visit 04/15/2019 with Dr Caryl Comes.  Copy of ICM check sent to Dr. Caryl Comes and Dr Rockey Situ.   3 month ICM trend: 04/01/2019    1 Year ICM trend:       [name], RN 04/02/2019 12:47 PM

## 2019-04-04 ENCOUNTER — Ambulatory Visit (INDEPENDENT_AMBULATORY_CARE_PROVIDER_SITE_OTHER): Payer: Medicare Other

## 2019-04-04 DIAGNOSIS — I5022 Chronic systolic (congestive) heart failure: Secondary | ICD-10-CM

## 2019-04-04 DIAGNOSIS — Z95810 Presence of automatic (implantable) cardiac defibrillator: Secondary | ICD-10-CM

## 2019-04-04 NOTE — Progress Notes (Signed)
EPIC Encounter for ICM Monitoring  Patient Name: Barbara Blackburn is a 85 y.o. female Date: 04/04/2019 Primary Care Physican: Venita Lick, NP Primary Cardiologist:Gollan Electrophysiologist:Klein 02/19/2019 Weight: 160-162 lbs (baseline) 03/26/2019 Weight: 162.2 lbs 04/01/2019 Weight: 166 lbs 04/04/2019 Weight: 164.8 lbs        Spoke with daughter Barbara Blackburn, [place]. Heart Failure questions reviewed. Weight decreased after taking extra 40 mg of Lasix.  Urine output is good.   OptivolThoracic impedanceslightly improved after taking extra Lasix but continues to suggest possible ongoing fluid accumulation since 03/13/2019.  Prescribed: Furosemide40 mgTake 3 tablets (120 mg) by mouth once every other day in the morning.  Labs: 01/16/2019 Creatinine 0.89, BUN 20, Potassium 4.2, Sodium 141, GFR 58-67 11/07/2018 Creatinine0.78, BUN23, Potassium3.9, [id number], JHE17-40 A complete set of results can be found in Results Review.  Recommendations:Advised to call if weight increases or she develops other fluid symptoms.    Follow-up plan: ICM clinic phone appointment8/25/2020to recheck fluid levels.Office visit 04/15/2019 with Dr Caryl Comes.  Copy of ICM check sent to Dr.Klein and Dr Rockey Situ.   3 month ICM trend: 04/04/2019    1 Year ICM trend:       Barbara Billings, RN 04/04/2019 11:49 AM

## 2019-04-06 DIAGNOSIS — Z9181 History of falling: Secondary | ICD-10-CM | POA: Diagnosis not present

## 2019-04-06 DIAGNOSIS — E785 Hyperlipidemia, unspecified: Secondary | ICD-10-CM | POA: Diagnosis not present

## 2019-04-06 DIAGNOSIS — I4891 Unspecified atrial fibrillation: Secondary | ICD-10-CM | POA: Diagnosis not present

## 2019-04-06 DIAGNOSIS — D649 Anemia, unspecified: Secondary | ICD-10-CM | POA: Diagnosis not present

## 2019-04-06 DIAGNOSIS — Z7901 Long term (current) use of anticoagulants: Secondary | ICD-10-CM | POA: Diagnosis not present

## 2019-04-06 DIAGNOSIS — I110 Hypertensive heart disease with heart failure: Secondary | ICD-10-CM | POA: Diagnosis not present

## 2019-04-06 DIAGNOSIS — G301 Alzheimer's disease with late onset: Secondary | ICD-10-CM | POA: Diagnosis not present

## 2019-04-06 DIAGNOSIS — M150 Primary generalized (osteo)arthritis: Secondary | ICD-10-CM | POA: Diagnosis not present

## 2019-04-06 DIAGNOSIS — I509 Heart failure, unspecified: Secondary | ICD-10-CM | POA: Diagnosis not present

## 2019-04-07 ENCOUNTER — Ambulatory Visit (INDEPENDENT_AMBULATORY_CARE_PROVIDER_SITE_OTHER): Payer: Medicare Other

## 2019-04-07 VITALS — BP 126/65 | HR 72 | Temp 98.4°F | Ht 61.0 in | Wt 165.0 lb

## 2019-04-07 DIAGNOSIS — Z0000 Encounter for general adult medical examination without abnormal findings: Secondary | ICD-10-CM | POA: Diagnosis not present

## 2019-04-07 NOTE — Progress Notes (Signed)
Remote ICD transmission.   

## 2019-04-07 NOTE — Progress Notes (Signed)
Subjective:   Barbara Blackburn is a 85 y.o. female who presents for Medicare Annual (Subsequent) preventive examination.   This visit is being conducted via phone call  - after an attmept to do on video chat - due to the COVID-19 pandemic. This patient has given me verbal consent via phone to conduct this visit, patient states they are participating from their home address. Some vital signs may be absent or patient reported.   Patient identification: identified by name, DOB, and current address.    Review of Systems:        Objective:     Vitals: BP 126/65 Comment: pt reported  Pulse 72 Comment: pt reported  Temp 98.4 F (36.9 C) Comment: pt reported  Ht 5\' 1"  (1.549 m) Comment: pt reported  Wt 165 lb (74.8 kg) Comment: pt reported  LMP  (LMP Unknown)   SpO2 95% Comment: pt reported  BMI 31.18 kg/m   Body mass index is 31.18 kg/m.  Advanced Directives 04/07/2019 03/20/2018 03/14/2017 12/06/2016 06/13/2016 03/03/2016 02/14/2016  Does Patient Have a Medical Advance Directive? Yes Yes Yes Yes Yes Yes Yes  Type of Advance Directive Living will;Healthcare Power of Attorney Living will;Healthcare Power of [place];Living will Healthcare Power of Attorney Living will;Out of facility DNR (pink MOST or yellow form);Healthcare Power of [place];Living will Out of facility DNR (pink MOST or yellow form);[place];Living will  Does patient want to make changes to medical advance directive? - - - No - Patient declined - - No - Patient declined  Copy of [place] in Chart? Yes - validated most recent copy scanned in chart (See row information) No - copy requested Yes No - copy requested Yes - -  Pre-existing out of facility DNR order (yellow form or pink MOST form) - - - - - - Yellow form placed in chart (order not valid for inpatient use)    Tobacco Social History   Tobacco Use  Smoking Status  Never Smoker  Smokeless Tobacco Never Used     Counseling given: Not Answered   Clinical Intake:  Pre-visit preparation completed: Yes  Pain : No/denies pain     Nutritional Risks: None Diabetes: No  How often do you need to have someone help you when you read instructions, pamphlets, or other written materials from your doctor or pharmacy?: 1 - Never  Interpreter Needed?: No  Information entered by :: Shenice Dolder,LPN  Past Medical History:  Diagnosis Date  . 6949-lead 11/11/2013  . Allergy   . CHF (congestive heart failure) (HCC)    class 2  . Chronic atrial fibrillation   . Coronary artery disease   . Degenerative joint disease    severe  . Dyslipidemia   . Hyperlipidemia   . ICD-Medtronic 05/10/2009   Qualifier: Diagnosis of  By: Lovena Le, MD, [facility], Binnie Kand   . Nonischemic cardiomyopathy [facility])    Past Surgical History:  Procedure Laterality Date  . ICD GENERATOR CHANGEOUT N/A 12/06/2016   Procedure: ICD Generator Changeout;  Surgeon: Deboraha Sprang, MD;  Location: [facility];  Service: Cardiovascular;  Laterality: N/A;  . Medtronic dual-chamber ICD  01/05/2006   generator change April 2012  . PCI stent    . REPLACEMENT TOTAL KNEE     Family History  Problem Relation Age of Onset  . Heart attack Father   . Cancer Mother        colon  .  Hypertension Sister   . Aneurysm Sister   . Aneurysm Maternal Grandmother   . Cancer Sister        rectal   Social History   Socioeconomic History  . Marital status: Widowed    Spouse name: Not on file  . Number of children: Not on file  . Years of education: Not on file  . Highest education level: High school graduate  Occupational History  . Not on file  Social Needs  . Financial resource strain: Not hard at all  . Food insecurity    Worry: Never true    Inability: Never true  . Transportation needs    Medical: No    Non-medical: No  Tobacco Use  . Smoking status: Never Smoker  . Smokeless  tobacco: Never Used  Substance and Sexual Activity  . Alcohol use: No  . Drug use: No  . Sexual activity: Never  Lifestyle  . Physical activity    Days per week: 0 days    Minutes per session: 0 min  . Stress: Not at all  Relationships  . Social connections    Talks on phone: More than three times a week    Gets together: More than three times a week    Attends religious service: Never    Active member of club or organization: No    Attends meetings of clubs or organizations: Never    Relationship status: Widowed  Other Topics Concern  . Not on file  Social History Narrative  . Not on file    Outpatient Encounter Medications as of 04/07/2019  Medication Sig  . Cholecalciferol (VITAMIN D) 2000 units tablet Take 2,000 Units by mouth daily.  [name] ENSURE PLUS (ENSURE PLUS) LIQD Take 237 mLs by mouth.  . furosemide (LASIX) 40 MG tablet Take 3 tablets (120 mg) by mouth once every other day in the morning  . galantamine (RAZADYNE) 4 MG tablet Take 1 tablet (4 mg total) by mouth 2 (two) times daily with a meal.  . isosorbide mononitrate (IMDUR) 30 MG 24 hr tablet Take 1 tablet (30 mg total) by mouth daily.  [name] losartan (COZAAR) 50 MG tablet Take 1 tablet (50 mg total) by mouth daily.  . Multiple Vitamin (MULTIVITAMIN) capsule Take 1 capsule by mouth daily.  . rivaroxaban (XARELTO) 20 MG TABS tablet TAKE 1 TABLET BY MOUTH  DAILY WITH SUPPER  . rosuvastatin (CRESTOR) 10 MG tablet Take 1 tablet (10 mg total) by mouth daily.  . sertraline (ZOLOFT) 50 MG tablet Take 1 tablet (50 mg total) by mouth daily.  [name] spironolactone (ALDACTONE) 25 MG tablet Take 1 tablet (25 mg total) by mouth daily.  . busPIRone (BUSPAR) 5 MG tablet Take by mouth.  . [DISCONTINUED] albuterol (PROVENTIL) (2.5 MG/3ML) 0.083% nebulizer solution Take 3 mLs (2.5 mg total) by nebulization every 6 (six) hours asneeded for wheezing or shortness of breath. (Patient not taking: Reported on 04/07/2019)  . [DISCONTINUED] omeprazole  (PRILOSEC) 20 MG capsule    No facility-administered encounter medications on file as of 04/07/2019.     Activities of Daily Living In your present state of health, do you have any difficulty performing the following activities: 04/07/2019  Hearing? Y  Comment bilateral hearing aids  Vision? N  Comment glasses  Difficulty concentrating or making decisions? Y  Walking or climbing stairs? Y  Dressing or bathing? N  Doing errands, shopping? Y  Preparing Food and eating ? N  Using the Toilet? N  In the  past six months, have you accidently leaked urine? N  Comment wears depends  Do you have problems with loss of bowel control? Y  Comment 1-2 times a month, wears depends  Managing your Medications? Y  Comment family assists  Managing your Finances? Y  Comment familty assists  Housekeeping or managing your Housekeeping? Y  Comment someone assists  Some recent data might be hidden    Patient Care Team: Venita Lick, NP as PCP - General (Nurse Practitioner) Winfield Rast, DC as Referring Physician (Chiropractic Medicine) Deboraha Sprang, MD as Consulting Physician (Cardiology) Minna Merritts, MD as Consulting Physician (Cardiology) Marry Guan, Laurice Record, MD (Orthopedic Surgery) Minor, Dalbert Garnet, RN as Case Manager    Assessment:   This is a routine wellness examination for Dariann.  Exercise Activities and Dietary recommendations Current Exercise Habits: Structured exercise class(PT), Type of exercise: strength training/weights, Time (Minutes): 45, Frequency (Times/Week): 2, Weekly Exercise (Minutes/Week): 90, Exercise limited by: orthopedic condition(s);cardiac condition(s)  Goals    . DIET - INCREASE WATER INTAKE     Recommend drinking at least 6-8 glasses of water a day     . Increase water intake     Recommend drinking at least 3-4 glasses of water a day    . Physical therepy needs (pt-stated)     Current Barriers:  [name] Knowledge Deficits related to obtaining a [facility]  PT   Nurse Case Manager Clinical Goal(s):  [name] Over the next 30  days, patient will work with [facility]  to address needs related to PT referral   Interventions:  . Reviewed medications with patient and discussed Patient's recent trial of Namenda started by Neuro but daughter/Caregiver [name] reports patient's mood was angry/agitated on the medication and patient was unable to tolerate it. Also discussed Neuro started patient on Buspar prn for anxiety. Daughter stated patient took this and the whole dose was "too much" for her, and made her too "dulled out" Daughter stated she plans to try half the dose. Also talked with daughter about a pharmacy consult to brainstorm other medication ideas.  . Discussed plans with patient for ongoing care management follow up and provided patient with direct contact information for care management team . Provided patient and/or caregiver with resource information about care-giving and dementia/alzheimer's   (community resource). . Pharmacy referral for medications to assist patient with memory and agitation  . Patient with Memory issues, daughter the main caregiver. Daughter does state she is taking a small break this week end and recognizes the importance of caring for herself.  . Patient has started [facility] PT through [facility] and daughter reports this has been going very well. Daughter reports patient was able to obtain a steroid injection in her knee that was helpful for about a week but now they are using ice and salon pas patches related to ongoing pain.   Patient Self Care Activities:  . Currently UNABLE TO independently obtain PT therapy.   Please see past updates related to this goal by clicking on the "Past Updates" button in the selected goal         Fall Risk: Fall Risk  04/07/2019 03/20/2018 10/05/2017 09/24/2017 09/03/2017  Falls in the past year? 1 Yes Yes No No  Comment - - - - -  Number falls in past yr: 0 1 1 - -  Comment - - - - -  Injury with Fall? 1 Yes  Yes - -  Comment - - - - -  Risk Factor Category  - High Fall Risk High Fall Risk - High Fall Risk  Risk for fall due to : History of fall(s);Impaired balance/gait - History of fall(s);Impaired balance/gait;Impaired mobility - History of fall(s);Impaired balance/gait;Impaired mobility  Follow up - Falls evaluation completed;Falls prevention discussed Falls evaluation completed;Education provided;Falls prevention discussed - Falls evaluation completed;Education provided;Falls prevention discussed    FALL RISK PREVENTION PERTAINING TO THE HOME:  Any stairs in or around the home? Yes  If so, are there any without handrails? No   Home free of loose throw rugs in walkways, pet beds, electrical cords, etc? Yes  Adequate lighting in your home to reduce risk of falls? Yes   ASSISTIVE DEVICES UTILIZED TO PREVENT FALLS:  Life alert? Yes  Use of a cane, walker or w/c? Yes  cane, walker, wheelchair as needed Grab bars in the bathroom? Yes  Shower chair or bench in shower? Yes  Elevated toilet seat or a handicapped toilet? Yes   DME ORDERS:  DME order needed?  No   TIMED UP AND GO:  Unable to perform    Depression Screen PHQ 2/9 Scores 04/07/2019 03/20/2018 02/26/2018 10/05/2017  PHQ - 2 Score 1 2 4 2   PHQ- 9 Score - 7 7 5      Cognitive Function MMSE - Mini Mental State Exam 06/03/2018  Orientation to time 3  Orientation to Place 4  Registration 3  Attention/ Calculation 3  Recall 0  Language- name 2 objects 2  Language- repeat 1  Language- follow 3 step command 3  Language- read & follow direction 1  Write a sentence 1  Copy design 1  Total score 22     6CIT Screen 03/20/2018 03/14/2017  What Year? 4 points 0 points  What month? 0 points 0 points  What time? 0 points 0 points  Count back from 20 2 points 0 points  Months in reverse 0 points 0 points  Repeat phrase 4 points 4 points  Total Score 10 4    Immunization History  Administered Date(s) Administered  . Influenza,  High Dose Seasonal PF 05/31/2016, 06/22/2017  . Influenza,inj,Quad PF,6+ Mos 05/24/2015  . Influenza,inj,quad, With Preservative 06/04/2018  . Influenza-Unspecified 05/29/2018  . Pneumococcal Conjugate-13 04/08/2014  . Pneumococcal Polysaccharide-23 12/29/2003  . Td 09/12/2016  . Tdap 01/01/2018    Qualifies for Shingles Vaccine? Yes  Zostavax completed n/a. Due for Shingrix. Education has been provided regarding the importance of this vaccine. Pt has been advised to call insurance company to determine out of pocket expense. Advised may also receive vaccine at local pharmacy or Health Dept. Verbalized acceptance and understanding.  Tdap: up to date   Flu Vaccine: Due 04/2019  Pneumococcal Vaccine: completed series   Screening Tests Health Maintenance  Topic Date Due  . INFLUENZA VACCINE  03/29/2019  . TETANUS/TDAP  01/02/2028  . DEXA SCAN  Completed  . PNA vac Low Risk Adult  Completed    Cancer Screenings:  Colorectal Screening: no longer required   Mammogram: no longer required  Bone Density: Completed   Lung Cancer Screening: (Low Dose CT Chest recommended if Age 21-80 years, 30 pack-year currently smoking OR have quit w/in 15years.) does not qualify.    Additional Screening:  Hepatitis C Screening: does not qualify  Vision Screening: Recommended annual ophthalmology exams for early detection of glaucoma and other disorders of the eye. Is the patient up to date with their annual eye exam?  Yes  Who is the provider or what is  the name of the office in which the pt attends annual eye exams? Dr.Cheek   Dental Screening: Recommended annual dental exams for proper oral hygiene  Community Resource Referral:  CRR required this visit?  No       Plan:  I have personally reviewed and addressed the Medicare Annual Wellness questionnaire and have noted the following in the patient's chart:  A. Medical and social history B. Use of alcohol, tobacco or illicit drugs  C.  Current medications and supplements D. Functional ability and status E.  Nutritional status F.  Physical activity G. Advance directives H. List of other physicians I.  Hospitalizations, surgeries, and ER visits in previous 12 months J.  [place] such as hearing and vision if needed, cognitive and depression L. Referrals and appointments   In addition, I have reviewed and discussed with patient certain preventive protocols, quality metrics, and best practice recommendations. A written personalized care plan for preventive services as well as general preventive health recommendations were provided to patient.  Signed,    Bevelyn Ngo, LPN  [id number]/6720 Nurse Health Advisor   Nurse Notes: none

## 2019-04-07 NOTE — Patient Instructions (Signed)
Barbara Blackburn , Thank you for taking time to come for your Medicare Wellness Visit. I appreciate your ongoing commitment to your health goals. Please review the following plan we discussed and let me know if I can assist you in the future.   Screening recommendations/referrals: Colonoscopy: no longer required  Mammogram: no longer required Bone Density: no longer required  Recommended yearly ophthalmology/optometry visit for glaucoma screening and checkup Recommended yearly dental visit for hygiene and checkup  Vaccinations: Influenza vaccine: up to date  Pneumococcal vaccine: up to date Tdap vaccine: up to date Shingles vaccine: shingrix eligible    Advanced directives: copy on file   Conditions/risks identified: fall prevention discussed   Next appointment: follow up in one year for your annual wellness visit    Preventive Care 39 Years and Older, Female Preventive care refers to lifestyle choices and visits with your health care provider that can promote health and wellness. What does preventive care include?  A yearly physical exam. This is also called an annual well check.  Dental exams once or twice a year.  Routine eye exams. Ask your health care provider how often you should have your eyes checked.  Personal lifestyle choices, including:  Daily care of your teeth and gums.  Regular physical activity.  Eating a healthy diet.  Avoiding tobacco and drug use.  Limiting alcohol use.  Practicing safe sex.  Taking low-dose aspirin every day.  Taking vitamin and mineral supplements as recommended by your health care provider. What happens during an annual well check? The services and screenings done by your health care provider during your annual well check will depend on your age, overall health, lifestyle risk factors, and family history of disease. Counseling  Your health care provider may ask you questions about your:  Alcohol use.  Tobacco use.  Drug use.   Emotional well-being.  Home and relationship well-being.  Sexual activity.  Eating habits.  History of falls.  Memory and ability to understand (cognition).  Work and work Statistician.  Reproductive health. Screening  You may have the following tests or measurements:  Height, weight, and BMI.  Blood pressure.  Lipid and cholesterol levels. These may be checked every 5 years, or more frequently if you are over 69 years old.  Skin check.  Lung cancer screening. You may have this screening every year starting at age 67 if you have a 30-pack-year history of smoking and currently smoke or have quit within the past 15 years.  Fecal occult blood test (FOBT) of the stool. You may have this test every year starting at age 46.  Flexible sigmoidoscopy or colonoscopy. You may have a sigmoidoscopy every 5 years or a colonoscopy every 10 years starting at age 71.  Hepatitis C blood test.  Hepatitis B blood test.  Sexually transmitted disease (STD) testing.  Diabetes screening. This is done by checking your blood sugar (glucose) after you have not eaten for a while (fasting). You may have this done every 1-3 years.  Bone density scan. This is done to screen for osteoporosis. You may have this done starting at age 35.  Mammogram. This may be done every 1-2 years. Talk to your health care provider about how often you should have regular mammograms. Talk with your health care provider about your test results, treatment options, and if necessary, the need for more tests. Vaccines  Your health care provider may recommend certain vaccines, such as:  Influenza vaccine. This is recommended every year.  Tetanus, diphtheria,  and acellular pertussis (Tdap, Td) vaccine. You may need a Td booster every 10 years.  Zoster vaccine. You may need this after age 70.  Pneumococcal 13-valent conjugate (PCV13) vaccine. One dose is recommended after age 26.  Pneumococcal polysaccharide (PPSV23)  vaccine. One dose is recommended after age 47. Talk to your health care provider about which screenings and vaccines you need and how often you need them. This information is not intended to replace advice given to you by your health care provider. Make sure you discuss any questions you have with your health care provider. Document Released: 09/10/2015 Document Revised: 05/03/2016 Document Reviewed: 06/15/2015 Elsevier Interactive Patient Education  2017 [place] Prevention in the Home Falls can cause injuries. They can happen to people of all ages. There are many things you can do to make your home safe and to help prevent falls. What can I do on the outside of my home?  Regularly fix the edges of walkways and driveways and fix any cracks.  Remove anything that might make you trip as you walk through a door, such as a raised step or threshold.  Trim any bushes or trees on the path to your home.  Use bright outdoor lighting.  Clear any walking paths of anything that might make someone trip, such as rocks or tools.  Regularly check to see if handrails are loose or broken. Make sure that both sides of any steps have handrails.  Any raised decks and porches should have guardrails on the edges.  Have any leaves, snow, or ice cleared regularly.  Use sand or salt on walking paths during winter.  Clean up any spills in your garage right away. This includes oil or grease spills. What can I do in the bathroom?  Use night lights.  Install grab bars by the toilet and in the tub and shower. Do not use towel bars as grab bars.  Use non-skid mats or decals in the tub or shower.  If you need to sit down in the shower, use a plastic, non-slip stool.  Keep the floor dry. Clean up any water that spills on the floor as soon as it happens.  Remove soap buildup in the tub or shower regularly.  Attach bath mats securely with double-sided non-slip rug tape.  Do not have throw rugs  and other things on the floor that can make you trip. What can I do in the bedroom?  Use night lights.  Make sure that you have a light by your bed that is easy to reach.  Do not use any sheets or blankets that are too big for your bed. They should not hang down onto the floor.  Have a firm chair that has side arms. You can use this for support while you get dressed.  Do not have throw rugs and other things on the floor that can make you trip. What can I do in the kitchen?  Clean up any spills right away.  Avoid walking on wet floors.  Keep items that you use a lot in easy-to-reach places.  If you need to reach something above you, use a strong step stool that has a grab bar.  Keep electrical cords out of the way.  Do not use floor polish or wax that makes floors slippery. If you must use wax, use non-skid floor wax.  Do not have throw rugs and other things on the floor that can make you trip. What can I do with my  stairs?  Do not leave any items on the stairs.  Make sure that there are handrails on both sides of the stairs and use them. Fix handrails that are broken or loose. Make sure that handrails are as long as the stairways.  Check any carpeting to make sure that it is firmly attached to the stairs. Fix any carpet that is loose or worn.  Avoid having throw rugs at the top or bottom of the stairs. If you do have throw rugs, attach them to the floor with carpet tape.  Make sure that you have a light switch at the top of the stairs and the bottom of the stairs. If you do not have them, ask someone to add them for you. What else can I do to help prevent falls?  Wear shoes that:  Do not have high heels.  Have rubber bottoms.  Are comfortable and fit you well.  Are closed at the toe. Do not wear sandals.  If you use a stepladder:  Make sure that it is fully opened. Do not climb a closed stepladder.  Make sure that both sides of the stepladder are locked into  place.  Ask someone to hold it for you, if possible.  Clearly mark and make sure that you can see:  Any grab bars or handrails.  First and last steps.  Where the edge of each step is.  Use tools that help you move around (mobility aids) if they are needed. These include:  Canes.  Walkers.  Scooters.  Crutches.  Turn on the lights when you go into a dark area. Replace any light bulbs as soon as they burn out.  Set up your furniture so you have a clear path. Avoid moving your furniture around.  If any of your floors are uneven, fix them.  If there are any pets around you, be aware of where they are.  Review your medicines with your doctor. Some medicines can make you feel dizzy. This can increase your chance of falling. Ask your doctor what other things that you can do to help prevent falls. This information is not intended to replace advice given to you by your health care provider. Make sure you discuss any questions you have with your health care provider. Document Released: 06/10/2009 Document Revised: 01/20/2016 Document Reviewed: 09/18/2014 Elsevier Interactive Patient Education  2017 [employer].

## 2019-04-08 ENCOUNTER — Ambulatory Visit: Payer: Medicare Other | Admitting: Pharmacist

## 2019-04-08 DIAGNOSIS — G301 Alzheimer's disease with late onset: Secondary | ICD-10-CM | POA: Diagnosis not present

## 2019-04-08 DIAGNOSIS — Z7901 Long term (current) use of anticoagulants: Secondary | ICD-10-CM | POA: Diagnosis not present

## 2019-04-08 DIAGNOSIS — E785 Hyperlipidemia, unspecified: Secondary | ICD-10-CM | POA: Diagnosis not present

## 2019-04-08 DIAGNOSIS — I4821 Permanent atrial fibrillation: Secondary | ICD-10-CM

## 2019-04-08 DIAGNOSIS — D649 Anemia, unspecified: Secondary | ICD-10-CM | POA: Diagnosis not present

## 2019-04-08 DIAGNOSIS — I4891 Unspecified atrial fibrillation: Secondary | ICD-10-CM | POA: Diagnosis not present

## 2019-04-08 DIAGNOSIS — M150 Primary generalized (osteo)arthritis: Secondary | ICD-10-CM | POA: Diagnosis not present

## 2019-04-08 DIAGNOSIS — Z9181 History of falling: Secondary | ICD-10-CM | POA: Diagnosis not present

## 2019-04-08 DIAGNOSIS — I509 Heart failure, unspecified: Secondary | ICD-10-CM | POA: Diagnosis not present

## 2019-04-08 DIAGNOSIS — I110 Hypertensive heart disease with heart failure: Secondary | ICD-10-CM | POA: Diagnosis not present

## 2019-04-08 NOTE — Patient Instructions (Signed)
Visit Information  Goals Addressed            This Visit's Progress     Patient Stated   . "She's in the donut hole" (pt-stated)       Current Barriers:  . Financial Barriers: patient has [employer] and is in [employer]; Reports copay for Xarelto is ~$144/month. Daughter reports that she is unsure how the Coverage Gap Works.  . Discussed that she does not want her mother switching back to warfarin for stroke prevention in Afib  Pharmacist Clinical Goal(s):  [name] Over the next 30 days, patient will work with PharmD and providers to relieve medication access concerns  Interventions: . Comprehensive medication review completed; medication list updated in electronic medical record.  [place] Reasons by Toma Aran: Patient meets income requirement, unsure if she has met out of pocket spend criteria yet. or this medication's patient assistance program. Reviewed application process. Patient/her daughter will contact [place] to get printout of current out of pocket spend, and will bring this plus proof of income by clinic; will sign patient portion of application that I will leave at front desk.  . She has not seen general cardiologist Dr. Rockey Situ since 05/2017; will collaborate with [name], NP or EP cardiologist Dr. Caryl Comes on provider portion of application.   Patient Self Care Activities:  . Patient will provide necessary portions of application   Initial goal documentation        The patient verbalized understanding of instructions provided today and declined a print copy of patient instruction materials.   Plan: - Once all parts of application are received, will submit and pass along to [name], [name] for follow up  - Will continue to support patient/her daughter with chronic medication management concerns in the next 4-5 weeks   Catie Darnelle Maffucci, PharmD Clinical Pharmacist [place] [phone]

## 2019-04-08 NOTE — Chronic Care Management (AMB) (Signed)
Chronic Care Management   Note  04/08/2019 Name: Barbara Blackburn MRN: [id number] DOB: 02-09-1929   Subjective:  Barbara Blackburn is a 85 y.o. year old female who is a primary care patient of Cannady, Barbara Faster, NP. The CCM team was consulted for assistance with chronic disease management and care coordination needs.    Spoke with patient's daughter, Barbara Blackburn (per [facility]) for medication management review. She is concerned about the cost of Xarelto.  Review of patient status, including review of consultants reports, laboratory and other test data, was performed as part of comprehensive evaluation and provision of chronic care management services.   Objective:  Lab Results  Component Value Date   CREATININE 0.89 01/16/2019   CREATININE 0.78 11/07/2018   CREATININE 0.81 09/05/2018    No results found for: HGBA1C     Component Value Date/Time   CHOL 149 09/12/2016 1119   CHOL 145 09/01/2015 1101   TRIG 276 (H) 09/12/2016 1119   TRIG 182 (H) 09/01/2015 1101   HDL 57 09/12/2016 1119   VLDL 36 (H) 09/01/2015 1101   LDLCALC 37 09/12/2016 1119    Clinical ASCVD: Yes   BP Readings from Last 3 Encounters:  04/07/19 126/65  02/12/19 130/87  01/16/19 128/64    Allergies  Allergen Reactions  . Clonazepam     Nightmare   . Fluoxetine Hcl Other (See Comments)    nightmares  . Lisinopril     Cough   . Zolpidem Tartrate Other (See Comments)    Nightmares     Medications Reviewed Today    Reviewed by De Hollingshead, [facility] (Pharmacist) on 04/08/19 at 1255  Med List Status: <None>  Medication Order Taking? Sig Documenting Provider Last Dose Status Informant  busPIRone (BUSPAR) 5 MG tablet [id number] Yes Take 5 mg by mouth daily as needed.  [provider] Taking Active            Med Note Darnelle Maffucci, Arville Lime   Tue Apr 08, 2019 12:55 PM) Takes 2.5 mg if during day, 5 mg at night   Cholecalciferol (VITAMIN D) 2000 units tablet [id number] Yes Take 2,000 Units by mouth  daily. [provider] Taking Active         Discontinued 04/08/19 1254 (Completed Course)   furosemide (LASIX) 40 MG tablet [id number] Yes Take 3 tablets (120 mg) by mouth once every other day in the morning Deboraha Sprang, MD Taking Active   galantamine (RAZADYNE) 4 MG tablet [id number] Yes Take 1 tablet (4 mg total) by mouth 2 (two) times daily with a meal. Cannady, Jolene T, NP Taking Active   isosorbide mononitrate (IMDUR) 30 MG 24 hr tablet [id number] Yes Take 1 tablet (30 mg total) by mouth daily. Deboraha Sprang, MD Taking Active   losartan (COZAAR) 50 MG tablet [id number] Yes Take 1 tablet (50 mg total) by mouth daily. [name] T, NP Taking Active   Multiple Vitamin (MULTIVITAMIN) capsule [id number] Yes Take 1 capsule by mouth daily. [provider] Taking Active   rivaroxaban (XARELTO) 20 MG TABS tablet [id number] Yes TAKE 1 TABLET BY MOUTH  DAILY WITH SUPPER Cannady, Jolene T, NP Taking Active   rosuvastatin (CRESTOR) 10 MG tablet [id number] Yes Take 1 tablet (10 mg total) by mouth daily. [name] T, NP Taking Active   sertraline (ZOLOFT) 50 MG tablet [id number] Yes Take 1 tablet (50 mg total) by mouth daily. [name] T, NP Taking Active   spironolactone (ALDACTONE) 25 MG tablet [id number]  Yes Take 1 tablet (25 mg total) by mouth daily. Deboraha Sprang, MD Taking Active            Assessment:   Goals Addressed            This Visit's Progress     Patient Stated   . "She's in the donut hole" (pt-stated)       Current Barriers:  . Financial Barriers: patient has [employer] and is in [employer]; Reports copay for Xarelto is ~$144/month. Daughter reports that she is unsure how the Coverage Gap Works.  . Discussed that she does not want her mother switching back to warfarin for stroke prevention in Afib  Pharmacist Clinical Goal(s):  [name] Over the next 30 days, patient will work with PharmD and providers to  relieve medication access concerns  Interventions: . Comprehensive medication review completed; medication list updated in electronic medical record.  [employer] Reasons by Toma Aran: Patient meets income requirement, unsure if she has met out of pocket spend criteria yet. or this medication's patient assistance program. Reviewed application process. Patient/her daughter will contact [place] to get printout of current out of pocket spend, and will bring this plus proof of income by clinic; will sign patient portion of application that I will leave at front desk.  . She has not seen general cardiologist Dr. Rockey Situ since 05/2017; will collaborate with [name], NP or EP cardiologist Dr. Caryl Comes on provider portion of application.   Patient Self Care Activities:  . Patient will provide necessary portions of application   Initial goal documentation        Plan: - Once all parts of application are received, will submit and pass along to [facility], [name] for follow up  - Will continue to support patient/her daughter with chronic medication management concerns in the next 4-5 weeks   Catie Darnelle Maffucci, PharmD Clinical Pharmacist [place] [phone]

## 2019-04-09 ENCOUNTER — Telehealth: Payer: Self-pay

## 2019-04-14 DIAGNOSIS — G301 Alzheimer's disease with late onset: Secondary | ICD-10-CM | POA: Diagnosis not present

## 2019-04-14 DIAGNOSIS — Z7901 Long term (current) use of anticoagulants: Secondary | ICD-10-CM | POA: Diagnosis not present

## 2019-04-14 DIAGNOSIS — Z9181 History of falling: Secondary | ICD-10-CM | POA: Diagnosis not present

## 2019-04-14 DIAGNOSIS — D649 Anemia, unspecified: Secondary | ICD-10-CM | POA: Diagnosis not present

## 2019-04-14 DIAGNOSIS — I509 Heart failure, unspecified: Secondary | ICD-10-CM | POA: Diagnosis not present

## 2019-04-14 DIAGNOSIS — E785 Hyperlipidemia, unspecified: Secondary | ICD-10-CM | POA: Diagnosis not present

## 2019-04-14 DIAGNOSIS — M150 Primary generalized (osteo)arthritis: Secondary | ICD-10-CM | POA: Diagnosis not present

## 2019-04-14 DIAGNOSIS — I110 Hypertensive heart disease with heart failure: Secondary | ICD-10-CM | POA: Diagnosis not present

## 2019-04-14 DIAGNOSIS — I4891 Unspecified atrial fibrillation: Secondary | ICD-10-CM | POA: Diagnosis not present

## 2019-04-15 ENCOUNTER — Ambulatory Visit: Payer: Self-pay | Admitting: Pharmacist

## 2019-04-15 ENCOUNTER — Other Ambulatory Visit: Payer: Self-pay

## 2019-04-15 ENCOUNTER — Encounter: Payer: Self-pay | Admitting: Internal Medicine

## 2019-04-15 ENCOUNTER — Ambulatory Visit (INDEPENDENT_AMBULATORY_CARE_PROVIDER_SITE_OTHER): Payer: Medicare Other | Admitting: Internal Medicine

## 2019-04-15 VITALS — BP 138/66 | HR 76 | Ht 61.0 in | Wt 163.0 lb

## 2019-04-15 DIAGNOSIS — I4821 Permanent atrial fibrillation: Secondary | ICD-10-CM

## 2019-04-15 DIAGNOSIS — Z95810 Presence of automatic (implantable) cardiac defibrillator: Secondary | ICD-10-CM | POA: Diagnosis not present

## 2019-04-15 DIAGNOSIS — I5022 Chronic systolic (congestive) heart failure: Secondary | ICD-10-CM | POA: Diagnosis not present

## 2019-04-15 DIAGNOSIS — I428 Other cardiomyopathies: Secondary | ICD-10-CM

## 2019-04-15 DIAGNOSIS — I4819 Other persistent atrial fibrillation: Secondary | ICD-10-CM

## 2019-04-15 MED ORDER — LOSARTAN POTASSIUM 25 MG PO TABS: 25.0000 mg | ORAL_TABLET | Freq: Every day | ORAL | 3 refills | Status: AC

## 2019-04-15 MED ORDER — METOPROLOL SUCCINATE ER 25 MG PO TB24: 25.0000 mg | ORAL_TABLET | Freq: Every day | ORAL | 3 refills | Status: AC

## 2019-04-15 MED ORDER — SPIRONOLACTONE 25 MG PO TABS: 12.5000 mg | ORAL_TABLET | Freq: Every day | ORAL | Status: AC

## 2019-04-15 NOTE — Patient Instructions (Signed)
Visit Information  Goals Addressed            This Visit's Progress     Patient Stated   . "She's in the donut hole" (pt-stated)       Current Barriers:  . Financial Barriers: patient has [employer] and is in [employer]; Reports copay for Xarelto is ~$144/month. Daughter reports that she is unsure how the Coverage Gap Works.  . Collaboratively decided to apply for patient assistance for Xarelto through [name] . Discussed that she does not want her mother switching back to warfarin for stroke prevention in Afib  Pharmacist Clinical Goal(s):  [name] Over the next 30 days, patient will work with PharmD and providers to relieve medication access concerns  Interventions: . Received patient and provider portions of application, as well as necessary income information. Submitted application to [name]. Will pass materials along to [name], [name] for follow up.  Patient Self Care Activities:  . Patient will provide necessary portions of application   Please see past updates related to this goal by clicking on the "Past Updates" button in the selected goal         The patient verbalized understanding of instructions provided today and declined a print copy of patient instruction materials.   Plan:  - Will pass application materials along to [name], [name] for follow up.  - Will outreach patient in 4-6 weeks for continued medication management support  Catie Darnelle Maffucci, PharmD Clinical Pharmacist [place] [phone]

## 2019-04-15 NOTE — Patient Instructions (Signed)
Medication Instructions:  - Your physician has recommended you make the following change in your medication:   1) STOP imdur (isosorbide)  2) Decrease cozaar (losartan) to 25 mg- take 1 tablet (25 mg) by mouth once daily  3) Decrease aldactone (spironolactone) 25 mg- take 1/2 tablet (12.5 mg) once daily  4) Start toprol (metoprolol succinate) 25 mg- take 1 tablet (25 mg) by mouth once daily  If you need a refill on your cardiac medications before your next appointment, please call your pharmacy.   Lab work: - none ordered  If you have labs (blood work) drawn today and your tests are completely normal, you will receive your results only by: [name] MyChart Message (if you have MyChart) OR . A paper copy in the mail If you have any lab test that is abnormal or we need to change your treatment, we will call you to review the results.  Testing/Procedures: - none ordered  Follow-Up: At [facility], you and your health needs are our priority.  As part of our continuing mission to provide you with exceptional heart care, we have created designated Provider Care Teams.  These Care Teams include your primary Cardiologist (physician) and Advanced Practice Providers (APPs -  Physician Assistants and Nurse Practitioners) who all work together to provide you with the care you need, when you need it.  You will need a follow up appointment in 6 months (February 2021).  . Please call our office 2 months in advance to schedule this appointment.  (Call in early December to schedule).   Any Other Special Instructions Will Be Listed Below (If Applicable). - N/A

## 2019-04-15 NOTE — Progress Notes (Signed)
.      Patient Care Team: Venita Lick, NP as PCP - General (Nurse Practitioner) Winfield Rast, DC as Referring Physician (Chiropractic Medicine) Deboraha Sprang, MD as Consulting Physician (Cardiology) Minna Merritts, MD as Consulting Physician (Cardiology) Marry Guan Laurice Record, MD (Orthopedic Surgery) Minor, Dalbert Garnet, RN as Case Manager   HPI  Barbara Blackburn is a 85 y.o. female Seen in followup for a dual chamber ICD implanted for syncope in the setting of  Nonischemic and  ischemic cardiomyopathy with prior STENTING  and congestive heart failure. She had  received a defibrillator with a 6949-lead.  At  device generator replacement in 2012; she underwent "pirating" of her rate sense pacing lead and capping of the rate sense portion of her ICD lead  She reached ERI and underwent generator replacement 4/18.  Atrial fibrillation.  Permanent.  Prior stroke.  Anticoagulation with rivaroxaban.   Seen in 2/20  following an ICD discharge  ATP failed ICD discharge terminated V T/VF as well as transiently atrial fibrillation in setting of his congestive heart failure     DATE TEST    2012        EF 20.25 %   2/14    Myoview   EF 50 %   4/17 Echo EF 60-65%   5/20 Echo  20-25% LAE (4.9/-/63)       Date Cr K GFR Hgb  7/18 0.81  60s 15.2  5/19  0.72 3.6  15.1  1/20 0.81 4.0  14.1  5/20 0.89 4.2  14.4    She has been followed closely in the [facility] clinic.  OptiVol's are now approaching baseline.  No edema.  Reviewed fluid intake.  Well more than 2 L.  Weak in the mornings.  Blood pressures in the morning typically are in the 90--110 range.  Thromboembolic risk factors ( age  -2, HTN-1, TIA/CVA-2, Vasc disease -1, Gender-1) for a CHADSVASc Score of 7    Past Medical History:  Diagnosis Date  . 6949-lead 11/11/2013  . Allergy   . CHF (congestive heart failure) (HCC)    class 2  . Chronic atrial fibrillation   . Coronary artery disease   . Degenerative joint disease    severe   . Dyslipidemia   . Hyperlipidemia   . ICD-Medtronic 05/10/2009   Qualifier: Diagnosis of  By: Lovena Le, MD, [facility], Binnie Kand   . Nonischemic cardiomyopathy [facility])     Past Surgical History:  Procedure Laterality Date  . ICD GENERATOR CHANGEOUT N/A 12/06/2016   Procedure: ICD Generator Changeout;  Surgeon: Deboraha Sprang, MD;  Location: [place] CV LAB;  Service: Cardiovascular;  Laterality: N/A;  . Medtronic dual-chamber ICD  01/05/2006   generator change April 2012  . PCI stent    . REPLACEMENT TOTAL KNEE      Current Outpatient Medications  Medication Sig Dispense Refill  . busPIRone (BUSPAR) 5 MG tablet Take 5 mg by mouth daily as needed.     . Cholecalciferol (VITAMIN D) 2000 units tablet Take 2,000 Units by mouth daily.    . furosemide (LASIX) 40 MG tablet Take 3 tablets (120 mg) by mouth once every other day in the morning    . galantamine (RAZADYNE) 4 MG tablet Take 1 tablet (4 mg total) by mouth 2 (two) times daily with a meal. 180 tablet 2  . losartan (COZAAR) 50 MG tablet Take 1 tablet (50 mg total) by mouth daily. 90 tablet 3  .  Multiple Vitamin (MULTIVITAMIN) capsule Take 1 capsule by mouth daily.    . rivaroxaban (XARELTO) 20 MG TABS tablet TAKE 1 TABLET BY MOUTH  DAILY WITH SUPPER 90 tablet 3  . rosuvastatin (CRESTOR) 10 MG tablet Take 1 tablet (10 mg total) by mouth daily. 30 tablet 1  . sertraline (ZOLOFT) 50 MG tablet Take 1 tablet (50 mg total) by mouth daily. 90 tablet 3  . isosorbide mononitrate (IMDUR) 30 MG 24 hr tablet Take 1 tablet (30 mg total) by mouth daily. 30 tablet 6  . spironolactone (ALDACTONE) 25 MG tablet Take 1 tablet (25 mg total) by mouth daily. 90 tablet 3   No current facility-administered medications for this visit.     Allergies  Allergen Reactions  . Clonazepam     Nightmare   . Fluoxetine Hcl Other (See Comments)    nightmares  . Lisinopril     Cough   . Zolpidem Tartrate Other (See Comments)    Nightmares     Review of  Systems negative except from HPI and PMH BP 138/66 (BP Location: Left Arm, Patient Position: Sitting, Cuff Size: Normal)   Pulse 76   Ht 5\' 1"  (1.549 m)   Wt 163 lb (73.9 kg)   LMP  (LMP Unknown)   SpO2 98%   BMI 30.80 kg/m   Well developed and nourished in no acute distress HENT normal Neck supple with JVP-flat Clear Device pocket well healed; without hematoma or erythema.  There is no tethering subcutaneous atrophy Irregularly irregular rate and rhythm with controlled ventricular response, no murmurs or gallops Abd-soft with active BS  No Clubbing cyanosis edema Skin-warm and dry A & Oriented  Grossly normal sensory and motor function  ECG afib @ 93 -/13/43 IVCD Freq ectopy      Assessment and  Plan  Nonischemic cardiomyopathy  Atrial fibrillation permanent  Congestive heart failure-chronic  systolic  Syncope  VT/VF with appropriate interval therapy  Hypertension   Falls  ICD for secondary prevention  The patient's device was interrogated.  The information was reviewed. No changes were made in the programming.     6949-lead with capping of the rate sense portion     No intercurrent Ventricular tachycardia  AFib with RVR  Will resume BB stopped years ago 2/2 labile BP--metoprolol succinate 25 mg  Given AM low BP, will decrease losartan 50>>25 , and aldactone 25 >> 12.5 , choosing these so as to maintain K support  We have discussed the physiology of heart failure including the importance of salt restriction and fluid restriction and have reviewed sources of dietary salt and water.

## 2019-04-15 NOTE — Chronic Care Management (AMB) (Signed)
Chronic Care Management   Follow Up Note   04/15/2019 Name: Barbara Blackburn MRN: [id number] DOB: 1929/05/08  Referred by: Venita Lick, NP Reason for referral : Chronic Care Management (Medication Management)   Barbara Blackburn is a 85 y.o. year old female who is a primary care patient of Cannady, [name], NP. The CCM team was consulted for assistance with chronic disease management and care coordination needs.    Care coordination completed today.  Review of patient status, including review of consultants reports, relevant laboratory and other test results, and collaboration with appropriate care team members and the patient's provider was performed as part of comprehensive patient evaluation and provision of chronic care management services.    Outpatient Encounter Medications as of 04/15/2019  Medication Sig Note  . busPIRone (BUSPAR) 5 MG tablet Take 5 mg by mouth daily as needed.  04/08/2019: Takes 2.5 mg if during day, 5 mg at night   . Cholecalciferol (VITAMIN D) 2000 units tablet Take 2,000 Units by mouth daily.   . furosemide (LASIX) 40 MG tablet Take 3 tablets (120 mg) by mouth once every other day in the morning   . galantamine (RAZADYNE) 4 MG tablet Take 1 tablet (4 mg total) by mouth 2 (two) times daily with a meal.   . losartan (COZAAR) 25 MG tablet Take 1 tablet (25 mg total) by mouth daily.   . metoprolol succinate (TOPROL-XL) 25 MG 24 hr tablet Take 1 tablet (25 mg total) by mouth daily.   . Multiple Vitamin (MULTIVITAMIN) capsule Take 1 capsule by mouth daily.   . rivaroxaban (XARELTO) 20 MG TABS tablet TAKE 1 TABLET BY MOUTH  DAILY WITH SUPPER   . rosuvastatin (CRESTOR) 10 MG tablet Take 1 tablet (10 mg total) by mouth daily.   . sertraline (ZOLOFT) 50 MG tablet Take 1 tablet (50 mg total) by mouth daily.   [name] spironolactone (ALDACTONE) 25 MG tablet Take 0.5 tablets (12.5 mg total) by mouth daily.    No facility-administered encounter medications on file as of  04/15/2019.      Goals Addressed            This Visit's Progress     Patient Stated   . "She's in the donut hole" (pt-stated)       Current Barriers:  . Financial Barriers: patient has [employer] and is in [employer]; Reports copay for Xarelto is ~$144/month. Daughter reports that she is unsure how the Coverage Gap Works.  . Collaboratively decided to apply for patient assistance for Xarelto through [name] . Discussed that she does not want her mother switching back to warfarin for stroke prevention in Afib  Pharmacist Clinical Goal(s):  [name] Over the next 30 days, patient will work with PharmD and providers to relieve medication access concerns  Interventions: . Received patient and provider portions of application, as well as necessary income information. Submitted application to [name]. Will pass materials along to [name], [name] for follow up.  Patient Self Care Activities:  . Patient will provide necessary portions of application   Please see past updates related to this goal by clicking on the "Past Updates" button in the selected goal         Plan:  - Will pass application materials along to [name], [name] for follow up.  - Will outreach patient in 4-6 weeks for continued medication management support  Catie Darnelle Maffucci, PharmD Clinical Pharmacist [facility] [phone]

## 2019-04-17 DIAGNOSIS — I509 Heart failure, unspecified: Secondary | ICD-10-CM | POA: Diagnosis not present

## 2019-04-17 DIAGNOSIS — I4891 Unspecified atrial fibrillation: Secondary | ICD-10-CM | POA: Diagnosis not present

## 2019-04-17 DIAGNOSIS — Z9181 History of falling: Secondary | ICD-10-CM | POA: Diagnosis not present

## 2019-04-17 DIAGNOSIS — E785 Hyperlipidemia, unspecified: Secondary | ICD-10-CM | POA: Diagnosis not present

## 2019-04-17 DIAGNOSIS — Z7901 Long term (current) use of anticoagulants: Secondary | ICD-10-CM | POA: Diagnosis not present

## 2019-04-17 DIAGNOSIS — G301 Alzheimer's disease with late onset: Secondary | ICD-10-CM | POA: Diagnosis not present

## 2019-04-17 DIAGNOSIS — I110 Hypertensive heart disease with heart failure: Secondary | ICD-10-CM | POA: Diagnosis not present

## 2019-04-17 DIAGNOSIS — M150 Primary generalized (osteo)arthritis: Secondary | ICD-10-CM | POA: Diagnosis not present

## 2019-04-17 DIAGNOSIS — D649 Anemia, unspecified: Secondary | ICD-10-CM | POA: Diagnosis not present

## 2019-04-21 ENCOUNTER — Ambulatory Visit (INDEPENDENT_AMBULATORY_CARE_PROVIDER_SITE_OTHER): Payer: Medicare Other | Admitting: Pharmacist

## 2019-04-21 DIAGNOSIS — I4821 Permanent atrial fibrillation: Secondary | ICD-10-CM

## 2019-04-21 NOTE — Chronic Care Management (AMB) (Signed)
Chronic Care Management   Follow Up Note   04/21/2019 Name: Barbara Blackburn MRN: [id number] DOB: June 05, 1929  Referred by: Venita Lick, NP Reason for referral : Chronic Care Management (Medication Managment )   Barbara Blackburn is a 85 y.o. year old female who is a primary care patient of Cannady, [name], NP. The CCM team was consulted for assistance with chronic disease management and care coordination needs.    Received call from patient's daughter, [name], inquiring about medication access concerns.  Review of patient status, including review of consultants reports, relevant laboratory and other test results, and collaboration with appropriate care team members and the patient's provider was performed as part of comprehensive patient evaluation and provision of chronic care management services.    SDOH (Social Determinants of Health) screening performed today: Financial Strain . See Care Plan for related entries.   Outpatient Encounter Medications as of 04/21/2019  Medication Sig Note  . busPIRone (BUSPAR) 5 MG tablet Take 5 mg by mouth daily as needed.  04/08/2019: Takes 2.5 mg if during day, 5 mg at night   . Cholecalciferol (VITAMIN D) 2000 units tablet Take 2,000 Units by mouth daily.   . furosemide (LASIX) 40 MG tablet Take 3 tablets (120 mg) by mouth once every other day in the morning   . galantamine (RAZADYNE) 4 MG tablet Take 1 tablet (4 mg total) by mouth 2 (two) times daily with a meal.   . losartan (COZAAR) 25 MG tablet Take 1 tablet (25 mg total) by mouth daily.   . metoprolol succinate (TOPROL-XL) 25 MG 24 hr tablet Take 1 tablet (25 mg total) by mouth daily.   . Multiple Vitamin (MULTIVITAMIN) capsule Take 1 capsule by mouth daily.   . rivaroxaban (XARELTO) 20 MG TABS tablet TAKE 1 TABLET BY MOUTH  DAILY WITH SUPPER   . rosuvastatin (CRESTOR) 10 MG tablet Take 1 tablet (10 mg total) by mouth daily.   . sertraline (ZOLOFT) 50 MG tablet Take 1 tablet (50  mg total) by mouth daily.   [name] spironolactone (ALDACTONE) 25 MG tablet Take 0.5 tablets (12.5 mg total) by mouth daily.    No facility-administered encounter medications on file as of 04/21/2019.      Goals Addressed            This Visit's Progress     Patient Stated   . "She's in the donut hole" (pt-stated)       Current Barriers:  . Financial Barriers: patient has [employer] and is in [employer]; Reports copay for Xarelto is ~$144/month. Daughter reports that she is unsure how the Coverage Gap Works.  [name] Applied for [employer] assistance. Faxed to company on 04/15/2019.  [name] Received call today from patient's daughter inquiring if I had received financial information  Pharmacist Clinical Goal(s):  [name] Over the next 30 days, patient will work with PharmD and providers to relieve medication access concerns  Interventions: . Informed [name] that I had received and submitted application, and that [name], [name] with the drug company. Explained that [name] would provide a coupon card rather than mail the medication itself. She verbalized understanding.   Patient Self Care Activities:  . Patient will provide necessary portions of application   Please see past updates related to this goal by clicking on the "Past Updates" button in the selected goal          Plan:  -  Will continue to collaborate with [name], [name] regarding follow up with the patient assistance program.   [name], PharmD Clinical Pharmacist [place] [phone]

## 2019-04-21 NOTE — Patient Instructions (Signed)
Visit Information  Goals Addressed            This Visit's Progress     Patient Stated   . "She's in the donut hole" (pt-stated)       Current Barriers:  . Financial Barriers: patient has [employer] and is in [employer]; Reports copay for Xarelto is ~$144/month. Daughter reports that she is unsure how the Coverage Gap Works.  [name] Applied for [employer] assistance. Faxed to company on 04/15/2019.  [name] Received call today from patient's daughter inquiring if I had received financial information  Pharmacist Clinical Goal(s):  [name] Over the next 30 days, patient will work with PharmD and providers to relieve medication access concerns  Interventions: . Informed [name] that I had received and submitted application, and that [name], [name] with the drug company. Explained that [name] would provide a coupon card rather than mail the medication itself. She verbalized understanding.   Patient Self Care Activities:  . Patient will provide necessary portions of application   Please see past updates related to this goal by clicking on the "Past Updates" button in the selected goal         The patient verbalized understanding of instructions provided today and declined a print copy of patient instruction materials.  Plan:  - Will continue to collaborate with [name], [name] regarding follow up with the patient assistance program.   [name], PharmD Clinical Pharmacist [place] [phone]

## 2019-04-22 ENCOUNTER — Ambulatory Visit (INDEPENDENT_AMBULATORY_CARE_PROVIDER_SITE_OTHER): Payer: Medicare Other

## 2019-04-22 DIAGNOSIS — E785 Hyperlipidemia, unspecified: Secondary | ICD-10-CM | POA: Diagnosis not present

## 2019-04-22 DIAGNOSIS — M150 Primary generalized (osteo)arthritis: Secondary | ICD-10-CM | POA: Diagnosis not present

## 2019-04-22 DIAGNOSIS — Z95810 Presence of automatic (implantable) cardiac defibrillator: Secondary | ICD-10-CM

## 2019-04-22 DIAGNOSIS — G301 Alzheimer's disease with late onset: Secondary | ICD-10-CM | POA: Diagnosis not present

## 2019-04-22 DIAGNOSIS — I5022 Chronic systolic (congestive) heart failure: Secondary | ICD-10-CM

## 2019-04-22 DIAGNOSIS — I4891 Unspecified atrial fibrillation: Secondary | ICD-10-CM | POA: Diagnosis not present

## 2019-04-22 DIAGNOSIS — I110 Hypertensive heart disease with heart failure: Secondary | ICD-10-CM | POA: Diagnosis not present

## 2019-04-22 DIAGNOSIS — Z7901 Long term (current) use of anticoagulants: Secondary | ICD-10-CM | POA: Diagnosis not present

## 2019-04-22 DIAGNOSIS — Z9181 History of falling: Secondary | ICD-10-CM | POA: Diagnosis not present

## 2019-04-22 DIAGNOSIS — D649 Anemia, unspecified: Secondary | ICD-10-CM | POA: Diagnosis not present

## 2019-04-22 DIAGNOSIS — I509 Heart failure, unspecified: Secondary | ICD-10-CM | POA: Diagnosis not present

## 2019-04-22 LAB — CUP PACEART INCLINIC DEVICE CHECK
Battery Remaining Longevity: 101 mo
Battery Voltage: 3 V
Brady Statistic AP VP Percent: 0 %
Brady Statistic AP VS Percent: 0 %
Brady Statistic AS VP Percent: 0 %
Brady Statistic AS VS Percent: 0 %
Brady Statistic RA Percent Paced: 0 %
Brady Statistic RV Percent Paced: 6.36 %
Date Time Interrogation Session: 20200818205534
HighPow Impedance: 44 Ohm
HighPow Impedance: 59 Ohm
Implantable Lead Implant Date: 20040220
Implantable Lead Implant Date: 20070426
Implantable Lead Implant Date: 20070511
Implantable Lead Location: 753859
Implantable Lead Location: 753860
Implantable Lead Location: 753860
Implantable Lead Model: 5076
Implantable Lead Model: 5092
Implantable Lead Model: 6949
Implantable Pulse Generator Implant Date: 20180411
Lead Channel Impedance Value: 380 Ohm
Lead Channel Impedance Value: 399 Ohm
Lead Channel Impedance Value: 456 Ohm
Lead Channel Pacing Threshold Amplitude: 0.625 V
Lead Channel Pacing Threshold Pulse Width: 0.4 ms
Lead Channel Sensing Intrinsic Amplitude: 0.375 mV
Lead Channel Sensing Intrinsic Amplitude: 0.75 mV
Lead Channel Sensing Intrinsic Amplitude: 0.75 mV
Lead Channel Sensing Intrinsic Amplitude: 1.75 mV
Lead Channel Setting Pacing Amplitude: 2.5 V
Lead Channel Setting Pacing Pulse Width: 0.4 ms
Lead Channel Setting Sensing Sensitivity: 0.3 mV

## 2019-04-23 NOTE — Progress Notes (Signed)
EPIC Encounter for ICM Monitoring  Patient Name: TAURA WEHRLE is a 85 y.o. female Date: 04/23/2019 Primary Care Physican: Venita Lick, NP Primary Cardiologist:Gollan Electrophysiologist:Klein 02/19/2019 Weight: 160-162 lbs (baseline) 03/26/2019 Weight: 162.2 lbs 04/01/2019 Weight: 166 lbs 04/04/2019 Weight: 164.8 lbs  Attempted call to [name], [place] and unable to reach.  Left detailed message per DPR regarding transmission. Transmission reviewed.   OptivolThoracic impedancereturned to normal.  Prescribed: Furosemide40 mgTake 3 tablets (120 mg) by mouth once every other day in the morning.  Labs: 01/16/2019 Creatinine 0.89, BUN 20, Potassium 4.2, Sodium 141, GFR 58-67 11/07/2018 Creatinine0.78, BUN23, Potassium3.9, [id number], [id number] A complete set of results can be found in Results Review.  Recommendations:Left voice mail with ICM number and encouraged to call if experiencing any fluid symptoms.  Follow-up plan: ICM clinic phone appointment9/9/2020to recheck fluid levels.  Copy of ICM check sent to [place].   3 month ICM trend: 04/22/2019    1 Year ICM trend:       [name], RN 04/23/2019 4:38 PM

## 2019-04-24 DIAGNOSIS — D649 Anemia, unspecified: Secondary | ICD-10-CM | POA: Diagnosis not present

## 2019-04-24 DIAGNOSIS — I4891 Unspecified atrial fibrillation: Secondary | ICD-10-CM | POA: Diagnosis not present

## 2019-04-24 DIAGNOSIS — M150 Primary generalized (osteo)arthritis: Secondary | ICD-10-CM | POA: Diagnosis not present

## 2019-04-24 DIAGNOSIS — Z7901 Long term (current) use of anticoagulants: Secondary | ICD-10-CM | POA: Diagnosis not present

## 2019-04-24 DIAGNOSIS — Z9181 History of falling: Secondary | ICD-10-CM | POA: Diagnosis not present

## 2019-04-24 DIAGNOSIS — G301 Alzheimer's disease with late onset: Secondary | ICD-10-CM | POA: Diagnosis not present

## 2019-04-24 DIAGNOSIS — E785 Hyperlipidemia, unspecified: Secondary | ICD-10-CM | POA: Diagnosis not present

## 2019-04-24 DIAGNOSIS — I509 Heart failure, unspecified: Secondary | ICD-10-CM | POA: Diagnosis not present

## 2019-04-24 DIAGNOSIS — I110 Hypertensive heart disease with heart failure: Secondary | ICD-10-CM | POA: Diagnosis not present

## 2019-04-25 ENCOUNTER — Other Ambulatory Visit: Payer: Self-pay | Admitting: Pharmacy Technician

## 2019-04-25 NOTE — Patient Outreach (Signed)
[place] [facility]) Care Management  04/25/2019  AZRIELLE CENTNER 01-09-1929 [id number]    ADDENDUM  Care coordination call placed to [facility] in regards to patient's billing information for [employer] PAP for Xarelto.  Spoke to [place] who informed she had the information on file and a copy of the letter that was sent to the patient. Confirmed the information that [name] had was the information provided from [place] at [employer] today. [name] tried to bill the claim while I was on the phone and she was unable to get a paid claim. Informed her that Margaretha Sheffield had informed me that the claim had to be billed as a primary claim with no insurance involved just as one would bill if they had a coupon copay card. [name] informed she had years or experience in billing these types of cards and has never had this kind of rejection. She informed she would call [employer] and then she would follouwp with patient when medication was ready.  Will outreach patient's daughter with this information.  Ricki Clack P. Callan Norden, [place] Management [phone]

## 2019-04-25 NOTE — Patient Outreach (Signed)
[place] [facility]) Care Management  04/25/2019  Barbara Blackburn October 15, 1928 [id number]   Care coordination placed to [employer] in regards to patient's application for Xarelto.  Spoke to [place] who informed patient had been APPROVED 04/16/2019-04/15/2020.  Margaretha Sheffield provided the following coupon card information for patient to take to pharmacy of choice to bill claim: BIN [id number], ID [id number], GROUP [id number].  She informed the claim must be billed as a coupon or copay card and not as insurance or the claim will be denied.  Will call patient's pharmacy with this information.  Emmerich Cryer P. Alpheus Stiff, [place] Management [phone]

## 2019-04-25 NOTE — Patient Outreach (Signed)
[place] [facility]) Care Management  04/25/2019  Barbara Blackburn 06-22-1929 [id number]   Successful outreach call placed to patient's daughter Barbara Blackburn in regards to [employer] application for Xarelto.  Spoke to [name], [place] identifiers verified.  Informed Barbara Blackburn that patient had been approved and that billing information had been given to [employer]. Informed Barbara Blackburn that [employer] was having an issue in getting a paid claim but that [name] would reach out to [employer] for assistance and then to her when the medication was ready to be picked up.Barbara Blackburn informed patient had enough medication through Monday 04/29/2019 and would need medication by 04/30/2019 so she hoped the issue was resolved by that time. Informed patient to keep in touch with the pharmacy.   Inquired if patient had any other questions or concerns as it related to patient assistance and she informed she did not. Confirmed patient had name and number.  Will route note to [place] that patient assistance has been completed and will remove myself from care team.  Luiz Ochoa. Derya Dettmann, [place] Management [phone]

## 2019-04-28 ENCOUNTER — Other Ambulatory Visit: Payer: Self-pay | Admitting: Nurse Practitioner

## 2019-04-28 NOTE — Telephone Encounter (Signed)
Does she need to be restarted on these medications?

## 2019-04-28 NOTE — Telephone Encounter (Signed)
Requested medication (s) are due for refill today:no Requested medication (s) are on the active medication list: no Last refill:   Future visit scheduled: yes  Notes to clinic: The original prescription was discontinued on 11/07/2018 by Britt Bottom, CMA for the following reason: Completed Course. Renewing this prescription may not be appropriate    Requested Prescriptions  Pending Prescriptions Disp Refills   omeprazole (PRILOSEC) 20 MG capsule [Pharmacy Med Name: OMEPRAZOLE DR 20 MG CAPSULE] 30 capsule 0    Sig: Take 1 capsule (20 mg total) by mouth daily.     Gastroenterology: Proton Pump Inhibitors Passed - 04/28/2019 11:57 AM      Passed - Valid encounter within last 12 months    Recent Outpatient Visits          2 months ago Late onset Alzheimer's disease without behavioral disturbance ([place])   [place], Jolene T, NP   5 months ago Late onset Alzheimer's disease without behavioral disturbance ([place])   [place] Cannady, [place] T, NP   7 months ago Late onset Alzheimer's disease without behavioral disturbance ([place])   [place] Cannady, Jolene T, NP   10 months ago Dementia without behavioral disturbance, unspecified dementia type ([place])   [place], Jolene T, NP   1 year ago Essential hypertension, benign   [place] Kathrine Haddock, NP      Future Appointments            In 3 weeks Cannady, [name], NP [employer], [facility]            albuterol (PROVENTIL) (2.5 MG/3ML) 0.083% nebulizer solution [Pharmacy Med Name: ALBUTEROL SUL 2.5 MG/3 ML SOLN] 75 mL 0    Sig: Take 3 mLs (2.5 mg total) by nebulization every 6 (six) hours as needed for wheezing or shortness of breath.     Pulmonology:  Beta Agonists Failed - 04/28/2019 11:57 AM      Failed - One inhaler should last at least one month. If the patient is requesting refills earlier, contact the patient to check for  uncontrolled symptoms.      Passed - Valid encounter within last 12 months    Recent Outpatient Visits          2 months ago Late onset Alzheimer's disease without behavioral disturbance ([place])   [place], Jolene T, NP   5 months ago Late onset Alzheimer's disease without behavioral disturbance ([place])   [place] Cannady, Jolene T, NP   7 months ago Late onset Alzheimer's disease without behavioral disturbance ([place])   [place] Cannady, Jolene T, NP   10 months ago Dementia without behavioral disturbance, unspecified dementia type ([place])   [place], [name], NP   1 year ago Essential hypertension, benign   [place] Kathrine Haddock, NP      Future Appointments            In 3 weeks Cannady, [name], NP [employer], [facility]

## 2019-04-29 DIAGNOSIS — D649 Anemia, unspecified: Secondary | ICD-10-CM | POA: Diagnosis not present

## 2019-04-29 DIAGNOSIS — G301 Alzheimer's disease with late onset: Secondary | ICD-10-CM | POA: Diagnosis not present

## 2019-04-29 DIAGNOSIS — Z7901 Long term (current) use of anticoagulants: Secondary | ICD-10-CM | POA: Diagnosis not present

## 2019-04-29 DIAGNOSIS — I509 Heart failure, unspecified: Secondary | ICD-10-CM | POA: Diagnosis not present

## 2019-04-29 DIAGNOSIS — Z9181 History of falling: Secondary | ICD-10-CM | POA: Diagnosis not present

## 2019-04-29 DIAGNOSIS — M150 Primary generalized (osteo)arthritis: Secondary | ICD-10-CM | POA: Diagnosis not present

## 2019-04-29 DIAGNOSIS — I4891 Unspecified atrial fibrillation: Secondary | ICD-10-CM | POA: Diagnosis not present

## 2019-04-29 DIAGNOSIS — E785 Hyperlipidemia, unspecified: Secondary | ICD-10-CM | POA: Diagnosis not present

## 2019-04-29 DIAGNOSIS — I110 Hypertensive heart disease with heart failure: Secondary | ICD-10-CM | POA: Diagnosis not present

## 2019-05-01 DIAGNOSIS — Z7901 Long term (current) use of anticoagulants: Secondary | ICD-10-CM | POA: Diagnosis not present

## 2019-05-01 DIAGNOSIS — I509 Heart failure, unspecified: Secondary | ICD-10-CM | POA: Diagnosis not present

## 2019-05-01 DIAGNOSIS — D649 Anemia, unspecified: Secondary | ICD-10-CM | POA: Diagnosis not present

## 2019-05-01 DIAGNOSIS — G301 Alzheimer's disease with late onset: Secondary | ICD-10-CM | POA: Diagnosis not present

## 2019-05-01 DIAGNOSIS — E785 Hyperlipidemia, unspecified: Secondary | ICD-10-CM | POA: Diagnosis not present

## 2019-05-01 DIAGNOSIS — M150 Primary generalized (osteo)arthritis: Secondary | ICD-10-CM | POA: Diagnosis not present

## 2019-05-01 DIAGNOSIS — I110 Hypertensive heart disease with heart failure: Secondary | ICD-10-CM | POA: Diagnosis not present

## 2019-05-01 DIAGNOSIS — I4891 Unspecified atrial fibrillation: Secondary | ICD-10-CM | POA: Diagnosis not present

## 2019-05-01 DIAGNOSIS — Z9181 History of falling: Secondary | ICD-10-CM | POA: Diagnosis not present

## 2019-05-02 ENCOUNTER — Ambulatory Visit: Payer: Medicare Other | Admitting: Licensed Clinical Social Worker

## 2019-05-02 ENCOUNTER — Other Ambulatory Visit: Payer: Self-pay

## 2019-05-02 DIAGNOSIS — F0280 Dementia in other diseases classified elsewhere without behavioral disturbance: Secondary | ICD-10-CM

## 2019-05-02 NOTE — Chronic Care Management (AMB) (Signed)
Chronic Care Management    Clinical Social Work Follow Up Note  05/02/2019 Name: Barbara Blackburn MRN: [id number] DOB: [birth year]  Barbara Blackburn is a 85 y.o. year old female who is a primary care patient of Cannady, [name], NP. The CCM team was consulted for assistance with [employer] .   Review of patient status, including review of consultants reports, other relevant assessments, and collaboration with appropriate care team members and the patient's provider was performed as part of comprehensive patient evaluation and provision of chronic care management services.    SDOH (Social Determinants of Health) screening performed today: Stress. See Care Plan for related entries.   Outpatient Encounter Medications as of 05/02/2019  Medication Sig Note  . albuterol (PROVENTIL) (2.5 MG/3ML) 0.083% nebulizer solution Take 3 mLs (2.5 mg total) by nebulization every 6 (six) hours as needed for wheezing or shortness of breath.   . busPIRone (BUSPAR) 5 MG tablet Take 5 mg by mouth daily as needed.  04/08/2019: Takes 2.5 mg if during day, 5 mg at night   . Cholecalciferol (VITAMIN D) 2000 units tablet Take 2,000 Units by mouth daily.   . furosemide (LASIX) 40 MG tablet Take 3 tablets (120 mg) by mouth once every other day in the morning   . galantamine (RAZADYNE) 4 MG tablet Take 1 tablet (4 mg total) by mouth 2 (two) times daily with a meal.   . losartan (COZAAR) 25 MG tablet Take 1 tablet (25 mg total) by mouth daily.   . metoprolol succinate (TOPROL-XL) 25 MG 24 hr tablet Take 1 tablet (25 mg total) by mouth daily.   . Multiple Vitamin (MULTIVITAMIN) capsule Take 1 capsule by mouth daily.   [name] omeprazole (PRILOSEC) 20 MG capsule Take 1 capsule (20 mg total) by mouth daily.   . rivaroxaban (XARELTO) 20 MG TABS tablet TAKE 1 TABLET BY MOUTH  DAILY WITH SUPPER   . rosuvastatin (CRESTOR) 10 MG tablet Take 1 tablet (10 mg total) by mouth daily.   . sertraline (ZOLOFT) 50 MG tablet Take 1 tablet  (50 mg total) by mouth daily.   [name] spironolactone (ALDACTONE) 25 MG tablet Take 0.5 tablets (12.5 mg total) by mouth daily.    No facility-administered encounter medications on file as of 05/02/2019.      Goals Addressed    . SW"Mother needs ore socialization/activity to help with her dementia/mobility" (pt-stated)       Current Barriers:  . Limited social support . ADL IADL limitations . Social Isolation . Memory Deficits . Inability to perform ADL's independently . Inability to perform IADL's independently  Clinical Social Work Clinical Goal(s):  [name] Over the next 90 days, client will work with SW to address concerns related to gaining additional support/resources   Interventions: . Patient interviewed and appropriate assessments performed . Provided patient with information about available socialization resources within the area. Dementia support resource education provided as well.  . Discussed plans with patient for ongoing care management follow up and provided patient with direct contact information for care management team . Advised patient to contact CCM team for any urgent concerns . Assisted patient/caregiver with obtaining information about health plan benefits . Provided education and assistance to client regarding Advanced Directives. . Provided education to patient/caregiver regarding level of care options.  Patient Self Care Activities:  . Attends all scheduled provider appointments . Calls provider office for new concerns or questions  Initial goal documentation     Follow Up Plan: SW will  follow up with patient by phone over the next 45 days  Eula Fried, [place], MSW, [email or web address] Phone: [phone]

## 2019-05-06 ENCOUNTER — Telehealth: Payer: Self-pay

## 2019-05-07 ENCOUNTER — Telehealth: Payer: Self-pay

## 2019-05-07 ENCOUNTER — Ambulatory Visit (INDEPENDENT_AMBULATORY_CARE_PROVIDER_SITE_OTHER): Payer: Medicare Other

## 2019-05-07 DIAGNOSIS — E785 Hyperlipidemia, unspecified: Secondary | ICD-10-CM | POA: Diagnosis not present

## 2019-05-07 DIAGNOSIS — I5022 Chronic systolic (congestive) heart failure: Secondary | ICD-10-CM

## 2019-05-07 DIAGNOSIS — Z95810 Presence of automatic (implantable) cardiac defibrillator: Secondary | ICD-10-CM

## 2019-05-07 DIAGNOSIS — D649 Anemia, unspecified: Secondary | ICD-10-CM | POA: Diagnosis not present

## 2019-05-07 DIAGNOSIS — Z9181 History of falling: Secondary | ICD-10-CM | POA: Diagnosis not present

## 2019-05-07 DIAGNOSIS — M150 Primary generalized (osteo)arthritis: Secondary | ICD-10-CM | POA: Diagnosis not present

## 2019-05-07 DIAGNOSIS — I509 Heart failure, unspecified: Secondary | ICD-10-CM | POA: Diagnosis not present

## 2019-05-07 DIAGNOSIS — I4891 Unspecified atrial fibrillation: Secondary | ICD-10-CM | POA: Diagnosis not present

## 2019-05-07 DIAGNOSIS — Z7901 Long term (current) use of anticoagulants: Secondary | ICD-10-CM | POA: Diagnosis not present

## 2019-05-07 DIAGNOSIS — I110 Hypertensive heart disease with heart failure: Secondary | ICD-10-CM | POA: Diagnosis not present

## 2019-05-07 DIAGNOSIS — G301 Alzheimer's disease with late onset: Secondary | ICD-10-CM | POA: Diagnosis not present

## 2019-05-09 DIAGNOSIS — E785 Hyperlipidemia, unspecified: Secondary | ICD-10-CM | POA: Diagnosis not present

## 2019-05-09 DIAGNOSIS — I110 Hypertensive heart disease with heart failure: Secondary | ICD-10-CM | POA: Diagnosis not present

## 2019-05-09 DIAGNOSIS — G301 Alzheimer's disease with late onset: Secondary | ICD-10-CM | POA: Diagnosis not present

## 2019-05-09 DIAGNOSIS — I4891 Unspecified atrial fibrillation: Secondary | ICD-10-CM | POA: Diagnosis not present

## 2019-05-09 DIAGNOSIS — Z9181 History of falling: Secondary | ICD-10-CM | POA: Diagnosis not present

## 2019-05-09 DIAGNOSIS — I509 Heart failure, unspecified: Secondary | ICD-10-CM | POA: Diagnosis not present

## 2019-05-09 DIAGNOSIS — Z7901 Long term (current) use of anticoagulants: Secondary | ICD-10-CM | POA: Diagnosis not present

## 2019-05-09 DIAGNOSIS — M150 Primary generalized (osteo)arthritis: Secondary | ICD-10-CM | POA: Diagnosis not present

## 2019-05-09 DIAGNOSIS — D649 Anemia, unspecified: Secondary | ICD-10-CM | POA: Diagnosis not present

## 2019-05-09 NOTE — Progress Notes (Signed)
EPIC Encounter for ICM Monitoring  Patient Name: Barbara Blackburn is a 85 y.o. female Date: 05/09/2019 Primary Care Physican: Barbara Lick, NP Primary Cardiologist:Barbara Blackburn Electrophysiologist:Barbara Blackburn 03/26/2019 Weight: 162.2 lbs 04/01/2019 Weight: 166 lbs 04/04/2019 Weight: 164.8 lbs   Spoke with daughter Barbara Blackburn.  She reported patient is feeling fine.  Patient is limiting fluid intake to 64 oz daily and thinks she is limiting salt.  Daughter cooks for patient and does not add salt to foods.  OptivolThoracic impedancesuggesting possible fluid accumulation since 04/23/2019.  Prescribed: Furosemide40 mgTake 3 tablets (120 mg) by mouth once every other day in the morning.  Labs: 01/16/2019 Creatinine 0.89, BUN 20, Potassium 4.2, Sodium 141, GFR 58-67 11/07/2018 Creatinine0.78, BUN23, Potassium3.9, [id number], [id number] A complete set of results can be found in Results Review.  Recommendations:  Advised to take Furosemide 3 tablets on the off day x 1 then resume every other day.  Follow-up plan: ICM clinic phone appointment on 05/13/2019.   91 day device clinic remote transmission 06/24/2019.     Copy of ICM check sent to Dr. Caryl Blackburn.   3 month ICM trend: 05/07/2019    1 Year ICM trend:       Barbara Billings, RN 05/09/2019 10:26 AM

## 2019-05-12 DIAGNOSIS — M150 Primary generalized (osteo)arthritis: Secondary | ICD-10-CM | POA: Diagnosis not present

## 2019-05-12 DIAGNOSIS — I110 Hypertensive heart disease with heart failure: Secondary | ICD-10-CM | POA: Diagnosis not present

## 2019-05-12 DIAGNOSIS — Z9181 History of falling: Secondary | ICD-10-CM | POA: Diagnosis not present

## 2019-05-12 DIAGNOSIS — I509 Heart failure, unspecified: Secondary | ICD-10-CM | POA: Diagnosis not present

## 2019-05-12 DIAGNOSIS — G301 Alzheimer's disease with late onset: Secondary | ICD-10-CM | POA: Diagnosis not present

## 2019-05-12 DIAGNOSIS — E785 Hyperlipidemia, unspecified: Secondary | ICD-10-CM | POA: Diagnosis not present

## 2019-05-12 DIAGNOSIS — Z7901 Long term (current) use of anticoagulants: Secondary | ICD-10-CM | POA: Diagnosis not present

## 2019-05-12 DIAGNOSIS — I4891 Unspecified atrial fibrillation: Secondary | ICD-10-CM | POA: Diagnosis not present

## 2019-05-12 DIAGNOSIS — D649 Anemia, unspecified: Secondary | ICD-10-CM | POA: Diagnosis not present

## 2019-05-13 ENCOUNTER — Ambulatory Visit (INDEPENDENT_AMBULATORY_CARE_PROVIDER_SITE_OTHER): Payer: Medicare Other

## 2019-05-13 DIAGNOSIS — Z95810 Presence of automatic (implantable) cardiac defibrillator: Secondary | ICD-10-CM

## 2019-05-13 DIAGNOSIS — I5022 Chronic systolic (congestive) heart failure: Secondary | ICD-10-CM

## 2019-05-13 NOTE — Progress Notes (Signed)
EPIC Encounter for ICM Monitoring  Patient Name: Barbara Blackburn is a 85 y.o. female Date: 05/13/2019 Primary Care Physican: Barbara Lick, NP Primary Cardiologist:Barbara Blackburn Electrophysiologist:Barbara Blackburn 04/04/2019 Weight: 164.8 lbs 05/13/2019 Weight: 163 lbs    Spoke with daughter Barbara Blackburn.   Patient is doing well.    OptivolThoracic impedancereturned to normal after taking extra Furosemide x 1 day.   Prescribed: Furosemide40 mgTake 3 tablets (120 mg) by mouth once every other day in the morning.  Labs: 01/16/2019 Creatinine 0.89, BUN 20, Potassium 4.2, Sodium 141, GFR 58-67 11/07/2018 Creatinine0.78, BUN23, Potassium3.9, [id number], [id number] A complete set of results can be found in Results Review.  Recommendations: No changes and encouraged to call if experiencing any fluid symptoms.  Follow-up plan: ICM clinic phone appointment on 06/25/2019.   91 day device clinic remote transmission 06/24/2019.   Copy of ICM check sent to Dr. Caryl Blackburn.   3 month ICM trend: 05/13/2019    1 Year ICM trend:       Barbara Billings, RN 05/13/2019 12:14 PM

## 2019-05-14 DIAGNOSIS — I509 Heart failure, unspecified: Secondary | ICD-10-CM | POA: Diagnosis not present

## 2019-05-14 DIAGNOSIS — D649 Anemia, unspecified: Secondary | ICD-10-CM | POA: Diagnosis not present

## 2019-05-14 DIAGNOSIS — M150 Primary generalized (osteo)arthritis: Secondary | ICD-10-CM | POA: Diagnosis not present

## 2019-05-14 DIAGNOSIS — I4891 Unspecified atrial fibrillation: Secondary | ICD-10-CM | POA: Diagnosis not present

## 2019-05-14 DIAGNOSIS — E785 Hyperlipidemia, unspecified: Secondary | ICD-10-CM | POA: Diagnosis not present

## 2019-05-14 DIAGNOSIS — G301 Alzheimer's disease with late onset: Secondary | ICD-10-CM | POA: Diagnosis not present

## 2019-05-14 DIAGNOSIS — Z9181 History of falling: Secondary | ICD-10-CM | POA: Diagnosis not present

## 2019-05-14 DIAGNOSIS — I110 Hypertensive heart disease with heart failure: Secondary | ICD-10-CM | POA: Diagnosis not present

## 2019-05-14 DIAGNOSIS — Z7901 Long term (current) use of anticoagulants: Secondary | ICD-10-CM | POA: Diagnosis not present

## 2019-05-20 ENCOUNTER — Telehealth: Payer: Self-pay

## 2019-05-20 ENCOUNTER — Ambulatory Visit: Payer: Medicare Other | Admitting: Nurse Practitioner

## 2019-05-20 DIAGNOSIS — Z7901 Long term (current) use of anticoagulants: Secondary | ICD-10-CM | POA: Diagnosis not present

## 2019-05-20 DIAGNOSIS — E785 Hyperlipidemia, unspecified: Secondary | ICD-10-CM | POA: Diagnosis not present

## 2019-05-20 DIAGNOSIS — I509 Heart failure, unspecified: Secondary | ICD-10-CM | POA: Diagnosis not present

## 2019-05-20 DIAGNOSIS — G301 Alzheimer's disease with late onset: Secondary | ICD-10-CM | POA: Diagnosis not present

## 2019-05-20 DIAGNOSIS — I4891 Unspecified atrial fibrillation: Secondary | ICD-10-CM | POA: Diagnosis not present

## 2019-05-20 DIAGNOSIS — I110 Hypertensive heart disease with heart failure: Secondary | ICD-10-CM | POA: Diagnosis not present

## 2019-05-20 DIAGNOSIS — D649 Anemia, unspecified: Secondary | ICD-10-CM | POA: Diagnosis not present

## 2019-05-20 DIAGNOSIS — Z9181 History of falling: Secondary | ICD-10-CM | POA: Diagnosis not present

## 2019-05-20 DIAGNOSIS — M150 Primary generalized (osteo)arthritis: Secondary | ICD-10-CM | POA: Diagnosis not present

## 2019-05-21 ENCOUNTER — Ambulatory Visit: Payer: Self-pay | Admitting: *Deleted

## 2019-05-21 ENCOUNTER — Encounter: Payer: Self-pay | Admitting: Nurse Practitioner

## 2019-05-21 ENCOUNTER — Ambulatory Visit (INDEPENDENT_AMBULATORY_CARE_PROVIDER_SITE_OTHER): Payer: Medicare Other | Admitting: Nurse Practitioner

## 2019-05-21 ENCOUNTER — Other Ambulatory Visit: Payer: Self-pay

## 2019-05-21 VITALS — BP 112/63 | HR 70 | Temp 98.4°F | Ht 61.0 in | Wt 163.5 lb

## 2019-05-21 DIAGNOSIS — F0280 Dementia in other diseases classified elsewhere without behavioral disturbance: Secondary | ICD-10-CM

## 2019-05-21 DIAGNOSIS — G301 Alzheimer's disease with late onset: Secondary | ICD-10-CM

## 2019-05-21 DIAGNOSIS — I5022 Chronic systolic (congestive) heart failure: Secondary | ICD-10-CM | POA: Diagnosis not present

## 2019-05-21 DIAGNOSIS — D692 Other nonthrombocytopenic purpura: Secondary | ICD-10-CM | POA: Diagnosis not present

## 2019-05-21 DIAGNOSIS — Z23 Encounter for immunization: Secondary | ICD-10-CM

## 2019-05-21 DIAGNOSIS — I4821 Permanent atrial fibrillation: Secondary | ICD-10-CM

## 2019-05-21 DIAGNOSIS — E7800 Pure hypercholesterolemia, unspecified: Secondary | ICD-10-CM

## 2019-05-21 DIAGNOSIS — I10 Essential (primary) hypertension: Secondary | ICD-10-CM | POA: Diagnosis not present

## 2019-05-21 DIAGNOSIS — M1711 Unilateral primary osteoarthritis, right knee: Secondary | ICD-10-CM | POA: Diagnosis not present

## 2019-05-21 NOTE — Progress Notes (Signed)
BP 112/63   Pulse 70   Temp 98.4 F (36.9 C) (Oral)   Ht 5\' 1"  (1.549 m)   Wt 163 lb 8 oz (74.2 kg)   LMP  (LMP Unknown)   SpO2 95%   BMI 30.89 kg/m    Subjective:    Patient ID: Barbara Blackburn, female    DOB: [birth year], 85 y.o.   MRN: [id number]  HPI: Barbara Blackburn is a 85 y.o. female  Chief Complaint  Patient presents with  . Hypertension  . Hyperlipidemia  . Dementia   HYPERTENSION / HYPERLIPIDEMIA WITH ATRIAL FIBRILLATION Continues on Crestor, Losartan, Lasix, and Imdur.  Last saw cardiology 04/15/2019.  They restarted her BB, Metoprolol succinate 25 MG, decreased her Losartan to 25 MG Aldactone 12.5 MG.  Taking Lasix 40 MG BID, which her daughter reports helps her mobility.  Continues on Crestor.   Satisfied with current treatment? yes Duration of hypertension: chronic BP monitoring frequency: daily BP range: 90/50 at times, 120-130/60-70 on average BP medication side effects: no Duration of hyperlipidemia: chronic Cholesterol medication side effects: no Cholesterol supplements: none Medication compliance: good compliance Aspirin: no Recent stressors: no Recurrent headaches: no Visual changes: no Palpitations: no Dyspnea: no Chest pain: no Lower extremity edema: no Dizzy/lightheaded: no   DEMENTIA:  Chronic, progressive in nature. Saw neurology 02/17/19, they started her Namenda along with her Galantamine.  Her daughter reports the Namenda made her mean, so she stopped giving it to her.  They also prescribed Buspar as needed for anxiety and her daughter has been giving her 1/2 pill PRN as whole 5 MG is too strong.  Patient and daughter deny N&V, CP, dizziness, syncope, headache, fatigue, or decreased appetite at this time. Patient continues to eat well at home per her daughter report, no recent falls.Has history of fall last year with injury.She lives with daughter in apartment attached to house.  Has had some weight loss over past year, but daughter  weighs daily and appears to be stabilizing at this time, daughter has been providing her Ensure as instructed.  Discussed with daughter that since patient likes ice cream, she can even turn Ensure into an ice cream and add something sweet to it so patient will take in more. Continues with home PT.  Relevant past medical, surgical, family and social history reviewed and updated as indicated. Interim medical history since our last visit reviewed. Allergies and medications reviewed and updated.  Review of Systems  Constitutional: Negative for activity change, appetite change, diaphoresis, fatigue and fever.  Respiratory: Negative for cough, chest tightness and shortness of breath.   Cardiovascular: Negative for chest pain, palpitations and leg swelling.  Gastrointestinal: Negative for abdominal distention, abdominal pain, constipation, diarrhea, nausea and vomiting.  Neurological: Negative for dizziness, syncope, weakness, light-headedness, numbness and headaches.  Psychiatric/Behavioral: Negative.     Per HPI unless specifically indicated above     Objective:    BP 112/63   Pulse 70   Temp 98.4 F (36.9 C) (Oral)   Ht 5\' 1"  (1.549 m)   Wt 163 lb 8 oz (74.2 kg)   LMP  (LMP Unknown)   SpO2 95%   BMI 30.89 kg/m   Wt Readings from Last 3 Encounters:  05/21/19 163 lb 8 oz (74.2 kg)  04/15/19 163 lb (73.9 kg)  04/07/19 165 lb (74.8 kg)    Physical Exam Vitals signs and nursing note reviewed.  Constitutional:      General: She is awake. She is not  in acute distress.    Appearance: She is well-developed. She is not ill-appearing.  HENT:     Head: Normocephalic.     Right Ear: Hearing normal.     Left Ear: Hearing normal.  Eyes:     General: Lids are normal.        Right eye: No discharge.        Left eye: No discharge.     Conjunctiva/sclera: Conjunctivae normal.     Pupils: Pupils are equal, round, and reactive to light.  Neck:     Musculoskeletal: Normal range of motion and  neck supple.     Thyroid: No thyromegaly.     Vascular: No carotid bruit.  Cardiovascular:     Rate and Rhythm: Normal rate and regular rhythm.     Heart sounds: Normal heart sounds. No murmur. No gallop.   Pulmonary:     Effort: Pulmonary effort is normal. No accessory muscle usage or respiratory distress.     Breath sounds: Normal breath sounds.  Abdominal:     General: Bowel sounds are normal.     Palpations: Abdomen is soft.  Musculoskeletal:     Right lower leg: No edema.     Left lower leg: No edema.  Skin:    General: Skin is warm and dry.     Comments: Multiple areas of pale purple bruising BUE.  Neurological:     Mental Status: She is alert.     Comments: Patient unable to state year, month, day.  Also reported her daughter was her nurse.  She was able to discuss her recent birthday party.  Psychiatric:        Attention and Perception: Attention normal.        Mood and Affect: Mood normal.        Behavior: Behavior is cooperative.     Results for orders placed or performed in visit on 04/15/19  [facility] [place]  Result Value Ref Range   Date Time Interrogation Session [id number]    Pulse Generator Manufacturer [employer]    Pulse Gen Model [id number] Evera XT DR    Pulse Gen Serial Number [id number]    Clinic Name [facility]    Implantable Pulse Generator Type Implantable Cardiac Defibulator    Implantable Pulse Generator Implant Date HC:329350    Implantable Lead Manufacturer [facility]    Implantable Lead Model 5076 CapSureFix Novus    Implantable Lead Serial Number [id number]    Implantable Lead Implant Date KF:[id number]    Implantable Lead Location Detail 1 APPENDAGE    Implantable Lead Location [id number]    Implantable Lead Manufacturer [facility]    Implantable Lead Model [id number] Sprint Fidelis    Implantable Lead Serial Number [id number]    Implantable Lead Implant Date AK:3695378    Implantable Lead Location Detail 1 APEX    Implantable Lead Location [id number]     Implantable Lead Manufacturer [employer]    Implantable Lead Model 5092 CapSure SP Novus    Implantable Lead Serial Number [id number]    Implantable Lead Implant Date VO:6580032    Implantable Lead Location [phone]    Lead Channel Setting Sensing Sensitivity 0.3 mV   Lead Channel Setting Pacing Pulse Width 0.4 ms   Lead Channel Setting Pacing Amplitude 2.5 V   Lead Channel Impedance Value 399 ohm   Lead Channel Sensing Intrinsic Amplitude 0.75 mV   Lead Channel Sensing Intrinsic Amplitude 0.375 mV   Lead Channel Impedance Value 456 ohm   Lead  Channel Impedance Value 380 ohm   Lead Channel Sensing Intrinsic Amplitude 1.75 mV   Lead Channel Sensing Intrinsic Amplitude 0.75 mV   Lead Channel Pacing Threshold Amplitude 0.625 V   Lead Channel Pacing Threshold Pulse Width 0.4 ms   HighPow Impedance 44 ohm   HighPow Impedance 59 ohm   Battery Status OK    Battery Remaining Longevity 101 mo   Battery Voltage 3 V   Brady Statistic RA Percent Paced 0 %   Brady Statistic RV Percent Paced 6.36 %   Brady Statistic AP VP Percent 0 %   Brady Statistic AS VP Percent 0 %   Brady Statistic AP VS Percent 0 %   Brady Statistic AS VS Percent 0 %      Assessment & Plan:   Problem List Items Addressed This Visit      Cardiovascular and Mediastinum   ATRIAL FIBRILLATION    Chronic, ongoing.  Continue current medication regimen and collaboration with cardiology.        Essential hypertension, benign    Chronic, ongoing with BP below goal.  Continue current medication regimen and collaboration with cardiology.  Avoid hypotension d/t cognitive status and risk for falls.        Purpura senilis ([place])    Recommend monitoring skin for breakdown and using gentle skin cleanser at home.  She is on Xarelto.        Nervous and Auditory   Dementia ([place]) - Primary    Chronic, progressive.  Continue to monitor weight.  Continue Galantamine, Sertraline, and as needed Buspar.  Home PT for strengthening.  Monitor for  falls and report if increased noted.  Continue collaboration with neurology.        Other   Hypercholesterolemia    Chronic, ongoing.  Continue current medication regimen.  Obtain lipid panel next visit.         Other Visit Diagnoses    Flu vaccine need       Relevant Orders   Flu Vaccine QUAD High Dose(Fluad) (Completed)       Follow up plan: Return in about 6 months (around 11/18/2019) for Dementia, HTN/HLD, Mood.

## 2019-05-21 NOTE — Assessment & Plan Note (Signed)
Chronic, ongoing.  Continue current medication regimen and collaboration with cardiology. 

## 2019-05-21 NOTE — Assessment & Plan Note (Signed)
Chronic, ongoing.  Continue current medication regimen.  Obtain lipid panel next visit.   

## 2019-05-21 NOTE — Patient Instructions (Signed)

## 2019-05-21 NOTE — Assessment & Plan Note (Signed)
Recommend monitoring skin for breakdown and using gentle skin cleanser at home.  She is on Xarelto.

## 2019-05-21 NOTE — Assessment & Plan Note (Signed)
Chronic, progressive.  Continue to monitor weight.  Continue Galantamine, Sertraline, and as needed Buspar.  Home PT for strengthening.  Monitor for falls and report if increased noted.  Continue collaboration with neurology.

## 2019-05-21 NOTE — Assessment & Plan Note (Signed)
Chronic, ongoing with BP below goal.  Continue current medication regimen and collaboration with cardiology.  Avoid hypotension d/t cognitive status and risk for falls.   

## 2019-05-22 DIAGNOSIS — I110 Hypertensive heart disease with heart failure: Secondary | ICD-10-CM | POA: Diagnosis not present

## 2019-05-22 DIAGNOSIS — I4891 Unspecified atrial fibrillation: Secondary | ICD-10-CM | POA: Diagnosis not present

## 2019-05-22 DIAGNOSIS — M150 Primary generalized (osteo)arthritis: Secondary | ICD-10-CM | POA: Diagnosis not present

## 2019-05-22 DIAGNOSIS — Z9181 History of falling: Secondary | ICD-10-CM | POA: Diagnosis not present

## 2019-05-22 DIAGNOSIS — G301 Alzheimer's disease with late onset: Secondary | ICD-10-CM | POA: Diagnosis not present

## 2019-05-22 DIAGNOSIS — E785 Hyperlipidemia, unspecified: Secondary | ICD-10-CM | POA: Diagnosis not present

## 2019-05-22 DIAGNOSIS — D649 Anemia, unspecified: Secondary | ICD-10-CM | POA: Diagnosis not present

## 2019-05-22 DIAGNOSIS — I509 Heart failure, unspecified: Secondary | ICD-10-CM | POA: Diagnosis not present

## 2019-05-22 DIAGNOSIS — Z7901 Long term (current) use of anticoagulants: Secondary | ICD-10-CM | POA: Diagnosis not present

## 2019-05-23 ENCOUNTER — Ambulatory Visit (INDEPENDENT_AMBULATORY_CARE_PROVIDER_SITE_OTHER): Payer: Medicare Other | Admitting: Pharmacist

## 2019-05-23 DIAGNOSIS — G301 Alzheimer's disease with late onset: Secondary | ICD-10-CM | POA: Diagnosis not present

## 2019-05-23 DIAGNOSIS — F0280 Dementia in other diseases classified elsewhere without behavioral disturbance: Secondary | ICD-10-CM

## 2019-05-23 DIAGNOSIS — I5022 Chronic systolic (congestive) heart failure: Secondary | ICD-10-CM | POA: Diagnosis not present

## 2019-05-23 NOTE — Chronic Care Management (AMB) (Signed)
Chronic Care Management   Follow Up Note   05/23/2019 Name: Barbara Blackburn MRN: [id number] DOB: [birth year]  Referred by: Barbara Lick, NP Reason for referral : Chronic Care Management (Medication Management )   Barbara Blackburn is a 85 y.o. year old female who is a primary care patient of Cannady, [name], NP. The CCM team was consulted for assistance with chronic disease management and care coordination needs.    Contacted patient's daughter, Barbara Blackburn, for medication management review today.   Review of patient status, including review of consultants reports, relevant laboratory and other test results, and collaboration with appropriate care team members and the patient's provider was performed as part of comprehensive patient evaluation and provision of chronic care management services.    SDOH (Social Determinants of Health) screening performed today: Stress. See Care Plan for related entries.   Advanced Directives Status: N See Care Plan and Vynca application for related entries.  Outpatient Encounter Medications as of 05/23/2019  Medication Sig Note  . busPIRone (BUSPAR) 5 MG tablet Take 5 mg by mouth daily as needed. Taking 2.5 mg once a day 04/08/2019: Takes 2.5 mg if during day, 5 mg at night   . furosemide (LASIX) 40 MG tablet Take 3 tablets (120 mg) by mouth once every other day in the morning 05/21/2019: Patient taking differently. Twice one tablet daily (80 mg total)  . galantamine (RAZADYNE) 4 MG tablet Take 1 tablet (4 mg total) by mouth 2 (two) times daily with a meal.   . losartan (COZAAR) 25 MG tablet Take 1 tablet (25 mg total) by mouth daily.   . metoprolol succinate (TOPROL-XL) 25 MG 24 hr tablet Take 1 tablet (25 mg total) by mouth daily.   . rivaroxaban (XARELTO) 20 MG TABS tablet TAKE 1 TABLET BY MOUTH  DAILY WITH SUPPER   . spironolactone (ALDACTONE) 25 MG tablet Take 0.5 tablets (12.5 mg total) by mouth daily.   . Cholecalciferol (VITAMIN D) 2000 units  tablet Take 2,000 Units by mouth daily.   . Multiple Vitamin (MULTIVITAMIN) capsule Take 1 capsule by mouth daily.   . rosuvastatin (CRESTOR) 10 MG tablet Take 1 tablet (10 mg total) by mouth daily.   . sertraline (ZOLOFT) 50 MG tablet Take 1 tablet (50 mg total) by mouth daily.    No facility-administered encounter medications on file as of 05/23/2019.      Goals Addressed            This Visit's Progress     Patient Stated   . COMPLETED: "She's in the donut hole" (pt-stated)       Current Barriers:  . Financial Barriers: patient has [employer] and is in [employer]; Reports copay for Xarelto is ~$144/month. Applied for an APPROVED for [name] assistance through the end of 2020. Patient's daughter notes that she is appreciative of the assistance.   Pharmacist Clinical Goal(s):  [name] Over the next 30 days, patient will work with PharmD and providers to relieve medication access concerns  Interventions: . Barbara Blackburn aware of program expiration. No other medication access needs at this time.   Patient Self Care Activities:  . Patient will provide necessary portions of application   Please see past updates related to this goal by clicking on the "Past Updates" button in the selected goal      . PharmD "We want her to stay healthy" (pt-stated)         Plan:  - Will  outreach patient in the next 5-6 weeks for continued medication management support  Barbara Blackburn, PharmD Clinical Pharmacist [place] [phone]

## 2019-05-23 NOTE — Chronic Care Management (AMB) (Signed)
Chronic Care Management   Follow Up Note   05/23/2019 Name: Barbara Blackburn MRN: [id number] DOB: 12-12-1928  Referred by: Barbara Lick, NP Reason for referral : Chronic Care Management (Caregiver Strain)   Barbara Blackburn is a 85 y.o. year old female who is a primary care patient of Cannady, [name], NP. The CCM team was consulted for assistance with chronic disease management and care coordination needs.    Review of patient status, including review of consultants reports, relevant laboratory and other test results, and collaboration with appropriate care team members and the patient's provider was performed as part of comprehensive patient evaluation and provision of chronic care management services.    SDOH (Social Determinants of Health) screening performed today: Physical Activity. See Care Plan for related entries.   Advanced Directives Status: N See Care Plan and Vynca application for related entries.  Outpatient Encounter Medications as of 05/21/2019  Medication Sig Note  . busPIRone (BUSPAR) 5 MG tablet Take 5 mg by mouth daily as needed. Taking 2.5 mg once a day 04/08/2019: Takes 2.5 mg if during day, 5 mg at night   . Cholecalciferol (VITAMIN D) 2000 units tablet Take 2,000 Units by mouth daily.   . furosemide (LASIX) 40 MG tablet Take 3 tablets (120 mg) by mouth once every other day in the morning 05/21/2019: Patient taking differently. Twice one tablet daily (80 mg total)  . galantamine (RAZADYNE) 4 MG tablet Take 1 tablet (4 mg total) by mouth 2 (two) times daily with a meal.   . losartan (COZAAR) 25 MG tablet Take 1 tablet (25 mg total) by mouth daily.   . metoprolol succinate (TOPROL-XL) 25 MG 24 hr tablet Take 1 tablet (25 mg total) by mouth daily.   . Multiple Vitamin (MULTIVITAMIN) capsule Take 1 capsule by mouth daily.   . rivaroxaban (XARELTO) 20 MG TABS tablet TAKE 1 TABLET BY MOUTH  DAILY WITH SUPPER   . rosuvastatin (CRESTOR) 10 MG tablet Take 1 tablet (10  mg total) by mouth daily.   . sertraline (ZOLOFT) 50 MG tablet Take 1 tablet (50 mg total) by mouth daily.   [name] spironolactone (ALDACTONE) 25 MG tablet Take 0.5 tablets (12.5 mg total) by mouth daily.    No facility-administered encounter medications on file as of 05/21/2019.      Goals Addressed            This Visit's Progress   . Physical therepy needs (pt-stated)       Current Barriers:  [name] Knowledge Deficits related to obtaining a [place] [facility] PT   Nurse Case Manager Clinical Goal(s):  [name] Over the next 30  days, patient will work with [facility]  to address needs related to PT referral   Interventions:  . Discussed plans with patient for ongoing care management follow up and provided patient with direct contact information for care management team . Patient with Memory issues, daughter the main caregiver. Daughter does state she is taking a small break this week end and recognizes the importance of caring for herself. - Daughter reports doing well at this time . Patient doing crafts and word searches to assist with memory.  . Patient has completed HH PT through [facility] and daughter reports this was very helpful. Daughter reports patient was able to obtain a steroid injection in her knee that is helpful for about a week. . Discussed with daughter and patient the importance of monitoring patient's weight related to hx of heart failure.  Patient Self Care Activities:  . Currently UNABLE TO independently obtain HH PT- Goal Met  Please see past updates related to this goal by clicking on the "Past Updates" button in the selected goal          The care management team will reach out to the patient again over the next 30 days.  The patient has been provided with contact information for the care management team and has been advised to call with any health related questions or concerns.   Merlene Morse Jaycen Vercher RN, BSN Nurse Case Editor, [facility]  [phone])  [employer]

## 2019-05-23 NOTE — Patient Instructions (Signed)
Visit Information  Goals Addressed            This Visit's Progress     Patient Stated   . COMPLETED: "She's in the donut hole" (pt-stated)       Current Barriers:  . Financial Barriers: patient has [employer] and is in [employer]; Reports copay for Xarelto is ~$144/month. Applied for an APPROVED for [name] assistance through the end of 2020. Patient's daughter notes that she is appreciative of the assistance.   Pharmacist Clinical Goal(s):  [name] Over the next 30 days, patient will work with PharmD and providers to relieve medication access concerns  Interventions: . [name] aware of program expiration. No other medication access needs at this time.   Patient Self Care Activities:  . Patient will provide necessary portions of application   Please see past updates related to this goal by clicking on the "Past Updates" button in the selected goal      . PharmD "We want her to stay healthy" (pt-stated)       Current Barriers:  . Polypharmacy; complex patient with multiple comorbidities including dementia, HF, Afib, ICD placement, osteoarthritis . Daughter manages medications and health conditions for her mother . CHF/Afib: Dr. Caryl Comes for EP, [facility] for general cardiology; managed on metoprolol succinate 25 mg daily, losartan 25 mg daily, spironolactone 12.5 mg daily, furosemide 80 mg daily; Rivaroxaban 20 mg daily o Patient had steroid injection in knee for OA on Wednesday; today, daughter reports patient was up 5 lb from yesterday o Daughter attempted to contact Dr. Olin Pia nurse; was told she was off today. Noted that she is often told to give extra furosemide when her mother is retaining fluid, so gave an extra furosemide (80 mg + 40 mg a few hours later); notes patient is already down ~ 2 lbs. Notes patient feeling more tired today   . Dementia/Depression: galantamine 4 mg daily, sertraline 50 mg daily, buspirone PRN anxiety, daughter notes  she is needing to give her mother this less frequently recently . ASCVD risk reduction: rosuvastatin 10 mg daily . Health maintenance: Vitamin D 2000 units dialy  Pharmacist Clinical Goal(s):  [name] Over the next 90 days, patient will work with PharmD and provider towards optimized medication management  Interventions: . Comprehensive medication review performed; medication list updated in electronic medical record . Collaborated with [name], NP for recommendations for fluid weight gain. She recommended continuing monitoring for increased weight and s/sx fluid overload. Continue increased furosemide dose as she is doing. Monitor for need to seek emergency care over the weekend if worsening signs of fluid overload. Avoid sodium. Provided these instructions to patient's daughter; she verbalized understanding.   Patient Self Care Activities:  . Patient will take medications as prescribed  Initial goal documentation        The patient verbalized understanding of instructions provided today and declined a print copy of patient instruction materials.   Plan:  - Will outreach patient in the next 5-6 weeks for continued medication management support  Catie Darnelle Maffucci, PharmD Clinical Pharmacist [place] [phone]

## 2019-05-25 NOTE — Patient Instructions (Signed)
Thank you allowing the Chronic Care Management Team to be a part of your care! It was a pleasure speaking with you today!   CCM (Chronic Care Management) Team   Taira Knabe RN, BSN Nurse Care Coordinator  [phone]  [facility] PharmD  Clinical Pharmacist  [phone]  Eula Fried LCSW Clinical Social Worker [phone]  Goals Addressed            This Visit's Progress   . Physical therepy needs (pt-stated)       Current Barriers:  [name] Knowledge Deficits related to obtaining a [facility] PT   Nurse Case Manager Clinical Goal(s):  [name] Over the next 30  days, patient will work with [facility]  to address needs related to PT referral   Interventions:  . Discussed plans with patient for ongoing care management follow up and provided patient with direct contact information for care management team . Patient with Memory issues, daughter the main caregiver. Daughter does state she is taking a small break this week end and recognizes the importance of caring for herself. - Daughter reports doing well at this time . Patient doing crafts and word searches to assist with memory.  . Patient has completed HH PT through [facility] and daughter reports this was very helpful. Daughter reports patient was able to obtain a steroid injection in her knee that is helpful for about a week. . Discussed with daughter and patient the importance of monitoring patient's weight related to hx of heart failure.   Patient Self Care Activities:  . Currently UNABLE TO independently obtain HH PT- Goal Met  Please see past updates related to this goal by clicking on the "Past Updates" button in the selected goal         The patient verbalized understanding of instructions provided today and declined a print copy of patient instruction materials.   The patient has been provided with contact information for the care management team and has been advised to call with any health related questions or  concerns.

## 2019-06-17 ENCOUNTER — Encounter: Payer: Self-pay | Admitting: Nurse Practitioner

## 2019-06-17 ENCOUNTER — Other Ambulatory Visit: Payer: Self-pay

## 2019-06-17 ENCOUNTER — Ambulatory Visit (INDEPENDENT_AMBULATORY_CARE_PROVIDER_SITE_OTHER): Payer: Medicare Other | Admitting: Nurse Practitioner

## 2019-06-17 VITALS — BP 151/69 | HR 65 | Temp 98.3°F

## 2019-06-17 DIAGNOSIS — B354 Tinea corporis: Secondary | ICD-10-CM

## 2019-06-17 MED ORDER — FLUCONAZOLE 150 MG PO TABS: 150.0000 mg | ORAL_TABLET | Freq: Once | ORAL | 0 refills | Status: AC

## 2019-06-17 MED ORDER — NYSTATIN 100000 UNIT/GM EX CREA: 1.0000 "application " | TOPICAL_CREAM | Freq: Two times a day (BID) | CUTANEOUS | 1 refills | Status: DC

## 2019-06-17 NOTE — Assessment & Plan Note (Signed)
Acute, under bilateral breasts.  Discussed with patient's daughter (caregiver) tactics to use to encourage patient with dementia to assist with bathing: music and reward sweets after care.  Script for Diflucan x 1 sent and Nystatin cream. Recommend to hold Crestor 24-48 hours after taking Diflucan to avoid interaction.  If ongoing issues may need to repeat Diflucan dose and advised patient's daughter to call.  Discussed proper hygiene, including drying well after bathing.  Return to office for worsening or continued issues.

## 2019-06-17 NOTE — Patient Instructions (Signed)

## 2019-06-17 NOTE — Progress Notes (Signed)
BP (!) 151/69   Pulse 65   Temp 98.3 F (36.8 C) (Oral)   LMP  (LMP Unknown)   SpO2 97%    Subjective:    Patient ID: Barbara Blackburn, female    DOB: January 30, 1929, 85 y.o.   MRN: [id number]  HPI: Barbara Blackburn is a 85 y.o. female  Chief Complaint  Patient presents with  . Rash    pt states she has a rash under her breast that burns, states the left is worst than the right. States she first noticed it yesterday    YEAST INFECTION: Her daughter reports first noticing this yesterday, as patient has stopped letting daughter help her with baths.  Due to dementia and stressor with having duaghter help her, she started a few months back not allowing assistance.  Treated at home with Hibiclens, Nystatin powder (did not have much left), Nystatin ointment, and Medline skin/wound cleaner.  Prefer the cream vs the powder. Duration:  days  Location: under breasts Itching: no Burning: yes Redness: yes Oozing: no Scaling: no Blisters: no Painful: yes Fevers: no Change in detergents/soaps/personal care products: no Recent illness: no Recent travel:no History of same: yes Context: fluctuating Alleviating factors: nothing Treatments attempted:nothing Shortness of breath: no  Throat/tongue swelling: no Myalgias/arthralgias: no  Relevant past medical, surgical, family and social history reviewed and updated as indicated. Interim medical history since our last visit reviewed. Allergies and medications reviewed and updated.  Review of Systems  Constitutional: Negative for activity change, appetite change, diaphoresis, fatigue and fever.  Respiratory: Negative for cough, chest tightness and shortness of breath.   Cardiovascular: Negative for chest pain, palpitations and leg swelling.  Gastrointestinal: Negative for abdominal distention, abdominal pain, constipation, diarrhea, nausea and vomiting.  Skin: Positive for rash.  Neurological: Negative for dizziness.    Psychiatric/Behavioral: Negative.     Per HPI unless specifically indicated above     Objective:    BP (!) 151/69   Pulse 65   Temp 98.3 F (36.8 C) (Oral)   LMP  (LMP Unknown)   SpO2 97%   Wt Readings from Last 3 Encounters:  05/21/19 163 lb 8 oz (74.2 kg)  04/15/19 163 lb (73.9 kg)  04/07/19 165 lb (74.8 kg)    Physical Exam Vitals signs and nursing note reviewed.  Constitutional:      General: She is awake. She is not in acute distress.    Appearance: She is well-developed. She is not ill-appearing.  HENT:     Head: Normocephalic.     Right Ear: Hearing normal.     Left Ear: Hearing normal.  Eyes:     General: Lids are normal.        Right eye: No discharge.        Left eye: No discharge.     Conjunctiva/sclera: Conjunctivae normal.     Pupils: Pupils are equal, round, and reactive to light.  Neck:     Musculoskeletal: Normal range of motion and neck supple.     Vascular: No carotid bruit.  Cardiovascular:     Rate and Rhythm: Normal rate and regular rhythm.     Heart sounds: Normal heart sounds. No murmur. No gallop.   Pulmonary:     Effort: Pulmonary effort is normal. No accessory muscle usage or respiratory distress.     Breath sounds: Normal breath sounds.  Abdominal:     General: Bowel sounds are normal.     Palpations: Abdomen is soft.  Musculoskeletal:  Right lower leg: No edema.     Left lower leg: No edema.  Skin:    General: Skin is warm and dry.     Findings: Rash present.     Comments: Moderate erythema present under bilateral breast L>R. Erythema with distinct margins and excoriation present.  Small, papular areas noted to middle aspect.  No vesicles.  Moderate moisture present under bilateral breasts.  No open sores or drainage.  Neurological:     Mental Status: She is alert and oriented to person, place, and time.  Psychiatric:        Attention and Perception: Attention normal.        Mood and Affect: Mood normal.        Behavior:  Behavior normal. Behavior is cooperative.        Thought Content: Thought content normal.        Judgment: Judgment normal.     Results for orders placed or performed in visit on 04/15/19  [facility] [place]  Result Value Ref Range   Date Time Interrogation Session [id number]    Pulse Generator Manufacturer [employer]    Pulse Gen Model [id number] Evera XT DR    Pulse Gen Serial Number [id number]    Cl[facility] Name [facility]    Implantable Pulse Generator Type Implantable Cardiac Defibulator    Implantable Pulse Generator Implant Date IQ:[id number]    Implantable Lead Manufacturer [employer]    Implantable Lead Model 5076 CapSureFix Novus    Implantable Lead Serial Number [id number]    Implantable Lead Implant Date LB:4702610    Implantable Lead Location Detail 1 APPENDAGE    Implantable Lead Location [id number]    Implantable Lead Manufacturer [facility]    Implantable Lead Model [phone] Sprint Fidelis    Implantable Lead Serial Number [id number]    Implantable Lead Implant Date JR:4662745    Implantable Lead Location Detail 1 APEX    Implantable Lead Location [id number]    Implantable Lead Manufacturer [employer]    Implantable Lead Model 5092 CapSure SP Novus    Implantable Lead Serial Number [id number]    Implantable Lead Implant Date SU:8417619    Implantable Lead Location [phone]    Lead Channel Setting Sensing Sensitivity 0.3 mV   Lead Channel Setting Pacing Pulse Width 0.4 ms   Lead Channel Setting Pacing Amplitude 2.5 V   Lead Channel Impedance Value 399 ohm   Lead Channel Sensing Intrinsic Amplitude 0.75 mV   Lead Channel Sensing Intrinsic Amplitude 0.375 mV   Lead Channel Impedance Value 456 ohm   Lead Channel Impedance Value 380 ohm   Lead Channel Sensing Intrinsic Amplitude 1.75 mV   Lead Channel Sensing Intrinsic Amplitude 0.75 mV   Lead Channel Pacing Threshold Amplitude 0.625 V   Lead Channel Pacing Threshold Pulse Width 0.4 ms   HighPow Impedance 44 ohm   HighPow Impedance 59 ohm    Battery Status OK    Battery Remaining Longevity 101 mo   Battery Voltage 3 V   Brady Statistic RA Percent Paced 0 %   Brady Statistic RV Percent Paced 6.36 %   Brady Statistic AP VP Percent 0 %   Brady Statistic AS VP Percent 0 %   Brady Statistic AP VS Percent 0 %   Brady Statistic AS VS Percent 0 %      Assessment & Plan:   Problem List Items Addressed This Visit      Musculoskeletal and Integument   Tinea corporis - Primary  Acute, under bilateral breasts.  Discussed with patient's daughter (caregiver) tactics to use to encourage patient with dementia to assist with bathing: music and reward sweets after care.  Script for Diflucan x 1 sent and Nystatin cream. Recommend to hold Crestor 24-48 hours after taking Diflucan to avoid interaction.  If ongoing issues may need to repeat Diflucan dose and advised patient's daughter to call.  Discussed proper hygiene, including drying well after bathing.  Return to office for worsening or continued issues.      Relevant Medications   fluconazole (DIFLUCAN) 150 MG tablet   nystatin cream (MYCOSTATIN)       Follow up plan: Return if symptoms worsen or fail to improve.

## 2019-06-24 ENCOUNTER — Ambulatory Visit (INDEPENDENT_AMBULATORY_CARE_PROVIDER_SITE_OTHER): Payer: Medicare Other | Admitting: *Deleted

## 2019-06-24 DIAGNOSIS — I420 Dilated cardiomyopathy: Secondary | ICD-10-CM

## 2019-06-24 DIAGNOSIS — I4821 Permanent atrial fibrillation: Secondary | ICD-10-CM

## 2019-06-24 LAB — CUP PACEART REMOTE DEVICE CHECK
Battery Remaining Longevity: 96 mo
Battery Voltage: 3 V
Brady Statistic AP VP Percent: 0 %
Brady Statistic AP VS Percent: 0 %
Brady Statistic AS VP Percent: 0 %
Brady Statistic AS VS Percent: 0 %
Brady Statistic RA Percent Paced: 0 %
Brady Statistic RV Percent Paced: 25.01 %
Date Time Interrogation Session: 20201027062823
HighPow Impedance: 42 Ohm
HighPow Impedance: 54 Ohm
Implantable Lead Implant Date: 20040220
Implantable Lead Implant Date: 20070426
Implantable Lead Implant Date: 20070511
Implantable Lead Location: 753859
Implantable Lead Location: 753860
Implantable Lead Location: 753860
Implantable Lead Model: 5076
Implantable Lead Model: 5092
Implantable Lead Model: 6949
Implantable Pulse Generator Implant Date: 20180411
Lead Channel Impedance Value: 380 Ohm
Lead Channel Impedance Value: 399 Ohm
Lead Channel Impedance Value: 437 Ohm
Lead Channel Pacing Threshold Amplitude: 0.625 V
Lead Channel Pacing Threshold Pulse Width: 0.4 ms
Lead Channel Sensing Intrinsic Amplitude: 0.375 mV
Lead Channel Sensing Intrinsic Amplitude: 0.75 mV
Lead Channel Sensing Intrinsic Amplitude: 1.125 mV
Lead Channel Sensing Intrinsic Amplitude: 1.125 mV
Lead Channel Setting Pacing Amplitude: 2.5 V
Lead Channel Setting Pacing Pulse Width: 0.4 ms
Lead Channel Setting Sensing Sensitivity: 0.3 mV

## 2019-06-25 ENCOUNTER — Ambulatory Visit (INDEPENDENT_AMBULATORY_CARE_PROVIDER_SITE_OTHER): Payer: Medicare Other | Admitting: Licensed Clinical Social Worker

## 2019-06-25 ENCOUNTER — Ambulatory Visit (INDEPENDENT_AMBULATORY_CARE_PROVIDER_SITE_OTHER): Payer: Medicare Other

## 2019-06-25 DIAGNOSIS — F0280 Dementia in other diseases classified elsewhere without behavioral disturbance: Secondary | ICD-10-CM

## 2019-06-25 DIAGNOSIS — G301 Alzheimer's disease with late onset: Secondary | ICD-10-CM | POA: Diagnosis not present

## 2019-06-25 DIAGNOSIS — I5022 Chronic systolic (congestive) heart failure: Secondary | ICD-10-CM

## 2019-06-25 DIAGNOSIS — Z95810 Presence of automatic (implantable) cardiac defibrillator: Secondary | ICD-10-CM | POA: Diagnosis not present

## 2019-06-25 NOTE — Chronic Care Management (AMB) (Signed)
Chronic Care Management    Clinical Social Work Follow Up Note  06/25/2019 Name: Barbara Blackburn MRN: [id number] DOB: 01-Jun-1929  Barbara Blackburn is a 85 y.o. year old female who is a primary care patient of Cannady, [name], NP. The CCM team was consulted for assistance with Caregiver Stress.   Review of patient status, including review of consultants reports, other relevant assessments, and collaboration with appropriate care team members and the patient's provider was performed as part of comprehensive patient evaluation and provision of chronic care management services.    SDOH (Social Determinants of Health) screening performed today: Stress. See Care Plan for related entries.   Advanced Directives Status: <no information> See Care Plan for related entries.   Outpatient Encounter Medications as of 06/25/2019  Medication Sig Note  . busPIRone (BUSPAR) 5 MG tablet Take 5 mg by mouth daily as needed.  04/08/2019: Takes 2.5 mg if during day, 5 mg at night   . Cholecalciferol (VITAMIN D) 2000 units tablet Take 2,000 Units by mouth daily.   . furosemide (LASIX) 40 MG tablet Take 3 tablets (120 mg) by mouth once every other day in the morning 05/21/2019: Patient taking differently. Twice one tablet daily (80 mg total)  . galantamine (RAZADYNE) 4 MG tablet Take 1 tablet (4 mg total) by mouth 2 (two) times daily with a meal.   . losartan (COZAAR) 25 MG tablet Take 1 tablet (25 mg total) by mouth daily.   . metoprolol succinate (TOPROL-XL) 25 MG 24 hr tablet Take 1 tablet (25 mg total) by mouth daily.   . Multiple Vitamin (MULTIVITAMIN) capsule Take 1 capsule by mouth daily.   [name] nystatin cream (MYCOSTATIN) Apply 1 application topically 2 (two) times daily.   . rivaroxaban (XARELTO) 20 MG TABS tablet TAKE 1 TABLET BY MOUTH  DAILY WITH SUPPER   . rosuvastatin (CRESTOR) 10 MG tablet Take 1 tablet (10 mg total) by mouth daily.   . sertraline (ZOLOFT) 50 MG tablet Take 1 tablet (50 mg total) by  mouth daily.   [name] spironolactone (ALDACTONE) 25 MG tablet Take 0.5 tablets (12.5 mg total) by mouth daily.    No facility-administered encounter medications on file as of 06/25/2019.      Goals Addressed    . SW"Mother needs more socialization/activity to help with her dementia/mobility" (pt-stated)       Current Barriers:  . Limited social support . ADL IADL limitations . Social Isolation . Memory Deficits . Inability to perform ADL's independently . Inability to perform IADL's independently  Clinical Social Work Clinical Goal(s):  [name] Over the next 90 days, client will work with SW to address concerns related to gaining additional support/resources   Interventions: . Patient interviewed and appropriate assessments performed . Provided patient with information about available socialization resources within the area. Dementia support resource education provided as well.  . Discussed plans with patient for ongoing care management follow up and provided patient with direct contact information for care management team . Advised patient to contact CCM team for any urgent concerns . Assisted patient/caregiver with obtaining information about health plan benefits . Provided education and assistance to client regarding Advanced Directives. . Provided education to patient/caregiver regarding level of care options. [name] LCSW provided education on Medicaid enrollment and the LTC placement process. Family prefer to keep patient in the home for now but admit to caregiver burnout/stress.  [name] LCSW provided education on available personal care service resources within the area.  . Daughter confirms  recent PCP visit (yeast infection is improving)  Patient Self Care Activities:  . Attends all scheduled provider appointments . Calls provider office for new concerns or questions  Please see past updates related to this goal by clicking on the "Past Updates" button in the selected goal      Follow Up  Plan: SW will follow up with patient by phone over the next quarter  Eula Fried, Remy, MSW, [email or web address] Phone: [phone]

## 2019-06-27 NOTE — Progress Notes (Signed)
EPIC Encounter for ICM Monitoring  Patient Name: Barbara Blackburn is a 85 y.o. female Date: 06/27/2019 Primary Care Physican: Venita Lick, NP Primary Cardiologist:Gollan Electrophysiologist:Klein 05/13/2019 Weight: 163 lbs 06/27/2019 Weight: 169.4 lbs     Spoke with [name].  Patient is doing well but has gained 3 lbs in last week but >5 lbs in the last 2-3 weeks.   At last office visit with Dr Caryl Comes, daughter reported discussing about patient taking        Furosemide daily instead of every other day so since September she has been giving patient Furosemide 2 tablets daily.      OptivolThoracic impedancetrending to baseline normal but has been suggesting possible pattern of fluid accumulation for several days followed by return to baseline for a couple of days.   Prescribed: Furosemide40 mgTake 3 tablets (120 mg) by mouth once every other day in the morning.   Labs: 01/16/2019 Creatinine 0.89, BUN 20, Potassium 4.2, Sodium 141, GFR 58-67 11/07/2018 Creatinine0.78, BUN23, Potassium3.9, [id number], [id number] A complete set of results can be found in Results Review.  Recommendations:  Advised to take prescribed Furosemide 40 mg 3 tablets every other day starting tomorrow.  Advised on Saturday(10/31) and Monday (11/2) take additional 2 tablets in afternoon.  She should return to the prescribed dosage of Furosemide 40 mg take 3 tablets (120 mg) by mouth once every other day in the morning.  Advised to weigh patient every morning consistently.  Will recheck fluid levels on 07/01/2019    Follow-up plan: ICM clinic phone appointment on 07/01/2019 (manual send) to recheck fluid levels.   91 day device clinic remote transmission 09/23/2019.   Copy of ICM check sent to Dr. Caryl Comes.   3 month ICM trend: 06/24/2019    1 Year ICM trend:       [name], RN 06/27/2019 12:16 PM

## 2019-07-01 ENCOUNTER — Ambulatory Visit (INDEPENDENT_AMBULATORY_CARE_PROVIDER_SITE_OTHER): Payer: Medicare Other

## 2019-07-01 DIAGNOSIS — Z95810 Presence of automatic (implantable) cardiac defibrillator: Secondary | ICD-10-CM

## 2019-07-01 DIAGNOSIS — I5022 Chronic systolic (congestive) heart failure: Secondary | ICD-10-CM

## 2019-07-01 NOTE — Progress Notes (Signed)
EPIC Encounter for ICM Monitoring  Patient Name: Barbara Blackburn is a 85 y.o. female Date: 07/01/2019 Primary Care Physican: Venita Lick, NP Primary Cardiologist:Gollan Electrophysiologist:Klein 05/13/2019 Weight: 163 lbs 06/27/2019 Weight: 169.4 lbs  07/01/2019 Weight: 168 lbs    Spoke with [name].  Pt's weight dropped a pound and is not having any fluid symptoms.  OptivolThoracic impedancetrending close to baseline normal but has been suggesting possible pattern of fluid accumulation for several days followed by return to baseline for a couple of days.  Prescribed: Furosemide40 mgTake 3 tablets (120 mg) by mouth once every other day in the morning.   Labs: 01/16/2019 Creatinine 0.89, BUN 20, Potassium 4.2, Sodium 141, GFR 58-67 11/07/2018 Creatinine0.78, BUN23, Potassium3.9, [id number], [id number] A complete set of results can be found in Results Review.  Recommendations:  Encouraged to call if experiencing fluid symptoms.  Follow-up plan: ICM clinic phone appointment on 08/04/2019. 91 day device clinic remote transmission 09/23/2019.   Copy of ICM check sent to Dr. Caryl Comes.   3 month ICM trend: 07/01/2019    1 Year ICM trend:       [name], RN 07/01/2019 3:32 PM

## 2019-07-02 ENCOUNTER — Ambulatory Visit: Payer: Self-pay | Admitting: Pharmacist

## 2019-07-02 ENCOUNTER — Telehealth: Payer: Self-pay

## 2019-07-02 DIAGNOSIS — F0280 Dementia in other diseases classified elsewhere without behavioral disturbance: Secondary | ICD-10-CM

## 2019-07-02 NOTE — Chronic Care Management (AMB) (Signed)
Chronic Care Management   Follow Up Note   07/02/2019 Name: Barbara Blackburn MRN: [id number] DOB: April 13, 1929  Referred by: Venita Lick, NP Reason for referral : Chronic Care Management (Medication Management)   Barbara Blackburn is a 85 y.o. year old female who is a primary care patient of Cannady, Barbara Faster, NP. The CCM team was consulted for assistance with chronic disease management and care coordination needs.    Contacted patient's daughter for medication management support.  Review of patient status, including review of consultants reports, relevant laboratory and other test results, and collaboration with appropriate care team members and the patient's provider was performed as part of comprehensive patient evaluation and provision of chronic care management services.    SDOH (Social Determinants of Health) screening performed today: Financial Strain . See Care Plan for related entries.   Advanced Directives Status: N See Care Plan and Vynca application for related entries.  Outpatient Encounter Medications as of 07/02/2019  Medication Sig Note  . busPIRone (BUSPAR) 5 MG tablet Take 5 mg by mouth daily as needed.  04/08/2019: Takes 2.5 mg if during day, 5 mg at night   . Cholecalciferol (VITAMIN D) 2000 units tablet Take 2,000 Units by mouth daily.   . furosemide (LASIX) 40 MG tablet Take 3 tablets (120 mg) by mouth once every other day in the morning 05/21/2019: Patient taking differently. Twice one tablet daily (80 mg total)  . galantamine (RAZADYNE) 4 MG tablet Take 1 tablet (4 mg total) by mouth 2 (two) times daily with a meal.   . losartan (COZAAR) 25 MG tablet Take 1 tablet (25 mg total) by mouth daily.   . metoprolol succinate (TOPROL-XL) 25 MG 24 hr tablet Take 1 tablet (25 mg total) by mouth daily.   . Multiple Vitamin (MULTIVITAMIN) capsule Take 1 capsule by mouth daily.   [name] nystatin cream (MYCOSTATIN) Apply 1 application topically 2 (two) times daily.   .  rivaroxaban (XARELTO) 20 MG TABS tablet TAKE 1 TABLET BY MOUTH  DAILY WITH SUPPER   . rosuvastatin (CRESTOR) 10 MG tablet Take 1 tablet (10 mg total) by mouth daily.   . sertraline (ZOLOFT) 50 MG tablet Take 1 tablet (50 mg total) by mouth daily.   [name] spironolactone (ALDACTONE) 25 MG tablet Take 0.5 tablets (12.5 mg total) by mouth daily.    No facility-administered encounter medications on file as of 07/02/2019.      Goals Addressed            This Visit's Progress     Patient Stated   . PharmD "We want her to stay healthy" (pt-stated)       Current Barriers:  . Polypharmacy; complex patient with multiple comorbidities including dementia, HF, Afib, ICD placement, osteoarthritis . Daughter manages medications and health conditions for her mother . CHF/Afib: Dr. Caryl Comes for EP, [facility] for general cardiology; managed on metoprolol succinate 25 mg daily, losartan 25 mg daily, spironolactone 12.5 mg daily, furosemide 80 mg daily; Rivaroxaban 20 mg daily . Dementia/Depression: galantamine 4 mg daily, sertraline 50 mg daily, buspirone PRN anxiety, daughter notes she is needing to give her mother this less frequently recently o Notes galantamine is expensive, ~$33/month on patient's insurance. Daughter notes that as they continue to need to bring more help in for her mother, finances get tighter . ASCVD risk reduction: rosuvastatin 10 mg daily . Health maintenance: Vitamin D 2000 units dialy  Pharmacist Clinical Goal(s):  [name] Over the next 90 days, patient  will work with PharmD and provider towards optimized medication management  Interventions: . Discussed with daughter that I will research patient's formulary coverage/insurance plan and determine if Tier Exception is an option.   Patient Self Care Activities:  . Patient will take medications as prescribed  Please see past updates related to this goal by clicking on the "Past Updates" button in the selected goal         Plan:  - Will  collaborate with multidisciplinary team as needed for medication management support  Catie Darnelle Maffucci, PharmD Clinical Pharmacist [place] [phone]

## 2019-07-02 NOTE — Patient Instructions (Signed)
Visit Information  Goals Addressed            This Visit's Progress     Patient Stated   . PharmD "We want her to stay healthy" (pt-stated)       Current Barriers:  . Polypharmacy; complex patient with multiple comorbidities including dementia, HF, Afib, ICD placement, osteoarthritis . Daughter manages medications and health conditions for her mother . CHF/Afib: Dr. Caryl Comes for EP, [facility] for general cardiology; managed on metoprolol succinate 25 mg daily, losartan 25 mg daily, spironolactone 12.5 mg daily, furosemide 80 mg daily; Rivaroxaban 20 mg daily . Dementia/Depression: galantamine 4 mg daily, sertraline 50 mg daily, buspirone PRN anxiety, daughter notes she is needing to give her mother this less frequently recently o Notes galantamine is expensive, ~$33/month on patient's insurance. Daughter notes that as they continue to need to bring more help in for her mother, finances get tighter . ASCVD risk reduction: rosuvastatin 10 mg daily . Health maintenance: Vitamin D 2000 units dialy  Pharmacist Clinical Goal(s):  [name] Over the next 90 days, patient will work with PharmD and provider towards optimized medication management  Interventions: . Discussed with daughter that I will research patient's formulary coverage/insurance plan and determine if Tier Exception is an option.   Patient Self Care Activities:  . Patient will take medications as prescribed  Please see past updates related to this goal by clicking on the "Past Updates" button in the selected goal         The patient verbalized understanding of instructions provided today and declined a print copy of patient instruction materials.   Plan:  - Will collaborate with multidisciplinary team as needed for medication management support  Catie Darnelle Maffucci, PharmD Clinical Pharmacist [place] [phone]

## 2019-07-11 DIAGNOSIS — M1711 Unilateral primary osteoarthritis, right knee: Secondary | ICD-10-CM | POA: Diagnosis not present

## 2019-07-14 ENCOUNTER — Other Ambulatory Visit: Payer: Self-pay | Admitting: Nurse Practitioner

## 2019-07-14 NOTE — Progress Notes (Signed)
Carelink Summary Report / Loop Recorder 

## 2019-07-14 NOTE — Telephone Encounter (Signed)
Requested medication (s) are due for refill today: yes  Requested medication (s) are on the active medication list: yes  Last refill: 06/17/2019  Future visit scheduled: yes  Notes to clinic:  Off protocol    Requested Prescriptions  Pending Prescriptions Disp Refills   nystatin cream (MYCOSTATIN) [Pharmacy Med Name: NYSTATIN 100,000 UNIT/GM CREAM] 30 g 0    Sig: Apply 1 application topically 2 (two) times daily.     Off-Protocol Failed - 07/14/2019  3:26 PM      Failed - Medication not assigned to a protocol, review manually.      Passed - Valid encounter within last 12 months    Recent Outpatient Visits          3 weeks ago Tinea corporis   [place], Frederickson T, NP   1 month ago Late onset Alzheimer's disease without behavioral disturbance ([place])   [place] Cannady, Jolene T, NP   5 months ago Late onset Alzheimer's disease without behavioral disturbance ([place])   [place] Cannady, Jolene T, NP   8 months ago Late onset Alzheimer's disease without behavioral disturbance ([place])   [place] Cannady, Jolene T, NP   10 months ago Late onset Alzheimer's disease without behavioral disturbance ([place])   [place], [name], NP      Future Appointments            In 4 months Cannady, [name], NP [employer]

## 2019-08-04 ENCOUNTER — Ambulatory Visit (INDEPENDENT_AMBULATORY_CARE_PROVIDER_SITE_OTHER): Payer: Medicare Other

## 2019-08-04 DIAGNOSIS — I5022 Chronic systolic (congestive) heart failure: Secondary | ICD-10-CM

## 2019-08-04 DIAGNOSIS — Z95810 Presence of automatic (implantable) cardiac defibrillator: Secondary | ICD-10-CM

## 2019-08-05 NOTE — Progress Notes (Signed)
EPIC Encounter for ICM Monitoring  Patient Name: Barbara Blackburn is a 85 y.o. female Date: 08/05/2019 Primary Care Physican: Barbara Lick, NP Primary Cardiologist:Barbara Blackburn Electrophysiologist:Barbara Blackburn 08/05/2019 Weight: 169.4 lbs    Spoke with daughter Barbara Blackburn.  Patient is doing well and no fluid symptoms.   OptivolThoracic impedancenormal.  Prescribed: Furosemide40 mgTake 3 tablets (120 mg) by mouth once every other day in the morning.   Labs: 01/16/2019 Creatinine 0.89, BUN 20, Potassium 4.2, Sodium 141, GFR 58-67 11/07/2018 Creatinine0.78, BUN23, Potassium3.9, [id number], [id number] A complete set of results can be found in Results Review.  Recommendations:  Encouraged to call if experiencing fluid symptoms.  Follow-up plan: ICM clinic phone appointment on 09/08/2019. 91 day device clinic remote transmission 09/23/2019.   Copy of ICM check sent to Dr. Caryl Blackburn.   3 month ICM trend: 08/04/2019    1 Year ICM trend:       Barbara Billings, RN 08/05/2019 12:34 PM

## 2019-08-13 ENCOUNTER — Other Ambulatory Visit: Payer: Self-pay | Admitting: Nurse Practitioner

## 2019-08-20 DIAGNOSIS — M1711 Unilateral primary osteoarthritis, right knee: Secondary | ICD-10-CM | POA: Diagnosis not present

## 2019-09-08 ENCOUNTER — Ambulatory Visit (INDEPENDENT_AMBULATORY_CARE_PROVIDER_SITE_OTHER): Payer: Medicare Other

## 2019-09-08 DIAGNOSIS — Z95810 Presence of automatic (implantable) cardiac defibrillator: Secondary | ICD-10-CM

## 2019-09-08 DIAGNOSIS — I5022 Chronic systolic (congestive) heart failure: Secondary | ICD-10-CM

## 2019-09-09 ENCOUNTER — Other Ambulatory Visit: Payer: Self-pay | Admitting: Nurse Practitioner

## 2019-09-09 NOTE — Telephone Encounter (Signed)
Routing to provider  

## 2019-09-09 NOTE — Progress Notes (Signed)
EPIC Encounter for ICM Monitoring  Patient Name: Barbara Blackburn is a 86 y.o. female Date: 09/09/2019 Primary Care Physican: Venita Lick, NP Primary Cardiologist:Gollan Electrophysiologist:Klein 09/09/2019 Weight: 169.4 lbs    Spoke with [name]. Patient is doing well and no fluid symptoms.   OptivolThoracic impedancenormal.  Prescribed: Furosemide40 mgTake 3 tablets (120 mg) by mouth once every other day in the morning.   Labs: 01/16/2019 Creatinine 0.89, BUN 20, Potassium 4.2, Sodium 141, GFR 58-67 11/07/2018 Creatinine0.78, BUN23, Potassium3.9, [id number], [id number] A complete set of results can be found in Results Review.  Recommendations:Encouraged to call if experiencing fluid symptoms.  Follow-up plan: ICM clinic phone appointment on2/15/2021. 91 day device clinic remote transmission1/26/2021.   Office appt 10/14/2019 with Dr. Caryl Comes.    Copy of ICM check sent to Dr. Caryl Comes.   3 month ICM trend: 09/08/2019    1 Year ICM trend:       [name], RN 09/09/2019 12:36 PM

## 2019-09-10 ENCOUNTER — Telehealth: Payer: Self-pay

## 2019-09-18 ENCOUNTER — Ambulatory Visit: Payer: Medicare Other | Attending: Internal Medicine

## 2019-09-18 DIAGNOSIS — Z20822 Contact with and (suspected) exposure to covid-19: Secondary | ICD-10-CM | POA: Diagnosis not present

## 2019-09-19 LAB — NOVEL CORONAVIRUS, NAA: SARS-CoV-2, NAA: NOT DETECTED

## 2019-09-23 ENCOUNTER — Ambulatory Visit (INDEPENDENT_AMBULATORY_CARE_PROVIDER_SITE_OTHER): Payer: Medicare Other | Admitting: *Deleted

## 2019-09-23 DIAGNOSIS — I420 Dilated cardiomyopathy: Secondary | ICD-10-CM

## 2019-09-23 LAB — CUP PACEART REMOTE DEVICE CHECK
Battery Remaining Longevity: 94 mo
Battery Voltage: 3 V
Brady Statistic AP VP Percent: 0 %
Brady Statistic AP VS Percent: 0 %
Brady Statistic AS VP Percent: 0 %
Brady Statistic AS VS Percent: 0 %
Brady Statistic RA Percent Paced: 0 %
Brady Statistic RV Percent Paced: 14.1 %
Date Time Interrogation Session: 20210126012302
HighPow Impedance: 41 Ohm
HighPow Impedance: 54 Ohm
Implantable Lead Implant Date: 20040220
Implantable Lead Implant Date: 20070426
Implantable Lead Implant Date: 20070511
Implantable Lead Location: 753859
Implantable Lead Location: 753860
Implantable Lead Location: 753860
Implantable Lead Model: 5076
Implantable Lead Model: 5092
Implantable Lead Model: 6949
Implantable Pulse Generator Implant Date: 20180411
Lead Channel Impedance Value: 342 Ohm
Lead Channel Impedance Value: 399 Ohm
Lead Channel Impedance Value: 437 Ohm
Lead Channel Pacing Threshold Amplitude: 0.625 V
Lead Channel Pacing Threshold Pulse Width: 0.4 ms
Lead Channel Sensing Intrinsic Amplitude: 0.375 mV
Lead Channel Sensing Intrinsic Amplitude: 0.75 mV
Lead Channel Sensing Intrinsic Amplitude: 1 mV
Lead Channel Sensing Intrinsic Amplitude: 1 mV
Lead Channel Setting Pacing Amplitude: 2.5 V
Lead Channel Setting Pacing Pulse Width: 0.4 ms
Lead Channel Setting Sensing Sensitivity: 0.3 mV

## 2019-09-26 ENCOUNTER — Telehealth: Payer: Self-pay

## 2019-10-03 ENCOUNTER — Ambulatory Visit: Payer: Medicare Other | Admitting: Nurse Practitioner

## 2019-10-03 ENCOUNTER — Telehealth (INDEPENDENT_AMBULATORY_CARE_PROVIDER_SITE_OTHER): Payer: Medicare Other | Admitting: Nurse Practitioner

## 2019-10-03 ENCOUNTER — Encounter: Payer: Self-pay | Admitting: Nurse Practitioner

## 2019-10-03 VITALS — BP 144/63 | HR 71

## 2019-10-03 DIAGNOSIS — F0280 Dementia in other diseases classified elsewhere without behavioral disturbance: Secondary | ICD-10-CM

## 2019-10-03 DIAGNOSIS — F320 Major depressive disorder, single episode, mild: Secondary | ICD-10-CM

## 2019-10-03 DIAGNOSIS — I4821 Permanent atrial fibrillation: Secondary | ICD-10-CM

## 2019-10-03 DIAGNOSIS — I5022 Chronic systolic (congestive) heart failure: Secondary | ICD-10-CM

## 2019-10-03 DIAGNOSIS — G301 Alzheimer's disease with late onset: Secondary | ICD-10-CM

## 2019-10-03 DIAGNOSIS — I110 Hypertensive heart disease with heart failure: Secondary | ICD-10-CM | POA: Diagnosis not present

## 2019-10-03 DIAGNOSIS — E7800 Pure hypercholesterolemia, unspecified: Secondary | ICD-10-CM

## 2019-10-03 DIAGNOSIS — I10 Essential (primary) hypertension: Secondary | ICD-10-CM

## 2019-10-03 MED ORDER — SERTRALINE HCL 100 MG PO TABS: 100.0000 mg | ORAL_TABLET | Freq: Every day | ORAL | 3 refills | Status: AC

## 2019-10-03 NOTE — Assessment & Plan Note (Signed)
Chronic, ongoing.  Will increase Sertraline to 100 MG and recommend continuation of Buspar as needed.  Having increased behaviors and sun downing events.  Will work with CCM team and see if can assist with daughter caregiver burnout, offer support.  Return in 4 weeks.

## 2019-10-03 NOTE — Assessment & Plan Note (Signed)
Chronic, ongoing.  Continue current medication regimen.  Obtain lipid panel next visit.  May consider discontinuation of this medication with progression of dementia and if med minimization needed.

## 2019-10-03 NOTE — Assessment & Plan Note (Signed)
Chronic, ongoing.  Continue current medication regimen and collaboration with cardiology. 

## 2019-10-03 NOTE — Assessment & Plan Note (Signed)
Continue current med regimen and collaboration with cardiology.  Recommend: - Reminded to call for an overnight weight gain of >2 pounds or a weekly weight weight of >5 pounds - not adding salt to his food and has been reading food labels. Reviewed the importance of keeping daily sodium intake to 2000mg  daily

## 2019-10-03 NOTE — Patient Instructions (Signed)
Dementia Caregiver Guide Dementia is a term used to describe a number of symptoms that affect memory and thinking. The most common symptoms include:  Memory loss.  Trouble with language and communication.  Trouble concentrating.  Poor judgment.  Problems with reasoning.  Child-like behavior and language.  Extreme anxiety.  Angry outbursts.  Wandering from home or public places. Dementia usually gets worse slowly over time. In the early stages, people with dementia can stay independent and safe with some help. In later stages, they need help with daily tasks such as dressing, grooming, and using the bathroom. How to help the person with dementia cope Dementia can be frightening and confusing. Here are some tips to help the person with dementia cope with changes caused by the disease. General tips  Keep the person on track with his or her routine.  Try to identify areas where the person may need help.  Be supportive, patient, calm, and encouraging.  Gently remind the person that adjusting to changes takes time.  Help with the tasks that the person has asked for help with.  Keep the person involved in daily tasks and decisions as much as possible.  Encourage conversation, but try not to get frustrated or harried if the person struggles to find words or does not seem to appreciate your help. Communication tips  When the person is talking or seems frustrated, make eye contact and hold the person's hand.  Ask specific questions that need yes or no answers.  Use simple words, short sentences, and a calm voice. Only give one direction at a time.  When offering choices, limit them to just 1 or 2.  Avoid correcting the person in a negative way.  If the person is struggling to find the right words, gently try to help him or her. How to recognize symptoms of stress Symptoms of stress in caregivers include:  Feeling frustrated or angry with the person with  dementia.  Denying that the person has dementia or that his or her symptoms will not improve.  Feeling hopeless and unappreciated.  Difficulty sleeping.  Difficulty concentrating.  Feeling anxious, irritable, or depressed.  Developing stress-related health problems.  Feeling like you have too little time for your own life. Follow these instructions at home:   Make sure that you and the person you are caring for: ? Get regular sleep. ? Exercise regularly. ? Eat regular, nutritious meals. ? Drink enough fluid to keep your urine clear or pale yellow. ? Take over-the-counter and prescription medicines only as told by your health care providers. ? Attend all scheduled health care appointments.  Join a support group with others who are caregivers.  Ask about respite care resources so that you can have a regular break from the stress of caregiving.  Look for signs of stress in yourself and in the person you are caring for. If you notice signs of stress, take steps to manage it.  Consider any safety risks and take steps to avoid them.  Organize medications in a pill box for each day of the week.  Create a plan to handle any legal or financial matters. Get legal or financial advice if needed.  Keep a calendar in a central location to remind the person of appointments or other activities. Tips for reducing the risk of injury  Keep floors clear of clutter. Remove rugs, magazine racks, and floor lamps.  Keep hallways well lit, especially at night.  Put a handrail and nonslip mat in the bathtub or   shower.  Put childproof locks on cabinets that contain dangerous items, such as medicines, alcohol, guns, toxic cleaning items, sharp tools or utensils, matches, and lighters.  Put the locks in places where the person cannot see or reach them easily. This will help ensure that the person does not wander out of the house and get lost.  Be prepared for emergencies. Keep a list of  emergency phone numbers and addresses in a convenient area.  Remove car keys and lock garage doors so that the person does not try to get in the car and drive.  Have the person wear a bracelet that tracks locations and identifies the person as having memory problems. This should be worn at all times for safety. Where to find support: Many individuals and organizations offer support. These include:  Support groups for people with dementia and for caregivers.  Counselors or therapists.  Home health care services.  Adult day care centers. Where to find more information Alzheimer's Association: www.alz.org Contact a health care provider if:  The person's health is rapidly getting worse.  You are no longer able to care for the person.  Caring for the person is affecting your physical and emotional health.  The person threatens himself or herself, you, or anyone else. Summary  Dementia is a term used to describe a number of symptoms that affect memory and thinking.  Dementia usually gets worse slowly over time.  Take steps to reduce the person's risk of injury, and to plan for future care.  Caregivers need support, relief from caregiving, and time for their own lives. This information is not intended to replace advice given to you by your health care provider. Make sure you discuss any questions you have with your health care provider. Document Revised: 07/27/2017 Document Reviewed: 07/18/2016 Elsevier Patient Education  2020 Elsevier Inc.  

## 2019-10-03 NOTE — Assessment & Plan Note (Signed)
Chronic, ongoing with BP at goal for age.  Continue current medication regimen and collaboration with cardiology.  Avoid hypotension d/t cognitive status and risk for falls.

## 2019-10-03 NOTE — Progress Notes (Signed)
BP (!) 144/63   Pulse 71   LMP  (LMP Unknown)   SpO2 95%    Subjective:    Patient ID: Barbara Blackburn, female    DOB: 1928-12-27, 86 y.o.   MRN: [id number]  HPI: Barbara Blackburn is a 86 y.o. female  Chief Complaint  Patient presents with  . Memory Loss  . Depression  . Hypertension    . This visit was completed via MyChart due to the restrictions of the COVID-19 pandemic. All issues as above were discussed and addressed. Physical exam was done as above through visual confirmation on MyChart. If it was felt that the patient should be evaluated in the office, they were directed there. The patient verbally consented to this visit. . Location of the patient: home . Location of the provider: work . Those involved with this call:  . Provider: [name], DNP . CMA: Yvonna Alanis, CMA . Front Desk/Registration: Don Perking  . Time spent on call: 15 minutes with patient face to face via video conference. More than 50% of this time was spent in counseling and coordination of care. 10 minutes total spent in review of patient's record and preparation of their chart.  . I verified patient identity using two factors (patient name and date of birth). Patient consents verbally to being seen via telemedicine visit today.    HYPERTENSION / HYPERLIPIDEMIA WITH ATRIAL FIBRILLATION AND HF Continues on Crestor, Losartan, Lasix, and Imdur.Last saw cardiology 04/15/2019 with ICM check last 09/08/2019. They restarted her BB, Metoprolol succinate 25 MG, decreased her Losartan to 25 MG Aldactone 12.5 MG.  Taking Lasix 40 MG BID, which her daughter reports helps her mobility.  Continues on Crestor.   Satisfied with current treatment?yes Duration of hypertension:chronic BP monitoring frequency:daily BP range:120-130/60-70 on average BP medication side effects:no Duration of hyperlipidemia:chronic Cholesterol medication side effects:no Cholesterol supplements: none Medication  compliance:good compliance Aspirin:no Recent stressors:no Recurrent headaches:no Visual changes:no Palpitations:no Dyspnea:no Chest pain:no Lower extremity edema:no Dizzy/lightheaded:no  DEMENTIA: Chronic, progressive in nature. Saw neurology 02/17/19, they started her Namenda along with her Galantamine.  The Namenda caused behaviors issues so they stopped. They also prescribed Buspar as needed for anxiety and her daughter has been giving her 1/2 pill PRN as whole 5 MG is too strong.  Patient and daughter deny N&V, CP, dizziness, syncope, headache, fatigue, or decreased appetite at this time.   Patient continues to eat well at home per her daughter report, no recent falls.Has history of fall last year with injury, no recent falls.She lives with daughter in apartment attached to house. Has had some weight loss over past year, but daughter weighs daily and appears to be stabilizing at this time, daughter has been providing her Ensure as instructed.  She is beginning to go to bed at 4 PM and then waking up at midnight and 2:30 AM, getting up and out of bed.  Her daughter fears she will leave house.  They tried Melatonin, but had adverse effect with it and was not beneficial.  Her daughter is unsure she is taking her pills, as sometimes finding them on the floor and different areas.  Her daughter reports major caregiver strain with patient increased behaviors and sun downing.  She has helpers come in at times, but this is costly and they are running out of funding for this.  Is interested in hospice services.  DEPRESSION Having increased behaviors and sun downing.  Mood status: uncontrolled Satisfied with current treatment?: yes Symptom severity:  moderate  Duration of current treatment : chronic Side effects: no Medication compliance: good compliance Psychotherapy/counseling: none Depressed mood: yes Anxious mood: yes Anhedonia: no Significant weight loss or gain: no  Insomnia: yes hard to stay asleep Fatigue: no Feelings of worthlessness or guilt: no Impaired concentration/indecisiveness: no Suicidal ideations: no Hopelessness: no Crying spells: no Depression screen [facility] 2/9 04/07/2019 03/20/2018 02/26/2018 10/05/2017 09/24/2017  Decreased Interest 0 1 2 1 1   Down, Depressed, Hopeless 1 1 2 1 1   PHQ - 2 Score 1 2 4 2 2   Altered sleeping - 0 0 1 1  Tired, decreased energy - 2 2 1 1   Change in appetite - 2 0 0 0  Feeling bad or failure about yourself  - 0 1 1 1   Trouble concentrating - 1 0 0 0  Moving slowly or fidgety/restless - 0 0 0 0  Suicidal thoughts - 0 0 0 0  PHQ-9 Score - 7 7 5 5   Difficult doing work/chores - Somewhat difficult - Somewhat difficult -  Some recent data might be hidden    Relevant past medical, surgical, family and social history reviewed and updated as indicated. Interim medical history since our last visit reviewed. Allergies and medications reviewed and updated.  Review of Systems  Constitutional: Negative for activity change, appetite change, diaphoresis, fatigue and fever.  Respiratory: Negative for cough, chest tightness and shortness of breath.   Cardiovascular: Negative for chest pain, palpitations and leg swelling.  Gastrointestinal: Negative.   Neurological: Negative.   Psychiatric/Behavioral: Positive for behavioral problems and sleep disturbance. Negative for self-injury and suicidal ideas. The patient is nervous/anxious.     Per HPI unless specifically indicated above     Objective:    BP (!) 144/63   Pulse 71   LMP  (LMP Unknown)   SpO2 95%   Wt Readings from Last 3 Encounters:  05/21/19 163 lb 8 oz (74.2 kg)  04/15/19 163 lb (73.9 kg)  04/07/19 165 lb (74.8 kg)    Physical Exam Vitals and nursing note reviewed.  Constitutional:      General: She is awake. She is not in acute distress.    Appearance: She is well-developed. She is not ill-appearing.  HENT:     Head: Normocephalic.     Right Ear:  Hearing normal.     Left Ear: Hearing normal.  Eyes:     General: Lids are normal.        Right eye: No discharge.        Left eye: No discharge.     Conjunctiva/sclera: Conjunctivae normal.  Pulmonary:     Effort: Pulmonary effort is normal. No accessory muscle usage or respiratory distress.  Musculoskeletal:     Cervical back: Normal range of motion.  Neurological:     Mental Status: She is alert.     Comments: Pleasantly confused  Psychiatric:        Attention and Perception: Attention normal.        Mood and Affect: Mood normal.        Behavior: Behavior is agitated. Behavior is cooperative.    Results for orders placed or performed in visit on 09/23/19  CUP PACEART REMOTE DEVICE CHECK  Result Value Ref Range   Date Time Interrogation Session [phone]    Pulse Generator Manufacturer MERM    Pulse Gen Model [id number] Evera XT DR    Pulse Gen Serial Number [id number]    Clinic Name [facility]    Implantable Pulse  Generator Type Implantable Cardiac Defibulator    Implantable Pulse Generator Implant Date HC:[id number]    Implantable Lead Manufacturer MERM    Implantable Lead Model 5076 CapSureFix Novus    Implantable Lead Serial Number [id number]    Implantable Lead Implant Date KF:8777484    Implantable Lead Location Detail 1 APPENDAGE    Implantable Lead Location [id number]    Implantable Lead Manufacturer MERM    Implantable Lead Model [id number] Sprint Fidelis    Implantable Lead Serial Number [id number]    Implantable Lead Implant Date AK:3695378    Implantable Lead Location Detail 1 APEX    Implantable Lead Location [id number]    Implantable Lead Manufacturer MERM    Implantable Lead Model [id number] CapSure SP Novus    Implantable Lead Serial Number [id number]    Implantable Lead Implant Date VO:6580032    Implantable Lead Location [id number]    Lead Channel Setting Sensing Sensitivity 0.3 mV   Lead Channel Setting Pacing Pulse Width 0.4 ms   Lead Channel Setting Pacing Amplitude 2.5 V    Lead Channel Impedance Value 399 ohm   Lead Channel Sensing Intrinsic Amplitude 0.75 mV   Lead Channel Sensing Intrinsic Amplitude 0.375 mV   Lead Channel Impedance Value 437 ohm   Lead Channel Impedance Value 342 ohm   Lead Channel Sensing Intrinsic Amplitude 1 mV   Lead Channel Sensing Intrinsic Amplitude 1 mV   Lead Channel Pacing Threshold Amplitude 0.625 V   Lead Channel Pacing Threshold Pulse Width 0.4 ms   HighPow Impedance 41 ohm   HighPow Impedance 54 ohm   Battery Status OK    Battery Remaining Longevity 94 mo   Battery Voltage 3 V   Brady Statistic RA Percent Paced 0 %   Brady Statistic RV Percent Paced 14.1 %   Brady Statistic AP VP Percent 0 %   Brady Statistic AS VP Percent 0 %   Brady Statistic AP VS Percent 0 %   Brady Statistic AS VS Percent 0 %      Assessment & Plan:   Problem List Items Addressed This Visit      Cardiovascular and Mediastinum   ATRIAL FIBRILLATION    Chronic, ongoing.  Continue current medication regimen and collaboration with cardiology.        Chronic systolic heart failure ([place])    Continue current med regimen and collaboration with cardiology.  Recommend: - Reminded to call for an overnight weight gain of >2 pounds or a weekly weight weight of >5 pounds - not adding salt to his food and has been reading food labels. Reviewed the importance of keeping daily sodium intake to 2000mg  daily       Essential hypertension, benign    Chronic, ongoing with BP at goal for age.  Continue current medication regimen and collaboration with cardiology.  Avoid hypotension d/t cognitive status and risk for falls.          Nervous and Auditory   Dementia ([place]) - Primary    Chronic, progressive.  Continue current medication regimen and collaboration with neurology.  Will increase Sertraline to 100 MG and continue Buspar as needed for behaviors, may consider addition of Trazodone for sleep in future, however wish to keep medication to minimal to  prevent falls.  Hospice referral placed.  Collaborate with CCM team for caregiver support and to assess if ability to get alert system for dementia patients via local police station.  Return in 4 weeks.      Relevant Medications  sertraline (ZOLOFT) 100 MG tablet   Other Relevant Orders   Ambulatory referral to Hospice     Other   Hypercholesterolemia    Chronic, ongoing.  Continue current medication regimen.  Obtain lipid panel next visit.  May consider discontinuation of this medication with progression of dementia and if med minimization needed.      Depression, major, single episode, mild (HCC)    Chronic, ongoing.  Will increase Sertraline to 100 MG and recommend continuation of Buspar as needed.  Having increased behaviors and sun downing events.  Will work with CCM team and see if can assist with daughter caregiver burnout, offer support.  Return in 4 weeks.      Relevant Medications   sertraline (ZOLOFT) 100 MG tablet      I discussed the assessment and treatment plan with the patient. The patient was provided an opportunity to ask questions and all were answered. The patient agreed with the plan and demonstrated an understanding of the instructions.   The patient was advised to call back or seek an in-person evaluation if the symptoms worsen or if the condition fails to improve as anticipated.   I provided 15 minutes of time during this encounter.  Follow up plan: Return in about 4 weeks (around 10/31/2019) for Dementia and Mood.

## 2019-10-03 NOTE — Assessment & Plan Note (Signed)
Chronic, progressive.  Continue current medication regimen and collaboration with neurology.  Will increase Sertraline to 100 MG and continue Buspar as needed for behaviors, may consider addition of Trazodone for sleep in future, however wish to keep medication to minimal to prevent falls.  Hospice referral placed.  Collaborate with CCM team for caregiver support and to assess if ability to get alert system for dementia patients via local police station.  Return in 4 weeks.

## 2019-10-07 ENCOUNTER — Ambulatory Visit: Payer: Self-pay | Admitting: Licensed Clinical Social Worker

## 2019-10-07 ENCOUNTER — Other Ambulatory Visit: Payer: Self-pay | Admitting: Nurse Practitioner

## 2019-10-07 NOTE — Telephone Encounter (Signed)
Requested Prescriptions  Pending Prescriptions Disp Refills  . rosuvastatin (CRESTOR) 10 MG tablet [Pharmacy Med Name: ROSUVASTATIN CALCIUM 10 MG TAB] 60 tablet 0    Sig: Take 1 tablet (10 mg total) by mouth daily.     Cardiovascular:  Antilipid - Statins Failed - 10/07/2019  2:14 PM      Failed - Total Cholesterol in normal range and within 360 days    Cholesterol, Total  Date Value Ref Range Status  09/12/2016 149 100 - 199 mg/dL Final   Cholesterol Piccolo, Waived  Date Value Ref Range Status  09/01/2015 145 <200 mg/dL Final    Comment:                            Desirable                <200                         Borderline High      200- 239                         High                     >239          Failed - LDL in normal range and within 360 days    LDL Calculated  Date Value Ref Range Status  09/12/2016 37 0 - 99 mg/dL Final         Failed - HDL in normal range and within 360 days    HDL  Date Value Ref Range Status  09/12/2016 57 >39 mg/dL Final         Failed - Triglycerides in normal range and within 360 days    Triglycerides  Date Value Ref Range Status  09/12/2016 276 (H) 0 - 149 mg/dL Final   Triglycerides Piccolo,Waived  Date Value Ref Range Status  09/01/2015 182 (H) <150 mg/dL Final    Comment:                            Normal                   <150                         Borderline High     150 - 199                         High                200 - 499                         Very High                >499          Passed - Patient is not pregnant      Passed - Valid encounter within last 12 months    Recent Outpatient Visits          4 days ago Late onset Alzheimer's disease without behavioral disturbance ([place])   [place], [place] T, NP   3 months ago Tinea corporis   [facility] [place], Arial T, NP   4 months ago Late onset Alzheimer's disease without behavioral disturbance ([place])   [place] Cannady, Jolene T, NP   7 months ago Late onset Alzheimer's disease without behavioral disturbance ([place])   [place] Cannady, Jolene T, NP   11 months ago Late onset Alzheimer's disease without behavioral disturbance ([place])   [place], [name], NP      Future Appointments            In 3 weeks Cannady, [name], NP [employer]

## 2019-10-07 NOTE — Chronic Care Management (AMB) (Signed)
  Care Management   Follow Up Note   10/07/2019 Name: Barbara Blackburn MRN: [id number] DOB: 05/28/1929  Referred by: Venita Lick, NP Reason for referral : Care Coordination   Barbara Blackburn is a 86 y.o. year old female who is a primary care patient of Cannady, [name], NP. The care management team was consulted for assistance with care management and care coordination needs.    Review of patient status, including review of consultants reports, relevant laboratory and other test results, and collaboration with appropriate care team members and the patient's provider was performed as part of comprehensive patient evaluation and provision of chronic care management services.    LCSW collaborated with C3 guide and gained resource for Med Alert regarding PCP referral. LCSW sent secure email to patient's daughter on 10/06/2018 with resource information. LCSW will follow up on this education on 11/07/18.  Eula Fried, BSW, MSW, [facility] Care Management [email or web address] Phone: [phone]

## 2019-10-09 ENCOUNTER — Telehealth: Payer: Self-pay | Admitting: Nurse Practitioner

## 2019-10-09 NOTE — Telephone Encounter (Signed)
Provided verbal orders to hospice team and agreed to be attending.

## 2019-10-09 NOTE — Telephone Encounter (Signed)
Copied from [place] [id number]. Topic: General - Inquiry >> Oct 09, 2019 11:03 AM Mathis Bud wrote: Reason for CRM: Marita [place] from [facility] called requesting PCP to be attending on hospices. Also need a verbal for illness Call back [phone]

## 2019-10-10 ENCOUNTER — Telehealth: Payer: Self-pay | Admitting: Nurse Practitioner

## 2019-10-10 ENCOUNTER — Telehealth: Payer: Self-pay | Admitting: Primary Care

## 2019-10-10 NOTE — Telephone Encounter (Signed)
Verbal order given to [facility] at hospice office.

## 2019-10-10 NOTE — Telephone Encounter (Signed)
Spoke with patient's daughter [name] regarding Palliative services and all questions were answered and she was in agreement with this.  I have scheduled a Zoom Consult for 10/15/19 @ 11 AM.

## 2019-10-10 NOTE — Telephone Encounter (Signed)
Lattie Haw, with Hospice, calling to state that patient did not meet criteria for hospice services. However, she would like verbal orders for palliative care to speak with patient about their services.

## 2019-10-13 ENCOUNTER — Ambulatory Visit (INDEPENDENT_AMBULATORY_CARE_PROVIDER_SITE_OTHER): Payer: Medicare Other

## 2019-10-13 DIAGNOSIS — I5022 Chronic systolic (congestive) heart failure: Secondary | ICD-10-CM

## 2019-10-13 DIAGNOSIS — Z95810 Presence of automatic (implantable) cardiac defibrillator: Secondary | ICD-10-CM | POA: Diagnosis not present

## 2019-10-14 ENCOUNTER — Encounter: Payer: Self-pay | Admitting: Internal Medicine

## 2019-10-14 ENCOUNTER — Ambulatory Visit (INDEPENDENT_AMBULATORY_CARE_PROVIDER_SITE_OTHER): Payer: Medicare Other | Admitting: Internal Medicine

## 2019-10-14 ENCOUNTER — Other Ambulatory Visit: Payer: Self-pay

## 2019-10-14 VITALS — BP 122/70 | HR 65 | Ht 61.0 in | Wt 177.4 lb

## 2019-10-14 DIAGNOSIS — I4819 Other persistent atrial fibrillation: Secondary | ICD-10-CM | POA: Diagnosis not present

## 2019-10-14 DIAGNOSIS — I5022 Chronic systolic (congestive) heart failure: Secondary | ICD-10-CM

## 2019-10-14 DIAGNOSIS — I428 Other cardiomyopathies: Secondary | ICD-10-CM

## 2019-10-14 DIAGNOSIS — I472 Ventricular tachycardia, unspecified: Secondary | ICD-10-CM

## 2019-10-14 DIAGNOSIS — Z95810 Presence of automatic (implantable) cardiac defibrillator: Secondary | ICD-10-CM

## 2019-10-14 DIAGNOSIS — Z79899 Other long term (current) drug therapy: Secondary | ICD-10-CM

## 2019-10-14 NOTE — Patient Instructions (Signed)
Medication Instructions:  - Your physician recommends that you continue on your current medications as directed. Please refer to the Current Medication list given to you today.  *If you need a refill on your cardiac medications before your next appointment, please call your pharmacy*  Lab Work: - Your physician recommends that you have lab work today: BMP CBC  If you have labs (blood work) drawn today and your tests are completely normal, you will receive your results only by: [name] MyChart Message (if you have MyChart) OR . A paper copy in the mail If you have any lab test that is abnormal or we need to change your treatment, we will call you to review the results.  Testing/Procedures: - none ordered  Follow-Up: At [facility], you and your health needs are our priority.  As part of our continuing mission to provide you with exceptional heart care, we have created designated Provider Care Teams.  These Care Teams include your primary Cardiologist (physician) and Advanced Practice Providers (APPs -  Physician Assistants and Nurse Practitioners) who all work together to provide you with the care you need, when you need it.  Your next appointment:   6 month(s)  The format for your next appointment:   In Person  Provider:   Virl Axe, MD  Other Instructions n/a

## 2019-10-14 NOTE — Progress Notes (Signed)
.      Patient Care Team: Venita Lick, NP as PCP - General (Nurse Practitioner) Winfield Rast, DC as Referring Physician (Chiropractic Medicine) Deboraha Sprang, MD as Consulting Physician (Cardiology) Minna Merritts, MD as Consulting Physician (Cardiology) Hooten, Laurice Record, MD (Orthopedic Surgery) Minor, Dalbert Garnet, RN (Inactive) as Case Manager De [place], [facility] as Pharmacist (Pharmacist)   HPI  Barbara Blackburn is a 86 y.o. female Seen in followup for a dual chamber ICD implanted for syncope in the setting of  Nonischemic and  ischemic cardiomyopathy with prior STENTING  and congestive heart failure. She had  received a defibrillator with a 6949-lead.  At  device generator replacement in 2012; she underwent "pirating" of her rate sense pacing lead and capping of the rate sense portion of her ICD lead  She reached ERI and underwent generator replacement 4/18.  Atrial fibrillation.  Permanent.  Prior stroke.  Anticoagulation with rivaroxaban. No bleeding  Seen in 2/20  following an ICD discharge  ATP failed ICD discharge terminated V T/VF as well as transiently atrial fibrillation in setting of his congestive heart failure     DATE TEST    2012        EF 20.25 %   2/14    Myoview   EF 50 %   4/17 Echo EF 60-65%   5/20 Echo  20-25% LAE (4.9/-/63)       Date Cr K GFR Hgb  7/18 0.81  60s 15.2  5/19  0.72 3.6  15.1  1/20 0.81 4.0  14.1  5/20 0.89 4.2  14.4    She has been followed closely in the [facility] clinic.  Optivols are at baseline  Weak and memory deficits--dyspnea at baseline  No chest pain   Thromboembolic risk factors ( age  -2, HTN-1, TIA/CVA-2, Vasc disease -1, Gender-1) for a CHADSVASc Score of 7    Past Medical History:  Diagnosis Date  . 6949-lead 11/11/2013  . Allergy   . CHF (congestive heart failure) (HCC)    class 2  . Chronic atrial fibrillation ([place])   . Coronary artery disease   . Degenerative joint disease    severe  . Dyslipidemia     . Hyperlipidemia   . ICD-Medtronic 05/10/2009   Qualifier: Diagnosis of  By: Lovena Le, MD, [facility], Binnie Kand   . Nonischemic cardiomyopathy [facility])     Past Surgical History:  Procedure Laterality Date  . ICD GENERATOR CHANGEOUT N/A 12/06/2016   Procedure: ICD Generator Changeout;  Surgeon: Deboraha Sprang, MD;  Location: [place] CV LAB;  Service: Cardiovascular;  Laterality: N/A;  . Medtronic dual-chamber ICD  01/05/2006   generator change April 2012  . PCI stent    . REPLACEMENT TOTAL KNEE      Current Outpatient Medications  Medication Sig Dispense Refill  . busPIRone (BUSPAR) 5 MG tablet Take 5 mg by mouth daily as needed.     . Cholecalciferol (VITAMIN D) 2000 units tablet Take 2,000 Units by mouth daily.    . furosemide (LASIX) 40 MG tablet Take 3 tablets (120 mg) by mouth once every other day in the morning    . galantamine (RAZADYNE) 4 MG tablet Take 1 tablet (4 mg total) by mouth 2 (two) times daily with a meal. 180 tablet 0  . metoprolol succinate (TOPROL-XL) 25 MG 24 hr tablet Take 1 tablet (25 mg total) by mouth daily. 90 tablet 3  . nystatin cream (MYCOSTATIN) Apply 1  application topically 2 (two) times daily. 30 g 2  . omeprazole (PRILOSEC) 20 MG capsule Take 20 mg by mouth daily as needed.    . rosuvastatin (CRESTOR) 10 MG tablet Take 1 tablet (10 mg total) by mouth daily. 60 tablet 0  . sertraline (ZOLOFT) 100 MG tablet Take 1 tablet (100 mg total) by mouth daily. 90 tablet 3  . XARELTO 20 MG TABS tablet TAKE 1 TABLET BY MOUTH DAILY WITH SUPPER 90 tablet 1  . losartan (COZAAR) 25 MG tablet Take 1 tablet (25 mg total) by mouth daily. 90 tablet 3  . spironolactone (ALDACTONE) 25 MG tablet Take 0.5 tablets (12.5 mg total) by mouth daily.     No current facility-administered medications for this visit.    Allergies  Allergen Reactions  . Clonazepam     Nightmare   . Fluoxetine Hcl Other (See Comments)    nightmares  . Lisinopril     Cough   . Zolpidem Tartrate  Other (See Comments)    Nightmares     Review of Systems negative except from HPI and PMH BP 122/70 (BP Location: Right Arm, Patient Position: Sitting, Cuff Size: Normal)   Pulse 65   Ht 5\' 1"  (1.549 m)   Wt 177 lb 6 oz (80.5 kg)   LMP  (LMP Unknown)   SpO2 96%   BMI 33.51 kg/m   Well developed and nourished in no acute distress HENT normal Neck supple with JVP-flat Clear Device pocket well healed; without hematoma or erythema.  There is no tethering subcutaneous atrophy Irregularly irregular rate and rhythm with controlled ventricular response, no murmurs or gallops Abd-soft with active BS  No Clubbing cyanosis edema Skin-warm and dry A & Oriented  Grossly normal sensory and motor function  ECG atrial fibrillation at 70 with intermittent ventricular pacing Intervals-/13/45     Assessment and  Plan  Nonischemic cardiomyopathy  Atrial fibrillation permanent  Congestive heart failure-chronic  systolic  Syncope  VT/VF - treated with interval VT NS but no therapies  Hypertension   Falls  ICD for secondary prevention  The patient's device was interrogated.  The information was reviewed. No changes were made in the programming.     6949-lead with capping of the rate sense portion    Chronic low R waves  End of life discussion    No intercurrent Ventricular tachycardia  On Anticoagulation;  No bleeding issues   No syncope  Recurrent falls, related to forgetting her walker in the bathroom  R waves chronically low  She has a DNR in place.  We have reviewed the role of an ICD in the context of her progressive dementia and her current DNR.  I have stressed that in activation of the ICD will have no immediate impact on her life. We will continue to discuss it with the family and let us know.    Barbara Blackburn 11:01 AM

## 2019-10-15 ENCOUNTER — Other Ambulatory Visit: Payer: Self-pay | Admitting: Primary Care

## 2019-10-15 LAB — CBC WITH DIFFERENTIAL/PLATELET
Basophils Absolute: 0 10*3/uL (ref 0.0–0.2)
Basos: 1 %
EOS (ABSOLUTE): 0 10*3/uL (ref 0.0–0.4)
Eos: 1 %
Hematocrit: 33.5 % — ABNORMAL LOW (ref 34.0–46.6)
Hemoglobin: 10.4 g/dL — ABNORMAL LOW (ref 11.1–15.9)
Immature Grans (Abs): 0 10*3/uL (ref 0.0–0.1)
Immature Granulocytes: 1 %
Lymphocytes Absolute: 1.1 10*3/uL (ref 0.7–3.1)
Lymphs: 17 %
MCH: 24.5 pg — ABNORMAL LOW (ref 26.6–33.0)
MCHC: 31 g/dL — ABNORMAL LOW (ref 31.5–35.7)
MCV: 79 fL (ref 79–97)
Monocytes Absolute: 0.4 10*3/uL (ref 0.1–0.9)
Monocytes: 7 %
Neutrophils Absolute: 4.9 10*3/uL (ref 1.4–7.0)
Neutrophils: 73 %
Platelets: 208 10*3/uL (ref 150–450)
RBC: 4.24 x10E6/uL (ref 3.77–5.28)
RDW: 15.1 % (ref 11.7–15.4)
WBC: 6.5 10*3/uL (ref 3.4–10.8)

## 2019-10-15 LAB — BASIC METABOLIC PANEL
BUN/Creatinine Ratio: 27 (ref 12–28)
BUN: 26 mg/dL (ref 8–27)
CO2: 23 mmol/L (ref 20–29)
Calcium: 9.1 mg/dL (ref 8.7–10.3)
Chloride: 107 mmol/L — ABNORMAL HIGH (ref 96–106)
Creatinine, Ser: 0.96 mg/dL (ref 0.57–1.00)
GFR calc Af Amer: 61 mL/min/{1.73_m2} (ref >59)
GFR calc non Af Amer: 53 mL/min/{1.73_m2} — ABNORMAL LOW (ref >59)
Glucose: 120 mg/dL — ABNORMAL HIGH (ref 65–99)
Potassium: 4.2 mmol/L (ref 3.5–5.2)
Sodium: 145 mmol/L — ABNORMAL HIGH (ref 134–144)

## 2019-10-15 NOTE — Progress Notes (Signed)
EPIC Encounter for ICM Monitoring  Patient Name: Barbara Blackburn is a 86 y.o. female Date: 10/15/2019 Primary Care Physican: Venita Lick, NP Primary Cardiologist:Gollan Electrophysiologist:Klein 10/15/2019 Weight: 177lbs    Spoke with daughter Barbara Blackburn. Patient is doing well and no fluid symptoms. She reports Dr Barbara Blackburn spoke with her about the end of life and possibly palliative care in the near future.   OptivolThoracic impedancenormal.  Prescribed: Furosemide40 mgTake 3 tablets (120 mg) by mouth once every other day in the morning.   Labs: 10/14/2019 Creatinine 0.96, BUN 26, Potassium 4.2, Sodium 145, GFR 53-61 01/16/2019 Creatinine 0.89, BUN 20, Potassium 4.2, Sodium 141, GFR 58-67 11/07/2018 Creatinine0.78, BUN23, Potassium3.9, [id number], [id number] A complete set of results can be found in Results Review.  Recommendations:Encouraged to call if experiencing fluid symptoms.  Follow-up plan: ICM clinic phone appointment on3/22/2021.  91 day device clinic remote transmission4/27/2021.      Copy of ICM check sent to Dr. Caryl Blackburn.   3 month ICM trend: 10/13/2019    1 Year ICM trend:       Barbara Billings, RN 10/15/2019 5:03 PM

## 2019-10-16 ENCOUNTER — Other Ambulatory Visit: Payer: Medicare Other | Admitting: Primary Care

## 2019-10-16 ENCOUNTER — Other Ambulatory Visit: Payer: Self-pay

## 2019-10-16 DIAGNOSIS — Z515 Encounter for palliative care: Secondary | ICD-10-CM

## 2019-10-16 NOTE — Progress Notes (Signed)
[facility] Palliative Care Consult Note Telephone: [phone]  Fax: [phone]  TELEHEALTH VISIT STATEMENT Due to the COVID-19 crisis, this visit was done via telemedicine from my office. It was initiated and consented to by this patient and/or family.  PATIENT NAME: Barbara Blackburn [address] [place] [address] [phone] (home)  DOB: 1928-09-29 MRN: [id number]  PRIMARY CARE PROVIDER:   Venita Lick, NP, [place] [address] [phone]  REFERRING PROVIDER:  Venita Lick, NP [address] [place],  [place] [address] [phone]  RESPONSIBLE PARTY:   Extended Emergency Contact Information Primary Emergency Contact: [place] Phone: [phone] Mobile Phone: [phone] Relation: Daughter   I met with Barbara Blackburn by telephonic means due to no video available.  ASSESSMENT AND RECOMMENDATIONS:   1. Advance Care Planning/Goals of Care: Goals include to maximize quality of life and symptom management. I met with daughter who is caregiver.Patient  had some strokes about 20 years ago, pacemaker placed, and in 2007 had a SADS episode in church and was resuscitated. Has CHF as well. [place] and had a brain bleed, fx orbital bone in 2019.  Discussed goals of care with cardiology RE turning off defibrillator. They are  considering this choice. We discussed dementia and the disease course, in light of her cardiac considerations. We discussed her advance directives/living will which include not artificially extending life. PCG discussed what this would look like with ICD, and that she and her siblings have discussed. We will discuss MOST on next visit.   2. Symptom Management:   Memory: Declining rapidly per daughter. Lives in Mother in law apartment next to daughter's residence. Has mixed dementia, vascular and AD. States she wants to go to heaven and stop taking medicine. We discussed  course of AD and planning for decline.  Falls: Would  not wear skid proof socks, but daughter only provided skid proof. Now she's wearing them. She frequently forgets to use her walker and has had some falls. Elopement is a risk per her daughter and she is fearful of exposure at this time of year. We discussed getting some locks on the door and mitigating fire and other injury risks. They currently have an alarm but patient is often up at night and tries to go out multiple times.   Behavior disturbances: Going to bed at 3-4 PM, and getting up at midnight for the next day. States auditory hallucinations. She opens up door multiple times a night which sets off an alarm waking her daughter who is not able to get rest. She is cursing when she never did before. Daughter endorses sundowning.   Caregiving; Becoming onerous due to insomnia, wandering and some resistance to  Care. We dicussed getting her on a better day night schedule so care givers will be able to provide needed care. She does not want to place her so we discussed getting more caregiving in for daily assistance. We discussed placement but this would be a very distant choice if other options are open. I advised her to look at [place] website for more information on interaction  eg reducing choices, giving direct and calm directions. Daughter did class with Dementia Care at [facility] hospice some years ago. Patient and daughter have been doing some puzzles She has been able to do igsaw puzzles with  300-500 pieces. She is not able to tell time or read a calendar now.  Insomnia: Melatonin recommended  10 mg at hs.  Daughter stopped this some ago. I recommend to restart closer to conventional bed time.  3. Family /Caregiver/Community Supports: Daughter is care giver. She gives patient a bath but meets some resistance. There is a hired person one day a week.   4. Cognitive / Functional decline: Daughter states she cannot retain information or reason.  She is falling more due to leaving her walker behind.  5. Follow up Palliative Care Visit: Palliative care will continue to follow for goals of care clarification and symptom management. Return 4 weeks or prn.  I spent 80 minutes providing this consultation,  from 1415 to 1535. More than 50% of the time in this consultation was spent coordinating communication.   HISTORY OF PRESENT ILLNESS:  Barbara Blackburn is a 86 y.o. year old female with multiple medical problems including CHF, SADS, mixed dementia, frequent falls. Palliative Care was asked to follow this patient by consultation request of Venita Lick, NP to help address advance care planning and goals of care. This is the initial visit.  CODE STATUS: Has advance directives with DNR. We will continue to discuss.  PPS: 40% HOSPICE ELIGIBILITY/DIAGNOSIS: no  PAST MEDICAL HISTORY:  Past Medical History:  Diagnosis Date  . 6949-lead 11/11/2013  . Allergy   . CHF (congestive heart failure) (HCC)    class 2  . Chronic atrial fibrillation ([place])   . Coronary artery disease   . Degenerative joint disease    severe  . Dyslipidemia   . Hyperlipidemia   . ICD-Medtronic 05/10/2009   Qualifier: Diagnosis of  By: Lovena Le, MD, [facility], Binnie Kand   . Nonischemic cardiomyopathy [facility])     SOCIAL HX:  Social History   Tobacco Use  . Smoking status: Never Smoker  . Smokeless tobacco: Never Used  Substance Use Topics  . Alcohol use: No    ALLERGIES:  Allergies  Allergen Reactions  . Clonazepam     Nightmare   . Fluoxetine Hcl Other (See Comments)    nightmares  . Lisinopril     Cough   . Zolpidem Tartrate Other (See Comments)    Nightmares      PERTINENT MEDICATIONS:  Outpatient Encounter Medications as of 10/16/2019  Medication Sig  . busPIRone (BUSPAR) 5 MG tablet Take 5 mg by mouth daily as needed.   . Cholecalciferol (VITAMIN D) 2000 units tablet Take 2,000 Units by mouth daily.  . furosemide (LASIX) 40 MG tablet Take 3  tablets (120 mg) by mouth once every other day in the morning  . galantamine (RAZADYNE) 4 MG tablet Take 1 tablet (4 mg total) by mouth 2 (two) times daily with a meal.  . losartan (COZAAR) 25 MG tablet Take 1 tablet (25 mg total) by mouth daily.  . metoprolol succinate (TOPROL-XL) 25 MG 24 hr tablet Take 1 tablet (25 mg total) by mouth daily.  [name] nystatin cream (MYCOSTATIN) Apply 1 application topically 2 (two) times daily.  [name] omeprazole (PRILOSEC) 20 MG capsule Take 20 mg by mouth daily as needed.  . rosuvastatin (CRESTOR) 10 MG tablet Take 1 tablet (10 mg total) by mouth daily.  . sertraline (ZOLOFT) 100 MG tablet Take 1 tablet (100 mg total) by mouth daily.  [name] spironolactone (ALDACTONE) 25 MG tablet Take 0.5 tablets (12.5 mg total) by mouth daily.  Alveda Reasons 20 MG TABS tablet TAKE 1 TABLET BY MOUTH DAILY WITH SUPPER   No facility-administered encounter medications on file as of 10/16/2019.    PHYSICAL  EXAM / ROS:   Deferred Jason Coop, NP [facility]

## 2019-10-20 ENCOUNTER — Other Ambulatory Visit: Payer: Self-pay | Admitting: Nurse Practitioner

## 2019-10-20 ENCOUNTER — Ambulatory Visit: Payer: Medicare Other | Admitting: Nurse Practitioner

## 2019-10-22 ENCOUNTER — Telehealth: Payer: Self-pay | Admitting: Internal Medicine

## 2019-10-22 NOTE — Telephone Encounter (Signed)
Deboraha Sprang, MD  Venita Lick, NP; Emily Filbert, RN  Thx so much for getting back to [place] so we know the ball didn't get dropped :))) steve       Previous Messages   ----- Message -----  From: Venita Lick, NP  Sent: 10/16/2019 12:20 PM EST  To: Deboraha Sprang, MD   I received this and have reached out to staff to schedule her for next week in office visit. Thank you.  ----- Message -----  From: Deboraha Sprang, MD  Sent: 10/16/2019 12:14 PM EST  To: Emily Filbert, RN, Venita Lick, NP

## 2019-10-22 NOTE — Telephone Encounter (Signed)
Patient daughter returning call  °

## 2019-10-22 NOTE — Telephone Encounter (Signed)
Absolutely.  I had reminder set this week to do this, but she rescheduled.  Will check this on visit next week and set reminder.  Glad to help.

## 2019-10-22 NOTE — Telephone Encounter (Signed)
Barbara Sprang, MD  10/16/2019 12:14 PM EST    Please Inform Patient  Labs are normal x significant new anemia and decreased MCV concerning for blood loss 2 things pz 1) lets check ferritin and FE/TIBC 2)will need to followup with PCP re stool quaiac assessment Jolene could you let us know that you get this   Thanks

## 2019-10-22 NOTE — Telephone Encounter (Signed)
I spoke with the patient's daughter, Barbara Blackburn regarding the patient's most recent BMP/ CBC results. Barbara Blackburn is aware the patient's Hbg has dropped since May 2020 and Dr. Caryl Comes has reached out to her PCP to assist with the work up of this.   Per Barbara Blackburn, the patient has an appointment with [name], NP on 10/29/19 to follow up. She also advised the patient has been having some bleeding per rectum about once a week.  She has known hemrrhoids and a rectocele. She is not aware if the patient has been straining for bowel movements.  I have asked Barbara Blackburn to please continue to monitor her bleeding and see if this becomes more frequent.   I have advised Barbara Blackburn that Dr. Caryl Comes would like a ferritin level & FE/ TIBC drawn as well. Barbara Blackburn is aware I will reach out to [facility], NP to see if she would be agreeable to drawing these labs at her visit on 3/3 to save the patient another trip out due to her dementia and mobility issues.  Barbara Blackburn is aware I will call her back if there is an issue getting these labs with her PCP. She was very appreciative for the call back and voiced understanding of the above.

## 2019-10-22 NOTE — Telephone Encounter (Signed)
Attempted to call the patient's daughter, [name] (ok per DPR). No answer- I left a detailed message of results and Dr. Olin Pia recommendations (ok per DPR).  I asked that [name] call back to discuss obtaining additional lab studies.

## 2019-10-29 ENCOUNTER — Ambulatory Visit (INDEPENDENT_AMBULATORY_CARE_PROVIDER_SITE_OTHER): Payer: Medicare Other | Admitting: Nurse Practitioner

## 2019-10-29 ENCOUNTER — Encounter: Payer: Self-pay | Admitting: Nurse Practitioner

## 2019-10-29 ENCOUNTER — Other Ambulatory Visit: Payer: Self-pay

## 2019-10-29 VITALS — BP 127/63 | HR 65 | Temp 98.2°F | Wt 175.6 lb

## 2019-10-29 DIAGNOSIS — E559 Vitamin D deficiency, unspecified: Secondary | ICD-10-CM

## 2019-10-29 DIAGNOSIS — E7800 Pure hypercholesterolemia, unspecified: Secondary | ICD-10-CM

## 2019-10-29 DIAGNOSIS — F320 Major depressive disorder, single episode, mild: Secondary | ICD-10-CM

## 2019-10-29 DIAGNOSIS — G301 Alzheimer's disease with late onset: Secondary | ICD-10-CM | POA: Diagnosis not present

## 2019-10-29 DIAGNOSIS — K640 First degree hemorrhoids: Secondary | ICD-10-CM | POA: Diagnosis not present

## 2019-10-29 DIAGNOSIS — E538 Deficiency of other specified B group vitamins: Secondary | ICD-10-CM

## 2019-10-29 DIAGNOSIS — F0280 Dementia in other diseases classified elsewhere without behavioral disturbance: Secondary | ICD-10-CM

## 2019-10-29 DIAGNOSIS — D649 Anemia, unspecified: Secondary | ICD-10-CM | POA: Insufficient documentation

## 2019-10-29 DIAGNOSIS — N816 Rectocele: Secondary | ICD-10-CM | POA: Diagnosis not present

## 2019-10-29 LAB — CBC WITH DIFFERENTIAL/PLATELET
Hematocrit: 33 % — ABNORMAL LOW (ref 34.0–46.6)
Hemoglobin: 10.3 g/dL — ABNORMAL LOW (ref 11.1–15.9)
Lymphocytes Absolute: 1.1 10*3/uL (ref 0.7–3.1)
Lymphs: 15 %
MCH: 24.9 pg — ABNORMAL LOW (ref 26.6–33.0)
MCHC: 31.2 g/dL — ABNORMAL LOW (ref 31.5–35.7)
MCV: 80 fL (ref 79–97)
MID (Absolute): 0.5 10*3/uL (ref 0.1–1.6)
MID: 6 %
Neutrophils Absolute: 5.7 10*3/uL (ref 1.4–7.0)
Neutrophils: 79 %
Platelets: 252 10*3/uL (ref 150–450)
RBC: 4.13 x10E6/uL (ref 3.77–5.28)
RDW: 17.4 % — ABNORMAL HIGH (ref 11.7–15.4)
WBC: 7.3 10*3/uL (ref 3.4–10.8)

## 2019-10-29 MED ORDER — HYDROCORTISONE (PERIANAL) 2.5 % EX CREA: 1.0000 "application " | TOPICAL_CREAM | Freq: Two times a day (BID) | CUTANEOUS | 0 refills | Status: DC

## 2019-10-29 MED ORDER — SUCRALFATE 1 G PO TABS: 1.0000 g | ORAL_TABLET | Freq: Three times a day (TID) | ORAL | 0 refills | Status: AC

## 2019-10-29 NOTE — Assessment & Plan Note (Addendum)
Present for several years.  Will refer to GI at this time due to anemia and rectal bleeding, but may need general surgery referral in future, difficult exam due to patient comfort.  They report history of rectocele and prominent prolapse is noted from vaginal canal -- difficult to assess whether rectocele or cystocele due to patient position and comfort level. Patient does not want surgical intervention or hospitalization, would be poor surgical candidate due to her advanced age and dementia.

## 2019-10-29 NOTE — Progress Notes (Addendum)
BP 127/63   Pulse 65   Temp 98.2 F (36.8 C) (Oral)   Wt 175 lb 9.6 oz (79.7 kg)   LMP  (LMP Unknown)   SpO2 96%   BMI 33.18 kg/m    Subjective:    Patient ID: Barbara Blackburn, female    DOB: 11/14/1928, 86 y.o.   MRN: [id number]  HPI: Barbara Blackburn is a 86 y.o. female  Chief Complaint  Patient presents with  . Depression  . Dementia   DEMENTIA: Chronic, progressive in nature. Saw neurology 02/17/19, they started her Namenda along with her Galantamine. The Namenda caused behaviors issues so they stopped. They also prescribed Buspar as needed for anxiety and her daughter has been giving her 1/2 pill PRN as whole 5 MG is too strong. Patient and daughter deny N&V, CP, dizziness, syncope, headache, fatigue, or decreased appetite at this time. Palliative care is currently involved and visiting.    Patient continues to eat well at home per her daughter report, no recent falls.Has history of fall last year with injury, x 2 falls last week no major injuries reported but bruising present.She lives with daughter in apartment attached to house. Has had some weight loss over past year, but daughter weighs daily and appears to be stabilizing at this time, daughter has been providing her Ensure as instructed. She is beginning to go to bed at 4 PM and then waking up at midnight and 2:30 AM, getting up and out of bed. Her daughter is unsure she is taking her pills, as sometimes finding them on the floor and different areas.  Her daughter reports major caregiver strain with patient increased behaviors and sun downing.     DEPRESSION Having increased behaviors and sun downing, increased Sertraline to 100 MG last visit. Mood status: uncontrolled Satisfied with current treatment?: yes Symptom severity: moderate  Duration of current treatment : chronic Side effects: no Medication compliance: good compliance Psychotherapy/counseling: none Depressed mood: yes Anxious mood:  yes Anhedonia: no Significant weight loss or gain: no Insomnia: yes hard to stay asleep Fatigue: no Feelings of worthlessness or guilt: no Impaired concentration/indecisiveness: no Suicidal ideations: no Hopelessness: no Crying spells: no Depression screen [facility] 2/9 10/29/2019 04/07/2019 03/20/2018 02/26/2018 10/05/2017  Decreased Interest 1 0 1 2 1   Down, Depressed, Hopeless 3 1 1 2 1   PHQ - 2 Score 4 1 2 4 2   Altered sleeping 1 - 0 0 1  Tired, decreased energy 3 - 2 2 1   Change in appetite 1 - 2 0 0  Feeling bad or failure about yourself  3 - 0 1 1  Trouble concentrating 1 - 1 0 0  Moving slowly or fidgety/restless 1 - 0 0 0  Suicidal thoughts 1 - 0 0 0  PHQ-9 Score 15 - 7 7 5   Difficult doing work/chores Somewhat difficult - Somewhat difficult - Somewhat difficult  Some recent data might be hidden   ANEMIA Noted recently on labs with cardiology with H/H 10.4/33.5, MCV 79.  Dr. Caryl Comes reached out to PCP to notify and obtain further labs.  Previous H/H 14.4/42.6, MCV 84.  Is on Xarelto for cardiac health.  Has hemorrhoids and rectocele, rectocele being diagnosed several years ago per her daughter's report and continues to be prominent, daughter reports that providers told her "just watch it"  Did have two episodes of rectal bleeding last Tuesday, has had this in past sporadically but not to last weeks extent and often in past episodes would  dissipate, but this time she continues to have rectal bleeding.  Last Tuesday caregiver was assisting with bathing and had episode of rectal bleeding, stated "it was everywhere".  Then that evening had another episode of bleeding that had splattered -- had to steam mop floor 3 times.    Fell on Valentine's day and then last week had fall, patient reports her knees gave out.  Has history of occasional falls in past and uses walker.  Bruising present to extremities, but no other injuries reported.  Patient and daughter both report patient has more fatigue, cold  intolerance, SOB, and palpitations over past week.  No current iron supplements. Anemia status: new onset Etiology of anemia: unknown Severity of anemia: moderate Fatigue: yes Decreased exercise tolerance: yes  Dyspnea on exertion: yes Palpitations: yes Bleeding: yes Pica: no  Relevant past medical, surgical, family and social history reviewed and updated as indicated. Interim medical history since our last visit reviewed. Allergies and medications reviewed and updated.  Review of Systems  Constitutional: Positive for fatigue. Negative for activity change, appetite change, diaphoresis and fever.  Respiratory: Positive for shortness of breath. Negative for cough, chest tightness and wheezing.   Cardiovascular: Positive for palpitations. Negative for chest pain and leg swelling.  Gastrointestinal: Positive for abdominal pain (once a few days ago and no further) and blood in stool. Negative for abdominal distention, constipation, diarrhea, nausea, rectal pain and vomiting.  Endocrine: Positive for cold intolerance. Negative for heat intolerance.  Neurological: Positive for dizziness and weakness. Negative for light-headedness and headaches.  Psychiatric/Behavioral: Positive for sleep disturbance. Negative for self-injury and suicidal ideas. The patient is nervous/anxious.     Per HPI unless specifically indicated above     Objective:    BP 127/63   Pulse 65   Temp 98.2 F (36.8 C) (Oral)   Wt 175 lb 9.6 oz (79.7 kg)   LMP  (LMP Unknown)   SpO2 96%   BMI 33.18 kg/m   Wt Readings from Last 3 Encounters:  10/29/19 175 lb 9.6 oz (79.7 kg)  10/14/19 177 lb 6 oz (80.5 kg)  05/21/19 163 lb 8 oz (74.2 kg)    Physical Exam Vitals and nursing note reviewed.  Constitutional:      General: She is awake. She is not in acute distress.    Appearance: She is well-developed, well-groomed and overweight. She is not ill-appearing.  HENT:     Head: Normocephalic.     Right Ear: Hearing  normal.     Left Ear: Hearing normal.     Mouth/Throat:     Mouth: Mucous membranes are moist. Mucous membranes are pale.     Comments: Mild paleness noted under tongue, gums pinks Eyes:     General: Lids are normal.        Right eye: No discharge.        Left eye: No discharge.     Pupils: Pupils are equal, round, and reactive to light.     Comments: Mild conjunctival pallor noted bilaterally  Neck:     Vascular: No carotid bruit.  Cardiovascular:     Rate and Rhythm: Normal rate. Rhythm irregularly irregular.     Heart sounds: Normal heart sounds. No murmur. No gallop.   Pulmonary:     Effort: Pulmonary effort is normal. No accessory muscle usage or respiratory distress.     Breath sounds: Normal breath sounds.  Abdominal:     General: Bowel sounds are normal. There is no distension.  Palpations: Abdomen is soft.     Tenderness: There is no abdominal tenderness.  Genitourinary:    Rectum: External hemorrhoid present.     Comments: Moderate size prolapse protruding from vagina.  Several external hemorrhoids present. Dried blood noted on exterior of rectum with scant fresh bright red blood noted at 12 o'clock aspect.  Deferred rectal exam per patient request.  Difficult exam due to patient position and comfort level. Musculoskeletal:     Cervical back: Normal range of motion and neck supple.     Right lower leg: No edema.     Left lower leg: No edema.  Skin:    General: Skin is warm and dry.  Neurological:     Mental Status: She is alert.     Comments: Pleasantly confused.  Psychiatric:        Attention and Perception: Attention normal.        Mood and Affect: Mood normal.        Speech: Speech normal.        Behavior: Behavior normal. Behavior is cooperative.     Results for orders placed or performed in visit on 10/29/19  Lipid Panel w/o Chol/HDL Ratio  Result Value Ref Range   Cholesterol, Total 136 100 - 199 mg/dL   Triglycerides 226 (H) 0 - 149 mg/dL   HDL 59  >39 mg/dL   VLDL Cholesterol Cal 35 5 - 40 mg/dL   LDL Chol Calc (NIH) 42 0 - 99 mg/dL  CBC With Differential/Platelet  Result Value Ref Range   WBC 7.3 3.4 - 10.8 x10E3/uL   RBC 4.13 3.77 - 5.28 x10E6/uL   Hemoglobin 10.3 (L) 11.1 - 15.9 g/dL   Hematocrit 33.0 (L) 34.0 - 46.6 %   MCV 80 79 - 97 fL   MCH 24.9 (L) 26.6 - 33.0 pg   MCHC 31.2 (L) 31.5 - 35.7 g/dL   RDW 17.4 (H) 11.7 - 15.4 %   Platelets 252 150 - 450 x10E3/uL   Neutrophils 79 Not Estab. %   Lymphs 15 Not Estab. %   MID 6 Not Estab. %   Neutrophils Absolute 5.7 1.4 - 7.0 x10E3/uL   Lymphocytes Absolute 1.1 0.7 - 3.1 x10E3/uL   MID (Absolute) 0.5 0.1 - 1.6 X10E3/uL  Anemia panel  Result Value Ref Range   Total Iron Binding Capacity 354 250 - 450 ug/dL   UIBC 334 118 - 369 ug/dL   Iron 20 (L) 27 - 139 ug/dL   Iron Saturation 6 (LL) 15 - 55 %   Vitamin B-12 380 232 - 1,245 pg/mL   Folate, Hemolysate WILL FOLLOW    Hematocrit WILL FOLLOW    Hematology Comments: WILL FOLLOW    Folate, RBC WILL FOLLOW    Ferritin 37 15 - 150 ng/mL   Retic Ct Pct WILL FOLLOW   VITAMIN D 25 Hydroxy (Vit-D Deficiency, Fractures)  Result Value Ref Range   Vit D, 25-Hydroxy 37.3 30.0 - 100.0 ng/mL      Assessment & Plan:   Problem List Items Addressed This Visit      Cardiovascular and Mediastinum   Grade I hemorrhoids    Referral to GI placed.  Anusol script sent.      Relevant Orders   Ambulatory referral to Gastroenterology     Digestive   Rectocele    Present for several years.  Will refer to GI at this time due to anemia and rectal bleeding, but may need general surgery referral in future,  difficult exam due to patient comfort.  They report history of rectocele and prominent prolapse is noted from vaginal canal -- difficult to assess whether rectocele or cystocele due to patient position and comfort level. Patient does not want surgical intervention or hospitalization, would be poor surgical candidate due to her advanced age  and dementia.      Relevant Orders   Ambulatory referral to Gastroenterology     Nervous and Auditory   Dementia ([place]) - Primary    Chronic, progressive.  Continue current medication regimen and collaboration with neurology.  Continue Sertraline 100 MG and continue Buspar as needed for behaviors, may consider addition of Trazodone for sleep in future, however wish to keep medication to minimal to prevent falls. Collaborate with CCM team for caregiver support.  Palliative team involved.        Other   Hypercholesterolemia    Chronic, ongoing.  Continue current medication regimen and collaboration with cardiology.  Lipid panel today.      Relevant Orders   Lipid Panel w/o Chol/HDL Ratio (Completed)   Depression, major, single episode, mild (HCC)    Chronic, ongoing.  Continue Sertraline 100 MG and recommend continuation of Buspar as needed.  Having increased behaviors and sun downing events.  Will work with CCM team and see if can assist with daughter caregiver burnout, offer support.        Vitamin D deficiency    On review history of low levels, no current supplement.  Check level today.      Relevant Orders   VITAMIN D 25 Hydroxy (Vit-D Deficiency, Fractures) (Completed)   B12 deficiency    No current supplement, check B12 level today. Was lower on previous labs.      Relevant Orders   Anemia panel (Completed)   Low hemoglobin    New onset with increased rectal bleeding present.  Urgent referral to GI placed.  H/H today has not declined significantly 10.3/33.0, MCV 79.9.  Patient does not want hospitalization or surgical intervention, would be poor surgical candidate with advanced age and memory + health issues.  Obtain iron, ferritin, TIBC, urine, stool guac.  Carafate script sent.  Will reach out to Dr. Caryl Comes to discuss Xarelto, since no decline in H/H may be okay to continue at this time until further work-up by GI.       Relevant Orders   CBC With Differential/Platelet  (Completed)   Anemia panel (Completed)   UA/M w/rflx Culture, Routine   Fecal occult blood, imunochemical   Ambulatory referral to Gastroenterology      Time: 25 minutes, >50% spent counseling/or care coordination   Follow up plan: Return in about 1 week (around 11/05/2019) for Anemia.

## 2019-10-29 NOTE — Assessment & Plan Note (Signed)
Chronic, ongoing.  Continue current medication regimen and collaboration with cardiology.  Lipid panel today. 

## 2019-10-29 NOTE — Assessment & Plan Note (Signed)
On review history of low levels, no current supplement.  Check level today.

## 2019-10-29 NOTE — Assessment & Plan Note (Signed)
Referral to GI placed.  Anusol script sent.

## 2019-10-29 NOTE — Assessment & Plan Note (Addendum)
Chronic, progressive.  Continue current medication regimen and collaboration with neurology.  Continue Sertraline 100 MG and continue Buspar as needed for behaviors, may consider addition of Trazodone for sleep in future, however wish to keep medication to minimal to prevent falls. Collaborate with CCM team for caregiver support.  Palliative team involved.

## 2019-10-29 NOTE — Assessment & Plan Note (Signed)
Chronic, ongoing.  Continue Sertraline 100 MG and recommend continuation of Buspar as needed.  Having increased behaviors and sun downing events.  Will work with CCM team and see if can assist with daughter caregiver burnout, offer support.

## 2019-10-29 NOTE — Assessment & Plan Note (Signed)
No current supplement, check B12 level today. Was lower on previous labs.

## 2019-10-29 NOTE — Assessment & Plan Note (Signed)
New onset with increased rectal bleeding present.  Urgent referral to GI placed.  H/H today has not declined significantly 10.3/33.0, MCV 79.9.  Patient does not want hospitalization or surgical intervention, would be poor surgical candidate with advanced age and memory + health issues.  Obtain iron, ferritin, TIBC, urine, stool guac.  Carafate script sent.  Will reach out to Dr. Caryl Comes to discuss Xarelto, since no decline in H/H may be okay to continue at this time until further work-up by GI.

## 2019-10-29 NOTE — Patient Instructions (Signed)
Anemia  Anemia is a condition in which you do not have enough red blood cells or hemoglobin. Hemoglobin is a substance in red blood cells that carries oxygen. When you do not have enough red blood cells or hemoglobin (are anemic), your body cannot get enough oxygen and your organs may not work properly. As a result, you may feel very tired or have other problems. What are the causes? Common causes of anemia include:  Excessive bleeding. Anemia can be caused by excessive bleeding inside or outside the body, including bleeding from the intestine or from periods in women.  Poor nutrition.  Long-lasting (chronic) kidney, thyroid, and liver disease.  Bone marrow disorders.  Cancer and treatments for cancer.  HIV (human immunodeficiency virus) and AIDS (acquired immunodeficiency syndrome).  Treatments for HIV and AIDS.  Spleen problems.  Blood disorders.  Infections, medicines, and autoimmune disorders that destroy red blood cells. What are the signs or symptoms? Symptoms of this condition include:  Minor weakness.  Dizziness.  Headache.  Feeling heartbeats that are irregular or faster than normal (palpitations).  Shortness of breath, especially with exercise.  Paleness.  Cold sensitivity.  Indigestion.  Nausea.  Difficulty sleeping.  Difficulty concentrating. Symptoms may occur suddenly or develop slowly. If your anemia is mild, you may not have symptoms. How is this diagnosed? This condition is diagnosed based on:  Blood tests.  Your medical history.  A physical exam.  Bone marrow biopsy. Your health care provider may also check your stool (feces) for blood and may do additional testing to look for the cause of your bleeding. You may also have other tests, including:  Imaging tests, such as a CT scan or MRI.  Endoscopy.  Colonoscopy. How is this treated? Treatment for this condition depends on the cause. If you continue to lose a lot of blood, you may  need to be treated at a hospital. Treatment may include:  Taking supplements of iron, vitamin S31, or folic acid.  Taking a hormone medicine (erythropoietin) that can help to stimulate red blood cell growth.  Having a blood transfusion. This may be needed if you lose a lot of blood.  Making changes to your diet.  Having surgery to remove your spleen. Follow these instructions at home:  Take over-the-counter and prescription medicines only as told by your health care provider.  Take supplements only as told by your health care provider.  Follow any diet instructions that you were given.  Keep all follow-up visits as told by your health care provider. This is important. Contact a health care provider if:  You develop new bleeding anywhere in the body. Get help right away if:  You are very weak.  You are short of breath.  You have pain in your abdomen or chest.  You are dizzy or feel faint.  You have trouble concentrating.  You have bloody or black, tarry stools.  You vomit repeatedly or you vomit up blood. Summary  Anemia is a condition in which you do not have enough red blood cells or enough of a substance in your red blood cells that carries oxygen (hemoglobin).  Symptoms may occur suddenly or develop slowly.  If your anemia is mild, you may not have symptoms.  This condition is diagnosed with blood tests as well as a medical history and physical exam. Other tests may be needed.  Treatment for this condition depends on the cause of the anemia. This information is not intended to replace advice given to you by  your health care provider. Make sure you discuss any questions you have with your health care provider. Document Revised: 07/27/2017 Document Reviewed: 09/15/2016 Elsevier Patient Education  [place].

## 2019-10-30 ENCOUNTER — Ambulatory Visit (INDEPENDENT_AMBULATORY_CARE_PROVIDER_SITE_OTHER): Payer: Medicare Other | Admitting: Gastroenterology

## 2019-10-30 ENCOUNTER — Encounter: Payer: Self-pay | Admitting: Gastroenterology

## 2019-10-30 ENCOUNTER — Other Ambulatory Visit: Payer: Medicare Other

## 2019-10-30 VITALS — BP 137/73 | HR 65 | Temp 97.5°F | Wt 175.4 lb

## 2019-10-30 DIAGNOSIS — K625 Hemorrhage of anus and rectum: Secondary | ICD-10-CM

## 2019-10-30 DIAGNOSIS — K644 Residual hemorrhoidal skin tags: Secondary | ICD-10-CM | POA: Diagnosis not present

## 2019-10-30 DIAGNOSIS — D649 Anemia, unspecified: Secondary | ICD-10-CM | POA: Diagnosis not present

## 2019-10-30 LAB — UA/M W/RFLX CULTURE, ROUTINE
Bilirubin, UA: NEGATIVE
Glucose, UA: NEGATIVE
Ketones, UA: NEGATIVE
Leukocytes,UA: NEGATIVE
Nitrite, UA: NEGATIVE
Protein,UA: NEGATIVE
RBC, UA: NEGATIVE
Specific Gravity, UA: 1.02 (ref 1.005–1.030)
Urobilinogen, Ur: 0.2 mg/dL (ref 0.2–1.0)
pH, UA: 7 (ref 5.0–7.5)

## 2019-10-30 MED ORDER — HYDROCORTISONE ACETATE 25 MG RE SUPP: 25.0000 mg | Freq: Two times a day (BID) | RECTAL | 1 refills | Status: DC

## 2019-10-30 NOTE — Progress Notes (Signed)
Spoke to patient daughter on phone, did recommend starting Slow Fe, discuss with GI first as they see Dr. Marius Ditch this afternoon.  Iron level is low, may benefit from B12 as well since level on low side of normal.  Would be cautious of ferrous sulfate to avoid constipation with her prolapse, discussed preference for Slow Fe.  They plan to further discuss with GI.

## 2019-10-30 NOTE — Progress Notes (Signed)
Contacted via MyChart

## 2019-10-30 NOTE — Progress Notes (Signed)
Cephas Darby, MD [address]  [place]  [place], [place] [address]  Main: [phone]  Fax: [phone]    Gastroenterology Consultation  Referring Provider:     Venita Lick, NP Primary Care Physician:  Venita Lick, NP Primary Gastroenterologist:  Dr. Cephas Darby Reason for Consultation:  Rectal bleeding, rectal pain        HPI:   Barbara KATA is a 86 y.o. female referred by Dr. Venita Lick, NP  for consultation & management of rectal bleeding and rectal discomfort.  Patient has history of chronic A. fib on Xarelto, dementia, limited ADLs, multiple comorbidities as mentioned below.  Patient is seen as an urgent visit today as requested by her PCP as there was concern about rectocele.  Patient is accompanied by her daughter who is her caregiver and lives with her.  For the last few months, she has been having episodes of bright red blood per rectum associated with rectal discomfort.  According to patient's daughter, she had 1 severe episode of fresh blood dripping into the toilet bowel associated with on wiping.  She also has severe rectal discomfort.  Patient denies pushing or straining or constipation.  She spends most of the time sitting at home, very limited ambulation.  Labs revealed mild anemia, hemoglobin 10.3, baseline 14.4 in 12/2018.  Iron studies today revealed low iron, normal B12, folate levels pending  NSAIDs: None  Antiplts/Anticoagulants/Anti thrombotics: Xarelto for A. fib  GI Procedures: Reportedly had a colonoscopy about 6 to 8 years ago, polyps were detected.  Past Medical History:  Diagnosis Date  . 6949-lead 11/11/2013  . Allergy   . CHF (congestive heart failure) (HCC)    class 2  . Chronic atrial fibrillation ([address])   . Coronary artery disease   . Degenerative joint disease    severe  . Dyslipidemia   . Hyperlipidemia   . ICD-Medtronic 05/10/2009   Qualifier: Diagnosis of  By: Lovena Le, MD, [facility], Binnie Kand   .  Nonischemic cardiomyopathy [facility])     Past Surgical History:  Procedure Laterality Date  . ICD GENERATOR CHANGEOUT N/A 12/06/2016   Procedure: ICD Generator Changeout;  Surgeon: Deboraha Sprang, MD;  Location: [facility];  Service: Cardiovascular;  Laterality: N/A;  . Medtronic dual-chamber ICD  01/05/2006   generator change April 2012  . PCI stent    . REPLACEMENT TOTAL KNEE      Current Outpatient Medications:  .  busPIRone (BUSPAR) 5 MG tablet, Take 5 mg by mouth daily as needed. , Disp: , Rfl:  .  Cholecalciferol (VITAMIN D) 2000 units tablet, Take 2,000 Units by mouth daily., Disp: , Rfl:  .  furosemide (LASIX) 40 MG tablet, Take 3 tablets (120 mg) by mouth once every other day in the morning, Disp: , Rfl:  .  galantamine (RAZADYNE) 4 MG tablet, Take 1 tablet (4 mg total) by mouth 2 (two) times daily with a meal., Disp: 180 tablet, Rfl: 0 .  losartan (COZAAR) 25 MG tablet, Take 1 tablet (25 mg total) by mouth daily., Disp: 90 tablet, Rfl: 3 .  Melatonin 10 MG TABS, Take 1 tablet by mouth daily., Disp: , Rfl:  .  metoprolol succinate (TOPROL-XL) 25 MG 24 hr tablet, Take 1 tablet (25 mg total) by mouth daily., Disp: 90 tablet, Rfl: 3 .  nystatin cream (MYCOSTATIN), Apply 1 application topically 2 (two) times daily., Disp: 30 g, Rfl: 2 .  omeprazole (PRILOSEC) 20 MG  capsule, Take 20 mg by mouth daily as needed., Disp: , Rfl:  .  rosuvastatin (CRESTOR) 10 MG tablet, Take 1 tablet (10 mg total) by mouth daily., Disp: 60 tablet, Rfl: 0 .  sertraline (ZOLOFT) 100 MG tablet, Take 1 tablet (100 mg total) by mouth daily., Disp: 90 tablet, Rfl: 3 .  XARELTO 20 MG TABS tablet, TAKE 1 TABLET BY MOUTH DAILY WITH SUPPER, Disp: 90 tablet, Rfl: 1 .  hydrocortisone (ANUSOL-HC) 2.5 % rectal cream, Place 1 application rectally 2 (two) times daily. (Patient not taking: Reported on 10/30/2019), Disp: 30 g, Rfl: 0 .  hydrocortisone (ANUSOL-HC) 25 MG suppository, Place 1 suppository (25 mg total) rectally  2 (two) times daily., Disp: 12 suppository, Rfl: 1 .  spironolactone (ALDACTONE) 25 MG tablet, Take 0.5 tablets (12.5 mg total) by mouth daily., Disp: , Rfl:  .  sucralfate (CARAFATE) 1 g tablet, Take 1 tablet (1 g total) by mouth 4 (four) times daily -  with meals and at bedtime. (Patient not taking: Reported on 10/30/2019), Disp: 90 tablet, Rfl: 0   Family History  Problem Relation Age of Onset  . Heart attack Father   . Cancer Mother        colon  . Hypertension Sister   . Aneurysm Sister   . Aneurysm Maternal Grandmother   . Cancer Sister        rectal     Social History   Tobacco Use  . Smoking status: Never Smoker  . Smokeless tobacco: Never Used  Substance Use Topics  . Alcohol use: No  . Drug use: No    Allergies as of 10/30/2019 - Review Complete 10/30/2019  Allergen Reaction Noted  . Clonazepam    . Fluoxetine hcl Other (See Comments)   . Lisinopril    . Zolpidem tartrate Other (See Comments)     Review of Systems:    All systems reviewed and negative except where noted in HPI.   Physical Exam:  BP 137/73 (BP Location: Left Arm, Patient Position: Sitting, Cuff Size: Normal)   Pulse 65   Temp (!) 97.5 F (36.4 C) (Oral)   Wt 175 lb 6 oz (79.5 kg)   LMP  (LMP Unknown)   BMI 33.14 kg/m  No LMP recorded (lmp unknown). Patient is postmenopausal.  General:   Alert,  Well-developed, well-nourished, pleasant and cooperative in NAD Head:  Normocephalic and atraumatic. Eyes:  Sclera clear, no icterus.   Conjunctiva pink. Ears:  Normal auditory acuity. Nose:  No deformity, discharge, or lesions. Mouth:  No deformity or lesions,oropharynx pink & moist. Neck:  Supple; no masses or thyromegaly. Lungs:  Respirations even and unlabored.  Clear throughout to auscultation.   No wheezes, crackles, or rhonchi. No acute distress. Heart: Irregular rate and rhythm; no murmurs, clicks, rubs, or gallops. Abdomen:  Normal bowel sounds. Soft, non-tender and non-distended  without masses, hepatosplenomegaly or hernias noted.  No guarding or rebound tenderness.   Rectal: Large cluster of perianal skin tags and mildly prolapsed external hemorrhoids with no stigmata of recent bleeding.  Digital rectal exam revealed mild tenderness in the rectum.  No evidence of gross blood. Msk:  Restricted mobility due to severe arthritis Pulses:  Normal pulses noted. Extremities:  No clubbing or edema.  No cyanosis. Neurologic:  Alert and oriented x3;  grossly normal neurologically. Skin:  Intact without significant lesions or rashes. No jaundice. Psych:  Alert and cooperative. Normal mood and affect.  Imaging Studies: Reviewed  Assessment and Plan:  TAUSHA SHIFF is a 86 y.o. female with obesity, CHF, chronic A. fib on Xarelto is seen as an urgent consultation for rectal bleeding, anemia and rectal discomfort Rectal exam revealed large perianal skin tags and prolapsed external hemorrhoids with no stigmata of recent bleeding.  There is no evidence of rectocele.  She does have mild uterine prolapse per vagina.  The rectal bleeding episodes are in setting of anticoagulation.  Most likely secondary to internal hemorrhoids or could have been an episode of diverticular bleed.  There is no active bleeding at this time.  I recommended trial of hydrocortisone suppository 2 times a day for inflamed external hemorrhoids.  Recommended to use a doughnut to alleviate pressure on the hemorrhoids.  MiraLAX and adequate intake of water daily.  Avoid straining. Risk of bleeding will be higher with hemorrhoid ligation with use of Xarelto.  I also discussed about possible colonoscopy depending on her clinical progress.  Patient will check with her PCP, will try to obtain a copy of her previous colonoscopy report   Follow up in 2 to 3 weeks as a virtual visit   Cephas Darby, MD

## 2019-10-31 LAB — ANEMIA PANEL
Ferritin: 37 ng/mL (ref 15–150)
Folate, Hemolysate: 366 ng/mL
Folate, RBC: 1080 ng/mL (ref >498)
Hematocrit: 33.9 % — ABNORMAL LOW (ref 34.0–46.6)
Iron Saturation: 6 % — CL (ref 15–55)
Iron: 20 ug/dL — ABNORMAL LOW (ref 27–139)
Retic Ct Pct: 1.5 % (ref 0.6–2.6)
Total Iron Binding Capacity: 354 ug/dL (ref 250–450)
UIBC: 334 ug/dL (ref 118–369)
Vitamin B-12: 380 pg/mL (ref 232–1245)

## 2019-10-31 LAB — LIPID PANEL W/O CHOL/HDL RATIO
Cholesterol, Total: 136 mg/dL (ref 100–199)
HDL: 59 mg/dL (ref >39)
LDL Chol Calc (NIH): 42 mg/dL (ref 0–99)
Triglycerides: 226 mg/dL — ABNORMAL HIGH (ref 0–149)
VLDL Cholesterol Cal: 35 mg/dL (ref 5–40)

## 2019-10-31 LAB — VITAMIN D 25 HYDROXY (VIT D DEFICIENCY, FRACTURES): Vit D, 25-Hydroxy: 37.3 ng/mL (ref 30.0–100.0)

## 2019-11-03 ENCOUNTER — Other Ambulatory Visit: Payer: Self-pay | Admitting: Nurse Practitioner

## 2019-11-03 ENCOUNTER — Ambulatory Visit: Payer: Medicare Other | Admitting: Nurse Practitioner

## 2019-11-03 NOTE — Telephone Encounter (Signed)
Requested Prescriptions  Pending Prescriptions Disp Refills  . rosuvastatin (CRESTOR) 10 MG tablet [Pharmacy Med Name: ROSUVASTATIN CALCIUM 10 MG TAB] 30 tablet 0    Sig: Take 1 tablet (10 mg total) by mouth daily.     Cardiovascular:  Antilipid - Statins Failed - 11/03/2019  2:18 PM      Failed - LDL in normal range and within 360 days    LDL Chol Calc (NIH)  Date Value Ref Range Status  10/29/2019 42 0 - 99 mg/dL Final         Failed - Triglycerides in normal range and within 360 days    Triglycerides  Date Value Ref Range Status  10/29/2019 226 (H) 0 - 149 mg/dL Final   Triglycerides Piccolo,Waived  Date Value Ref Range Status  09/01/2015 182 (H) <150 mg/dL Final    Comment:                            Normal                   <150                         Borderline High     150 - 199                         High                200 - 499                         Very High                >499          Passed - Total Cholesterol in normal range and within 360 days    Cholesterol, Total  Date Value Ref Range Status  10/29/2019 136 100 - 199 mg/dL Final   Cholesterol Piccolo, Waived  Date Value Ref Range Status  09/01/2015 145 <200 mg/dL Final    Comment:                            Desirable                <200                         Borderline High      200- 239                         High                     >239          Passed - HDL in normal range and within 360 days    HDL  Date Value Ref Range Status  10/29/2019 59 >39 mg/dL Final         Passed - Patient is not pregnant      Passed - Valid encounter within last 12 months    Recent Outpatient Visits          5 days ago Late onset Alzheimer's disease without behavioral disturbance ([place])   [place], [place] T, NP   1 month ago Late onset Alzheimer's  disease without behavioral disturbance ([place])   [place] Cannady, [name], NP   4 months ago Tinea corporis   [place], [place] T, NP   5 months ago Late onset Alzheimer's disease without behavioral disturbance ([place])   [place] Cannady, Jolene T, NP   8 months ago Late onset Alzheimer's disease without behavioral disturbance ([place])   [place] Cannady, [name], NP      Future Appointments            In 2 days Cannady, [name], NP [employer]   In 1 week [place], Tally Due, MD [place]

## 2019-11-05 ENCOUNTER — Encounter: Payer: Self-pay | Admitting: Nurse Practitioner

## 2019-11-05 ENCOUNTER — Other Ambulatory Visit: Payer: Self-pay

## 2019-11-05 ENCOUNTER — Ambulatory Visit (INDEPENDENT_AMBULATORY_CARE_PROVIDER_SITE_OTHER): Payer: Medicare Other | Admitting: Nurse Practitioner

## 2019-11-05 VITALS — BP 145/77 | HR 67 | Temp 97.9°F

## 2019-11-05 DIAGNOSIS — D649 Anemia, unspecified: Secondary | ICD-10-CM

## 2019-11-05 DIAGNOSIS — D519 Vitamin B12 deficiency anemia, unspecified: Secondary | ICD-10-CM | POA: Diagnosis not present

## 2019-11-05 NOTE — Assessment & Plan Note (Addendum)
Some improvement in symptoms.  Continue to collaborate with GI.  H/H last visit 10.3/33.0, MCV 79.9.  Patient does not want hospitalization or surgical intervention, would be poor surgical candidate with advanced age and memory + health issues.  Obtain iron, ferritin, TIBC, and CBC today.  Continue Carafate, Slow Fe, Prilosec. Awaiting stool guaiac.  Continue to discuss goals of care, as patient prefers no surgical interventions.  Return in 2 weeks.

## 2019-11-05 NOTE — Patient Instructions (Signed)
Anemia  Anemia is a condition in which you do not have enough red blood cells or hemoglobin. Hemoglobin is a substance in red blood cells that carries oxygen. When you do not have enough red blood cells or hemoglobin (are anemic), your body cannot get enough oxygen and your organs may not work properly. As a result, you may feel very tired or have other problems. What are the causes? Common causes of anemia include:  Excessive bleeding. Anemia can be caused by excessive bleeding inside or outside the body, including bleeding from the intestine or from periods in women.  Poor nutrition.  Long-lasting (chronic) kidney, thyroid, and liver disease.  Bone marrow disorders.  Cancer and treatments for cancer.  HIV (human immunodeficiency virus) and AIDS (acquired immunodeficiency syndrome).  Treatments for HIV and AIDS.  Spleen problems.  Blood disorders.  Infections, medicines, and autoimmune disorders that destroy red blood cells. What are the signs or symptoms? Symptoms of this condition include:  Minor weakness.  Dizziness.  Headache.  Feeling heartbeats that are irregular or faster than normal (palpitations).  Shortness of breath, especially with exercise.  Paleness.  Cold sensitivity.  Indigestion.  Nausea.  Difficulty sleeping.  Difficulty concentrating. Symptoms may occur suddenly or develop slowly. If your anemia is mild, you may not have symptoms. How is this diagnosed? This condition is diagnosed based on:  Blood tests.  Your medical history.  A physical exam.  Bone marrow biopsy. Your health care provider may also check your stool (feces) for blood and may do additional testing to look for the cause of your bleeding. You may also have other tests, including:  Imaging tests, such as a CT scan or MRI.  Endoscopy.  Colonoscopy. How is this treated? Treatment for this condition depends on the cause. If you continue to lose a lot of blood, you may  need to be treated at a hospital. Treatment may include:  Taking supplements of iron, vitamin S31, or folic acid.  Taking a hormone medicine (erythropoietin) that can help to stimulate red blood cell growth.  Having a blood transfusion. This may be needed if you lose a lot of blood.  Making changes to your diet.  Having surgery to remove your spleen. Follow these instructions at home:  Take over-the-counter and prescription medicines only as told by your health care provider.  Take supplements only as told by your health care provider.  Follow any diet instructions that you were given.  Keep all follow-up visits as told by your health care provider. This is important. Contact a health care provider if:  You develop new bleeding anywhere in the body. Get help right away if:  You are very weak.  You are short of breath.  You have pain in your abdomen or chest.  You are dizzy or feel faint.  You have trouble concentrating.  You have bloody or black, tarry stools.  You vomit repeatedly or you vomit up blood. Summary  Anemia is a condition in which you do not have enough red blood cells or enough of a substance in your red blood cells that carries oxygen (hemoglobin).  Symptoms may occur suddenly or develop slowly.  If your anemia is mild, you may not have symptoms.  This condition is diagnosed with blood tests as well as a medical history and physical exam. Other tests may be needed.  Treatment for this condition depends on the cause of the anemia. This information is not intended to replace advice given to you by  your health care provider. Make sure you discuss any questions you have with your health care provider. Document Revised: 07/27/2017 Document Reviewed: 09/15/2016 Elsevier Patient Education  [place].

## 2019-11-05 NOTE — Progress Notes (Signed)
BP (!) 145/77 (BP Location: Left Arm, Patient Position: Sitting, Cuff Size: Normal)   Pulse 67   Temp 97.9 F (36.6 C) (Oral)   LMP  (LMP Unknown)   SpO2 97%    Subjective:    Patient ID: Barbara Blackburn, female    DOB: [birth year], 86 y.o.   MRN: [id number]  HPI: Barbara Blackburn is a 86 y.o. female  Chief Complaint  Patient presents with  . Anemia   Daughter present to assist with HPI  ANEMIA Saw GI for initial visit on 10/30/2019 and was provided hydrocortisone suppository + Miralax as needed.  She is to follow-up with them in 2-3 weeks.  Recent labs noted iron 20, TIBC 354, H/H 10.3/33.9.  Her daughter reports there has been some more bleeding rectally, but not as bad as it was.  They gave her Miralax Monday and her daughter reports with that her stool was "very black" after that.  Had another black stool this morning, she is taking daily Slow Fe.  Is having regular bowel movements without straining.  No diarrhea.  She denies abdominal pain.    On review her last colonoscopy was 11/03/2008.  This noted diverticulosis (multiple small and large mouthed diverticula) of sigmoid colon, descending colon, and transverse colon + internal, non bleeding hemorrhoids.  Was to repeat in 3 years, but did not.  There was discussion of colonoscopy at recent GI appointment, but patient does not want this or hospitalization and daughter is unsure they would want to proceed with this.  They are aware this could help see where bleeding is coming from.  Patient's daughter wishes to speak to her sister further about this.  Patient wishes to be a DNR and there has been discussion recently about maintaining her current defib cardioverter due to progression of patient's dementia.  At this time continues on Slow Fe, Carafate, and Prilosec. Anemia status: stable Etiology of anemia: Duration of anemia treatment:  Compliance with treatment: good compliance Iron supplementation side effects: no Severity of  anemia: mild Fatigue: no Decreased exercise tolerance: yes  Dyspnea on exertion: at times Palpitations: no Bleeding: yes, although improved Pica: no  Relevant past medical, surgical, family and social history reviewed and updated as indicated. Interim medical history since our last visit reviewed. Allergies and medications reviewed and updated.  Review of Systems  Constitutional: Positive for fatigue. Negative for activity change, appetite change, diaphoresis and fever.  Respiratory: Positive for shortness of breath. Negative for cough, chest tightness and wheezing.   Cardiovascular: Negative for chest pain, palpitations and leg swelling.  Gastrointestinal: Positive for blood in stool. Negative for abdominal distention, abdominal pain, constipation, diarrhea, nausea, rectal pain and vomiting.  Endocrine: Positive for cold intolerance. Negative for heat intolerance.  Neurological: Positive for weakness. Negative for light-headedness and headaches.  Psychiatric/Behavioral: Negative.     Per HPI unless specifically indicated above     Objective:    BP (!) 145/77 (BP Location: Left Arm, Patient Position: Sitting, Cuff Size: Normal)   Pulse 67   Temp 97.9 F (36.6 C) (Oral)   LMP  (LMP Unknown)   SpO2 97%   Wt Readings from Last 3 Encounters:  10/30/19 175 lb 6 oz (79.5 kg)  10/29/19 175 lb 9.6 oz (79.7 kg)  10/14/19 177 lb 6 oz (80.5 kg)    Physical Exam Vitals and nursing note reviewed.  Constitutional:      General: She is awake. She is not in acute distress.  Appearance: She is well-developed and well-groomed. She is not ill-appearing.  HENT:     Head: Normocephalic.     Right Ear: Hearing normal.     Left Ear: Hearing normal.  Eyes:     General: Lids are normal.        Right eye: No discharge.        Left eye: No discharge.     Conjunctiva/sclera: Conjunctivae normal.     Pupils: Pupils are equal, round, and reactive to light.  Neck:     Vascular: No carotid  bruit.  Cardiovascular:     Rate and Rhythm: Normal rate and regular rhythm.     Heart sounds: Normal heart sounds. No murmur. No gallop.   Pulmonary:     Effort: Pulmonary effort is normal. No accessory muscle usage or respiratory distress.     Breath sounds: Normal breath sounds.  Abdominal:     General: Bowel sounds are normal.     Palpations: Abdomen is soft. There is no hepatomegaly or splenomegaly.     Tenderness: There is no abdominal tenderness.  Musculoskeletal:     Cervical back: Normal range of motion and neck supple.     Right lower leg: No edema.     Left lower leg: No edema.  Skin:    General: Skin is warm and dry.  Neurological:     Mental Status: She is alert.     Comments: Pleasantly confused.  Psychiatric:        Attention and Perception: Attention normal.        Mood and Affect: Mood normal.        Speech: Speech normal.        Behavior: Behavior normal. Behavior is cooperative.     Results for orders placed or performed in visit on 10/29/19  Lipid Panel w/o Chol/HDL Ratio  Result Value Ref Range   Cholesterol, Total 136 100 - 199 mg/dL   Triglycerides 226 (H) 0 - 149 mg/dL   HDL 59 >39 mg/dL   VLDL Cholesterol Cal 35 5 - 40 mg/dL   LDL Chol Calc (NIH) 42 0 - 99 mg/dL  CBC With Differential/Platelet  Result Value Ref Range   WBC 7.3 3.4 - 10.8 x10E3/uL   RBC 4.13 3.77 - 5.28 x10E6/uL   Hemoglobin 10.3 (L) 11.1 - 15.9 g/dL   Hematocrit 33.0 (L) 34.0 - 46.6 %   MCV 80 79 - 97 fL   MCH 24.9 (L) 26.6 - 33.0 pg   MCHC 31.2 (L) 31.5 - 35.7 g/dL   RDW 17.4 (H) 11.7 - 15.4 %   Platelets 252 150 - 450 x10E3/uL   Neutrophils 79 Not Estab. %   Lymphs 15 Not Estab. %   MID 6 Not Estab. %   Neutrophils Absolute 5.7 1.4 - 7.0 x10E3/uL   Lymphocytes Absolute 1.1 0.7 - 3.1 x10E3/uL   MID (Absolute) 0.5 0.1 - 1.6 X10E3/uL  Anemia panel  Result Value Ref Range   Total Iron Binding Capacity 354 250 - 450 ug/dL   UIBC 334 118 - 369 ug/dL   Iron 20 (L) 27 - 139  ug/dL   Iron Saturation 6 (LL) 15 - 55 %   Vitamin B-12 380 232 - 1,245 pg/mL   Folate, Hemolysate 366.0 Not Estab. ng/mL   Hematocrit 33.9 (L) 34.0 - 46.6 %   Folate, RBC 1,080 >498 ng/mL   Ferritin 37 15 - 150 ng/mL   Retic Ct Pct 1.5 0.6 - 2.6 %  VITAMIN D 25 Hydroxy (Vit-D Deficiency, Fractures)  Result Value Ref Range   Vit D, 25-Hydroxy 37.3 30.0 - 100.0 ng/mL  UA/M w/rflx Culture, Routine   Specimen: Urine   URINE  Result Value Ref Range   Specific Gravity, UA 1.020 1.005 - 1.030   pH, UA 7.0 5.0 - 7.5   Color, UA Yellow Yellow   Appearance Ur Cloudy (A) Clear   Leukocytes,UA Negative Negative   Protein,UA Negative Negative/Trace   Glucose, UA Negative Negative   Ketones, UA Negative Negative   RBC, UA Negative Negative   Bilirubin, UA Negative Negative   Urobilinogen, Ur 0.2 0.2 - 1.0 mg/dL   Nitrite, UA Negative Negative      Assessment & Plan:   Problem List Items Addressed This Visit      Other   Low hemoglobin - Primary    Some improvement in symptoms.  Continue to collaborate with GI.  H/H last visit 10.3/33.0, MCV 79.9.  Patient does not want hospitalization or surgical intervention, would be poor surgical candidate with advanced age and memory + health issues.  Obtain iron, ferritin, TIBC, and CBC today.  Continue Carafate, Slow Fe, Prilosec. Awaiting stool guaiac.  Continue to discuss goals of care, as patient prefers no surgical interventions.  Return in 2 weeks.      Relevant Orders   CBC with Differential/Platelet   Anemia panel       Follow up plan: Return in about 2 weeks (around 11/19/2019) for Anemia.

## 2019-11-06 LAB — CBC WITH DIFFERENTIAL/PLATELET
Basophils Absolute: 0.1 10*3/uL (ref 0.0–0.2)
Basos: 1 %
EOS (ABSOLUTE): 0.1 10*3/uL (ref 0.0–0.4)
Eos: 1 %
Hemoglobin: 10.9 g/dL — ABNORMAL LOW (ref 11.1–15.9)
Immature Grans (Abs): 0 10*3/uL (ref 0.0–0.1)
Immature Granulocytes: 1 %
Lymphocytes Absolute: 1.4 10*3/uL (ref 0.7–3.1)
Lymphs: 18 %
MCH: 24.3 pg — ABNORMAL LOW (ref 26.6–33.0)
MCHC: 30.8 g/dL — ABNORMAL LOW (ref 31.5–35.7)
MCV: 79 fL (ref 79–97)
Monocytes Absolute: 0.5 10*3/uL (ref 0.1–0.9)
Monocytes: 6 %
Neutrophils Absolute: 5.7 10*3/uL (ref 1.4–7.0)
Neutrophils: 73 %
Platelets: 263 10*3/uL (ref 150–450)
RBC: 4.49 x10E6/uL (ref 3.77–5.28)
RDW: 16.8 % — ABNORMAL HIGH (ref 11.7–15.4)
WBC: 7.7 10*3/uL (ref 3.4–10.8)

## 2019-11-06 LAB — ANEMIA PANEL
Ferritin: 41 ng/mL (ref 15–150)
Folate, Hemolysate: 456 ng/mL
Folate, RBC: 1288 ng/mL (ref >498)
Hematocrit: 35.4 % (ref 34.0–46.6)
Iron Saturation: 36 % (ref 15–55)
Iron: 131 ug/dL (ref 27–139)
Retic Ct Pct: 2.4 % (ref 0.6–2.6)
Total Iron Binding Capacity: 359 ug/dL (ref 250–450)
UIBC: 228 ug/dL (ref 118–369)
Vitamin B-12: 915 pg/mL (ref 232–1245)

## 2019-11-06 NOTE — Progress Notes (Signed)
Spoke to patient's daughter, DPR, on phone.  Anemia showing some improvement.  Iron level from 20 to now 131 and H/H trending upwards.

## 2019-11-07 ENCOUNTER — Telehealth: Payer: Self-pay

## 2019-11-07 ENCOUNTER — Other Ambulatory Visit: Payer: Self-pay | Admitting: Nurse Practitioner

## 2019-11-11 ENCOUNTER — Telehealth: Payer: Self-pay | Admitting: Gastroenterology

## 2019-11-11 ENCOUNTER — Ambulatory Visit (INDEPENDENT_AMBULATORY_CARE_PROVIDER_SITE_OTHER): Payer: Medicare Other | Admitting: General Practice

## 2019-11-11 ENCOUNTER — Telehealth: Payer: Self-pay | Admitting: Nurse Practitioner

## 2019-11-11 DIAGNOSIS — I4821 Permanent atrial fibrillation: Secondary | ICD-10-CM | POA: Diagnosis not present

## 2019-11-11 DIAGNOSIS — I10 Essential (primary) hypertension: Secondary | ICD-10-CM

## 2019-11-11 DIAGNOSIS — G301 Alzheimer's disease with late onset: Secondary | ICD-10-CM

## 2019-11-11 DIAGNOSIS — Z7189 Other specified counseling: Secondary | ICD-10-CM

## 2019-11-11 DIAGNOSIS — I5022 Chronic systolic (congestive) heart failure: Secondary | ICD-10-CM

## 2019-11-11 DIAGNOSIS — F0280 Dementia in other diseases classified elsewhere without behavioral disturbance: Secondary | ICD-10-CM

## 2019-11-11 NOTE — Telephone Encounter (Signed)
Pt daughter is calling to cancel pt Virtual apt she states pt is too high risk for a colonoscopy she did want to let  Dr. Marius Ditch know the suppository and Cream are working great for pt

## 2019-11-11 NOTE — Telephone Encounter (Signed)
LVM for Barbara Blackburn to return phone call or I would try her again tomorrow morning.

## 2019-11-11 NOTE — Telephone Encounter (Signed)
Please provide verbal order -- okay for orders

## 2019-11-11 NOTE — Chronic Care Management (AMB) (Signed)
Chronic Care Management   Follow Up Note   11/11/2019 Name: Barbara Blackburn MRN: [id number] DOB: 02/12/1929  Referred by: Venita Lick, NP Reason for referral : Chronic Care Management (RNCM Chronic Disease Management and Care Coordination needs: Hospice referral/life alert)   Barbara Blackburn is a 86 y.o. year old female who is a primary care patient of Cannady, [name], NP. The CCM team was consulted for assistance with chronic disease management and care coordination needs.    Review of patient status, including review of consultants reports, relevant laboratory and other test results, and collaboration with appropriate care team members and the patient's provider was performed as part of comprehensive patient evaluation and provision of chronic care management services.    SDOH (Social Determinants of Health) assessments performed: No See Care Plan activities for detailed interventions related to [facility])     Outpatient Encounter Medications as of 11/11/2019  Medication Sig Note  . busPIRone (BUSPAR) 5 MG tablet Take 5 mg by mouth daily as needed.  04/08/2019: Takes 2.5 mg if during day, 5 mg at night   . Cholecalciferol (VITAMIN D) 2000 units tablet Take 2,000 Units by mouth daily.   . furosemide (LASIX) 40 MG tablet Take 3 tablets (120 mg) by mouth once every other day in the morning 05/21/2019: Patient taking differently. Twice one tablet daily (80 mg total)  . galantamine (RAZADYNE) 4 MG tablet Take 1 tablet (4 mg total) by mouth 2 (two) times daily with a meal.   . hydrocortisone (ANUSOL-HC) 2.5 % rectal cream Place 1 application rectally 2 (two) times daily.   . hydrocortisone (ANUSOL-HC) 25 MG suppository Place 1 suppository (25 mg total) rectally 2 (two) times daily.   [name] losartan (COZAAR) 25 MG tablet Take 1 tablet (25 mg total) by mouth daily.   . Melatonin 10 MG TABS Take 1 tablet by mouth daily.   . metoprolol succinate (TOPROL-XL) 25 MG 24 hr tablet Take 1 tablet (25  mg total) by mouth daily.   [name] nystatin cream (MYCOSTATIN) Apply 1 application topically 2 (two) times daily.   [name] omeprazole (PRILOSEC) 20 MG capsule Take 20 mg by mouth daily as needed.   . rosuvastatin (CRESTOR) 10 MG tablet Take 1 tablet (10 mg total) by mouth daily.   . sertraline (ZOLOFT) 100 MG tablet Take 1 tablet (100 mg total) by mouth daily.   [name] spironolactone (ALDACTONE) 25 MG tablet Take 0.5 tablets (12.5 mg total) by mouth daily.   . sucralfate (CARAFATE) 1 g tablet Take 1 tablet (1 g total) by mouth 4 (four) times daily -  with meals and at bedtime.   Alveda Reasons 20 MG TABS tablet TAKE 1 TABLET BY MOUTH DAILY WITH SUPPER    No facility-administered encounter medications on file as of 11/11/2019.     Objective:   Goals Addressed            This Visit's Progress   . COMPLETED: Physical therepy needs (pt-stated)       Current Barriers:  [name] Knowledge Deficits related to obtaining a [place] PT   Nurse Case Manager Clinical Goal(s):  [name] Over the next 30  days, patient will work with [facility]  to address needs related to PT referral   Interventions:  . Discussed plans with patient for ongoing care management follow up and provided patient with direct contact information for care management team . Patient with Memory issues, daughter the main caregiver. Daughter does state she is  taking a small break this week end and recognizes the importance of caring for herself. - Daughter reports doing well at this time . Patient doing crafts and word searches to assist with memory.  . Patient has completed HH PT through [facility] and daughter reports this was very helpful. Daughter reports patient was able to obtain a steroid injection in her knee that is helpful for about a week. . Discussed with daughter and patient the importance of monitoring patient's weight related to hx of heart failure.   Patient Self Care Activities:  . Currently UNABLE TO independently obtain HH PT- Goal  Met  Please see past updates related to this goal by clicking on the "Past Updates" button in the selected goal      . RNCM: Pts daughter: "Hospice turned her down"       [place] (see longtitudinal plan of care for additional care plan information)  Current Barriers:  [name] Knowledge Deficits related to recent evaluation by Hospice and the organization turned the patient down for services. The daughters do not understand why this happened. Palliative care is working with the patient . Film/video editor.  . Cognitive Deficits  Nurse Case Manager Clinical Goal(s):  [name] Over the next 90 days, patient will verbalize understanding of plan for New Hospice referral through [facility] in [place], [place] . Over the next 90 days, patient will work with [place], RNCM and pcp to address needs related to in home care and assistance  . Over the next 90 days, patient will demonstrate a decrease in sundowning and falls exacerbations as evidenced by patients symptoms being effectively managed  Interventions:  . Advised patient to try to use activities like coloring sheets with colored pencils or crayons to help the patient during wandering phases.  The patients daughter states they are using puzzles to help distract her and have this set up for her even at night in case she gets up in the middle of the night. . Provided education to patient re: safety precautions to prevent falls.  The daughter is using non-skid socks.  The patient does not always use her walker when ambulating. The daughter reminds her frequently of using the walker. The patient is high fall risk. The daughter did check on the gps system but this only works up to 300 feet and the daughter does not feel this is beneficial for her mother at this time. They are using a double key dead bolt that has to have a key to open on both sides.  . Reviewed medications with patients daughter and discussed : The  daughter wanted to know if the patients glantamine could be increased. Secure chat sent to [name], NP. Jolene referred the patients daughter to neurology for recommendations. Nash Dimmer with [facility], Hospice Care Consultant regarding denied hospice admit last week. Melissa took down needed information and will have her staff assess and work with the patient. This is the [facility] located at [address] [place], [place], [place] [address].  Melissa can be reached at [phone]. Verbal order given by [name] for referral for hospice services.  . Discussed plans with patient for ongoing care management follow up and provided patient with direct contact information for care management team . Reviewed scheduled/upcoming provider appointments including: the patient was to have a visit with GI MD due to hemoglobin being low; however the family has decided it was not a good idea to  put the patient through this.   Patient Self Care Activities:  . Patient verbalizes understanding of plan to follow up with Hospice referral and other resources to help with advancing dementia/Alzheimer's  . Self administers medications as prescribed . Calls provider office for new concerns or questions . Unable to independently manage care in the home setting  . Unable to self administer medications as prescribed . Unable to perform ADLs independently . Unable to perform IADLs independently  Initial goal documentation     . RNCM: Pts daughter:"My niece is trying to do physical therpay with her but it is not workingProgrammer, systems ENTRY (see longtitudinal plan of care for additional care plan information)  Current Barriers:  . Chronic Disease Management support, education, and care coordination needs related to Atrial Fibrillation, CHF, HTN, and Dementia  Clinical Goal(s) related to Atrial Fibrillation, CHF, HTN, and Dementia:  Over the next 120 days, patient will:   . Work with the care management team to address educational, disease management, and care coordination needs  . Begin or continue self health monitoring activities as directed today Measure and record blood pressure 1 times per week and encourage activity as tolerated . Call provider office for new or worsened signs and symptoms Blood pressure findings outside established parameters and New or worsened symptom related to heart disease, dementia and new issues related to incontinence  . Call care management team with questions or concerns . Verbalize basic understanding of patient centered plan of care established today  Interventions related to Atrial Fibrillation, CHF, HTN, and Dementia:  . Evaluation of current treatment plans and patient's adherence to plan as established by provider . Assessed patient understanding of disease states . Assessed patient's education and care coordination needs.  The patient's daughter states her niece is trying to do physical therapy with the patient but the patient is so weak. Discussed concerns. Empathetic listening. The patients daughter feels her hemoglobin is low due to a bleed but feel the risk of involving GI versus the benefits would not be worth putting the patient through this. They want to keep the patient in her familiar environment where she feels safe.  . Provided disease specific education to patient  . Collaborated with appropriate clinical care team members regarding patient needs  Patient Self Care Activities related to Atrial Fibrillation, CHF, HTN, and Dementia:  . Patient is unable to independently self-manage chronic health conditions  Initial goal documentation         Plan:   The care management team will reach out to the patient again over the next 30 to 60  days.    Noreene Larsson RN, MSN, [facility] Mobile: [phone]

## 2019-11-11 NOTE — Patient Instructions (Signed)
Visit Information  Goals Addressed            This Visit's Progress   . COMPLETED: Physical therepy needs (pt-stated)       Current Barriers:  [name] Knowledge Deficits related to obtaining a [facility] PT   Nurse Case Manager Clinical Goal(s):  [name] Over the next 30  days, patient will work with [facility]  to address needs related to PT referral   Interventions:  . Discussed plans with patient for ongoing care management follow up and provided patient with direct contact information for care management team . Patient with Memory issues, daughter the main caregiver. Daughter does state she is taking a small break this week end and recognizes the importance of caring for herself. - Daughter reports doing well at this time . Patient doing crafts and word searches to assist with memory.  . Patient has completed HH PT through [facility] and daughter reports this was very helpful. Daughter reports patient was able to obtain a steroid injection in her knee that is helpful for about a week. . Discussed with daughter and patient the importance of monitoring patient's weight related to hx of heart failure.   Patient Self Care Activities:  . Currently UNABLE TO independently obtain HH PT- Goal Met  Please see past updates related to this goal by clicking on the "Past Updates" button in the selected goal      . RNCM: Pts daughter: "Hospice turned her down"       [place] (see longtitudinal plan of care for additional care plan information)  Current Barriers:  [name] Knowledge Deficits related to recent evaluation by Hospice and the organization turned the patient down for services. The daughters do not understand why this happened. Palliative care is working with the patient . Film/video editor.  . Cognitive Deficits  Nurse Case Manager Clinical Goal(s):  [name] Over the next 90 days, patient will verbalize understanding of plan for New Hospice referral through [facility] in  [place], [place] . Over the next 90 days, patient will work with [place], RNCM and pcp to address needs related to in home care and assistance  . Over the next 90 days, patient will demonstrate a decrease in sundowning and falls exacerbations as evidenced by patients symptoms being effectively managed  Interventions:  . Advised patient to try to use activities like coloring sheets with colored pencils or crayons to help the patient during wandering phases.  The patients daughter states they are using puzzles to help distract her and have this set up for her even at night in case she gets up in the middle of the night. . Provided education to patient re: safety precautions to prevent falls.  The daughter is using non-skid socks.  The patient does not always use her walker when ambulating. The daughter reminds her frequently of using the walker. The patient is high fall risk. The daughter did check on the gps system but this only works up to 300 feet and the daughter does not feel this is beneficial for her mother at this time. They are using a double key dead bolt that has to have a key to open on both sides.  . Reviewed medications with patients daughter and discussed : The daughter wanted to know if the patients glantamine could be increased. Secure chat sent to [name], NP. Jolene referred the patients daughter to neurology for recommendations. Nash Dimmer with [facility], Hospice Care Consultant  regarding denied hospice admit last week. Melissa took down needed information and will have her staff assess and work with the patient. This is the [facility] located at [address] [place], [place], [place] [address].  Melissa can be reached at [phone]. Verbal order given by [name] for referral for hospice services.  . Discussed plans with patient for ongoing care management follow up and provided patient with direct contact information for care  management team . Reviewed scheduled/upcoming provider appointments including: the patient was to have a visit with GI MD due to hemoglobin being low; however the family has decided it was not a good idea to put the patient through this.   Patient Self Care Activities:  . Patient verbalizes understanding of plan to follow up with Hospice referral and other resources to help with advancing dementia/Alzheimer's  . Self administers medications as prescribed . Calls provider office for new concerns or questions . Unable to independently manage care in the home setting  . Unable to self administer medications as prescribed . Unable to perform ADLs independently . Unable to perform IADLs independently  Initial goal documentation     . RNCM: Pts daughter:"My niece is trying to do physical therpay with her but it is not workingProgrammer, systems ENTRY (see longtitudinal plan of care for additional care plan information)  Current Barriers:  . Chronic Disease Management support, education, and care coordination needs related to Atrial Fibrillation, CHF, HTN, and Dementia  Clinical Goal(s) related to Atrial Fibrillation, CHF, HTN, and Dementia:  Over the next 120 days, patient will:  . Work with the care management team to address educational, disease management, and care coordination needs  . Begin or continue self health monitoring activities as directed today Measure and record blood pressure 1 times per week and encourage activity as tolerated . Call provider office for new or worsened signs and symptoms Blood pressure findings outside established parameters and New or worsened symptom related to heart disease, dementia and new issues related to incontinence  . Call care management team with questions or concerns . Verbalize basic understanding of patient centered plan of care established today  Interventions related to Atrial Fibrillation, CHF, HTN, and Dementia:  . Evaluation of current  treatment plans and patient's adherence to plan as established by provider . Assessed patient understanding of disease states . Assessed patient's education and care coordination needs.  The patient's daughter states her niece is trying to do physical therapy with the patient but the patient is so weak. Discussed concerns. Empathetic listening. The patients daughter feels her hemoglobin is low due to a bleed but feel the risk of involving GI versus the benefits would not be worth putting the patient through this. They want to keep the patient in her familiar environment where she feels safe.  . Provided disease specific education to patient  . Collaborated with appropriate clinical care team members regarding patient needs  Patient Self Care Activities related to Atrial Fibrillation, CHF, HTN, and Dementia:  . Patient is unable to independently self-manage chronic health conditions  Initial goal documentation        Patient verbalizes understanding of instructions provided today.   The care management team will reach out to the patient again over the next 30 to 60 days.   Noreene Larsson RN, MSN, [facility] Mobile: [phone]

## 2019-11-11 NOTE — Telephone Encounter (Signed)
Routing to provider. [place] for verbal orders?

## 2019-11-11 NOTE — Telephone Encounter (Signed)
Copied from [place] [phone]. Topic: General - Other >> Nov 11, 2019  3:59 PM Keene Breath wrote: Reason for CRM: Request verbal orders for eval and hospice care.  CB# [phone]

## 2019-11-12 ENCOUNTER — Telehealth: Payer: Self-pay | Admitting: Primary Care

## 2019-11-12 NOTE — Telephone Encounter (Signed)
Barbara Blackburn returned my call and was given verbal order per Jolene.

## 2019-11-12 NOTE — Telephone Encounter (Signed)
T/c from daughter that patient has been referred to [facility] hospice tomorrow. D/c from [place] service.

## 2019-11-13 ENCOUNTER — Ambulatory Visit: Payer: Medicare Other | Admitting: Gastroenterology

## 2019-11-13 ENCOUNTER — Other Ambulatory Visit: Payer: Medicare Other | Admitting: Primary Care

## 2019-11-17 ENCOUNTER — Ambulatory Visit (INDEPENDENT_AMBULATORY_CARE_PROVIDER_SITE_OTHER): Payer: Medicare Other

## 2019-11-17 DIAGNOSIS — Z95810 Presence of automatic (implantable) cardiac defibrillator: Secondary | ICD-10-CM

## 2019-11-17 DIAGNOSIS — I5022 Chronic systolic (congestive) heart failure: Secondary | ICD-10-CM | POA: Diagnosis not present

## 2019-11-19 ENCOUNTER — Other Ambulatory Visit: Payer: Self-pay

## 2019-11-19 ENCOUNTER — Encounter: Payer: Self-pay | Admitting: Nurse Practitioner

## 2019-11-19 ENCOUNTER — Ambulatory Visit (INDEPENDENT_AMBULATORY_CARE_PROVIDER_SITE_OTHER): Payer: Medicare Other | Admitting: Nurse Practitioner

## 2019-11-19 ENCOUNTER — Ambulatory Visit: Payer: Medicare Other | Admitting: Nurse Practitioner

## 2019-11-19 VITALS — BP 157/82 | HR 61 | Temp 97.8°F

## 2019-11-19 DIAGNOSIS — D649 Anemia, unspecified: Secondary | ICD-10-CM

## 2019-11-19 DIAGNOSIS — Z9181 History of falling: Secondary | ICD-10-CM | POA: Diagnosis not present

## 2019-11-19 DIAGNOSIS — G301 Alzheimer's disease with late onset: Secondary | ICD-10-CM | POA: Diagnosis not present

## 2019-11-19 DIAGNOSIS — F0280 Dementia in other diseases classified elsewhere without behavioral disturbance: Secondary | ICD-10-CM

## 2019-11-19 DIAGNOSIS — M25551 Pain in right hip: Secondary | ICD-10-CM | POA: Diagnosis not present

## 2019-11-19 NOTE — Assessment & Plan Note (Signed)
Acute, mild post fall.  Low suspicion for fx, but will obtain imaging to assess.  Discussed with patient and daughter that she may be poor candidate for major surgical intervention if fx present, but could have surgical consultation for possible procedure to provide comfort if needed.  Patient is adamant she does not want hospitalization or major surgery.  Recommend use of Tylenol at home, can provide on schedule basis 1000 MG twice to three times a day for comfort, which may also benefit arthritic knee.  Recommend bedside commode.  Return in 6 weeks, sooner if worsening pain.

## 2019-11-19 NOTE — Assessment & Plan Note (Signed)
Due to progressing dementia, often forgetting walker.  Recommend bedside commode, which hospice is obtaining.  Recommend labeling walker in bright colors to help with memory recall of use and monitoring patient closely at home.  Continue to collaborate with CCM team and hospice.  Patient does not want hospitalization or major surgery.

## 2019-11-19 NOTE — Assessment & Plan Note (Signed)
Chronic, progressive.  Continue current medication regimen and collaboration with neurology.  Continue Sertraline 100 MG and continue Buspar as needed for behaviors, may consider addition of Trazodone for sleep in future, however wish to keep medication to minimal to prevent falls. Recommend against using Tylenol PM due to Benadryl component and risk for falls, continue to use Melatonin only.  Collaborate with CCM team for caregiver support + collaborate with hospice services.  Return in 6 weeks.

## 2019-11-19 NOTE — Progress Notes (Signed)
BP (!) 157/82 (BP Location: Left Arm, Patient Position: Sitting, Cuff Size: Normal)   Pulse 61   Temp 97.8 F (36.6 C) (Oral)   LMP  (LMP Unknown)   SpO2 98%    Subjective:    Patient ID: Barbara Blackburn, female    DOB: [birth year], 86 y.o.   MRN: [id number]  HPI: Barbara Blackburn is a 86 y.o. female  Chief Complaint  Patient presents with  . Anemia  . Fall  . Hip Pain   Daughter present to assist with HPI  ANEMIA Saw GI for initial visit on 10/30/2019 and was provided hydrocortisone suppository + Miralax as needed.  Recent labs noted iron 131, TIBC , H/H 10.9/35.4, improving.  They did recently have hospice start in care of patient.  On review her last colonoscopy was 11/03/2008.  This noted diverticulosis (multiple small and large mouthed diverticula) of sigmoid colon, descending colon, and transverse colon + internal, non bleeding hemorrhoids.  Was to repeat in 3 years, but did not.  There was discussion of colonoscopy at recent GI appointment, but patient does not want this or hospitalization and daughter reports they do not plan to proceed with this.  They are aware this could help see where bleeding is coming from.  Patient wishes to be a DNR and there has been discussion recently about maintaining her current defib cardioverter due to progression of patient's dementia.  At this time continues on Slow Fe, Carafate, and Prilosec.  Her daughter reports bleeding has improved, but patient continues to feel fatigued. Anemia status: stable Etiology of anemia: Duration of anemia treatment:  Compliance with treatment: good compliance Iron supplementation side effects: no Severity of anemia: mild Fatigue: no Decreased exercise tolerance: yes  Dyspnea on exertion: at times Palpitations: no Bleeding: yes, although improved Pica: no   DEMENTIA + RECENT FALL: Chronic, progressive in nature. Saw neurology last 02/17/19, continues on Galantamine. The Namenda caused behaviors issues  so they stopped. Also continues prescribed Buspar as needed for anxiety and her daughter has been giving her 1/2 pill PRN as whole 5 MG is too strong, takes Sertraline daily for mood as well. Patient and daughter deny N&V, CP, dizziness, syncope, headache, or decreased appetite at this time. Hospice is now involved with patient.  Patient continues to eat well at home per her daughter report.Has had some weight loss over past year, but daughter weighs daily and appears to be stabilizing at this time, daughter has been providing her Ensure as instructed.   Had a recent fall on weekend, unsure how she fell, patient reports her knee gave out and when found her walker was on the other side of room.  Her daughter reports when she has fallen in past her walker has always not been near her, discussed this with her daughter and possible techniques to use to help patient recall to use walker, including having bedside commode which hospice is going to provide.  She does report some right hip discomfort, but has been walking on area without difficulty and her daughter reports bruising is to left side, none on right.  Her right side is also area of her arthritic knee.  Has history of fall last year with injury.She lives with daughter in apartment attached to house. Patient reports she would not want hospitalization or surgery if she ever sustained major injury.  They are giving Tylenol PM, but not using Tylenol during daytime. Involved hip: right  Mechanism of injury: fall Location: anterior Onset: sudden  Severity: mild  Quality: dull and aching Frequency: intermittent Radiation: no Aggravating factors: movement   Alleviating factors: NSAIDs  Status: stable Treatments attempted: ibuprofen   Relief with NSAIDs?: moderate Weakness with weight bearing: no Weakness with walking: no Paresthesias / decreased sensation: no Swelling: no Redness:no Fevers: no   Relevant past medical, surgical, family  and social history reviewed and updated as indicated. Interim medical history since our last visit reviewed. Allergies and medications reviewed and updated.  Review of Systems  Constitutional: Positive for fatigue. Negative for activity change, appetite change, diaphoresis and fever.  Respiratory: Negative for cough, chest tightness and wheezing.   Cardiovascular: Negative for chest pain, palpitations and leg swelling.  Gastrointestinal: Negative for abdominal distention, abdominal pain, blood in stool, constipation, diarrhea, nausea, rectal pain and vomiting.  Endocrine: Negative for cold intolerance and heat intolerance.  Musculoskeletal: Positive for arthralgias.  Neurological: Positive for weakness. Negative for light-headedness and headaches.  Psychiatric/Behavioral: Negative.     Per HPI unless specifically indicated above     Objective:    BP (!) 157/82 (BP Location: Left Arm, Patient Position: Sitting, Cuff Size: Normal)   Pulse 61   Temp 97.8 F (36.6 C) (Oral)   LMP  (LMP Unknown)   SpO2 98%   Wt Readings from Last 3 Encounters:  10/30/19 175 lb 6 oz (79.5 kg)  10/29/19 175 lb 9.6 oz (79.7 kg)  10/14/19 177 lb 6 oz (80.5 kg)    Physical Exam Vitals and nursing note reviewed.  Constitutional:      General: She is awake. She is not in acute distress.    Appearance: She is well-developed and well-groomed. She is not ill-appearing.  HENT:     Head: Normocephalic.     Right Ear: Hearing normal.     Left Ear: Hearing normal.  Eyes:     General: Lids are normal.        Right eye: No discharge.        Left eye: No discharge.     Conjunctiva/sclera: Conjunctivae normal.     Pupils: Pupils are equal, round, and reactive to light.  Neck:     Vascular: No carotid bruit.  Cardiovascular:     Rate and Rhythm: Normal rate and regular rhythm.     Heart sounds: Normal heart sounds. No murmur. No gallop.   Pulmonary:     Effort: Pulmonary effort is normal. No accessory  muscle usage or respiratory distress.     Breath sounds: Normal breath sounds.  Abdominal:     General: Bowel sounds are normal.     Palpations: Abdomen is soft. There is no hepatomegaly or splenomegaly.     Tenderness: There is no abdominal tenderness.  Musculoskeletal:     Cervical back: Normal range of motion and neck supple.     Right hip: Tenderness (mild to palpation of anterior aspect) present. No deformity, lacerations, bony tenderness or crepitus. Normal range of motion.     Left hip: Normal.     Right lower leg: No edema.     Left lower leg: No edema.  Skin:    General: Skin is warm and dry.     Comments: Approx 3 cm area of bruising to upper left gluteus, pale yellow/purple, with no tenderness.  Approx 5 cm area of bruising to right upper hip area, pale purple/yellow, with no tenderness.  No bruising or rashes noted to right side.    Neurological:     Mental Status: She is alert.  Comments: Pleasantly confused.  Psychiatric:        Attention and Perception: Attention normal.        Mood and Affect: Mood normal.        Speech: Speech normal.        Behavior: Behavior normal. Behavior is cooperative.    Hip Exam: bilateral     Tenderness to palpation: mild to anterior right hip     Greater trochanter: no      Anterior superior iliac spine: no     Anterior hip: mild to anterior right hip    Iliac crest: no     Iliac tubercle: no     Pubic tubercle: no     SI joint: no      Range of Motion: Full ROM per patient baseline, passive ROM, does have decreased ROM right knee and is slow to change positions    Flexion: Normal    Extension: Normal    Abduction: Normal    Adduction: normal    Internal rotation: Normal    External rotation: Normal     Muscle Strength:  3/5 bilaterally per baseline     Results for orders placed or performed in visit on 11/05/19  CBC with Differential/Platelet  Result Value Ref Range   WBC 7.7 3.4 - 10.8 x10E3/uL   RBC 4.49 3.77 - 5.28  x10E6/uL   Hemoglobin 10.9 (L) 11.1 - 15.9 g/dL   MCV 79 79 - 97 fL   MCH 24.3 (L) 26.6 - 33.0 pg   MCHC 30.8 (L) 31.5 - 35.7 g/dL   RDW 16.8 (H) 11.7 - 15.4 %   Platelets 263 150 - 450 x10E3/uL   Neutrophils 73 Not Estab. %   Lymphs 18 Not Estab. %   Monocytes 6 Not Estab. %   Eos 1 Not Estab. %   Basos 1 Not Estab. %   Neutrophils Absolute 5.7 1.4 - 7.0 x10E3/uL   Lymphocytes Absolute 1.4 0.7 - 3.1 x10E3/uL   Monocytes Absolute 0.5 0.1 - 0.9 x10E3/uL   EOS (ABSOLUTE) 0.1 0.0 - 0.4 x10E3/uL   Basophils Absolute 0.1 0.0 - 0.2 x10E3/uL   Immature Granulocytes 1 Not Estab. %   Immature Grans (Abs) 0.0 0.0 - 0.1 x10E3/uL  Anemia panel  Result Value Ref Range   Total Iron Binding Capacity 359 250 - 450 ug/dL   UIBC 228 118 - 369 ug/dL   Iron 131 27 - 139 ug/dL   Iron Saturation 36 15 - 55 %   Vitamin B-12 915 232 - 1,245 pg/mL   Folate, Hemolysate 456.0 Not Estab. ng/mL   Hematocrit 35.4 34.0 - 46.6 %   Folate, RBC 1,288 >498 ng/mL   Ferritin 41 15 - 150 ng/mL   Retic Ct Pct 2.4 0.6 - 2.6 %      Assessment & Plan:   Problem List Items Addressed This Visit      Nervous and Auditory   Dementia (HCC) - Primary    Chronic, progressive.  Continue current medication regimen and collaboration with neurology.  Continue Sertraline 100 MG and continue Buspar as needed for behaviors, may consider addition of Trazodone for sleep in future, however wish to keep medication to minimal to prevent falls. Recommend against using Tylenol PM due to Benadryl component and risk for falls, continue to use Melatonin only.  Collaborate with CCM team for caregiver support + collaborate with hospice services.  Return in 6 weeks.        Other  Low hemoglobin    Some improvement in symptoms, however remains fatigued.  Continue to collaborate with GI.  H/H last visit showing some improvement, as did iron.  Patient does not want hospitalization or surgical intervention, would be poor surgical candidate  with advanced age and memory + health issues.  Obtain iron, ferritin, TIBC, and CBC today.  Continue Carafate, Slow Fe, Prilosec. Awaiting stool guaiac.  Continue to discuss goals of care, as patient prefers no surgical interventions.  Continue collaboration with hospice services.  Return in 6 weeks, sooner if worsening.        Relevant Orders   Iron, TIBC and Ferritin Panel   CBC with Differential/Platelet   Right hip pain    Acute, mild post fall.  Low suspicion for fx, but will obtain imaging to assess.  Discussed with patient and daughter that she may be poor candidate for major surgical intervention if fx present, but could have surgical consultation for possible procedure to provide comfort if needed.  Patient is adamant she does not want hospitalization or major surgery.  Recommend use of Tylenol at home, can provide on schedule basis 1000 MG twice to three times a day for comfort, which may also benefit arthritic knee.  Recommend bedside commode.  Return in 6 weeks, sooner if worsening pain.      Relevant Orders   DG Hip Unilat W OR W/O Pelvis 2-3 Views Right   At high risk for falls    Due to progressing dementia, often forgetting walker.  Recommend bedside commode, which hospice is obtaining.  Recommend labeling walker in bright colors to help with memory recall of use and monitoring patient closely at home.  Continue to collaborate with CCM team and hospice.  Patient does not want hospitalization or major surgery.         Time: 25 minutes, >50% spent counseling and educating   Follow up plan: Return in about 6 weeks (around 12/31/2019) for Dementia and anemia.

## 2019-11-19 NOTE — Patient Instructions (Signed)
Anemia  Anemia is a condition in which you do not have enough red blood cells or hemoglobin. Hemoglobin is a substance in red blood cells that carries oxygen. When you do not have enough red blood cells or hemoglobin (are anemic), your body cannot get enough oxygen and your organs may not work properly. As a result, you may feel very tired or have other problems. What are the causes? Common causes of anemia include:  Excessive bleeding. Anemia can be caused by excessive bleeding inside or outside the body, including bleeding from the intestine or from periods in women.  Poor nutrition.  Long-lasting (chronic) kidney, thyroid, and liver disease.  Bone marrow disorders.  Cancer and treatments for cancer.  HIV (human immunodeficiency virus) and AIDS (acquired immunodeficiency syndrome).  Treatments for HIV and AIDS.  Spleen problems.  Blood disorders.  Infections, medicines, and autoimmune disorders that destroy red blood cells. What are the signs or symptoms? Symptoms of this condition include:  Minor weakness.  Dizziness.  Headache.  Feeling heartbeats that are irregular or faster than normal (palpitations).  Shortness of breath, especially with exercise.  Paleness.  Cold sensitivity.  Indigestion.  Nausea.  Difficulty sleeping.  Difficulty concentrating. Symptoms may occur suddenly or develop slowly. If your anemia is mild, you may not have symptoms. How is this diagnosed? This condition is diagnosed based on:  Blood tests.  Your medical history.  A physical exam.  Bone marrow biopsy. Your health care provider may also check your stool (feces) for blood and may do additional testing to look for the cause of your bleeding. You may also have other tests, including:  Imaging tests, such as a CT scan or MRI.  Endoscopy.  Colonoscopy. How is this treated? Treatment for this condition depends on the cause. If you continue to lose a lot of blood, you may  need to be treated at a hospital. Treatment may include:  Taking supplements of iron, vitamin S31, or folic acid.  Taking a hormone medicine (erythropoietin) that can help to stimulate red blood cell growth.  Having a blood transfusion. This may be needed if you lose a lot of blood.  Making changes to your diet.  Having surgery to remove your spleen. Follow these instructions at home:  Take over-the-counter and prescription medicines only as told by your health care provider.  Take supplements only as told by your health care provider.  Follow any diet instructions that you were given.  Keep all follow-up visits as told by your health care provider. This is important. Contact a health care provider if:  You develop new bleeding anywhere in the body. Get help right away if:  You are very weak.  You are short of breath.  You have pain in your abdomen or chest.  You are dizzy or feel faint.  You have trouble concentrating.  You have bloody or black, tarry stools.  You vomit repeatedly or you vomit up blood. Summary  Anemia is a condition in which you do not have enough red blood cells or enough of a substance in your red blood cells that carries oxygen (hemoglobin).  Symptoms may occur suddenly or develop slowly.  If your anemia is mild, you may not have symptoms.  This condition is diagnosed with blood tests as well as a medical history and physical exam. Other tests may be needed.  Treatment for this condition depends on the cause of the anemia. This information is not intended to replace advice given to you by  your health care provider. Make sure you discuss any questions you have with your health care provider. Document Revised: 07/27/2017 Document Reviewed: 09/15/2016 Elsevier Patient Education  [place].

## 2019-11-19 NOTE — Assessment & Plan Note (Signed)
Some improvement in symptoms, however remains fatigued.  Continue to collaborate with GI.  H/H last visit showing some improvement, as did iron.  Patient does not want hospitalization or surgical intervention, would be poor surgical candidate with advanced age and memory + health issues.  Obtain iron, ferritin, TIBC, and CBC today.  Continue Carafate, Slow Fe, Prilosec. Awaiting stool guaiac.  Continue to discuss goals of care, as patient prefers no surgical interventions.  Continue collaboration with hospice services.  Return in 6 weeks, sooner if worsening.

## 2019-11-19 NOTE — Progress Notes (Signed)
EPIC Encounter for ICM Monitoring  Patient Name: Barbara Blackburn is a 86 y.o. female Date: 11/19/2019 Primary Care Physican: Venita Lick, NP Primary Cardiologist:Gollan Electrophysiologist:Klein 2/17/2021Weight: [id number]    Spoke with daughter Barbara Blackburn. Patient is doing well and no fluid symptoms. She reports she is in now in hospice care at home.  OptivolThoracic impedancenormal.  Prescribed: Furosemide40 mgTake 3 tablets (120 mg) by mouth once every other day in the morning.   Labs: 10/14/2019 Creatinine 0.96, BUN 26, Potassium 4.2, Sodium 145, GFR 53-61 01/16/2019 Creatinine 0.89, BUN 20, Potassium 4.2, Sodium 141, GFR 58-67 11/07/2018 Creatinine0.78, BUN23, Potassium3.9, [id number], [id number] A complete set of results can be found in Results Review.  Recommendations:Encouraged to call if experiencing fluid symptoms.  Follow-up plan: ICM clinic phone appointment on4/28/2021.  91 day device clinic remote transmission4/27/2021.   Copy of ICM check sent to [place].   3 month ICM trend: 11/17/2019    1 Year ICM trend:      Barbara Billings, RN 11/19/2019 4:34 PM

## 2019-11-20 LAB — CBC WITH DIFFERENTIAL/PLATELET
Basophils Absolute: 0.1 10*3/uL (ref 0.0–0.2)
Basos: 1 %
EOS (ABSOLUTE): 0.1 10*3/uL (ref 0.0–0.4)
Eos: 1 %
Hematocrit: 38.1 % (ref 34.0–46.6)
Hemoglobin: 11.6 g/dL (ref 11.1–15.9)
Immature Grans (Abs): 0 10*3/uL (ref 0.0–0.1)
Immature Granulocytes: 0 %
Lymphocytes Absolute: 1 10*3/uL (ref 0.7–3.1)
Lymphs: 15 %
MCH: 24.8 pg — ABNORMAL LOW (ref 26.6–33.0)
MCHC: 30.4 g/dL — ABNORMAL LOW (ref 31.5–35.7)
MCV: 81 fL (ref 79–97)
Monocytes Absolute: 0.4 10*3/uL (ref 0.1–0.9)
Monocytes: 6 %
Neutrophils Absolute: 5 10*3/uL (ref 1.4–7.0)
Neutrophils: 77 %
Platelets: 189 10*3/uL (ref 150–450)
RBC: 4.68 x10E6/uL (ref 3.77–5.28)
RDW: 21 % — ABNORMAL HIGH (ref 11.7–15.4)
WBC: 6.6 10*3/uL (ref 3.4–10.8)

## 2019-11-20 LAB — IRON,TIBC AND FERRITIN PANEL
Ferritin: 93 ng/mL (ref 15–150)
Iron Saturation: 15 % (ref 15–55)
Iron: 46 ug/dL (ref 27–139)
Total Iron Binding Capacity: 306 ug/dL (ref 250–450)
UIBC: 260 ug/dL (ref 118–369)

## 2019-11-20 NOTE — Progress Notes (Signed)
Contacted via MyChart

## 2019-11-21 ENCOUNTER — Encounter: Payer: Self-pay | Admitting: Nurse Practitioner

## 2019-11-26 ENCOUNTER — Telehealth: Payer: Self-pay

## 2019-11-27 ENCOUNTER — Other Ambulatory Visit: Payer: Self-pay | Admitting: Nurse Practitioner

## 2019-12-03 ENCOUNTER — Ambulatory Visit (INDEPENDENT_AMBULATORY_CARE_PROVIDER_SITE_OTHER): Payer: Medicare Other | Admitting: Licensed Clinical Social Worker

## 2019-12-03 DIAGNOSIS — I482 Chronic atrial fibrillation, unspecified: Secondary | ICD-10-CM | POA: Diagnosis not present

## 2019-12-03 DIAGNOSIS — M1711 Unilateral primary osteoarthritis, right knee: Secondary | ICD-10-CM | POA: Diagnosis not present

## 2019-12-03 DIAGNOSIS — G301 Alzheimer's disease with late onset: Secondary | ICD-10-CM

## 2019-12-03 DIAGNOSIS — F320 Major depressive disorder, single episode, mild: Secondary | ICD-10-CM

## 2019-12-03 DIAGNOSIS — I5022 Chronic systolic (congestive) heart failure: Secondary | ICD-10-CM | POA: Diagnosis not present

## 2019-12-03 DIAGNOSIS — I10 Essential (primary) hypertension: Secondary | ICD-10-CM | POA: Diagnosis not present

## 2019-12-03 DIAGNOSIS — F0280 Dementia in other diseases classified elsewhere without behavioral disturbance: Secondary | ICD-10-CM

## 2019-12-03 NOTE — Chronic Care Management (AMB) (Signed)
Chronic Care Management    Clinical Social Work Follow Up Note  12/03/2019 Name: NEKOLE KAMINSKY MRN: [id number] DOB: 06/28/1929  NYAJA BARREAU is a 87 y.o. year old female who is a primary care patient of Cannady, [name], NP. The CCM team was consulted for assistance with Level of Care Concerns and Hospice/Palliative Care Services Education.   Review of patient status, including review of consultants reports, other relevant assessments, and collaboration with appropriate care team members and the patient's provider was performed as part of comprehensive patient evaluation and provision of chronic care management services.    SDOH (Social Determinants of Health) assessments performed: Yes    Outpatient Encounter Medications as of 12/03/2019  Medication Sig Note  . busPIRone (BUSPAR) 5 MG tablet Take 5 mg by mouth daily as needed.  04/08/2019: Takes 2.5 mg if during day, 5 mg at night   . Cholecalciferol (VITAMIN D) 2000 units tablet Take 2,000 Units by mouth daily.   . diclofenac Sodium (VOLTAREN) 1 % GEL Voltaren Arthritis Pain topical RKNEE  PRN PAIN   . furosemide (LASIX) 40 MG tablet Take 3 tablets (120 mg) by mouth once every other day in the morning 05/21/2019: Patient taking differently. Twice one tablet daily (80 mg total)  . galantamine (RAZADYNE) 4 MG tablet Take 1 tablet (4 mg total) by mouth 2 (two) times daily with a meal.   . losartan (COZAAR) 25 MG tablet Take 1 tablet (25 mg total) by mouth daily.   . Melatonin 10 MG TABS Take 0.5 tablets by mouth daily.    . metoprolol succinate (TOPROL-XL) 25 MG 24 hr tablet Take 1 tablet (25 mg total) by mouth daily.   [name] nystatin cream (MYCOSTATIN) Apply 1 application topically 2 (two) times daily.   [name] omeprazole (PRILOSEC) 20 MG capsule Take 20 mg by mouth daily as needed.   . rosuvastatin (CRESTOR) 10 MG tablet Take 1 tablet (10 mg total) by mouth daily.   . sertraline (ZOLOFT) 100 MG tablet Take 1 tablet (100 mg total) by mouth  daily.   [name] spironolactone (ALDACTONE) 25 MG tablet Take 0.5 tablets (12.5 mg total) by mouth daily.   . sucralfate (CARAFATE) 1 g tablet Take 1 tablet (1 g total) by mouth 4 (four) times daily -  with meals and at bedtime.   . vitamin B-12 (CYANOCOBALAMIN) 500 MCG tablet Take 500 mcg by mouth daily.   Alveda Reasons 20 MG TABS tablet TAKE 1 TABLET BY MOUTH DAILY WITH SUPPER    No facility-administered encounter medications on file as of 12/03/2019.     Goals Addressed    . SW"Mother needs more socialization/activity to help with her dementia/mobility" (pt-stated)       Current Barriers:  . Limited social support . ADL IADL limitations . Social Isolation . Memory Deficits . Inability to perform ADL's independently . Inability to perform IADL's independently  Clinical Social Work Clinical Goal(s):  [name] Over the next 90 days, client will work with SW to address concerns related to gaining additional support/resources   Interventions: . Patient interviewed and appropriate assessments performed . Provided patient with information about available socialization resources within the area. Dementia support resource education provided as well.  . Discussed plans with patient for ongoing care management follow up and provided patient with direct contact information for care management team . Advised patient to contact CCM team for any urgent concerns . Assisted patient/caregiver with obtaining information about health plan benefits . Provided education and  assistance to client regarding Advanced Directives. . Provided education to patient/caregiver regarding level of care options. [name] LCSW provided education on Medicaid enrollment and the LTC placement process. Family prefer to keep patient in the home for now but admit to caregiver burnout/stress. Hospice is now involved which has helped the family. Daughter reports that she is even able to take time off from care giving and is going on a vacation this  weekend.  [name] LCSW provided education on available personal care service resources within the area.  . Patient's daughter reports that patient is still very weak. Patient was transitioned from palliative care to hospice care in March of 2021. Family reports that her heart disease gained her approval into Hospice. Family deny any further bleeding of the rectum.   . Family received a shower chair, bedside commode, oxygen machine and nebulizer from Hospice. Patient is also receiving depends through program as well.  . Daughter reports that family has decided to discontinue Galantamine medication as they have not seen any improvement with use and medication is very expensive.  [name] Hospice aide comes 2x  per week and nurse 1x per week. Family is still paying out of pocket for a caregiver to come every Friday for 6 hours.   Patient Self Care Activities:  . Attends all scheduled provider appointments . Calls provider office for new concerns or questions  Please see past updates related to this goal by clicking on the "Past Updates" button in the selected goal      Follow Up Plan: SW will follow up with patient by phone over the next quarter  Eula Fried, [place], MSW, [email or web address] Phone: [phone]

## 2019-12-19 ENCOUNTER — Telehealth: Payer: Self-pay | Admitting: General Practice

## 2019-12-19 ENCOUNTER — Ambulatory Visit: Payer: Self-pay | Admitting: General Practice

## 2019-12-19 ENCOUNTER — Other Ambulatory Visit: Payer: Self-pay | Admitting: Nurse Practitioner

## 2019-12-19 DIAGNOSIS — I482 Chronic atrial fibrillation, unspecified: Secondary | ICD-10-CM

## 2019-12-19 DIAGNOSIS — I10 Essential (primary) hypertension: Secondary | ICD-10-CM

## 2019-12-19 DIAGNOSIS — Z9181 History of falling: Secondary | ICD-10-CM

## 2019-12-19 DIAGNOSIS — I5022 Chronic systolic (congestive) heart failure: Secondary | ICD-10-CM

## 2019-12-19 DIAGNOSIS — F0280 Dementia in other diseases classified elsewhere without behavioral disturbance: Secondary | ICD-10-CM

## 2019-12-19 DIAGNOSIS — G301 Alzheimer's disease with late onset: Secondary | ICD-10-CM

## 2019-12-19 NOTE — Chronic Care Management (AMB) (Signed)
Chronic Care Management   Follow Up Note   12/19/2019 Name: Barbara Blackburn MRN: [id number] DOB: 1928/09/27  Referred by: Venita Lick, NP Reason for referral : Chronic Care Management (Chronic Conditions: HF/HTN/Dementia- care coordination )   Barbara Blackburn is a 86 y.o. year old female who is a primary care patient of Cannady, [name], NP. The CCM team was consulted for assistance with chronic disease management and care coordination needs.    Review of patient status, including review of consultants reports, relevant laboratory and other test results, and collaboration with appropriate care team members and the patient's provider was performed as part of comprehensive patient evaluation and provision of chronic care management services.    SDOH (Social Determinants of Health) assessments performed: Yes See Care Plan activities for detailed interventions related to [facility])     Outpatient Encounter Medications as of 12/19/2019  Medication Sig Note  . busPIRone (BUSPAR) 5 MG tablet Take 5 mg by mouth daily as needed.  04/08/2019: Takes 2.5 mg if during day, 5 mg at night   . Cholecalciferol (VITAMIN D) 2000 units tablet Take 2,000 Units by mouth daily.   . diclofenac Sodium (VOLTAREN) 1 % GEL Voltaren Arthritis Pain topical RKNEE  PRN PAIN   . furosemide (LASIX) 40 MG tablet Take 3 tablets (120 mg) by mouth once every other day in the morning 05/21/2019: Patient taking differently. Twice one tablet daily (80 mg total)  . galantamine (RAZADYNE) 4 MG tablet Take 1 tablet (4 mg total) by mouth 2 (two) times daily with a meal.   . losartan (COZAAR) 25 MG tablet Take 1 tablet (25 mg total) by mouth daily.   . Melatonin 10 MG TABS Take 0.5 tablets by mouth daily.    . metoprolol succinate (TOPROL-XL) 25 MG 24 hr tablet Take 1 tablet (25 mg total) by mouth daily.   [name] nystatin cream (MYCOSTATIN) Apply 1 application topically 2 (two) times daily.   [name] omeprazole (PRILOSEC) 20 MG capsule  Take 20 mg by mouth daily as needed.   . rosuvastatin (CRESTOR) 10 MG tablet Take 1 tablet (10 mg total) by mouth daily.   . sertraline (ZOLOFT) 100 MG tablet Take 1 tablet (100 mg total) by mouth daily.   [name] spironolactone (ALDACTONE) 25 MG tablet Take 0.5 tablets (12.5 mg total) by mouth daily.   . sucralfate (CARAFATE) 1 g tablet Take 1 tablet (1 g total) by mouth 4 (four) times daily -  with meals and at bedtime.   . vitamin B-12 (CYANOCOBALAMIN) 500 MCG tablet Take 500 mcg by mouth daily.   Alveda Reasons 20 MG TABS tablet TAKE 1 TABLET BY MOUTH DAILY WITH SUPPER    No facility-administered encounter medications on file as of 12/19/2019.     Objective:  BP Readings from Last 3 Encounters:  12/19/19 (!) 111/55  11/19/19 (!) 157/82  11/05/19 (!) 145/77   Had an earlier blood pressure: 78/48- possible dehydration. Before the end of the call the blood pressure was 111/55.  Goals Addressed            This Visit's Progress   . RNCM: Pts daughter: "Hospice has been a big help for [place]- thank you" (pt-stated)       [facility] (see longtitudinal plan of care for additional care plan information)  Current Barriers:  [name] Knowledge Deficits related to recent evaluation by Hospice and the new Hospice organization is working with the patient.  The daughter is very thankful  for this.  She is still uncertain of everything they can do but is thankful for the support.  . Film/video editor.  . Cognitive Deficits  Nurse Case Manager Clinical Goal(s):  [name] Over the next 90 days, patient will verbalize understanding of plan for New Hospice referral through [facility] in [place], [place]- Completed. The patient has been accepted and Hospice is actively working with the patient and family. Goal completed . Over the next 120 days, patient will work with [place], RNCM and pcp to address needs related to in home care and assistance  . Over the next 120 days, patient will  demonstrate a decrease in sundowning and falls exacerbations as evidenced by patients symptoms being effectively managed  Interventions:  . Advised patient to try to use activities like coloring sheets with colored pencils or crayons to help the patient during wandering phases.  The patients daughter states they are using puzzles to help distract her and have this set up for her even at night in case she gets up in the middle of the night. . Provided education to patient re: safety precautions to prevent falls.  The daughter is using non-skid socks.  The patient does not always use her walker when ambulating. The daughter reminds her frequently of using the walker. The patient is high fall risk. The daughter did check on the gps system but this only works up to 300 feet and the daughter does not feel this is beneficial for her mother at this time. They are using a double key dead bolt that has to have a key to open on both sides. They have stopped using the double key dead bolt because this was making the patient very upset and not working. The patient now can access the other parts of the house but per the daughter she feels the dementia and sun downing are getting worse.  . Reviewed medications with patients daughter and discussed : The daughter wanted to know if the patients glantamine could be increased. Secure chat sent to [name], NP. Jolene referred the patients daughter to neurology for recommendations. This medication has been stopped. The daughter feels like it was not working and it was expensive. She talked to the Hospice nurse about it and there was no indication that it was helping the patient.  The daughter made the decision to stop this medication.  Nash Dimmer with Roselyn Meier, Hospice Care Consultant regarding denied hospice admit last week. Melissa took down needed information and will have her staff assess and work with the patient. This is the [facility]  located at [address], [place], [place], [place] [address].  Melissa can be reached at [phone]. Verbal order given by [name] for referral for hospice services. Completed.  The patients daughter is very thankful for Hospice coming in and serving the patient and the family.  . Discussed plans with patient for ongoing care management follow up and provided patient with direct contact information for care management team . Reviewed scheduled/upcoming provider appointments including: the patient was to have a visit with GI MD due to hemoglobin being low; however the family has decided it was not a good idea to put the patient through this.  . Review of current concerns. The patients daughter states that the patient had a bad night.  The patient's dementia is worse and the sundowning is worse. The patient often is going to sleep around 4 pm and sleeping a  couple of hours and thinks she has been asleep all night. The patient's daughter states the patients blood pressure was 78/48 this am with a pulse of 138.  Advised the patients daughter that she was likely dehydrated. Education on pinching skin on the back of the hand to see if the skin tents. The daughter was going to do this. They have gotten the patient to drink and eat and the blood pressure is now 111/55 and HR 77. Education on low blood pressure and the increased risk of light headedness, dizziness, and falls.   Secure chat message sent to [facility] to provide current information and update.  Advised the daughter to call Hospice and have a nurse come out to evaluate the patient. The daughter verbalized understanding.  . Evaluation of medication compliance.  The daughter is having a hard time getting the patient to take medications at times. Ask the daughter to try to put the pills in applesauce and see if the patient would take the medications that way. The daughter is going to try this when she can not get her to take the  pills. . Evaluation of nutritional status. The patient is not eating or drinking a lot. Ask about Ensure supplements. The daughter has not tried this. Will supply Ensure samples of strawberry and vanilla at the next MD visit on 12-31-2019.   Patient Self Care Activities:  . Patient verbalizes understanding of plan to follow up with Hospice referral and other resources to help with advancing dementia/Alzheimer's  . Self administers medications as prescribed . Calls provider office for new concerns or questions . Unable to independently manage care in the home setting  . Unable to self administer medications as prescribed . Unable to perform ADLs independently . Unable to perform IADLs independently  Please see past updates related to this goal by clicking on the "Past Updates" button in the selected goal      . RNCM: Pts daughter:"My niece is trying to do physical therpay with her but it is not working"       [place] (see longtitudinal plan of care for additional care plan information)  Current Barriers:  . Chronic Disease Management support, education, and care coordination needs related to Atrial Fibrillation, CHF, HTN, and Dementia  Clinical Goal(s) related to Atrial Fibrillation, CHF, HTN, and Dementia:  Over the next 120 days, patient will:  . Work with the care management team to address educational, disease management, and care coordination needs  . Begin or continue self health monitoring activities as directed today Measure and record blood pressure 1 times per week and encourage activity as tolerated . Call provider office for new or worsened signs and symptoms Blood pressure findings outside established parameters and New or worsened symptom related to heart disease, dementia and new issues related to incontinence  . Call care management team with questions or concerns . Verbalize basic understanding of patient centered plan of care established today  Interventions  related to Atrial Fibrillation, CHF, HTN, and Dementia:  . Evaluation of current treatment plans and patient's adherence to plan as established by provider . Assessed patient understanding of disease states . Assessed patient's education and care coordination needs.  The patient's daughter states her niece is trying to do physical therapy with the patient but the patient is so weak. Discussed concerns. Empathetic listening. The patients daughter feels her hemoglobin is low due to a bleed but feel the risk of involving GI versus the benefits would not be  worth putting the patient through this. They want to keep the patient in her familiar environment where she feels safe.  . Provided disease specific education to patient.  Education on the signs and symptoms of worsening dementia. The patient's daughter feels the patients dementia and sun downers is getting worse. The patients daughter states the patient had an "Extreme bowel issue" last night and there was stool "everywhere".  The daughter says she is going to call Hospice and she also has her caregiver there this am. The patient's blood pressure has been low and they have been getting her to drink water and the blood pressure has improved. Discussed safety concerns with the patients daughter.  Nash Dimmer with appropriate clinical care team members regarding patient needs  Patient Self Care Activities related to Atrial Fibrillation, CHF, HTN, and Dementia:  . Patient is unable to independently self-manage chronic health conditions  Please see past updates related to this goal by clicking on the "Past Updates" button in the selected goal          Plan:   The care management team will reach out to the patient again over the next 60 days.    Noreene Larsson RN, MSN, [facility] Mobile: [phone]

## 2019-12-19 NOTE — Patient Instructions (Signed)
Visit Information  Goals Addressed            This Visit's Progress    RNCM: Pts daughter: "Hospice has been a big help for [place]- thank you" (pt-stated)       [place] (see longtitudinal plan of care for additional care plan information)  Current Barriers:   Knowledge Deficits related to recent evaluation by Hospice and the new Hospice organization is working with the patient.  The daughter is very thankful for this.  She is still uncertain of everything they can do but is thankful for the support.   Film/video editor.   Cognitive Deficits  Nurse Case Manager Clinical Goal(s):   Over the next 90 days, patient will verbalize understanding of plan for New Hospice referral through [facility] in [place], [place]- Completed. The patient has been accepted and Hospice is actively working with the patient and family. Goal completed  Over the next 120 days, patient will work with [place], RNCM and pcp to address needs related to in home care and assistance   Over the next 120 days, patient will demonstrate a decrease in sundowning and falls exacerbations as evidenced by patients symptoms being effectively managed  Interventions:   Advised patient to try to use activities like coloring sheets with colored pencils or crayons to help the patient during wandering phases.  The patients daughter states they are using puzzles to help distract her and have this set up for her even at night in case she gets up in the middle of the night.  Provided education to patient re: safety precautions to prevent falls.  The daughter is using non-skid socks.  The patient does not always use her walker when ambulating. The daughter reminds her frequently of using the walker. The patient is high fall risk. The daughter did check on the gps system but this only works up to 300 feet and the daughter does not feel this is beneficial for her mother at this time. They are  using a double key dead bolt that has to have a key to open on both sides. They have stopped using the double key dead bolt because this was making the patient very upset and not working. The patient now can access the other parts of the house but per the daughter she feels the dementia and sun downing are getting worse.   Reviewed medications with patients daughter and discussed : The daughter wanted to know if the patients glantamine could be increased. Secure chat sent to [name], NP. Jolene referred the patients daughter to neurology for recommendations. This medication has been stopped. The daughter feels like it was not working and it was expensive. She talked to the Hospice nurse about it and there was no indication that it was helping the patient.  The daughter made the decision to stop this medication.   Collaborated with Roselyn Meier, Hospice Care Consultant regarding denied hospice admit last week. Melissa took down needed information and will have her staff assess and work with the patient. This is the [facility] and Hospice located at [address], [place], [place], [place] [address].  Melissa can be reached at [phone]. Verbal order given by [name] for referral for hospice services. Completed.  The patients daughter is very thankful for Hospice coming in and serving the patient and the family.   Discussed plans with patient for ongoing care management follow up and provided patient with direct contact information  for care management team  Reviewed scheduled/upcoming provider appointments including: the patient was to have a visit with GI MD due to hemoglobin being low; however the family has decided it was not a good idea to put the patient through this.   Review of current concerns. The patients daughter states that the patient had a bad night.  The patient's dementia is worse and the sundowning is worse. The patient often is going to sleep around 4 pm and  sleeping a couple of hours and thinks she has been asleep all night. The patient's daughter states the patients blood pressure was 78/48 this am with a pulse of 138.  Advised the patients daughter that she was likely dehydrated. Education on pinching skin on the back of the hand to see if the skin tents. The daughter was going to do this. They have gotten the patient to drink and eat and the blood pressure is now 111/55 and HR 77. Education on low blood pressure and the increased risk of light headedness, dizziness, and falls.   Secure chat message sent to [facility] to provide current information and update.  Advised the daughter to call Hospice and have a nurse come out to evaluate the patient. The daughter verbalized understanding.   Evaluation of medication compliance.  The daughter is having a hard time getting the patient to take medications at times. Ask the daughter to try to put the pills in applesauce and see if the patient would take the medications that way. The daughter is going to try this when she can not get her to take the pills.  Evaluation of nutritional status. The patient is not eating or drinking a lot. Ask about Ensure supplements. The daughter has not tried this. Will supply Ensure samples of strawberry and vanilla at the next MD visit on 12-31-2019.   Patient Self Care Activities:   Patient verbalizes understanding of plan to follow up with Hospice referral and other resources to help with advancing dementia/Alzheimer's   Self administers medications as prescribed  Calls provider office for new concerns or questions  Unable to independently manage care in the home setting   Unable to self administer medications as prescribed  Unable to perform ADLs independently  Unable to perform IADLs independently  Please see past updates related to this goal by clicking on the "Past Updates" button in the selected goal       RNCM: Pts daughter:"My niece is trying to do physical  therpay with her but it is not workingProgrammer, systems ENTRY (see longtitudinal plan of care for additional care plan information)  Current Barriers:   Chronic Disease Management support, education, and care coordination needs related to Atrial Fibrillation, CHF, HTN, and Dementia  Clinical Goal(s) related to Atrial Fibrillation, CHF, HTN, and Dementia:  Over the next 120 days, patient will:   Work with the care management team to address educational, disease management, and care coordination needs   Begin or continue self health monitoring activities as directed today Measure and record blood pressure 1 times per week and encourage activity as tolerated  Call provider office for new or worsened signs and symptoms Blood pressure findings outside established parameters and New or worsened symptom related to heart disease, dementia and new issues related to incontinence   Call care management team with questions or concerns  Verbalize basic understanding of patient centered plan of care established today  Interventions related to Atrial Fibrillation, CHF, HTN, and  Dementia:   Evaluation of current treatment plans and patient's adherence to plan as established by provider  Assessed patient understanding of disease states  Assessed patient's education and care coordination needs.  The patient's daughter states her niece is trying to do physical therapy with the patient but the patient is so weak. Discussed concerns. Empathetic listening. The patients daughter feels her hemoglobin is low due to a bleed but feel the risk of involving GI versus the benefits would not be worth putting the patient through this. They want to keep the patient in her familiar environment where she feels safe.   Provided disease specific education to patient.  Education on the signs and symptoms of worsening dementia. The patient's daughter feels the patients dementia and sun downers is getting worse. The patients  daughter states the patient had an "Extreme bowel issue" last night and there was stool "everywhere".  The daughter says she is going to call Hospice and she also has her caregiver there this am. The patient's blood pressure has been low and they have been getting her to drink water and the blood pressure has improved. Discussed safety concerns with the patients daughter.   Collaborated with appropriate clinical care team members regarding patient needs  Patient Self Care Activities related to Atrial Fibrillation, CHF, HTN, and Dementia:   Patient is unable to independently self-manage chronic health conditions  Please see past updates related to this goal by clicking on the "Past Updates" button in the selected goal         Patient verbalizes understanding of instructions provided today.   The care management team will reach out to the patient again over the next 30 to 60 days.   Noreene Larsson RN, MSN, [facility] Mobile: [phone]

## 2019-12-19 NOTE — Telephone Encounter (Signed)
Refill refused, requested too soon. Last filled on 09/09/19 #90 with 1 refill.

## 2019-12-23 ENCOUNTER — Ambulatory Visit (INDEPENDENT_AMBULATORY_CARE_PROVIDER_SITE_OTHER): Payer: Medicare Other | Admitting: *Deleted

## 2019-12-23 DIAGNOSIS — I420 Dilated cardiomyopathy: Secondary | ICD-10-CM

## 2019-12-23 LAB — CUP PACEART REMOTE DEVICE CHECK
Battery Remaining Longevity: 90 mo
Battery Voltage: 3 V
Brady Statistic AP VP Percent: 0 %
Brady Statistic AP VS Percent: 0 %
Brady Statistic AS VP Percent: 0 %
Brady Statistic AS VS Percent: 0 %
Brady Statistic RA Percent Paced: 0 %
Brady Statistic RV Percent Paced: 19.07 %
Date Time Interrogation Session: 20210427002203
HighPow Impedance: 46 Ohm
HighPow Impedance: 59 Ohm
Implantable Lead Implant Date: 20040220
Implantable Lead Implant Date: 20070426
Implantable Lead Implant Date: 20070511
Implantable Lead Location: 753859
Implantable Lead Location: 753860
Implantable Lead Location: 753860
Implantable Lead Model: 5076
Implantable Lead Model: 5092
Implantable Lead Model: 6949
Implantable Pulse Generator Implant Date: 20180411
Lead Channel Impedance Value: 380 Ohm
Lead Channel Impedance Value: 399 Ohm
Lead Channel Impedance Value: 456 Ohm
Lead Channel Pacing Threshold Amplitude: 0.75 V
Lead Channel Pacing Threshold Pulse Width: 0.4 ms
Lead Channel Sensing Intrinsic Amplitude: 0.375 mV
Lead Channel Sensing Intrinsic Amplitude: 0.75 mV
Lead Channel Sensing Intrinsic Amplitude: 1.625 mV
Lead Channel Sensing Intrinsic Amplitude: 1.625 mV
Lead Channel Setting Pacing Amplitude: 2.5 V
Lead Channel Setting Pacing Pulse Width: 0.4 ms
Lead Channel Setting Sensing Sensitivity: 0.3 mV

## 2019-12-23 NOTE — Progress Notes (Signed)
ICD Remote  

## 2019-12-24 ENCOUNTER — Ambulatory Visit (INDEPENDENT_AMBULATORY_CARE_PROVIDER_SITE_OTHER): Payer: Medicare Other

## 2019-12-24 DIAGNOSIS — Z95810 Presence of automatic (implantable) cardiac defibrillator: Secondary | ICD-10-CM

## 2019-12-24 DIAGNOSIS — I5022 Chronic systolic (congestive) heart failure: Secondary | ICD-10-CM

## 2019-12-26 ENCOUNTER — Telehealth: Payer: Self-pay

## 2019-12-26 NOTE — Progress Notes (Signed)
Call back to patient and advised device clinic would call her today.  She said Amy has already called today and she gave Amy the hospice nurse number.  She was appreciative of all the care her mother has received.  She requested the August appointment with Dr Caryl Comes be canceled at this time.  Advised will send a message to cancel the appointment.  Explained no further monthly ICM follow up is needed since patient is in hospice but she is welcome to call if has any further questions.

## 2019-12-26 NOTE — Telephone Encounter (Signed)
Spoke with pt daughter, she states that pt has had a decline and the family feels it is time to disable ICD therapy.  She understands that once disabled the ICD will not treat lethal arrhythmias.  Explained process for disabling therapy includes coordinating with Hospice care to obtain MD order and arrange industry to visit home and disable therapy.    I spoke with Joelene Millin, RN for [facility] and Hospice.  She will work on getting an MD order.  Fax number provided for her to send MD order.    Email message sent to [employer] rep with Hospice nurse information

## 2019-12-26 NOTE — Progress Notes (Signed)
EPIC Encounter for ICM Monitoring  Patient Name: Barbara Blackburn is a 86 y.o. female Date: 12/26/2019 Primary Care Physican: Venita Lick, NP Primary Cardiologist:Gollan Electrophysiologist:Klein 2/17/2021Weight: [id number]    Spoke with daughter Barbara Blackburn. She reports patient is declining rapidly and unable to walk at this time.  She would like to have the defibrillator setting turned off since patient is in hospice.          Phone note sent to device triage regarding daughters request.  OptivolThoracic impedancenormal.  Prescribed: Furosemide40 mgTake 3 tablets (120 mg) by mouth once every other day in the morning.   Labs: 10/14/2019 Creatinine 0.96, BUN 26, Potassium 4.2, Sodium 145, GFR 53-61 01/16/2019 Creatinine 0.89, BUN 20, Potassium 4.2, Sodium 141, GFR 58-67 11/07/2018 Creatinine0.78, BUN23, Potassium3.9, [id number], [id number] A complete set of results can be found in Results Review.  Recommendations:Encouraged to call if experiencing fluid symptoms.  Follow-up plan: Advised daughter no further ICM follow up due to patient is in hospice.  91 day remote monitoring will continue.   Copy of ICM check sent to [place].  3 month ICM trend: 12/23/2019    1 Year ICM trend:       Barbara Billings, RN 12/26/2019 10:31 AM

## 2019-12-26 NOTE — Telephone Encounter (Addendum)
Received call from [name], [place].  She reports patient is in hospice at home and is rapidly declining.  Patient is unable to walk at this time.  She wants to have defibrillator turned off and patient has a living will that supports that per daughter.  Will forward phone note to device triage.

## 2019-12-31 ENCOUNTER — Encounter: Payer: Self-pay | Admitting: Nurse Practitioner

## 2019-12-31 ENCOUNTER — Other Ambulatory Visit: Payer: Self-pay

## 2019-12-31 ENCOUNTER — Ambulatory Visit (INDEPENDENT_AMBULATORY_CARE_PROVIDER_SITE_OTHER): Payer: Medicare Other | Admitting: Nurse Practitioner

## 2019-12-31 DIAGNOSIS — F0280 Dementia in other diseases classified elsewhere without behavioral disturbance: Secondary | ICD-10-CM | POA: Diagnosis not present

## 2019-12-31 DIAGNOSIS — G301 Alzheimer's disease with late onset: Secondary | ICD-10-CM | POA: Diagnosis not present

## 2019-12-31 NOTE — Patient Instructions (Signed)
Dementia Caregiver Guide Dementia is a term used to describe a number of symptoms that affect memory and thinking. The most common symptoms include:  Memory loss.  Trouble with language and communication.  Trouble concentrating.  Poor judgment.  Problems with reasoning.  Child-like behavior and language.  Extreme anxiety.  Angry outbursts.  Wandering from home or public places. Dementia usually gets worse slowly over time. In the early stages, people with dementia can stay independent and safe with some help. In later stages, they need help with daily tasks such as dressing, grooming, and using the bathroom. How to help the person with dementia cope Dementia can be frightening and confusing. Here are some tips to help the person with dementia cope with changes caused by the disease. General tips  Keep the person on track with his or her routine.  Try to identify areas where the person may need help.  Be supportive, patient, calm, and encouraging.  Gently remind the person that adjusting to changes takes time.  Help with the tasks that the person has asked for help with.  Keep the person involved in daily tasks and decisions as much as possible.  Encourage conversation, but try not to get frustrated or harried if the person struggles to find words or does not seem to appreciate your help. Communication tips  When the person is talking or seems frustrated, make eye contact and hold the person's hand.  Ask specific questions that need yes or no answers.  Use simple words, short sentences, and a calm voice. Only give one direction at a time.  When offering choices, limit them to just 1 or 2.  Avoid correcting the person in a negative way.  If the person is struggling to find the right words, gently try to help him or her. How to recognize symptoms of stress Symptoms of stress in caregivers include:  Feeling frustrated or angry with the person with  dementia.  Denying that the person has dementia or that his or her symptoms will not improve.  Feeling hopeless and unappreciated.  Difficulty sleeping.  Difficulty concentrating.  Feeling anxious, irritable, or depressed.  Developing stress-related health problems.  Feeling like you have too little time for your own life. Follow these instructions at home:   Make sure that you and the person you are caring for: ? Get regular sleep. ? Exercise regularly. ? Eat regular, nutritious meals. ? Drink enough fluid to keep your urine clear or pale yellow. ? Take over-the-counter and prescription medicines only as told by your health care providers. ? Attend all scheduled health care appointments.  Join a support group with others who are caregivers.  Ask about respite care resources so that you can have a regular break from the stress of caregiving.  Look for signs of stress in yourself and in the person you are caring for. If you notice signs of stress, take steps to manage it.  Consider any safety risks and take steps to avoid them.  Organize medications in a pill box for each day of the week.  Create a plan to handle any legal or financial matters. Get legal or financial advice if needed.  Keep a calendar in a central location to remind the person of appointments or other activities. Tips for reducing the risk of injury  Keep floors clear of clutter. Remove rugs, magazine racks, and floor lamps.  Keep hallways well lit, especially at night.  Put a handrail and nonslip mat in the bathtub or   shower.  Put childproof locks on cabinets that contain dangerous items, such as medicines, alcohol, guns, toxic cleaning items, sharp tools or utensils, matches, and lighters.  Put the locks in places where the person cannot see or reach them easily. This will help ensure that the person does not wander out of the house and get lost.  Be prepared for emergencies. Keep a list of  emergency phone numbers and addresses in a convenient area.  Remove car keys and lock garage doors so that the person does not try to get in the car and drive.  Have the person wear a bracelet that tracks locations and identifies the person as having memory problems. This should be worn at all times for safety. Where to find support: Many individuals and organizations offer support. These include:  Support groups for people with dementia and for caregivers.  Counselors or therapists.  Home health care services.  Adult day care centers. Where to find more information Alzheimer's Association: www.alz.org Contact a health care provider if:  The person's health is rapidly getting worse.  You are no longer able to care for the person.  Caring for the person is affecting your physical and emotional health.  The person threatens himself or herself, you, or anyone else. Summary  Dementia is a term used to describe a number of symptoms that affect memory and thinking.  Dementia usually gets worse slowly over time.  Take steps to reduce the person's risk of injury, and to plan for future care.  Caregivers need support, relief from caregiving, and time for their own lives. This information is not intended to replace advice given to you by your health care provider. Make sure you discuss any questions you have with your health care provider. Document Revised: 07/27/2017 Document Reviewed: 07/18/2016 Elsevier Patient Education  2020 Elsevier Inc.  

## 2019-12-31 NOTE — Assessment & Plan Note (Signed)
Chronic, progressive with decline.  Hospice care involved.  Continue current medication regimen and discussed with family continue medication reductions as needed based.  Continue Sertraline 100 MG and continue Ativan and Trazodone as prescribed by hospice.  Collaborate with CCM team for caregiver support + collaborate with hospice services.  Return in 6 months.  Daughter will keep provider updated on patient health during this time.  Offered support.

## 2019-12-31 NOTE — Progress Notes (Signed)
BP 129/76   Pulse 64   Temp 98.8 F (37.1 C) (Oral)   Wt 170 lb (77.1 kg)   LMP  (LMP Unknown)   SpO2 91%   BMI 32.12 kg/m    Subjective:    Patient ID: Barbara Blackburn, female    DOB: 26-Aug-1929, 86 y.o.   MRN: [id number]  HPI: Barbara Blackburn is a 86 y.o. female  Chief Complaint  Patient presents with  . Alzheimer's Disease  . Urinary Incontinence    pt's daughter states the patient is starting to have incontinence issues    Daughter's at bedside to assist with HPI.  ALZHEIMER'S DEMENTIA: Chronic, progressive in nature. Saw neurology last 02/17/19, hospice took off Galantamine.The Namenda caused behaviors issues so they stopped this months back.Taking Zoloft 100 MG daily + hospice added Ativan and Trazodone at night for sleep as sun downing was becoming major issue. Patient and daughter deny N&V, CP, dizziness, syncope, headache, or decreased appetite at this time. Hospice is involved and visiting with patient, they have reported some decline in patient status.  Hospice and [facility] are coming today to cut the defib off per her daughter's report.  Patient continues to eat well at home per her daughter report.Has had some weight loss over past year.  Currently she has become incontinent of urine and they are working with hospice, using briefs at home.  Discussed at length stages of dementia and they are aware of progression.  Relevant past medical, surgical, family and social history reviewed and updated as indicated. Interim medical history since our last visit reviewed. Allergies and medications reviewed and updated.  Review of Systems  Constitutional: Positive for fatigue. Negative for activity change, appetite change, diaphoresis and fever.  Respiratory: Negative for cough, chest tightness and wheezing.   Cardiovascular: Negative for chest pain, palpitations and leg swelling.  Gastrointestinal: Negative for abdominal distention, abdominal pain, blood in stool,  constipation, diarrhea, nausea, rectal pain and vomiting.  Endocrine: Negative for cold intolerance and heat intolerance.  Neurological: Positive for weakness. Negative for light-headedness and headaches.  Psychiatric/Behavioral: Negative.     Per HPI unless specifically indicated above     Objective:    BP 129/76   Pulse 64   Temp 98.8 F (37.1 C) (Oral)   Wt 170 lb (77.1 kg)   LMP  (LMP Unknown)   SpO2 91%   BMI 32.12 kg/m   Wt Readings from Last 3 Encounters:  12/31/19 170 lb (77.1 kg)  10/30/19 175 lb 6 oz (79.5 kg)  10/29/19 175 lb 9.6 oz (79.7 kg)    Physical Exam Vitals and nursing note reviewed.  Constitutional:      General: She is awake. She is not in acute distress.    Appearance: She is well-developed, well-groomed and overweight. She is not ill-appearing.  HENT:     Head: Normocephalic.     Right Ear: Hearing normal.     Left Ear: Hearing normal.  Eyes:     General: Lids are normal.        Right eye: No discharge.        Left eye: No discharge.     Conjunctiva/sclera: Conjunctivae normal.     Pupils: Pupils are equal, round, and reactive to light.  Cardiovascular:     Rate and Rhythm: Normal rate and regular rhythm.     Heart sounds: Normal heart sounds. No murmur. No gallop.   Pulmonary:     Effort: Pulmonary effort is normal. No  accessory muscle usage or respiratory distress.     Breath sounds: Normal breath sounds.  Abdominal:     General: Bowel sounds are normal.     Palpations: Abdomen is soft.  Musculoskeletal:     Cervical back: Normal range of motion and neck supple.     Right lower leg: No edema.     Left lower leg: No edema.  Skin:    General: Skin is warm and dry.  Neurological:     Mental Status: She is alert.     Comments: Pleasantly confused  Psychiatric:        Attention and Perception: Attention normal.        Mood and Affect: Mood normal.        Speech: Speech normal.        Behavior: Behavior normal. Behavior is cooperative.     Results for orders placed or performed in visit on 12/23/19  CUP Zinc  Result Value Ref Range   Date Time Interrogation Session [id number]    Pulse Generator Manufacturer MERM    Pulse Gen Model [id number] Evera XT DR    Pulse Gen Serial Number [id number]    Clinic Name [facility]    Implantable Pulse Generator Type Implantable Cardiac Defibulator    Implantable Pulse Generator Implant Date HC:[id number]    Implantable Lead Manufacturer MERM    Implantable Lead Model 5076 CapSureFix Novus    Implantable Lead Serial Number [id number]    Implantable Lead Implant Date KF:[id number]    Implantable Lead Location Detail 1 APPENDAGE    Implantable Lead Location [id number]    Implantable Lead Manufacturer [facility]    Implantable Lead Model [id number] Sprint Fidelis    Implantable Lead Serial Number [id number]    Implantable Lead Implant Date AK:3695378    Implantable Lead Location Detail 1 APEX    Implantable Lead Location [id number]    Implantable Lead Manufacturer MERM    Implantable Lead Model [id number] CapSure SP Novus    Implantable Lead Serial Number [id number]    Implantable Lead Implant Date VO:6580032    Implantable Lead Location [phone]    Lead Channel Setting Sensing Sensitivity 0.3 mV   Lead Channel Setting Pacing Pulse Width 0.4 ms   Lead Channel Setting Pacing Amplitude 2.5 V   Lead Channel Impedance Value 399 ohm   Lead Channel Sensing Intrinsic Amplitude 0.75 mV   Lead Channel Sensing Intrinsic Amplitude 0.375 mV   Lead Channel Impedance Value 456 ohm   Lead Channel Impedance Value 380 ohm   Lead Channel Sensing Intrinsic Amplitude 1.625 mV   Lead Channel Sensing Intrinsic Amplitude 1.625 mV   Lead Channel Pacing Threshold Amplitude 0.75 V   Lead Channel Pacing Threshold Pulse Width 0.4 ms   HighPow Impedance 46 ohm   HighPow Impedance 59 ohm   Battery Status OK    Battery Remaining Longevity 90 mo   Battery Voltage 3 V   Brady Statistic RA Percent Paced 0 %   Brady  Statistic RV Percent Paced 19.07 %   Brady Statistic AP VP Percent 0 %   Brady Statistic AS VP Percent 0 %   Brady Statistic AP VS Percent 0 %   Brady Statistic AS VS Percent 0 %      Assessment & Plan:   Problem List Items Addressed This Visit      Nervous and Auditory   Dementia (HCC)    Chronic, progressive with decline.  Hospice care involved.  Continue current  medication regimen and discussed with family continue medication reductions as needed based.  Continue Sertraline 100 MG and continue Ativan and Trazodone as prescribed by hospice.  Collaborate with CCM team for caregiver support + collaborate with hospice services.  Return in 6 months.  Daughter will keep provider updated on patient health during this time.  Offered support.      Relevant Medications   LORazepam (ATIVAN) 0.5 MG tablet   traZODone (DESYREL) 50 MG tablet       Follow up plan: Return in about 6 months (around 07/02/2020) for Dementia.

## 2020-02-02 ENCOUNTER — Other Ambulatory Visit: Payer: Self-pay | Admitting: Nurse Practitioner

## 2020-02-13 ENCOUNTER — Ambulatory Visit: Payer: Self-pay | Admitting: General Practice

## 2020-02-13 ENCOUNTER — Telehealth: Payer: Self-pay

## 2020-02-13 NOTE — Chronic Care Management (AMB) (Signed)
  Care Management   Note  02/13/2020 Name: Barbara Blackburn MRN: [id number] DOB: [birth year]    Follow up outreach scheduled today. The front office staff notified the [facility] of the family's request to cancel appointments for the patient as she was transitioning.  Reached out to the patients, daughterMargarita Grizzle.  [name] was appreciative of all the help from the CCM team and the [facility] assisting with getting the patient connected with Hospice.  The daughter expressed they have everything they need and are just waiting now.  Emotional support provided.  The daughter will let the office know when the patient has passed.   No further follow up required: the patients family notified the office that the patient was transitioning and would like for all appointments to be cancelled.    Noreene Larsson RN, MSN, [facility] Mobile: [phone]

## 2020-02-16 ENCOUNTER — Other Ambulatory Visit: Payer: Self-pay | Admitting: Nurse Practitioner

## 2020-02-16 NOTE — Telephone Encounter (Signed)
Routing to provider  

## 2020-02-16 NOTE — Telephone Encounter (Signed)
Requested medication (s) are due for refill today: yes  Requested medication (s) are on the active medication list: yes  Last refill:  07/15/19  Future visit scheduled: yes  Notes to clinic:  medication not assigned to a protocol   Requested Prescriptions  Pending Prescriptions Disp Refills   nystatin cream (MYCOSTATIN) [Pharmacy Med Name: NYSTATIN 100,000 UNIT/GM CREAM] 30 g 0    Sig: Apply 1 application topically 2 (two) times daily.      Off-Protocol Failed - 02/16/2020 11:03 AM      Failed - Medication not assigned to a protocol, review manually.      Passed - Valid encounter within last 12 months    Recent Outpatient Visits           1 month ago Late onset Alzheimer's disease without behavioral disturbance ([place])   [place], [place] T, NP   2 months ago Late onset Alzheimer's disease without behavioral disturbance ([place])   [place] Cannady, Henrine Screws T, NP   3 months ago Low hemoglobin   [place], Lorenzo T, NP   3 months ago Late onset Alzheimer's disease without behavioral disturbance ([place])   [place] Cannady, Jolene T, NP   4 months ago Late onset Alzheimer's disease without behavioral disturbance ([place])   [place], [name], NP       Future Appointments             In 4 months Cannady, [name], NP [employer]

## 2020-02-23 ENCOUNTER — Telehealth: Payer: Self-pay

## 2020-02-25 ENCOUNTER — Telehealth: Payer: Self-pay | Admitting: Nurse Practitioner

## 2020-02-25 NOTE — Telephone Encounter (Signed)
Pt's death certificate was placed in bin to be completed and signed.

## 2020-02-25 NOTE — Telephone Encounter (Signed)
Death Certificate complete and signed by Jolene. Called and informed funeral home that it was ready to be picked up.

## 2020-02-25 NOTE — Telephone Encounter (Signed)
Certificate given to [facility] to complete.

## 2020-02-26 DEATH — deceased

## 2020-03-26 ENCOUNTER — Telehealth: Payer: Self-pay

## 2020-04-13 ENCOUNTER — Encounter: Payer: Medicare Other | Admitting: Internal Medicine

## 2020-07-14 ENCOUNTER — Ambulatory Visit: Payer: Medicare Other | Admitting: Nurse Practitioner
